# Patient Record
Sex: Female | Born: 1970 | Race: White | Hispanic: No | Marital: Married | Smoking: Current every day smoker
Health system: Southern US, Community
[De-identification: ages and names within clinical notes are randomized; demographics above are authoritative.]

## PROBLEM LIST (undated history)

## (undated) DIAGNOSIS — E119 Type 2 diabetes mellitus without complications: Secondary | ICD-10-CM

## (undated) DIAGNOSIS — G4730 Sleep apnea, unspecified: Secondary | ICD-10-CM

## (undated) DIAGNOSIS — M1990 Unspecified osteoarthritis, unspecified site: Secondary | ICD-10-CM

## (undated) DIAGNOSIS — M797 Fibromyalgia: Secondary | ICD-10-CM

## (undated) DIAGNOSIS — C9210 Chronic myeloid leukemia, BCR/ABL-positive, not having achieved remission: Principal | ICD-10-CM

## (undated) DIAGNOSIS — R0600 Dyspnea, unspecified: Secondary | ICD-10-CM

## (undated) DIAGNOSIS — G709 Myoneural disorder, unspecified: Secondary | ICD-10-CM

## (undated) DIAGNOSIS — Z8719 Personal history of other diseases of the digestive system: Secondary | ICD-10-CM

## (undated) DIAGNOSIS — D649 Anemia, unspecified: Secondary | ICD-10-CM

## (undated) DIAGNOSIS — K219 Gastro-esophageal reflux disease without esophagitis: Secondary | ICD-10-CM

## (undated) DIAGNOSIS — T7840XA Allergy, unspecified, initial encounter: Secondary | ICD-10-CM

## (undated) DIAGNOSIS — F419 Anxiety disorder, unspecified: Secondary | ICD-10-CM

## (undated) DIAGNOSIS — Z87442 Personal history of urinary calculi: Secondary | ICD-10-CM

## (undated) DIAGNOSIS — F32A Depression, unspecified: Secondary | ICD-10-CM

## (undated) DIAGNOSIS — J45909 Unspecified asthma, uncomplicated: Secondary | ICD-10-CM

## (undated) DIAGNOSIS — J449 Chronic obstructive pulmonary disease, unspecified: Secondary | ICD-10-CM

## (undated) DIAGNOSIS — J189 Pneumonia, unspecified organism: Secondary | ICD-10-CM

## (undated) DIAGNOSIS — F329 Major depressive disorder, single episode, unspecified: Secondary | ICD-10-CM

## (undated) DIAGNOSIS — E785 Hyperlipidemia, unspecified: Secondary | ICD-10-CM

## (undated) HISTORY — DX: Unspecified osteoarthritis, unspecified site: M19.90

## (undated) HISTORY — DX: Myoneural disorder, unspecified: G70.9

## (undated) HISTORY — DX: Chronic myeloid leukemia, BCR/ABL-positive, not having achieved remission: C92.10

## (undated) HISTORY — DX: Chronic obstructive pulmonary disease, unspecified: J44.9

## (undated) HISTORY — DX: Unspecified asthma, uncomplicated: J45.909

## (undated) HISTORY — PX: BACK SURGERY: SHX140

## (undated) HISTORY — DX: Allergy, unspecified, initial encounter: T78.40XA

## (undated) HISTORY — DX: Hyperlipidemia, unspecified: E78.5

## (undated) HISTORY — DX: Type 2 diabetes mellitus without complications: E11.9

---
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
## 2003-03-02 HISTORY — PX: TUBAL LIGATION: SHX77

## 2006-03-01 HISTORY — PX: CARPAL TUNNEL RELEASE: SHX101

## 2009-04-23 DIAGNOSIS — G4733 Obstructive sleep apnea (adult) (pediatric): Secondary | ICD-10-CM

## 2009-04-23 DIAGNOSIS — Z9989 Dependence on other enabling machines and devices: Secondary | ICD-10-CM

## 2010-12-23 DIAGNOSIS — I10 Essential (primary) hypertension: Secondary | ICD-10-CM | POA: Insufficient documentation

## 2011-02-04 DIAGNOSIS — Z87891 Personal history of nicotine dependence: Secondary | ICD-10-CM | POA: Insufficient documentation

## 2011-02-04 DIAGNOSIS — G4733 Obstructive sleep apnea (adult) (pediatric): Secondary | ICD-10-CM | POA: Insufficient documentation

## 2011-03-28 DIAGNOSIS — K589 Irritable bowel syndrome without diarrhea: Secondary | ICD-10-CM | POA: Insufficient documentation

## 2012-03-04 ENCOUNTER — Encounter: Payer: Self-pay | Admitting: Emergency Medicine

## 2012-09-18 DIAGNOSIS — G990 Autonomic neuropathy in diseases classified elsewhere: Secondary | ICD-10-CM | POA: Insufficient documentation

## 2013-01-12 LAB — HM PAP SMEAR: HM Pap smear: NEGATIVE

## 2013-03-01 HISTORY — PX: KIDNEY STONE SURGERY: SHX686

## 2013-05-08 DIAGNOSIS — J449 Chronic obstructive pulmonary disease, unspecified: Secondary | ICD-10-CM | POA: Insufficient documentation

## 2013-10-07 DIAGNOSIS — M5126 Other intervertebral disc displacement, lumbar region: Secondary | ICD-10-CM | POA: Insufficient documentation

## 2013-12-08 DIAGNOSIS — N201 Calculus of ureter: Secondary | ICD-10-CM | POA: Insufficient documentation

## 2014-01-13 LAB — HM MAMMOGRAPHY

## 2014-02-01 DIAGNOSIS — E538 Deficiency of other specified B group vitamins: Secondary | ICD-10-CM | POA: Insufficient documentation

## 2014-02-01 DIAGNOSIS — F17210 Nicotine dependence, cigarettes, uncomplicated: Secondary | ICD-10-CM | POA: Insufficient documentation

## 2014-03-19 DIAGNOSIS — M5116 Intervertebral disc disorders with radiculopathy, lumbar region: Secondary | ICD-10-CM | POA: Insufficient documentation

## 2015-07-23 DIAGNOSIS — B009 Herpesviral infection, unspecified: Secondary | ICD-10-CM | POA: Insufficient documentation

## 2015-12-16 ENCOUNTER — Ambulatory Visit: Payer: Self-pay | Admitting: Osteopathic Medicine

## 2015-12-24 ENCOUNTER — Ambulatory Visit (INDEPENDENT_AMBULATORY_CARE_PROVIDER_SITE_OTHER): Payer: 59 | Admitting: Osteopathic Medicine

## 2015-12-24 ENCOUNTER — Encounter: Payer: Self-pay | Admitting: Osteopathic Medicine

## 2015-12-24 VITALS — BP 135/83 | HR 84 | Ht 65.0 in | Wt 261.0 lb

## 2015-12-24 DIAGNOSIS — L509 Urticaria, unspecified: Secondary | ICD-10-CM | POA: Diagnosis not present

## 2015-12-24 DIAGNOSIS — R05 Cough: Secondary | ICD-10-CM | POA: Insufficient documentation

## 2015-12-24 DIAGNOSIS — L299 Pruritus, unspecified: Secondary | ICD-10-CM | POA: Insufficient documentation

## 2015-12-24 DIAGNOSIS — J45909 Unspecified asthma, uncomplicated: Secondary | ICD-10-CM | POA: Diagnosis not present

## 2015-12-24 DIAGNOSIS — R059 Cough, unspecified: Secondary | ICD-10-CM

## 2015-12-24 DIAGNOSIS — J322 Chronic ethmoidal sinusitis: Secondary | ICD-10-CM | POA: Insufficient documentation

## 2015-12-24 DIAGNOSIS — B379 Candidiasis, unspecified: Secondary | ICD-10-CM | POA: Insufficient documentation

## 2015-12-24 DIAGNOSIS — B9689 Other specified bacterial agents as the cause of diseases classified elsewhere: Secondary | ICD-10-CM

## 2015-12-24 DIAGNOSIS — E119 Type 2 diabetes mellitus without complications: Secondary | ICD-10-CM | POA: Diagnosis not present

## 2015-12-24 DIAGNOSIS — J329 Chronic sinusitis, unspecified: Secondary | ICD-10-CM

## 2015-12-24 DIAGNOSIS — R053 Chronic cough: Secondary | ICD-10-CM | POA: Insufficient documentation

## 2015-12-24 DIAGNOSIS — R350 Frequency of micturition: Secondary | ICD-10-CM | POA: Insufficient documentation

## 2015-12-24 DIAGNOSIS — E1165 Type 2 diabetes mellitus with hyperglycemia: Secondary | ICD-10-CM | POA: Insufficient documentation

## 2015-12-24 LAB — CBC WITH DIFFERENTIAL/PLATELET
BASOS ABS: 116 {cells}/uL (ref 0–200)
Basophils Relative: 1 %
EOS ABS: 116 {cells}/uL (ref 15–500)
Eosinophils Relative: 1 %
HEMATOCRIT: 43.2 % (ref 35.0–45.0)
HEMOGLOBIN: 13.4 g/dL (ref 11.7–15.5)
LYMPHS ABS: 1972 {cells}/uL (ref 850–3900)
LYMPHS PCT: 17 %
MCH: 27.4 pg (ref 27.0–33.0)
MCHC: 31 g/dL — AB (ref 32.0–36.0)
MCV: 88.3 fL (ref 80.0–100.0)
MONO ABS: 348 {cells}/uL (ref 200–950)
MPV: 8.9 fL (ref 7.5–12.5)
Monocytes Relative: 3 %
NEUTROS PCT: 78 %
Neutro Abs: 9048 cells/uL — ABNORMAL HIGH (ref 1500–7800)
Platelets: 243 10*3/uL (ref 140–400)
RBC: 4.89 MIL/uL (ref 3.80–5.10)
RDW: 15.5 % — AB (ref 11.0–15.0)
WBC: 11.6 10*3/uL — ABNORMAL HIGH (ref 3.8–10.8)

## 2015-12-24 LAB — POCT GLYCOSYLATED HEMOGLOBIN (HGB A1C): HEMOGLOBIN A1C: 9.5

## 2015-12-24 MED ORDER — CETIRIZINE HCL 10 MG PO TABS: 10.0000 mg | ORAL_TABLET | Freq: Every day | ORAL | 6 refills | Status: DC

## 2015-12-24 MED ORDER — AMOXICILLIN-POT CLAVULANATE 875-125 MG PO TABS: 1.0000 | ORAL_TABLET | Freq: Two times a day (BID) | ORAL | 0 refills | Status: DC

## 2015-12-24 MED ORDER — FLUCONAZOLE 150 MG PO TABS: 150.0000 mg | ORAL_TABLET | Freq: Once | ORAL | 1 refills | Status: AC

## 2015-12-24 MED ORDER — ALBUTEROL SULFATE HFA 108 (90 BASE) MCG/ACT IN AERS: 1.0000 | INHALATION_SPRAY | RESPIRATORY_TRACT | 12 refills | Status: AC | PRN

## 2015-12-24 NOTE — Progress Notes (Signed)
HPI: Tabitha Estrada is a 45 y.o. female  who presents to [facility] today, 12/24/15,  for chief complaint of:  Chief Complaint  Patient presents with  . Establish Care    Skin itching: diffuse, no rash, intermittent. No triggers identified. Better in the car and has padding on her bed to keep cool which helps a bit When she is lying down at night. Benadryl helps, lotion and gold bond and cortisone don't help.   Sinus Congestion - since 08/2015, was mentioned to previous doctor. Was seen for UTI, given antibiotic to cover both thing (Levaquin). Nasonex was drying nose out. She'll use on occasion. No other systemic antihistamine  ?COPD vs asthma - seen by Pulmonologist last year, states that there was some questionable diagnosis of COPD versus asthma, patient has nebulized medication at home, unknown which medicine. Has albuterol inhaler which she uses on occasion, needs refill. Occasional SOB. Occasional wheezing and chest tightness. Pneumonia last year. Current smoker. 72 PY Hx. Declines chest x-ray today  Vaginal irritation/urinary frequency: Patient requests urine checked today as well as labs  Past medical, surgical, social and family history reviewed: Past Medical History:  Diagnosis Date  . Arthritis   . Asthma   . Diabetes mellitus without complication ([place])   . Hyperlipidemia    Past Surgical History:  Procedure Laterality Date  . BACK SURGERY  2015,2016  . CARPAL TUNNEL RELEASE Right 2008  . CHOLECYSTECTOMY  1992  . KIDNEY STONE SURGERY  2015  . TONSILLECTOMY  1979  . TUBAL LIGATION  2005   Social History  Substance Use Topics  . Smoking status: Current Every Day Smoker  . Smokeless tobacco: Never Used  . Alcohol use Not on file   Family History  Problem Relation Age of Onset  . Diabetes Father   . Hyperlipidemia Father   . Hypertension Father   . Diabetes Paternal Aunt   . Hypertension Paternal Aunt   . Heart attack  Paternal Uncle   . Emphysema Maternal Grandmother   . Heart failure Maternal Grandmother   . Cancer Maternal Grandfather      Current medication list and allergy/intolerance information reviewed:   Current Outpatient Prescriptions  Medication Sig Dispense Refill  . atorvastatin (LIPITOR) 40 MG tablet Take by mouth.    . Glucose Blood (BLOOD GLUCOSE TEST STRIPS) STRP 1 each by Other route 3 (three) times a day. Use as instructed    . Insulin Degludec 200 UNIT/ML SOPN Inject into the skin.    . Insulin Lispro 200 UNIT/ML SOPN Inject into the skin.    . Insulin Pen Needle 32G X 4 MM MISC by Does not apply route.    Marland Kitchen omeprazole (PRILOSEC) 20 MG capsule TAKE 1 CAPSULE ONCE DAILY    . valACYclovir (VALTREX) 500 MG tablet Take by mouth.    Marland Kitchen albuterol (PROVENTIL HFA;VENTOLIN HFA) 108 (90 Base) MCG/ACT inhaler Inhale 1-2 puffs into the lungs every 4 (four) hours as needed for wheezing or shortness of breath. 1 Inhaler 12  . amoxicillin-clavulanate (AUGMENTIN) 875-125 MG tablet Take 1 tablet by mouth 2 (two) times daily. For 7 days 14 tablet 0  . cetirizine (ZYRTEC) 10 MG tablet Take 1 tablet (10 mg total) by mouth daily. 30 tablet 6  . fluconazole (DIFLUCAN) 150 MG tablet Take 1 tablet (150 mg total) by mouth once. Repeat dose 72 hours if no better 2 tablet 1   No current facility-administered medications for this visit.  Allergies  Allergen Reactions  . Dulaglutide Other (See Comments)  . Sulfur Nausea And Vomiting  . Metformin And Related Diarrhea      Review of Systems:  Constitutional:  No  fever, no chills, No recent illness, No unintentional weight changes. No significant fatigue.   HEENT: +occasional headache, no vision change, no hearing change, No sore throat, +sinus pressure  Cardiac: No  chest pain, No  pressure, No palpitations, No  Orthopnea  Respiratory:  No  shortness of breath. +Cough  Gastrointestinal: No  abdominal pain, No  nausea, No  vomiting,  No  blood in  stool, No  diarrhea, No  constipation   Musculoskeletal: No new myalgia/arthralgia  Genitourinary: No  incontinence, No  abnormal genital bleeding, No abnormal genital discharge  Skin: No  Rash, No other wounds/concerning lesions  Hem/Onc: No  easy bruising/bleeding, No  abnormal lymph node  Endocrine: No cold intolerance,  No heat intolerance. No polyuria/polydipsia/polyphagia   Neurologic: No  weakness, No  dizziness, No  slurred speech/focal weakness/facial droop  Psychiatric: No  concerns with depression, No  concerns with anxiety, No sleep problems, No mood problems  Exam:  BP 135/83   Pulse 84   Ht 5\' 5"  (1.651 m)   Wt 261 lb (118.4 kg)   BMI 43.43 kg/m   Constitutional: VS see above. General Appearance: alert, well-developed, well-nourished, NAD  Eyes: Normal lids and conjunctive, non-icteric sclera  Ears, Nose, Mouth, Throat: MMM, Normal external inspection ears/nares/mouth/lips/gums. TM normal bilaterally. Pharynx/tonsils no erythema, no exudate. Nasal mucosa normal. No sinus tenderness to palpation or percussion.  Neck: No masses, trachea midline. No thyroid enlargement. No tenderness/mass appreciated. No lymphadenopathy  Respiratory: Normal respiratory effort. + Diffuse bilateral wheeze, no rhonchi, no rales  Cardiovascular: S1/S2 normal, no murmur, no rub/gallop auscultated. RRR. No lower extremity edema.   Musculoskeletal: Gait normal. No clubbing/cyanosis of digits.   Neurological: Normal balance/coordination. No tremor.   Skin: warm, dry, intact. No rash/ulcer.     Psychiatric: Normal judgment/insight. Normal mood and affect. Oriented x3.      ASSESSMENT/PLAN: Constellation of likely allergic/reactive airway issues but cannot rule out sinusitis versus allergic rhinitis, patient declines chest x-ray today but exam consistent with asthma, return for PFT and in the meantime albuterol. Patient reports no significant breathing change over the past week or 2 to  make a concern for an exacerbation, we'll hold off on prednisone for now given elevated blood sugars as well the patient is advised to call me if feeling worse. [facility] records from previous pulmonology visit for PFT, but we'll repeat PFTs here either way, given smoking history there is of course concern for COPD.  Cough - Plan: cetirizine (ZYRTEC) 10 MG tablet, CBC with Differential/Platelet, COMPLETE METABOLIC PANEL WITH GFR, TSH  Type 2 diabetes mellitus without complication, without long-term current use of insulin (HCC) - Plan: POCT HgB A1C  Urticaria - Plan: cetirizine (ZYRTEC) 10 MG tablet, CBC with Differential/Platelet, COMPLETE METABOLIC PANEL WITH GFR, TSH  Reactive airway disease without complication, unspecified asthma severity, unspecified whether persistent - Plan: albuterol (PROVENTIL HFA;VENTOLIN HFA) 108 (90 Base) MCG/ACT inhaler  Bacterial sinusitis - Plan: amoxicillin-clavulanate (AUGMENTIN) 875-125 MG tablet  Yeast infection - Plan: fluconazole (DIFLUCAN) 150 MG tablet  Urinary frequency - Plan: Urinalysis, Urine culture    Patient Instructions  Plan:  Cough/Asthma  - refill albuterol inhaler, use 2 - 4 times per day as needed - come back for lung function test - chest xray today was recommended that we  have chosen to hold off on this for now  Itching/Congestion  - Allegra/Zrtec/Claritin or their generic equivalents  Congestion/Sinus - antibiotics, if this doesn't help we may need imaging (Ct head) to look at sinuses internally     Visit summary with medication list and pertinent instructions was printed for patient to review. All questions at time of visit were answered - patient instructed to contact office with any additional concerns. ER/RTC precautions were reviewed with the patient. Follow-up plan: Return in about 2 weeks (around 01/07/2016) for PFT and recheck symptoms (OV30), sooner if needed.

## 2015-12-24 NOTE — Patient Instructions (Addendum)
Plan:  Cough/Asthma  - refill albuterol inhaler, use 2 - 4 times per day as needed - come back for lung function test - chest xray today was recommended that we have chosen to hold off on this for now  Itching/Congestion  - Allegra/Zrtec/Claritin or their generic equivalents  Congestion/Sinus - antibiotics, if this doesn't help we may need imaging (Ct head) to look at sinuses internally

## 2015-12-25 LAB — URINALYSIS
BILIRUBIN URINE: NEGATIVE
HGB URINE DIPSTICK: NEGATIVE
Ketones, ur: NEGATIVE
Nitrite: NEGATIVE
PROTEIN: NEGATIVE
Specific Gravity, Urine: 1.018 (ref 1.001–1.035)
pH: 6 (ref 5.0–8.0)

## 2015-12-25 LAB — TSH: TSH: 2.21 m[IU]/L

## 2015-12-25 LAB — COMPLETE METABOLIC PANEL WITH GFR
ALBUMIN: 3.9 g/dL (ref 3.6–5.1)
ALK PHOS: 113 U/L (ref 33–115)
ALT: 13 U/L (ref 6–29)
AST: 11 U/L (ref 10–30)
BUN: 11 mg/dL (ref 7–25)
CALCIUM: 9.2 mg/dL (ref 8.6–10.2)
CHLORIDE: 102 mmol/L (ref 98–110)
CO2: 27 mmol/L (ref 20–31)
CREATININE: 0.87 mg/dL (ref 0.50–1.10)
GFR, Est Non African American: 81 mL/min (ref >=60)
Glucose, Bld: 190 mg/dL — ABNORMAL HIGH (ref 65–99)
POTASSIUM: 4.6 mmol/L (ref 3.5–5.3)
Sodium: 140 mmol/L (ref 135–146)
Total Bilirubin: 0.4 mg/dL (ref 0.2–1.2)
Total Protein: 6.4 g/dL (ref 6.1–8.1)

## 2015-12-30 ENCOUNTER — Encounter: Payer: Self-pay | Admitting: Osteopathic Medicine

## 2016-01-02 ENCOUNTER — Telehealth: Payer: Self-pay

## 2016-01-02 DIAGNOSIS — N390 Urinary tract infection, site not specified: Secondary | ICD-10-CM

## 2016-01-02 NOTE — Telephone Encounter (Signed)
noted 

## 2016-01-02 NOTE — Telephone Encounter (Signed)
Patient brought in a urine sample to run send off for urine culture.Rhonda Cunningham,CMA

## 2016-01-04 LAB — URINE CULTURE: Organism ID, Bacteria: NO GROWTH

## 2016-01-07 ENCOUNTER — Ambulatory Visit: Payer: 59 | Admitting: Osteopathic Medicine

## 2016-01-07 ENCOUNTER — Other Ambulatory Visit: Payer: 59

## 2016-01-27 ENCOUNTER — Encounter: Payer: Self-pay | Admitting: Osteopathic Medicine

## 2016-01-27 ENCOUNTER — Ambulatory Visit (INDEPENDENT_AMBULATORY_CARE_PROVIDER_SITE_OTHER): Payer: 59 | Admitting: Osteopathic Medicine

## 2016-01-27 ENCOUNTER — Ambulatory Visit (INDEPENDENT_AMBULATORY_CARE_PROVIDER_SITE_OTHER): Payer: 59

## 2016-01-27 VITALS — BP 122/81 | HR 84 | Temp 98.2°F | Ht 65.0 in | Wt 257.0 lb

## 2016-01-27 DIAGNOSIS — J209 Acute bronchitis, unspecified: Secondary | ICD-10-CM | POA: Diagnosis not present

## 2016-01-27 DIAGNOSIS — R05 Cough: Secondary | ICD-10-CM

## 2016-01-27 DIAGNOSIS — R51 Headache: Secondary | ICD-10-CM | POA: Diagnosis not present

## 2016-01-27 DIAGNOSIS — R059 Cough, unspecified: Secondary | ICD-10-CM

## 2016-01-27 DIAGNOSIS — R0602 Shortness of breath: Secondary | ICD-10-CM | POA: Diagnosis not present

## 2016-01-27 DIAGNOSIS — R519 Headache, unspecified: Secondary | ICD-10-CM

## 2016-01-27 DIAGNOSIS — J4541 Moderate persistent asthma with (acute) exacerbation: Secondary | ICD-10-CM | POA: Diagnosis not present

## 2016-01-27 DIAGNOSIS — R062 Wheezing: Secondary | ICD-10-CM | POA: Diagnosis not present

## 2016-01-27 MED ORDER — DOXYCYCLINE HYCLATE 100 MG PO TABS: 100.0000 mg | ORAL_TABLET | Freq: Two times a day (BID) | ORAL | 0 refills | Status: DC

## 2016-01-27 MED ORDER — MONTELUKAST SODIUM 10 MG PO TABS: 10.0000 mg | ORAL_TABLET | Freq: Every day | ORAL | 3 refills | Status: DC

## 2016-01-27 MED ORDER — IPRATROPIUM-ALBUTEROL 0.5-2.5 (3) MG/3ML IN SOLN: 3.0000 mL | RESPIRATORY_TRACT | 3 refills | Status: DC | PRN

## 2016-01-27 MED ORDER — IPRATROPIUM BROMIDE 0.03 % NA SOLN: 2.0000 | Freq: Three times a day (TID) | NASAL | 1 refills | Status: DC | PRN

## 2016-01-27 MED ORDER — PREDNISONE 20 MG PO TABS: 20.0000 mg | ORAL_TABLET | Freq: Two times a day (BID) | ORAL | 1 refills | Status: DC

## 2016-01-27 MED ORDER — FLUCONAZOLE 150 MG PO TABS: 150.0000 mg | ORAL_TABLET | Freq: Once | ORAL | 1 refills | Status: AC

## 2016-01-27 NOTE — Progress Notes (Signed)
HPI: Tabitha Estrada is a 45 y.o. female  who presents to [facility] today, 01/27/16,  for chief complaint of:  Chief Complaint  Patient presents with  . Cough    Acute Illness  . Context: Previously following with pulmonology. No records available.Recent treatment for sinusitis, antibiotics were helpful for relief of symptoms but this illness is new. . Quality: Cough, sinus pressure, ear pain/fullness . Duration: 1-2 weeks . Modifying factors: Intermittent use of albuterol. No chronic inhaler use. Patient states that they've been having mold problems, family has not dealt with this. . Assoc signs/symptoms: coughing, phlegm production, headache   Past medical, surgical, social and family history reviewed: Patient Active Problem List   Diagnosis Date Noted  . Cough 12/24/2015  . Type 2 diabetes mellitus without complication, without long-term current use of insulin ([place]) 12/24/2015  . Urticaria 12/24/2015  . Reactive airway disease without complication 0000000  . Ethmoid sinusitis 12/24/2015  . Yeast infection 12/24/2015  . Urinary frequency 12/24/2015   Past Surgical History:  Procedure Laterality Date  . BACK SURGERY  2015,2016  . CARPAL TUNNEL RELEASE Right 2008  . CHOLECYSTECTOMY  1992  . KIDNEY STONE SURGERY  2015  . TONSILLECTOMY  1979  . TUBAL LIGATION  2005   Social History  Substance Use Topics  . Smoking status: Current Every Day Smoker  . Smokeless tobacco: Never Used  . Alcohol use Not on file   Family History  Problem Relation Age of Onset  . Diabetes Father   . Hyperlipidemia Father   . Hypertension Father   . Diabetes Paternal Aunt   . Hypertension Paternal Aunt   . Heart attack Paternal Uncle   . Emphysema Maternal Grandmother   . Heart failure Maternal Grandmother   . Cancer Maternal Grandfather      Current medication list and allergy/intolerance information reviewed:   Current Outpatient Prescriptions  on File Prior to Visit  Medication Sig Dispense Refill  . albuterol (PROVENTIL HFA;VENTOLIN HFA) 108 (90 Base) MCG/ACT inhaler Inhale 1-2 puffs into the lungs every 4 (four) hours as needed for wheezing or shortness of breath. 1 Inhaler 12  . atorvastatin (LIPITOR) 40 MG tablet Take by mouth.    . cetirizine (ZYRTEC) 10 MG tablet Take 1 tablet (10 mg total) by mouth daily. 30 tablet 6  . Glucose Blood (BLOOD GLUCOSE TEST STRIPS) STRP 1 each by Other route 3 (three) times a day. Use as instructed    . Insulin Degludec 200 UNIT/ML SOPN Inject into the skin.    . Insulin Lispro 200 UNIT/ML SOPN Inject into the skin.    . Insulin Pen Needle 32G X 4 MM MISC by Does not apply route.    Marland Kitchen omeprazole (PRILOSEC) 20 MG capsule TAKE 1 CAPSULE ONCE DAILY    . valACYclovir (VALTREX) 500 MG tablet Take by mouth.     No current facility-administered medications on file prior to visit.    Allergies  Allergen Reactions  . Dulaglutide Other (See Comments)  . Sulfur Nausea And Vomiting  . Metformin And Related Diarrhea      Review of Systems:  Constitutional: +recent illness  HEENT: +headache, no vision change, +ear pain/fullness, +sore throat mild  Cardiac: No  chest pain, No  pressure, No palpitations  Respiratory:  No  shortness of breath. +Cough  Gastrointestinal: No  abdominal pain, no change on bowel habits  Musculoskeletal: No new myalgia/arthralgia  Skin: No  Rash    Exam:  BP 122/81   Pulse 84   Temp 98.2 F (36.8 C) (Oral)   Ht 5\' 5"  (1.651 m)   Wt 257 lb (116.6 kg)   BMI 42.77 kg/m   Constitutional: VS see above. General Appearance: alert, well-developed, well-nourished, NAD  Eyes: Normal lids and conjunctive, non-icteric sclera  Ears, Nose, Mouth, Throat: MMM, Normal external inspection ears/nares/mouth/lips/gums. Tm normal bilaterally with scant clear effusion, no pharyngeal erythema/exudate  Neck: No masses, trachea midline.   Respiratory: Normal respiratory  effort. + diffuse wheeze, no rhonchi, no rales  Cardiovascular: S1/S2 normal, no murmur, no rub/gallop auscultated. RRR.   Musculoskeletal: Gait normal. Symmetric and independent movement of all extremities  Neurological: Normal balance/coordination. No tremor.  Skin: warm, dry, intact.   Psychiatric: Normal judgment/insight. Normal mood and affect. Oriented x3.    X-ray images personally reviewed with patient. No concerning infiltrate or mass, question bronchial markings. Radiology over read as below  Dg Chest 2 View  Result Date: 01/27/2016 CLINICAL DATA:  Shortness of breath, wheezing and cough over the last 2-3 weeks. EXAM: CHEST  2 VIEW COMPARISON:  None. FINDINGS: Heart size is normal. Mediastinal shadows are normal. There is mild bronchial thickening but there is no infiltrate, collapse or effusion. No bony abnormality. IMPRESSION: Bronchitis pattern.  No consolidation or collapse. Electronically Signed   By: Nelson Chimes M.D.   On: 01/27/2016 15:19     ASSESSMENT/PLAN: Bronchitis/bacterial sinusitis, trial doxycycline as below. Patient advised that she needs to come in for PFT, she is currently on the schedule for later this week to have that done, may need to hold off until breathing has returned to baseline for complete evaluation, alternatively can refer to pulmonology but patient states that she will come here to get PFTs done. I suspect that she will need to be on a controller inhaler. For now, DuoNeb nebulizer medications written, adding Singulair to her regimen as well. Patient advised that landlord needs to address the mold issue in the home as this may certainly be contributing to symptoms/exacerbations. Short burst steroids as below.  Acute bronchitis, unspecified organism  Cough - Plan: DG Chest 2 View, doxycycline (VIBRA-TABS) 100 MG tablet  Moderate persistent asthma with acute exacerbation - Plan: predniSONE (DELTASONE) 20 MG tablet, ipratropium-albuterol (DUONEB)  0.5-2.5 (3) MG/3ML SOLN, montelukast (SINGULAIR) 10 MG tablet, fluconazole (DIFLUCAN) 150 MG tablet, ipratropium (ATROVENT) 0.03 % nasal spray  Acute nonintractable headache, unspecified headache type - Toradol IM given in office      Visit summary with medication list and pertinent instructions was printed for patient to review. All questions at time of visit were answered - patient instructed to contact office with any additional concerns. ER/RTC precautions were reviewed with the patient. Follow-up plan: Return if symptoms worsen or fail to improve, and as directed for PFT.

## 2016-01-28 MED ORDER — KETOROLAC TROMETHAMINE 60 MG/2ML IM SOLN
60.0000 mg | Freq: Once | INTRAMUSCULAR | Status: AC
  Administered 2016-01-28: 60 mg via INTRAMUSCULAR

## 2016-01-28 NOTE — Addendum Note (Signed)
Addended by: Doree Albee on: 01/28/2016 08:49 AM   Modules accepted: Orders

## 2016-01-29 ENCOUNTER — Other Ambulatory Visit: Payer: 59

## 2016-02-09 ENCOUNTER — Ambulatory Visit (INDEPENDENT_AMBULATORY_CARE_PROVIDER_SITE_OTHER): Payer: 59 | Admitting: Osteopathic Medicine

## 2016-02-09 VITALS — BP 131/84 | HR 96 | Ht 65.0 in | Wt 262.0 lb

## 2016-02-09 DIAGNOSIS — R600 Localized edema: Secondary | ICD-10-CM

## 2016-02-09 DIAGNOSIS — R942 Abnormal results of pulmonary function studies: Secondary | ICD-10-CM | POA: Diagnosis not present

## 2016-02-09 DIAGNOSIS — R059 Cough, unspecified: Secondary | ICD-10-CM

## 2016-02-09 DIAGNOSIS — R05 Cough: Secondary | ICD-10-CM

## 2016-02-09 DIAGNOSIS — Z23 Encounter for immunization: Secondary | ICD-10-CM | POA: Diagnosis not present

## 2016-02-09 DIAGNOSIS — M5416 Radiculopathy, lumbar region: Secondary | ICD-10-CM

## 2016-02-09 MED ORDER — CYCLOBENZAPRINE HCL 10 MG PO TABS: 10.0000 mg | ORAL_TABLET | Freq: Three times a day (TID) | ORAL | 0 refills | Status: DC | PRN

## 2016-02-09 MED ORDER — ONDANSETRON 8 MG PO TBDP: 8.0000 mg | ORAL_TABLET | Freq: Three times a day (TID) | ORAL | 0 refills | Status: DC | PRN

## 2016-02-09 MED ORDER — ALBUTEROL SULFATE (2.5 MG/3ML) 0.083% IN NEBU
2.5000 mg | INHALATION_SOLUTION | Freq: Once | RESPIRATORY_TRACT | Status: AC
  Administered 2016-02-09: 2.5 mg via RESPIRATORY_TRACT

## 2016-02-09 MED ORDER — NAPROXEN 500 MG PO TABS: 500.0000 mg | ORAL_TABLET | Freq: Two times a day (BID) | ORAL | 0 refills | Status: DC

## 2016-02-09 MED ORDER — OXYCODONE-ACETAMINOPHEN 5-325 MG PO TABS: 1.0000 | ORAL_TABLET | Freq: Three times a day (TID) | ORAL | 0 refills | Status: DC | PRN

## 2016-02-09 NOTE — Patient Instructions (Addendum)
Would recommend a follow-up with pulmonology, Dr. Raenette Rover, for further lung testing to evaluate breathing function/give opinion regarding diagnosis and medication management.  We have called in a referral to neurosurgery for second opinion. You should be getting a call to set up an appointment downstairs. He also placed referral for physical therapy to help with acute flare of back pain. Take anti-inflammatory, naproxen, as directed daily, in addition can use the oxycodone or the cyclobenzaprine muscle relaxer. If numbness/weakness get significantly worse, please let me know right away. If numbness between the thighs develops +/- urinary/stool incontinence, please proceed to emergency room right away.  Swelling on the right side in addition to the calf pain is suspicious for possible DVT, you have declined ultrasound for further workup of this issue at this point, but if you change your mind about this, please let me know

## 2016-02-09 NOTE — Progress Notes (Signed)
HPI: Tabitha Estrada is a 45 y.o. female  who presents to [facility] today, 02/09/16,  for chief complaint of:  Chief Complaint  Patient presents with  . Cough    Back pain . Context: Long-standing history of lumbar radiculopathy, audible back surgeries/disc procedures. . Location: Low back on right, radiating down right thigh . Quality: Pain/spasm . Duration: Flare seems to be worse past few days . Timing: Worse with movement . Modifying factors: previous back pain surgeries 09/2013, 03/2014 - bulging discs repair. Lately taking Oxycodone, which was left over from kidney stone. . Assoc signs/symptoms: Chronic right lower extremity numbness/weakness  Persistent cough  . Quality: Dry cough . Duration: Several months . Context: History of asthma, previously following with pulmonology . Modifying factors: Inhaled medications as noted below . Assoc signs/symptoms: Wheezing, no significant shortness of breath Pulmonary function test: Borderline results today, no obvious subjective component, apparent restrictive component    Patient is accompanied by mom who assists with history-taking.   Past medical, surgical, social and family history reviewed: Patient Active Problem List   Diagnosis Date Noted  . Cough 12/24/2015  . Type 2 diabetes mellitus without complication, without long-term current use of insulin ([place]) 12/24/2015  . Urticaria 12/24/2015  . Reactive airway disease without complication 0000000  . Ethmoid sinusitis 12/24/2015  . Yeast infection 12/24/2015  . Urinary frequency 12/24/2015   Past Surgical History:  Procedure Laterality Date  . BACK SURGERY  2015,2016  . CARPAL TUNNEL RELEASE Right 2008  . CHOLECYSTECTOMY  1992  . KIDNEY STONE SURGERY  2015  . TONSILLECTOMY  1979  . TUBAL LIGATION  2005   Social History  Substance Use Topics  . Smoking status: Current Every Day Smoker  . Smokeless tobacco: Never Used  . Alcohol  use Not on file   Family History  Problem Relation Age of Onset  . Diabetes Father   . Hyperlipidemia Father   . Hypertension Father   . Diabetes Paternal Aunt   . Hypertension Paternal Aunt   . Heart attack Paternal Uncle   . Emphysema Maternal Grandmother   . Heart failure Maternal Grandmother   . Cancer Maternal Grandfather      Current medication list and allergy/intolerance information reviewed:   Current Outpatient Prescriptions on File Prior to Visit  Medication Sig Dispense Refill  . albuterol (PROVENTIL HFA;VENTOLIN HFA) 108 (90 Base) MCG/ACT inhaler Inhale 1-2 puffs into the lungs every 4 (four) hours as needed for wheezing or shortness of breath. 1 Inhaler 12  . atorvastatin (LIPITOR) 40 MG tablet Take by mouth.    . Glucose Blood (BLOOD GLUCOSE TEST STRIPS) STRP 1 each by Other route 3 (three) times a day. Use as instructed    . Insulin Degludec 200 UNIT/ML SOPN Inject into the skin.    . Insulin Lispro 200 UNIT/ML SOPN Inject into the skin.    . Insulin Pen Needle 32G X 4 MM MISC by Does not apply route.    Marland Kitchen ipratropium (ATROVENT) 0.03 % nasal spray Place 2 sprays into both nostrils 3 (three) times daily as needed for rhinitis. 30 mL 1  . ipratropium-albuterol (DUONEB) 0.5-2.5 (3) MG/3ML SOLN Take 3 mLs by nebulization every 2 (two) hours as needed (wheeze, SOB). 60 mL 3  . montelukast (SINGULAIR) 10 MG tablet Take 1 tablet (10 mg total) by mouth at bedtime. 30 tablet 3  . omeprazole (PRILOSEC) 20 MG capsule TAKE 1 CAPSULE ONCE DAILY    .  valACYclovir (VALTREX) 500 MG tablet Take by mouth.     No current facility-administered medications on file prior to visit.    Allergies  Allergen Reactions  . Dulaglutide Other (See Comments)  . Sulfur Nausea And Vomiting  . Metformin And Related Diarrhea      Review of Systems:  Constitutional: No recent illness  HEENT: No  headache, no vision change  Cardiac: No  chest pain, No  pressure, No  palpitations  Respiratory:  No  shortness of breath. +Cough, +wheeze  Gastrointestinal: No  abdominal pain, no change on bowel habits  Musculoskeletal: No new myalgia/arthralgia  Skin: No  Rash  Hem/Onc: No  easy bruising/bleeding, No  abnormal lumps/bumps  Neurologic: No  weakness, No  Dizziness  Psychiatric: No  concerns with depression, No  concerns with anxiety  Exam:  BP 131/84   Pulse 96   Ht 5\' 5"  (1.651 m)   Wt 262 lb (118.8 kg)   BMI 43.60 kg/m   Constitutional: VS see above. General Appearance: alert, well-developed, well-nourished, NAD  Eyes: Normal lids and conjunctive, non-icteric sclera  Ears, Nose, Mouth, Throat: MMM, Normal external inspection ears/nares/mouth/lips/gums.  Neck: No masses, trachea midline.   Respiratory: Normal respiratory effort. + bilateral wheeze at bases mild, no rhonchi, no rales  Cardiovascular: S1/S2 normal, no murmur, no rub/gallop auscultated. RRR. Trace to (+)1 edema RLE  Musculoskeletal: Gait antalgic favoring R leg, diminished strength to dorsiflexion and plantar flexion on right side, diminshed sensation in distribution of L4-L5 dermatome on R  Skin: warm, dry, intact.   Psychiatric: Normal judgment/insight. Normal mood and affect. Oriented x3.      PFT INTERPRETATION  FEV1/FVC >70 *AND*  FEV1 >80% predicted  = NORMAL SPIROMETRY NORMAL? no  1. Valid study? yes 2. Flow-Volume Loop: normal 3. FEV1/FVC: 86% predicted  >70 = Normal 3. Severity of Obstruction ~ Post-Bronchodilator FEV1: 2% change 5. Bronchodilator Challenge  Increase FEV1 or FVC by 200+mL? no *AND*  Increased same FEV1 or FVC by 12+%? no  Reversible? no 6. FVC <80% = Restrictive yes 78% pred 7. Lung Volumes  TLC, VC, RV Normal = 80-120%  TLC <80% = Restrictive  TLC >120% = Hyperinflation  RV >120% = Air Trapping  RV <80% = RLD or OLD  VC <80% = RLD or OLD 8. Diffusion Capacity - refer to Pulm needed? yes If suspect interstitial lung  disease     ASSESSMENT/PLAN:    Abnormal pulmonary function test - Patient advised to reestablish with pulmonology, may need diffusion capacity testing  Cough - Plan: albuterol (PROVENTIL) (2.5 MG/3ML) 0.083% nebulizer solution 2.5 mg, Spirometry: Pre & Post Eval  Lumbar radiculopathy - Patient requests referral to neurosurgery. Limited number of opiate prescriptions as well as NSAIDs/muscle relaxer and PT referral for me - Plan: Ambulatory referral to Neurosurgery, Ambulatory referral to Physical Therapy  Need for prophylactic vaccination and inoculation against influenza - Plan: Flu Vaccine QUAD 36+ mos PF IM (Fluarix & Fluzone Quad PF)  Leg edema, right - Borderline Well's score, edema +/- dependendent, score 0-2, I advised [place] to r/o DVT but patient decliened. Precautions reviewed.     Patient Instructions  Would recommend a follow-up with pulmonology, Dr. Raenette Rover, for further lung testing to evaluate breathing function/give opinion regarding diagnosis and medication management.  We have called in a referral to neurosurgery for second opinion. You should be getting a call to set up an appointment downstairs. He also placed referral for physical therapy to help with acute flare  of back pain. Take anti-inflammatory, naproxen, as directed daily, in addition can use the oxycodone or the cyclobenzaprine muscle relaxer. If numbness/weakness get significantly worse, please let me know right away. If numbness between the thighs develops +/- urinary/stool incontinence, please proceed to emergency room right away.  Swelling on the right side in addition to the calf pain is suspicious for possible DVT, you have declined ultrasound for further workup of this issue at this point, but if you change your mind about this, please let me know    Visit summary with medication list and pertinent instructions was printed for patient to review. All questions at time of visit were answered - patient  instructed to contact office with any additional concerns. ER/RTC precautions were reviewed with the patient. Follow-up plan: Return for annual physical when due, diabetes follow-up in 2 months.  Note: Total time spent 40 minutes, greater than 50% of the visit was spent face-to-face counseling and coordinating care for the following: The primary encounter diagnosis was Abnormal pulmonary function test. Diagnoses of Cough, Lumbar radiculopathy, Need for prophylactic vaccination and inoculation against influenza, and Leg edema, right were also pertinent to this visit.Marland Kitchen

## 2016-02-09 NOTE — Progress Notes (Signed)
Patient came into clinic today for spirometry. Pt reports she is trying to determine if she has asthma or COPD. Pt reports she has been coughing and wheezing for "a long time." Pt completed spirometry well, no immediate complications. Pt was placed in a room to go over results with PCP.

## 2016-03-08 ENCOUNTER — Other Ambulatory Visit: Payer: Self-pay | Admitting: Neurosurgery

## 2016-03-08 DIAGNOSIS — M5441 Lumbago with sciatica, right side: Secondary | ICD-10-CM

## 2016-03-15 ENCOUNTER — Ambulatory Visit (INDEPENDENT_AMBULATORY_CARE_PROVIDER_SITE_OTHER): Payer: 59

## 2016-03-15 DIAGNOSIS — Z9889 Other specified postprocedural states: Secondary | ICD-10-CM | POA: Diagnosis not present

## 2016-03-15 DIAGNOSIS — M4807 Spinal stenosis, lumbosacral region: Secondary | ICD-10-CM | POA: Diagnosis not present

## 2016-03-15 DIAGNOSIS — M5441 Lumbago with sciatica, right side: Secondary | ICD-10-CM

## 2016-03-15 DIAGNOSIS — M5117 Intervertebral disc disorders with radiculopathy, lumbosacral region: Secondary | ICD-10-CM

## 2016-03-15 MED ORDER — GADOBENATE DIMEGLUMINE 529 MG/ML IV SOLN
20.0000 mL | Freq: Once | INTRAVENOUS | Status: AC | PRN
  Administered 2016-03-15: 20 mL via INTRAVENOUS

## 2016-03-21 ENCOUNTER — Encounter: Payer: Self-pay | Admitting: Osteopathic Medicine

## 2016-03-21 DIAGNOSIS — E559 Vitamin D deficiency, unspecified: Secondary | ICD-10-CM | POA: Insufficient documentation

## 2016-03-24 ENCOUNTER — Other Ambulatory Visit: Payer: Self-pay | Admitting: Neurosurgery

## 2016-04-12 ENCOUNTER — Ambulatory Visit (INDEPENDENT_AMBULATORY_CARE_PROVIDER_SITE_OTHER): Payer: 59 | Admitting: Osteopathic Medicine

## 2016-04-12 ENCOUNTER — Encounter: Payer: Self-pay | Admitting: Osteopathic Medicine

## 2016-04-12 VITALS — BP 136/79 | HR 86 | Ht 65.0 in | Wt 267.0 lb

## 2016-04-12 DIAGNOSIS — B379 Candidiasis, unspecified: Secondary | ICD-10-CM

## 2016-04-12 DIAGNOSIS — J440 Chronic obstructive pulmonary disease with acute lower respiratory infection: Secondary | ICD-10-CM

## 2016-04-12 DIAGNOSIS — E11628 Type 2 diabetes mellitus with other skin complications: Secondary | ICD-10-CM | POA: Diagnosis not present

## 2016-04-12 DIAGNOSIS — Z419 Encounter for procedure for purposes other than remedying health state, unspecified: Secondary | ICD-10-CM | POA: Diagnosis not present

## 2016-04-12 DIAGNOSIS — B3731 Acute candidiasis of vulva and vagina: Secondary | ICD-10-CM

## 2016-04-12 DIAGNOSIS — B373 Candidiasis of vulva and vagina: Secondary | ICD-10-CM | POA: Diagnosis not present

## 2016-04-12 DIAGNOSIS — J209 Acute bronchitis, unspecified: Secondary | ICD-10-CM | POA: Diagnosis not present

## 2016-04-12 DIAGNOSIS — Z794 Long term (current) use of insulin: Secondary | ICD-10-CM | POA: Diagnosis not present

## 2016-04-12 LAB — POCT UA - MICROALBUMIN
Creatinine, POC: 200 mg/dL
MICROALBUMIN (UR) POC: 150 mg/L

## 2016-04-12 LAB — POCT URINALYSIS DIPSTICK
Bilirubin, UA: NEGATIVE
Glucose, UA: NEGATIVE
KETONES UA: NEGATIVE
Nitrite, UA: NEGATIVE
PH UA: 5.5
SPEC GRAV UA: 1.02
UROBILINOGEN UA: 0.2

## 2016-04-12 LAB — POCT GLYCOSYLATED HEMOGLOBIN (HGB A1C): HEMOGLOBIN A1C: 11.4

## 2016-04-12 MED ORDER — FLUCONAZOLE 150 MG PO TABS: 150.0000 mg | ORAL_TABLET | Freq: Once | ORAL | 1 refills | Status: AC

## 2016-04-12 MED ORDER — AZITHROMYCIN 250 MG PO TABS: ORAL_TABLET | ORAL | 0 refills | Status: DC

## 2016-04-12 MED ORDER — NYSTATIN POWD: 1 refills | Status: DC

## 2016-04-12 MED ORDER — PREDNISONE 20 MG PO TABS: 20.0000 mg | ORAL_TABLET | Freq: Two times a day (BID) | ORAL | 0 refills | Status: DC

## 2016-04-12 NOTE — Patient Instructions (Addendum)
Plan:  Split Tresiba to 40 units twice daily, can increase by 1 unit at a time (41 + 41, etc) twice per week until fasting sugars are around 120  Dose meal-time insulin according to your pre-meal sugars: your usual dose is 25 units, you'll take an additional # units based on sugars as below:  Sugars  Take additional X units  <150   0  150-199  2  200-249  4  250-299  6    300-349  8  >350   10  Keep track of sugars fasting and pre-meal, and bring these records to your next visit in a few weeks

## 2016-04-12 NOTE — Progress Notes (Signed)
HPI: Tabitha Estrada is a 46 y.o. female  who presents to [facility] today, 04/12/16,  for chief complaint of:  Chief Complaint  Patient presents with  . Follow-up    diabetes    DM2:  A1C worse today than 3 mos ago [place]: 80 units daily Humalog: 25 units every meal - has been ksipping a bit, not eating 3 meals a day Fasting Glc am 200s Pre-meals: not checking   Skin:  Redness and itching under breasts bilaterally  Vaginal: Thinks there may be yeast infection  Respiratory: Coughing, URI symptoms for about 3-4 days, home OTC meds not helping. (+)Hx asthma. Coughing, wheezing worse at night.       Past medical history, surgical history, social history and family history reviewed.  Patient Active Problem List   Diagnosis Date Noted  . Vitamin D deficiency 03/21/2016  . Cough 12/24/2015  . Type 2 diabetes mellitus without complication, without long-term current use of insulin ([facility]) 12/24/2015  . Urticaria 12/24/2015  . Reactive airway disease without complication 0000000  . Ethmoid sinusitis 12/24/2015  . Yeast infection 12/24/2015  . Urinary frequency 12/24/2015  . 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 Lumbar disc prolapse with root compression 03/19/2014  . 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 Vitamin B12 deficiency 02/01/2014  . Left ureteral stone 12/08/2013  . Lumbar disc herniation 10/07/2013  . Asthma 05/08/2013  . 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 IBS (irritable bowel syndrome) 03/28/2011  . History of tobacco use 02/04/2011  . Obstructive sleep apnea syndrome in adult 02/04/2011  . Hypertension 12/23/2010  . OSA on CPAP 04/23/2009    Current medication list and allergy/intolerance information reviewed.   Current Outpatient Prescriptions on File Prior to Visit  Medication Sig Dispense Refill  . albuterol (PROVENTIL HFA;VENTOLIN HFA) 108 (90 Base) MCG/ACT  inhaler Inhale 1-2 puffs into the lungs every 4 (four) hours as needed for wheezing or shortness of breath. 1 Inhaler 12  . atorvastatin (LIPITOR) 40 MG tablet Take by mouth.    . cyclobenzaprine (FLEXERIL) 10 MG tablet Take 1 tablet (10 mg total) by mouth 3 (three) times daily as needed for muscle spasms. 30 tablet 0  . gabapentin (NEURONTIN) 400 MG capsule Take 1,200 mg by mouth 2 (two) times daily.    . Glucose Blood (BLOOD GLUCOSE TEST STRIPS) STRP 1 each by Other route 3 (three) times a day. Use as instructed    . Insulin Degludec 200 UNIT/ML SOPN Inject into the skin.    . Insulin Lispro 200 UNIT/ML SOPN Inject into the skin.    . Insulin Pen Needle 32G X 4 MM MISC by Does not apply route.    Marland Kitchen ipratropium (ATROVENT) 0.03 % nasal spray Place 2 sprays into both nostrils 3 (three) times daily as needed for rhinitis. 30 mL 1  . ipratropium-albuterol (DUONEB) 0.5-2.5 (3) MG/3ML SOLN Take 3 mLs by nebulization every 2 (two) hours as needed (wheeze, SOB). 60 mL 3  . montelukast (SINGULAIR) 10 MG tablet Take 1 tablet (10 mg total) by mouth at bedtime. 30 tablet 3  . naproxen (NAPROSYN) 500 MG tablet Take 1 tablet (500 mg total) by mouth 2 (two) times daily with a meal. 30 tablet 0  . omeprazole (PRILOSEC) 20 MG capsule TAKE 1 CAPSULE ONCE DAILY    . ondansetron (ZOFRAN-ODT) 8 MG disintegrating tablet Take 1 tablet (8 mg total) by mouth every 8 (eight) hours as needed for nausea.  15 tablet 0  . oxyCODONE-acetaminophen (ROXICET) 5-325 MG tablet Take 1 tablet by mouth every 8 (eight) hours as needed for severe pain (Used sparingly to prevent tolerance/dependence). 10 tablet 0  . valACYclovir (VALTREX) 500 MG tablet Take by mouth.     No current facility-administered medications on file prior to visit.    Allergies  Allergen Reactions  . Dulaglutide Other (See Comments)  . Sulfur Nausea And Vomiting  . Metformin And Related Diarrhea      Review of Systems:  Constitutional: +recent  illness  HEENT: +headache, no vision change, +sinus pressure   Cardiac: No  chest pain, No  pressure, No palpitations  Respiratory:  +shortness of breath. +Cough  Gastrointestinal: No  abdominal pain, no change on bowel habits  Skin: +Rash  Exam:  BP 136/79   Pulse 86   Ht 5\' 5"  (1.651 m)   Wt 267 lb (121.1 kg)   BMI 44.43 kg/m   Constitutional: VS see above. General Appearance: alert, well-developed, well-nourished, NAD  Eyes: Normal lids and conjunctive, non-icteric sclera  Ears, Nose, Mouth, Throat: MMM, Normal external inspection ears/nares/mouth/lips/gums.  Neck: No masses, trachea midline.   Respiratory: Normal respiratory effort. +wheeze, no rhonchi, no rales  Cardiovascular: S1/S2 normal, no murmur, no rub/gallop auscultated. RRR.   Musculoskeletal: Gait normal. Symmetric and independent movement of all extremities  Neurological: Normal balance/coordination. No tremor.  Skin: warm, dry, intact. +erythematous rash below breasts  Psychiatric: Normal judgment/insight. Normal mood and affect. Oriented x3.    Recent Results (from the past 2160 hour(s))  POCT UA - Microalbumin     Status: Abnormal   Collection Time: 04/12/16  8:19 AM  Result Value Ref Range   Microalbumin Ur, POC 150 mg/L   Creatinine, POC 200 mg/dL   Albumin/Creatinine Ratio, Urine, POC 30-300   POCT HgB A1C     Status: Abnormal   Collection Time: 04/12/16  8:20 AM  Result Value Ref Range   Hemoglobin A1C 11.4   POCT Urinalysis Dipstick     Status: Abnormal   Collection Time: 04/12/16  8:22 AM  Result Value Ref Range   Color, UA YELLOW    Clarity, UA CLEAR    Glucose, UA NEGATIVE    Bilirubin, UA NEGATIVE    Ketones, UA NEGATIVE    Spec Grav, UA 1.020    Blood, UA SMALL    pH, UA 5.5    Protein, UA TRACE    Urobilinogen, UA 0.2    Nitrite, UA NEGATIVE    Leukocytes, UA small (1+) (A) Negative     Last A1C: 12/24/15 was 9.5%     ASSESSMENT/PLAN:   Type 2 diabetes mellitus  with other skin complication, with long-term current use of insulin ([facility]) - Plan: POCT HgB A1C, POCT UA - Microalbumin  Patient-requested procedure - Plan: POCT Urinalysis Dipstick  Acute bronchitis with COPD ([facility]CC) - Plan: azithromycin (ZITHROMAX Z-PAK) 250 MG tablet, predniSONE (DELTASONE) 20 MG tablet  Vaginal yeast infection - Plan: fluconazole (DIFLUCAN) 150 MG tablet, Urine Culture  Candida infection - Plan: Nystatin POWD    Patient Instructions  Plan:  Split Tresiba to 40 units twice daily, can increase by 1 unit at a time (41 + 41, etc) twice per week until fasting sugars are around 120  Dose meal-time insulin according to your pre-meal sugars: your usual dose is 25 units, you'll take an additional # units based on sugars as below:  Sugars  Take additional X units  <150   0  150-199  2  200-249  4  250-299  6    300-349  8  >350   10  Keep track of sugars fasting and pre-meal, and bring these records to your next visit in a few weeks         Follow-up plan: Return in about 2 weeks (around 04/26/2016) for diabetes recheck.  Visit summary with medication list and pertinent instructions was printed for patient to review, alert [place] if any changes needed. All questions at time of visit were answered - patient instructed to contact office with any additional concerns. ER/RTC precautions were reviewed with the patient and understanding verbalized.

## 2016-04-14 LAB — URINE CULTURE: Organism ID, Bacteria: NO GROWTH

## 2016-04-20 ENCOUNTER — Telehealth: Payer: Self-pay

## 2016-04-20 NOTE — Telephone Encounter (Signed)
Patient called stated that she was seen for itching and she says it has gotten worse. Please advise patient. Brodee Mauritz,CMA

## 2016-04-20 NOTE — Telephone Encounter (Signed)
I sent in nystatin powder to use on the red itchy tissue under the breasts, she also was complaining of a yeast infection I sent medication for this. Please confirm that she has taken these medications, and can she please be more specific about what exactly is itching and how it is worse.

## 2016-04-23 ENCOUNTER — Ambulatory Visit (INDEPENDENT_AMBULATORY_CARE_PROVIDER_SITE_OTHER): Payer: 59 | Admitting: Osteopathic Medicine

## 2016-04-23 ENCOUNTER — Encounter: Payer: Self-pay | Admitting: Osteopathic Medicine

## 2016-04-23 VITALS — BP 159/71 | HR 93 | Ht 65.0 in | Wt 262.0 lb

## 2016-04-23 DIAGNOSIS — L299 Pruritus, unspecified: Secondary | ICD-10-CM

## 2016-04-23 DIAGNOSIS — L501 Idiopathic urticaria: Secondary | ICD-10-CM

## 2016-04-23 LAB — CBC WITH DIFFERENTIAL/PLATELET
BASOS PCT: 2 %
Basophils Absolute: 290 cells/uL — ABNORMAL HIGH (ref 0–200)
EOS ABS: 145 {cells}/uL (ref 15–500)
EOS PCT: 1 %
HCT: 42.1 % (ref 35.0–45.0)
Hemoglobin: 13.4 g/dL (ref 11.7–15.5)
LYMPHS PCT: 15 %
Lymphs Abs: 2175 cells/uL (ref 850–3900)
MCH: 27.4 pg (ref 27.0–33.0)
MCHC: 31.8 g/dL — ABNORMAL LOW (ref 32.0–36.0)
MCV: 86.1 fL (ref 80.0–100.0)
MONOS PCT: 3 %
MPV: 9.3 fL (ref 7.5–12.5)
Monocytes Absolute: 435 cells/uL (ref 200–950)
Neutro Abs: 11455 cells/uL — ABNORMAL HIGH (ref 1500–7800)
Neutrophils Relative %: 79 %
PLATELETS: 286 10*3/uL (ref 140–400)
RBC: 4.89 MIL/uL (ref 3.80–5.10)
RDW: 16.9 % — AB (ref 11.0–15.0)
WBC: 14.5 10*3/uL — AB (ref 3.8–10.8)

## 2016-04-23 MED ORDER — CETIRIZINE HCL 10 MG PO TABS: 10.0000 mg | ORAL_TABLET | Freq: Two times a day (BID) | ORAL | 2 refills | Status: DC

## 2016-04-23 MED ORDER — GABAPENTIN 400 MG PO CAPS: 1200.0000 mg | ORAL_CAPSULE | Freq: Three times a day (TID) | ORAL | 5 refills | Status: DC | PRN

## 2016-04-23 NOTE — Progress Notes (Signed)
HPI: Tabitha Estrada is a 46 y.o. female  who presents to [facility] today, 04/23/16,  for chief complaint of:  Chief Complaint  Patient presents with  . Pruritis    SKIN     . Location: across abdomen, large patch on R scapular area . Quality: itching/burning, no rash . Timing: worse in evening but constant . Modifying factors: gabapentin, topical steroids, benadryl haven't helped; ice helps.     Past medical history, surgical history, social history and family history reviewed.  Patient Active Problem List   Diagnosis Date Noted  . Vitamin D deficiency 03/21/2016  . Cough 12/24/2015  . Type 2 diabetes mellitus with hyperglycemia ([facility]) 12/24/2015  . Urticaria 12/24/2015  . Reactive airway disease without complication 0000000  . Ethmoid sinusitis 12/24/2015  . Yeast infection 12/24/2015  . Urinary frequency 12/24/2015  . 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 Lumbar disc prolapse with root compression 03/19/2014  . 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 Vitamin B12 deficiency 02/01/2014  . Left ureteral stone 12/08/2013  . Lumbar disc herniation 10/07/2013  . Asthma 05/08/2013  . 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 IBS (irritable bowel syndrome) 03/28/2011  . History of tobacco use 02/04/2011  . Obstructive sleep apnea syndrome in adult 02/04/2011  . Hypertension 12/23/2010  . OSA on CPAP 04/23/2009    Current medication list and allergy/intolerance information reviewed.   Current Outpatient Prescriptions on File Prior to Visit  Medication Sig Dispense Refill  . albuterol (PROVENTIL HFA;VENTOLIN HFA) 108 (90 Base) MCG/ACT inhaler Inhale 1-2 puffs into the lungs every 4 (four) hours as needed for wheezing or shortness of breath. 1 Inhaler 12  . atorvastatin (LIPITOR) 40 MG tablet Take by mouth.    . cyclobenzaprine (FLEXERIL) 10 MG tablet Take 1 tablet (10 mg total) by mouth 3  (three) times daily as needed for muscle spasms. 30 tablet 0  . gabapentin (NEURONTIN) 400 MG capsule Take 1,200 mg by mouth 2 (two) times daily.    . Glucose Blood (BLOOD GLUCOSE TEST STRIPS) STRP 1 each by Other route 3 (three) times a day. Use as instructed    . Insulin Degludec 200 UNIT/ML SOPN Inject into the skin.    . Insulin Lispro 200 UNIT/ML SOPN Inject into the skin.    . Insulin Pen Needle 32G X 4 MM MISC by Does not apply route.    Marland Kitchen ipratropium (ATROVENT) 0.03 % nasal spray Place 2 sprays into both nostrils 3 (three) times daily as needed for rhinitis. 30 mL 1  . ipratropium-albuterol (DUONEB) 0.5-2.5 (3) MG/3ML SOLN Take 3 mLs by nebulization every 2 (two) hours as needed (wheeze, SOB). 60 mL 3  . montelukast (SINGULAIR) 10 MG tablet Take 1 tablet (10 mg total) by mouth at bedtime. 30 tablet 3  . naproxen (NAPROSYN) 500 MG tablet Take 1 tablet (500 mg total) by mouth 2 (two) times daily with a meal. 30 tablet 0  . Nystatin POWD Apply liberally to affected area 2 times per day 1 Bottle 1  . omeprazole (PRILOSEC) 20 MG capsule TAKE 1 CAPSULE ONCE DAILY    . ondansetron (ZOFRAN-ODT) 8 MG disintegrating tablet Take 1 tablet (8 mg total) by mouth every 8 (eight) hours as needed for nausea. 15 tablet 0  . oxyCODONE-acetaminophen (ROXICET) 5-325 MG tablet Take 1 tablet by mouth every 8 (eight) hours as needed for severe pain (Used sparingly to prevent tolerance/dependence). 10 tablet 0  .  valACYclovir (VALTREX) 500 MG tablet Take by mouth.     No current facility-administered medications on file prior to visit.    Allergies  Allergen Reactions  . Dulaglutide Other (See Comments)  . Sulfur Nausea And Vomiting  . Metformin And Related Diarrhea      Review of Systems:  Constitutional: No recent illness  HEENT: No  headache  Cardiac: No  chest pain  Respiratory:  No  shortness of breath  Gastrointestinal: No  abdominal pain  Musculoskeletal: No new  myalgia/arthralgia  Skin: No  Rash I narea of itching, +rash under breasts getting better w/ nystatin powder  Hem/Onc: No  abnormal lumps/bumps   Exam:  BP (!) 159/71   Pulse 93   Ht 5\' 5"  (1.651 m)   Wt 262 lb (118.8 kg)   BMI 43.60 kg/m   Constitutional: VS see above. General Appearance: alert, well-developed, well-nourished, NAD  Neck: No masses, trachea midline.   Respiratory: Normal respiratory effort.   Musculoskeletal: Gait normal. Symmetric and independent movement of all extremities  Neurological: Normal balance/coordination. No tremor.  Skin: warm, dry, intact. No rash in areas of concern  Psychiatric: Normal judgment/insight. Normal mood and affect. Oriented x3.     ASSESSMENT/PLAN: Relatively new problem, workup as below. No rash, seems unlikely nerve pain or shingles given distribution and crosses midline, idiopathic urticaria or other pruritic condition. Trial high dose cetirizine, avoid opiates, stop steroid cream. Can trial max dose gabapentin - Rx sent  Chronic idiopathic urticaria - Plan: Food Allergy Panel, CBC with 3 part Differential, cetirizine (ZYRTEC) 10 MG tablet, Ambulatory referral to Dermatology  Pruritic condition - Plan: Food Allergy Panel, CBC with 3 part Differential, cetirizine (ZYRTEC) 10 MG tablet, Ambulatory referral to Dermatology   Follow-up plan: Return if symptoms worsen/change or fail to improve or if rash develope.  Visit summary with medication list and pertinent instructions was printed for patient to review, alert [place] if any changes needed. All questions at time of visit were answered - patient instructed to contact office with any additional concerns. ER/RTC precautions were reviewed with the patient and understanding verbalized.

## 2016-04-24 LAB — CBC WITH 3 PART DIFFERENTIAL
BASOS PCT: 2 %
Basophils Absolute: 290 cells/uL — ABNORMAL HIGH (ref 0–200)
EOS ABS: 145 {cells}/uL (ref 15–500)
EOS PCT: 1 %
HEMATOCRIT: 42.1 % (ref 35.0–45.0)
Hemoglobin: 13.4 g/dL (ref 11.7–15.5)
Lymphocytes Relative: 15 %
Lymphs Abs: 2175 cells/uL (ref 850–3900)
MCH: 27.4 pg (ref 27.0–33.0)
MCHC: 31.8 g/dL — AB (ref 32.0–36.0)
MCV: 86.1 fL (ref 80.0–100.0)
MONO ABS: 435 {cells}/uL (ref 200–950)
MPV: 9.3 fL (ref 7.5–12.5)
Monocytes Relative: 3 %
Neutro Abs: 11455 cells/uL — ABNORMAL HIGH (ref 1500–7800)
Neutrophils Relative %: 79 %
PLATELETS: 286 10*3/uL (ref 140–400)
RBC: 4.89 MIL/uL (ref 3.80–5.10)
RDW: 16.9 % — AB (ref 11.0–15.0)
WBC: 14.5 10*3/uL — AB (ref 3.8–10.8)

## 2016-04-26 LAB — FOOD ALLERGY PANEL
Corn: <0.1 kU/L
Milk IgE: <0.1 kU/L
Peanut IgE: <0.1 kU/L
Shrimp IgE: <0.1 kU/L
Soybean IgE: <0.1 kU/L
Wheat IgE: <0.1 kU/L

## 2016-04-26 LAB — PATHOLOGIST SMEAR REVIEW

## 2016-04-27 ENCOUNTER — Encounter: Payer: Self-pay | Admitting: Osteopathic Medicine

## 2016-04-30 ENCOUNTER — Telehealth: Payer: Self-pay | Admitting: Osteopathic Medicine

## 2016-04-30 DIAGNOSIS — L299 Pruritus, unspecified: Secondary | ICD-10-CM

## 2016-04-30 DIAGNOSIS — D72829 Elevated white blood cell count, unspecified: Secondary | ICD-10-CM

## 2016-04-30 MED ORDER — DOXYCYCLINE MONOHYDRATE 100 MG PO CAPS: 100.0000 mg | ORAL_CAPSULE | Freq: Two times a day (BID) | ORAL | 0 refills | Status: DC

## 2016-04-30 NOTE — Telephone Encounter (Signed)
Called to discuss labs. Itching still present. Sinus congestion still a problem. We'll treat for sinusitis, hopefully this is source of leukocytosis though does not explain pruritus. Patient has appointment next week, advised come in to get labs 1-2 days prior to visit

## 2016-05-04 LAB — CBC WITH DIFFERENTIAL/PLATELET
Basophils Absolute: 173 cells/uL (ref 0–200)
Basophils Relative: 1 %
EOS PCT: 1 %
Eosinophils Absolute: 173 cells/uL (ref 15–500)
HCT: 41.1 % (ref 35.0–45.0)
HEMOGLOBIN: 12.9 g/dL (ref 11.7–15.5)
LYMPHS ABS: 2422 {cells}/uL (ref 850–3900)
Lymphocytes Relative: 14 %
MCH: 26.8 pg — ABNORMAL LOW (ref 27.0–33.0)
MCHC: 31.4 g/dL — AB (ref 32.0–36.0)
MCV: 85.4 fL (ref 80.0–100.0)
MPV: 9.3 fL (ref 7.5–12.5)
Monocytes Absolute: 519 cells/uL (ref 200–950)
Monocytes Relative: 3 %
NEUTROS ABS: 14013 {cells}/uL — AB (ref 1500–7800)
Neutrophils Relative %: 81 %
PLATELETS: 284 10*3/uL (ref 140–400)
RBC: 4.81 MIL/uL (ref 3.80–5.10)
RDW: 16.9 % — ABNORMAL HIGH (ref 11.0–15.0)
WBC: 17.3 10*3/uL — AB (ref 3.8–10.8)

## 2016-05-04 LAB — COMPLETE METABOLIC PANEL WITH GFR
ALBUMIN: 3.6 g/dL (ref 3.6–5.1)
ALK PHOS: 98 U/L (ref 33–115)
ALT: 15 U/L (ref 6–29)
AST: 12 U/L (ref 10–30)
BUN: 10 mg/dL (ref 7–25)
CO2: 29 mmol/L (ref 20–31)
Calcium: 9.2 mg/dL (ref 8.6–10.2)
Chloride: 100 mmol/L (ref 98–110)
Creat: 0.78 mg/dL (ref 0.50–1.10)
GFR, Est African American: >89 mL/min (ref >=60)
GLUCOSE: 282 mg/dL — AB (ref 65–99)
POTASSIUM: 4.2 mmol/L (ref 3.5–5.3)
SODIUM: 138 mmol/L (ref 135–146)
TOTAL PROTEIN: 6.1 g/dL (ref 6.1–8.1)
Total Bilirubin: 0.2 mg/dL (ref 0.2–1.2)

## 2016-05-04 LAB — HIV ANTIBODY (ROUTINE TESTING W REFLEX): HIV 1&2 Ab, 4th Generation: NONREACTIVE

## 2016-05-04 LAB — TSH: TSH: 2.94 mIU/L

## 2016-05-04 NOTE — Addendum Note (Signed)
Addended by: Maryla Morrow on: 05/04/2016 08:14 AM   Modules accepted: Orders

## 2016-05-05 ENCOUNTER — Encounter: Payer: Self-pay | Admitting: Osteopathic Medicine

## 2016-05-05 ENCOUNTER — Ambulatory Visit (INDEPENDENT_AMBULATORY_CARE_PROVIDER_SITE_OTHER): Payer: 59 | Admitting: Osteopathic Medicine

## 2016-05-05 ENCOUNTER — Ambulatory Visit (INDEPENDENT_AMBULATORY_CARE_PROVIDER_SITE_OTHER): Payer: 59

## 2016-05-05 VITALS — BP 134/85 | HR 94 | Ht 65.0 in | Wt 263.0 lb

## 2016-05-05 DIAGNOSIS — L299 Pruritus, unspecified: Secondary | ICD-10-CM | POA: Diagnosis not present

## 2016-05-05 DIAGNOSIS — D72829 Elevated white blood cell count, unspecified: Secondary | ICD-10-CM

## 2016-05-05 DIAGNOSIS — J449 Chronic obstructive pulmonary disease, unspecified: Secondary | ICD-10-CM

## 2016-05-05 DIAGNOSIS — R5383 Other fatigue: Secondary | ICD-10-CM

## 2016-05-05 LAB — TSH: TSH: 2.6 m[IU]/L

## 2016-05-05 NOTE — Progress Notes (Signed)
HPI: Tabitha Estrada is a 46 y.o. female  who presents to [facility] today, 05/05/16,  for chief complaint of:  Chief Complaint  Patient presents with  . Follow-up    itching     . Context: WBC keep getting higher, recent tx for sinusitis, no improvement . Duration: several weeks at this point  . Modifying factors: nothing makes better or worse  . Assoc signs/symptoms:  Burning skin without rash, on abdomen  Tired - sleeping a lot   Pain in back/legs  Hot/cold but no fever  Sinus congestion  Itching and burning skin  Epigastric discomfort with eating - no acid reflux   Neck painful  =?arthritis     Past medical history, surgical history, social history and family history reviewed.  Patient Active Problem List   Diagnosis Date Noted  . Vitamin D deficiency 03/21/2016  . Cough 12/24/2015  . Type 2 diabetes mellitus with hyperglycemia ([place]) 12/24/2015  . Urticaria 12/24/2015  . Reactive airway disease without complication 17/61/6073  . Ethmoid sinusitis 12/24/2015  . Yeast infection 12/24/2015  . Urinary frequency 12/24/2015  . 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 Lumbar disc prolapse with root compression 03/19/2014  . 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 Vitamin B12 deficiency 02/01/2014  . Left ureteral stone 12/08/2013  . Lumbar disc herniation 10/07/2013  . Asthma 05/08/2013  . 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 IBS (irritable bowel syndrome) 03/28/2011  . History of tobacco use 02/04/2011  . Obstructive sleep apnea syndrome in adult 02/04/2011  . Hypertension 12/23/2010  . OSA on CPAP 04/23/2009    Current medication list and allergy/intolerance information reviewed.   Current Outpatient Prescriptions on File Prior to Visit  Medication Sig Dispense Refill  . albuterol (PROVENTIL HFA;VENTOLIN HFA) 108 (90 Base) MCG/ACT inhaler Inhale 1-2 puffs into the lungs  every 4 (four) hours as needed for wheezing or shortness of breath. 1 Inhaler 12  . atorvastatin (LIPITOR) 40 MG tablet Take by mouth.    . cetirizine (ZYRTEC) 10 MG tablet Take 1 tablet (10 mg total) by mouth 2 (two) times daily. 60 tablet 2  . cyclobenzaprine (FLEXERIL) 10 MG tablet Take 1 tablet (10 mg total) by mouth 3 (three) times daily as needed for muscle spasms. 30 tablet 0  . doxycycline (MONODOX) 100 MG capsule Take 1 capsule (100 mg total) by mouth 2 (two) times daily. 14 capsule 0  . gabapentin (NEURONTIN) 400 MG capsule Take 3 capsules (1,200 mg total) by mouth 3 (three) times daily as needed (neuropathic pain). Max dose 3600 mg /24 hours 270 capsule 5  . Glucose Blood (BLOOD GLUCOSE TEST STRIPS) STRP 1 each by Other route 3 (three) times a day. Use as instructed    . Insulin Degludec 200 UNIT/ML SOPN Inject into the skin.    . Insulin Lispro 200 UNIT/ML SOPN Inject into the skin.    . Insulin Pen Needle 32G X 4 MM MISC by Does not apply route.    Marland Kitchen ipratropium (ATROVENT) 0.03 % nasal spray Place 2 sprays into both nostrils 3 (three) times daily as needed for rhinitis. 30 mL 1  . ipratropium-albuterol (DUONEB) 0.5-2.5 (3) MG/3ML SOLN Take 3 mLs by nebulization every 2 (two) hours as needed (wheeze, SOB). 60 mL 3  . montelukast (SINGULAIR) 10 MG tablet Take 1 tablet (10 mg total) by mouth at bedtime. 30 tablet 3  . naproxen (NAPROSYN) 500 MG tablet Take 1 tablet (  500 mg total) by mouth 2 (two) times daily with a meal. 30 tablet 0  . Nystatin POWD Apply liberally to affected area 2 times per day 1 Bottle 1  . omeprazole (PRILOSEC) 20 MG capsule TAKE 1 CAPSULE ONCE DAILY    . ondansetron (ZOFRAN-ODT) 8 MG disintegrating tablet Take 1 tablet (8 mg total) by mouth every 8 (eight) hours as needed for nausea. 15 tablet 0  . oxyCODONE-acetaminophen (ROXICET) 5-325 MG tablet Take 1 tablet by mouth every 8 (eight) hours as needed for severe pain (Used sparingly to prevent tolerance/dependence).  10 tablet 0  . valACYclovir (VALTREX) 500 MG tablet Take by mouth.     No current facility-administered medications on file prior to visit.    Allergies  Allergen Reactions  . Dulaglutide Other (See Comments)  . Sulfur Nausea And Vomiting  . Metformin And Related Diarrhea      Review of Systems:  Constitutional: +recent illness  HEENT: No  headache, no vision change  Cardiac: No  chest pain, No  pressure, No palpitations  Respiratory:  No  shortness of breath. +chronic Cough  Gastrointestinal:  no change on bowel habits, no severe diarrhea  Musculoskeletal: +chronic myalgia/arthralgia  Skin: No  Rash  Hem/Onc: No  easy bruising/bleeding, No  abnormal lumps/bumps  Neurologic: No  Dizziness   Exam:  BP 134/85   Pulse 94   Ht _0  (1.651 m)   Wt 263 lb (119.3 kg)   BMI 43.77 kg/m   Constitutional: VS see above. General Appearance: alert, well-developed, well-nourished, NAD  Eyes: Normal lids and conjunctive, non-icteric sclera  Ears, Nose, Mouth, Throat: MMM, Normal external inspection ears/nares/mouth/lips/gums.  Neck: No masses, trachea midline.   Respiratory: Normal respiratory effort. +scattered wheeze, no rhonchi, no rales  Cardiovascular: S1/S2 normal, no murmur, no rub/gallop auscultated. RRR.   Musculoskeletal: Gait normal. Symmetric and independent movement of all extremities  Neurological: Normal balance/coordination. No tremor.  Skin: warm, dry, intact.   Psychiatric: Normal judgment/insight. Normal mood and affect. Oriented x3.    Recent Results (from the past 2160 hour(s))  POCT UA - Microalbumin     Status: Abnormal   Collection Time: 04/12/16  8:19 AM  Result Value Ref Range   Microalbumin Ur, POC 150 mg/L   Creatinine, POC 200 mg/dL   Albumin/Creatinine Ratio, Urine, POC 30-300   POCT HgB A1C     Status: Abnormal   Collection Time: 04/12/16  8:20 AM  Result Value Ref Range   Hemoglobin A1C 11.4   POCT Urinalysis Dipstick      Status: Abnormal   Collection Time: 04/12/16  8:22 AM  Result Value Ref Range   Color, UA YELLOW    Clarity, UA CLEAR    Glucose, UA NEGATIVE    Bilirubin, UA NEGATIVE    Ketones, UA NEGATIVE    Spec Grav, UA 1.020    Blood, UA SMALL    pH, UA 5.5    Protein, UA TRACE    Urobilinogen, UA 0.2    Nitrite, UA NEGATIVE    Leukocytes, UA small (1+) (A) Negative  Urine Culture     Status: None   Collection Time: 04/12/16 11:54 AM  Result Value Ref Range   Organism ID, Bacteria NO GROWTH   Food Allergy Panel     Status: None   Collection Time: 04/23/16  9:16 AM  Result Value Ref Range   Egg White IgE <0.10 kU/L   Milk IgE <0.10 kU/L   Fish Cod <0.10  kU/L   Wheat IgE <0.10 kU/L   Corn <0.10 kU/L   Peanut IgE <0.10 kU/L   Soybean IgE <0.10 kU/L   Shrimp IgE <0.10 kU/L   Clams <0.10 kU/L   Walnut <0.10 kU/L    Comment:   Footnotes:  (1)        ----------------------------------------------------      Class   Specific IgE (kU/L)   Level      ----------------------------------------------------      0       <0.10                 Absent or undetectable      0/1     0.10  -   0.34        Equivocal/Borderline      1       0.35  -   0.69        Low      2       0.70  -   3.49        Moderate      3       3.50  -  17.49        High      4       17.50 -  49.99        Very High      5       50.00 - 100.00        Very High      6       >100.00               Very High   CBC with 3 part Differential     Status: Abnormal   Collection Time: 04/23/16  9:16 AM  Result Value Ref Range   WBC 14.5 (H) 3.8 - 10.8 K/uL    Comment: Few atypical lymphocytes noted.   RBC 4.89 3.80 - 5.10 MIL/uL   Hemoglobin 13.4 11.7 - 15.5 g/dL   HCT 42.1 35.0 - 45.0 %   MCV 86.1 80.0 - 100.0 fL   MCH 27.4 27.0 - 33.0 pg   MCHC 31.8 (L) 32.0 - 36.0 g/dL   RDW 16.9 (H) 11.0 - 15.0 %   Platelets 286 140 - 400 K/uL   MPV 9.3 7.5 - 12.5 fL   Neutrophils Relative % 79 %   Neutro Abs 11,455 (H) 1,500 - 7,800  cells/uL   Lymphocytes Relative 15 %   Lymphs Abs 2,175 850 - 3,900 cells/uL   Monocytes Relative 3 %   Monocytes Absolute 435 200 - 950 cells/uL   Eosinophils Relative 1 %   Eosinophils Absolute 145 15 - 500 cells/uL   Basophils Relative 2 %   Basophils Absolute 290 (H) 0 - 200 cells/uL   Smear Review       Comment: Review of peripheral smear confirms automated results. Pending path review   CBC with Differential/Platelet     Status: Abnormal   Collection Time: 04/23/16  9:16 AM  Result Value Ref Range   WBC 14.5 (H) 3.8 - 10.8 K/uL   RBC 4.89 3.80 - 5.10 MIL/uL   Hemoglobin 13.4 11.7 - 15.5 g/dL   HCT 42.1 35.0 - 45.0 %   MCV 86.1 80.0 - 100.0 fL   MCH 27.4 27.0 - 33.0 pg   MCHC 31.8 (L) 32.0 - 36.0 g/dL   RDW 16.9 (H) 11.0 - 15.0 %   Platelets 286  140 - 400 K/uL   MPV 9.3 7.5 - 12.5 fL   Neutro Abs 11,455 (H) 1,500 - 7,800 cells/uL   Lymphs Abs 2,175 850 - 3,900 cells/uL   Monocytes Absolute 435 200 - 950 cells/uL   Eosinophils Absolute 145 15 - 500 cells/uL   Basophils Absolute 290 (H) 0 - 200 cells/uL   Neutrophils Relative % 79 %   Lymphocytes Relative 15 %   Monocytes Relative 3 %   Eosinophils Relative 1 %   Basophils Relative 2 %   Smear Review Criteria for review not met   Pathologist smear review     Status: None   Collection Time: 04/23/16  9:16 AM  Result Value Ref Range   Path Review SEE NOTE     Comment: Leukocytosis due to absolute granulocytosis. Myeloid population consists predominantly of mature segmented neutrophils with mild left shift.  Platelets are unremarkable. RBCs demonstrate slight polychromasia and anisocytosis. Reviewed by Francis Gaines Mammarappallil MD (Electronic Signature on File) 04/26/2016.   CBC with Differential/Platelet     Status: Abnormal   Collection Time: 05/03/16  3:03 PM  Result Value Ref Range   WBC 17.3 (H) 3.8 - 10.8 K/uL   RBC 4.81 3.80 - 5.10 MIL/uL   Hemoglobin 12.9 11.7 - 15.5 g/dL   HCT 41.1 35.0 - 45.0 %   MCV  85.4 80.0 - 100.0 fL   MCH 26.8 (L) 27.0 - 33.0 pg   MCHC 31.4 (L) 32.0 - 36.0 g/dL   RDW 16.9 (H) 11.0 - 15.0 %   Platelets 284 140 - 400 K/uL   MPV 9.3 7.5 - 12.5 fL   Neutro Abs 14,013 (H) 1,500 - 7,800 cells/uL   Lymphs Abs 2,422 850 - 3,900 cells/uL   Monocytes Absolute 519 200 - 950 cells/uL   Eosinophils Absolute 173 15 - 500 cells/uL   Basophils Absolute 173 0 - 200 cells/uL   Neutrophils Relative % 81 %   Lymphocytes Relative 14 %   Monocytes Relative 3 %   Eosinophils Relative 1 %   Basophils Relative 1 %   Smear Review Criteria for review not met   COMPLETE METABOLIC PANEL WITH GFR     Status: Abnormal   Collection Time: 05/03/16  3:03 PM  Result Value Ref Range   Sodium 138 135 - 146 mmol/L   Potassium 4.2 3.5 - 5.3 mmol/L   Chloride 100 98 - 110 mmol/L   CO2 29 20 - 31 mmol/L   Glucose, Bld 282 (H) 65 - 99 mg/dL   BUN 10 7 - 25 mg/dL   Creat 0.78 0.50 - 1.10 mg/dL   Total Bilirubin 0.2 0.2 - 1.2 mg/dL   Alkaline Phosphatase 98 33 - 115 U/L   AST 12 10 - 30 U/L   ALT 15 6 - 29 U/L   Total Protein 6.1 6.1 - 8.1 g/dL   Albumin 3.6 3.6 - 5.1 g/dL   Calcium 9.2 8.6 - 10.2 mg/dL   GFR, Est African American >89 >=60 mL/min   GFR, Est Non African American >89 >=60 mL/min  TSH     Status: None   Collection Time: 05/03/16  3:03 PM  Result Value Ref Range   TSH 2.94 mIU/L    Comment:   Reference Range   > or = 20 Years  0.40-4.50   Pregnancy Range First trimester  0.26-2.66 Second trimester 0.55-2.73 Third trimester  0.43-2.91     HIV antibody     Status: None  Collection Time: 05/03/16  3:03 PM  Result Value Ref Range   HIV 1&2 Ab, 4th Generation NONREACTIVE NONREACTIVE    Comment:   HIV-1 antigen and HIV-1/HIV-2 antibodies were not detected.  There is no laboratory evidence of HIV infection.   HIV-1/2 Antibody Diff        Not indicated. HIV-1 RNA, Qual TMA          Not indicated.     PLEASE NOTE: This information has been disclosed to you from  records whose confidentiality may be protected by state law. If your state requires such protection, then the state law prohibits you from making any further disclosure of the information without the specific written consent of the person to whom it pertains, or as otherwise permitted by law. A general authorization for the release of medical or other information is NOT sufficient for this purpose.   The performance of this assay has not been clinically validated in patients less than 26 years old.   For additional information please refer to http://education.quest[employer].com/faq/FAQ106.  (This link is being provided for informational/educational purposes only.)       No results found.  No flowsheet data found.  Depression screen [facility] 2/9 05/05/2016 12/24/2015  Decreased Interest 2 0  Down, Depressed, Hopeless 1 0  PHQ - 2 Score 3 0  Altered sleeping 3 -  Tired, decreased energy 3 -  Change in appetite 2 -  Feeling bad or failure about yourself  0 -  Trouble concentrating 1 -  Moving slowly or fidgety/restless 0 -  Suicidal thoughts 0 -  PHQ-9 Score 12 -      ASSESSMENT/PLAN:   Leukocytosis, unspecified type - Plan: DG Chest 2 View, Urine culture, TSH, Quantiferon tb gold assay (blood), Blood culture (routine single), Blood culture (routine single), Ambulatory referral to Hematology / Oncology   The primary encounter diagnosis was Leukocytosis, unspecified type. Diagnoses of Pruritus and Other fatigue were also pertinent to this visit.    Patient Instructions  PLAN:  Additional testing to rule out infection  Referral to hematologist to determine further workup if needed  Surgery may have to be postponed  Will call you once I have more information from the tests  Please call us if any questions!  You should hear back about your referral in the next day or so, please let us know if you don't get a call!    Follow-up plan: Return for further discussion  depending on results, please return if any changing or worsening symptoms .  Visit summary with medication list and pertinent instructions was printed for patient to review, alert [place] if any changes needed. All questions at time of visit were answered - patient instructed to contact office with any additional concerns. ER/RTC precautions were reviewed with the patient and understanding verbalized.

## 2016-05-05 NOTE — Patient Instructions (Signed)
PLAN:  Additional testing to rule out infection  Referral to hematologist to determine further workup if needed  Surgery may have to be postponed  Will call you once I have more information from the tests  Please call us if any questions!  You should hear back about your referral in the next day or so, please let us know if you don't get a call!

## 2016-05-05 NOTE — Addendum Note (Signed)
Addended by: Doree Albee on: 05/05/2016 08:52 AM   Modules accepted: Orders

## 2016-05-06 LAB — PATHOLOGIST SMEAR REVIEW

## 2016-05-07 LAB — URINE CULTURE

## 2016-05-07 LAB — QUANTIFERON TB GOLD ASSAY (BLOOD)
INTERFERON GAMMA RELEASE ASSAY: NEGATIVE
QUANTIFERON TB AG MINUS NIL: 0 [IU]/mL
Quantiferon Nil Value: 0.02 IU/mL

## 2016-05-07 MED ORDER — CEPHALEXIN 500 MG PO CAPS: 500.0000 mg | ORAL_CAPSULE | Freq: Four times a day (QID) | ORAL | 0 refills | Status: DC

## 2016-05-07 NOTE — Addendum Note (Signed)
Addended by: Maryla Morrow on: 05/07/2016 12:31 PM   Modules accepted: Orders

## 2016-05-12 LAB — CULTURE, BLOOD (SINGLE)
ORGANISM ID, BACTERIA: NO GROWTH
Organism ID, Bacteria: NO GROWTH

## 2016-05-12 NOTE — Pre-Procedure Instructions (Signed)
Tabitha Estrada  05/12/2016      [facility] [place], [place] - [address] [place] [address] Phone: [phone] Fax: [phone]    Your procedure is scheduled on Fri. Mar. 23 @ 0750 AM.  Report to [facility] [place] at [phone] A.M.  Call this number if you have problems the morning of surgery:  [phone]   Remember:  Do not eat food or drink liquids after midnight.  Take these medicines the morning of surgery with A SIP OF WATER albuterol inhaler, cetirizine (zyrtec), gabapentin (neurontin), ipratropium (atrovent inhaler), omeprazole (zofran), Oxycodone (roxicet), Valacyclovir (valtrex).  Stop taking and herbal medications or vitamins. NO Goody's powder, BC's, Ibuprofen, Advil, Aleve, Naproxen, Motrin   Do not wear jewelry, make-up or nail polish.  Do not wear lotions, powders, or perfumes, or deoderant.  Do not shave 48 hours prior to surgery.    Do not bring valuables to the hospital.  [facility] is not responsible for any belongings or valuables.  Contacts, dentures or bridgework may not be worn into surgery.  Leave your suitcase in the car.  After surgery it may be brought to your room.  For patients admitted to the hospital, discharge time will be determined by your treatment team.  Patients discharged the day of surgery will not be allowed to drive home.    Special instructions:    How to Manage Your Diabetes Before and After Surgery  Why is it important to control my blood sugar before and after surgery? . Improving blood sugar levels before and after surgery helps healing and can limit problems. . A way of improving blood sugar control is eating a healthy diet by: o  Eating less sugar and carbohydrates o  Increasing activity/exercise o  Talking with your doctor about reaching your blood sugar goals . High blood sugars (greater than 180 mg/dL) can raise your risk of infections and slow your recovery,  so you will need to focus on controlling your diabetes during the weeks before surgery. . Make sure that the doctor who takes care of your diabetes knows about your planned surgery including the date and location.  How do I manage my blood sugar before surgery? . Check your blood sugar at least 4 times a day, starting 2 days before surgery, to make sure that the level is not too high or low. o Check your blood sugar the morning of your surgery when you wake up and every 2 hours until you get to the Short Stay unit. . If your blood sugar is less than 70 mg/dL, you will need to treat for low blood sugar: o Do not take insulin. o Treat a low blood sugar (less than 70 mg/dL) with  cup of clear juice (cranberry or apple), 4 glucose tablets, OR glucose gel. o Recheck blood sugar in 15 minutes after treatment (to make sure it is greater than 70 mg/dL). If your blood sugar is not greater than 70 mg/dL on recheck, call [phone] for further instructions. . Report your blood sugar to the short stay nurse when you get to Short Stay.  . If you are admitted to the hospital after surgery: o Your blood sugar will be checked by the staff and you will probably be given insulin after surgery (instead of oral diabetes medicines) to make sure you have good blood sugar levels. o The goal for blood sugar control after surgery is 80-180 mg/dL.  WHAT DO I DO ABOUT MY DIABETES MEDICATION?   Marland Kitchen Do not take oral diabetes medicines (pills) the morning of surgery       . THE MORNING OF SURGERY, if blood sugar is greater than 220 mg/dl take 14 units of novolog insulin.  . The day of surgery, do not take other diabetes injectables, including Byetta (exenatide), Bydureon (exenatide ER), Victoza (liraglutide), or Trulicity (dulaglutide).  . If your CBG is greater than 220 mg/dL, you may take  of your sliding scale (correction) dose of insulin.   Reviewed and Endorsed by [facility] Patient Education  Committee, August 2015  Please read over the following fact sheets that you were given. Pain Booklet, Coughing and Deep Breathing, MRSA Information and Surgical Site Infection Prevention

## 2016-05-13 ENCOUNTER — Encounter (HOSPITAL_COMMUNITY)
Admission: RE | Admit: 2016-05-13 | Discharge: 2016-05-13 | Disposition: A | Discharge status: Home / Self Care | Payer: 59 | Source: Ambulatory Visit | Attending: Neurosurgery | Admitting: Neurosurgery

## 2016-05-13 ENCOUNTER — Encounter (HOSPITAL_COMMUNITY): Payer: Self-pay

## 2016-05-13 DIAGNOSIS — Z01810 Encounter for preprocedural cardiovascular examination: Secondary | ICD-10-CM | POA: Insufficient documentation

## 2016-05-13 DIAGNOSIS — Z01818 Encounter for other preprocedural examination: Secondary | ICD-10-CM | POA: Diagnosis not present

## 2016-05-13 DIAGNOSIS — M5127 Other intervertebral disc displacement, lumbosacral region: Secondary | ICD-10-CM | POA: Diagnosis not present

## 2016-05-13 HISTORY — DX: Sleep apnea, unspecified: G47.30

## 2016-05-13 HISTORY — DX: Anemia, unspecified: D64.9

## 2016-05-13 HISTORY — DX: Pneumonia, unspecified organism: J18.9

## 2016-05-13 HISTORY — DX: Personal history of other diseases of the digestive system: Z87.19

## 2016-05-13 HISTORY — DX: Fibromyalgia: M79.7

## 2016-05-13 HISTORY — DX: Gastro-esophageal reflux disease without esophagitis: K21.9

## 2016-05-13 HISTORY — DX: Anxiety disorder, unspecified: F41.9

## 2016-05-13 HISTORY — DX: Major depressive disorder, single episode, unspecified: F32.9

## 2016-05-13 HISTORY — DX: Depression, unspecified: F32.A

## 2016-05-13 HISTORY — DX: Personal history of urinary calculi: Z87.442

## 2016-05-13 HISTORY — DX: Dyspnea, unspecified: R06.00

## 2016-05-13 LAB — GLUCOSE, CAPILLARY: Glucose-Capillary: 217 mg/dL — ABNORMAL HIGH (ref 65–99)

## 2016-05-13 LAB — ABO/RH: ABO/RH(D): A POS

## 2016-05-13 LAB — TYPE AND SCREEN
ABO/RH(D): A POS
ANTIBODY SCREEN: NEGATIVE

## 2016-05-13 LAB — BASIC METABOLIC PANEL
Anion gap: 10 (ref 5–15)
BUN: 10 mg/dL (ref 6–20)
CHLORIDE: 100 mmol/L — AB (ref 101–111)
CO2: 27 mmol/L (ref 22–32)
CREATININE: 0.76 mg/dL (ref 0.44–1.00)
Calcium: 8.9 mg/dL (ref 8.9–10.3)
GFR calc non Af Amer: >60 mL/min (ref >60)
Glucose, Bld: 219 mg/dL — ABNORMAL HIGH (ref 65–99)
POTASSIUM: 3.9 mmol/L (ref 3.5–5.1)
SODIUM: 137 mmol/L (ref 135–145)

## 2016-05-13 LAB — CBC
HEMATOCRIT: 42.2 % (ref 36.0–46.0)
Hemoglobin: 13.2 g/dL (ref 12.0–15.0)
MCH: 27.2 pg (ref 26.0–34.0)
MCHC: 31.3 g/dL (ref 30.0–36.0)
MCV: 86.8 fL (ref 78.0–100.0)
PLATELETS: 284 10*3/uL (ref 150–400)
RBC: 4.86 MIL/uL (ref 3.87–5.11)
RDW: 16.2 % — ABNORMAL HIGH (ref 11.5–15.5)
WBC: 17.1 10*3/uL — AB (ref 4.0–10.5)

## 2016-05-13 LAB — SURGICAL PCR SCREEN
MRSA, PCR: NEGATIVE
Staphylococcus aureus: NEGATIVE

## 2016-05-13 LAB — HCG, SERUM, QUALITATIVE: Preg, Serum: NEGATIVE

## 2016-05-13 NOTE — Progress Notes (Signed)
Requested Sleep Study from [place].

## 2016-05-14 LAB — HEMOGLOBIN A1C
HEMOGLOBIN A1C: 11 % — AB (ref 4.8–5.6)
MEAN PLASMA GLUCOSE: 269 mg/dL

## 2016-05-17 ENCOUNTER — Other Ambulatory Visit (HOSPITAL_BASED_OUTPATIENT_CLINIC_OR_DEPARTMENT_OTHER): Payer: 59

## 2016-05-17 ENCOUNTER — Other Ambulatory Visit: Payer: Self-pay | Admitting: Family

## 2016-05-17 ENCOUNTER — Ambulatory Visit (HOSPITAL_BASED_OUTPATIENT_CLINIC_OR_DEPARTMENT_OTHER): Payer: 59 | Admitting: Family

## 2016-05-17 ENCOUNTER — Encounter: Payer: Self-pay | Admitting: Family

## 2016-05-17 ENCOUNTER — Ambulatory Visit: Payer: 59

## 2016-05-17 VITALS — BP 127/74 | HR 88 | Temp 98.3°F | Resp 16 | Wt 264.8 lb

## 2016-05-17 DIAGNOSIS — D72823 Leukemoid reaction: Secondary | ICD-10-CM

## 2016-05-17 DIAGNOSIS — D72829 Elevated white blood cell count, unspecified: Secondary | ICD-10-CM | POA: Diagnosis not present

## 2016-05-17 DIAGNOSIS — D509 Iron deficiency anemia, unspecified: Secondary | ICD-10-CM

## 2016-05-17 DIAGNOSIS — Z720 Tobacco use: Secondary | ICD-10-CM | POA: Diagnosis not present

## 2016-05-17 DIAGNOSIS — D510 Vitamin B12 deficiency anemia due to intrinsic factor deficiency: Secondary | ICD-10-CM | POA: Diagnosis not present

## 2016-05-17 LAB — CBC WITH DIFFERENTIAL (CANCER CENTER ONLY)
BASO#: 0.2 10*3/uL (ref 0.0–0.2)
BASO%: 0.9 % (ref 0.0–2.0)
EOS ABS: 0.3 10*3/uL (ref 0.0–0.5)
EOS%: 1.4 % (ref 0.0–7.0)
HCT: 41.2 % (ref 34.8–46.6)
HEMOGLOBIN: 12.9 g/dL (ref 11.6–15.9)
LYMPH#: 2.5 10*3/uL (ref 0.9–3.3)
LYMPH%: 14.4 % (ref 14.0–48.0)
MCH: 27.4 pg (ref 26.0–34.0)
MCHC: 31.3 g/dL — AB (ref 32.0–36.0)
MCV: 88 fL (ref 81–101)
MONO#: 0.5 10*3/uL (ref 0.1–0.9)
MONO%: 2.9 % (ref 0.0–13.0)
NEUT#: 14.1 10*3/uL — ABNORMAL HIGH (ref 1.5–6.5)
NEUT%: 80.4 % — ABNORMAL HIGH (ref 39.6–80.0)
Platelets: 285 10*3/uL (ref 145–400)
RBC: 4.71 10*6/uL (ref 3.70–5.32)
RDW: 16.1 % — ABNORMAL HIGH (ref 11.1–15.7)
WBC: 17.6 10*3/uL — ABNORMAL HIGH (ref 3.9–10.0)

## 2016-05-17 LAB — COMPREHENSIVE METABOLIC PANEL (CC13)
ALBUMIN: 3.7 g/dL (ref 3.5–5.5)
ALT: 14 IU/L (ref 0–32)
AST (SGOT): 10 IU/L (ref 0–40)
Albumin/Globulin Ratio: 1.3 (ref 1.2–2.2)
Alkaline Phosphatase, S: 116 IU/L (ref 39–117)
BUN / CREAT RATIO: 13 (ref 9–23)
BUN: 9 mg/dL (ref 6–24)
Bilirubin Total: 0.2 mg/dL (ref 0.0–1.2)
CALCIUM: 8.8 mg/dL (ref 8.7–10.2)
CO2: 27 mmol/L (ref 18–29)
CREATININE: 0.72 mg/dL (ref 0.57–1.00)
Chloride, Ser: 97 mmol/L (ref 96–106)
GFR, EST AFRICAN AMERICAN: 118 mL/min/{1.73_m2} (ref >59)
GFR, EST NON AFRICAN AMERICAN: 102 mL/min/{1.73_m2} (ref >59)
GLOBULIN, TOTAL: 2.9 g/dL (ref 1.5–4.5)
Glucose: 323 mg/dL — ABNORMAL HIGH (ref 65–99)
Potassium, Ser: 4.2 mmol/L (ref 3.5–5.2)
SODIUM: 131 mmol/L — AB (ref 134–144)
Total Protein: 6.6 g/dL (ref 6.0–8.5)

## 2016-05-17 LAB — CHCC SATELLITE - SMEAR

## 2016-05-17 LAB — LACTATE DEHYDROGENASE: LDH: 232 U/L (ref 125–245)

## 2016-05-17 NOTE — Progress Notes (Signed)
Hematology/Oncology Consultation   Name: Tabitha Estrada      MRN: [id number]    Location: Room/bed info not found  Date: 05/17/2016 Time:4:25 PM   REFERRING PHYSICIAN: Emeterio Reeve, DO  REASON FOR CONSULT: Leukocytosis   DIAGNOSIS: Leukocytosis   HISTORY OF PRESENT ILLNESS: Tabitha Estrada is a very pleasant 46 yo caucasian female with history of leukocytosis for the last 4-5 months. WBC count today is 17.6. No anemia and platelet count is 285.  She has chronic back problems and history of 2 previous surgeries. She is scheduled to have posterior lumbar interbody fusion next week. She denies having received any steroid injections.  She c/o itching and burning of her skin however, there is no visible redness or rash.   She completed treatment for a UTI, + Group B strep, with Doxycycline earlier this month. She has is feeling better with this.  She is a smoker, 1/2-1 ppd. NO ETOH.  Other surgeries include tubal ligation and cholecystectomy.  She states that her cycles are very irregular and she has not had one yet this year. She has also been having hot flashes.  She states that she has a history or iron deficiency anemia but is not taking a supplement. She states that she has hemorrhoids that bleed occasionally with constipation.   No episodes of bruising or petechiae. No lymphadenopathy noted on exam.  No fever, chills, n/v, cough, rash, dizziness, SOB, chest pain, palpitations, abdominal pain or changes in bowel or bladder habits.  No swelling or tenderness in her extremities. 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  She has diabetes and blood sugars are not controlled on insulin. She states on average they run > 250.  She has maintained a good appetite and is staying well hydrated. Her weight is stable. She denies  Any unexplained weight loss or gain.   ROS: All other 10 point review of systems is negative.   PAST MEDICAL HISTORY:   Past Medical History:   Diagnosis Date  . Anemia   . Anxiety   . Arthritis   . Asthma   . Depression   . Diabetes mellitus without complication ([place])   . Dyspnea    with exertion and bronchititis  . Fibromyalgia   . GERD (gastroesophageal reflux disease)   . History of hiatal hernia   . History of kidney stones   . Hyperlipidemia   . Pneumonia    hs, 01-19-15  . Sleep apnea    has Cpap have not been using    ALLERGIES: Allergies  Allergen Reactions  . Dulaglutide Other (See Comments)  . Sulfur Nausea And Vomiting  . Metformin And Related Diarrhea      MEDICATIONS:  Current Outpatient Prescriptions on File Prior to Visit  Medication Sig Dispense Refill  . albuterol (PROVENTIL HFA;VENTOLIN HFA) 108 (90 Base) MCG/ACT inhaler Inhale 1-2 puffs into the lungs every 4 (four) hours as needed for wheezing or shortness of breath. 1 Inhaler 12  . atorvastatin (LIPITOR) 40 MG tablet Take 40 mg by mouth every evening.     . cetirizine (ZYRTEC) 10 MG tablet Take 1 tablet (10 mg total) by mouth 2 (two) times daily. 60 tablet 2  . gabapentin (NEURONTIN) 400 MG capsule Take 3 capsules (1,200 mg total) by mouth 3 (three) times daily as needed (neuropathic pain). Max dose 3600 mg /24 hours 270 capsule 5  . Glucose Blood (BLOOD GLUCOSE TEST STRIPS) STRP 1 each by Other route 3 (  three) times a day. Use as instructed    . ibuprofen (ADVIL,MOTRIN) 200 MG tablet Take 400 mg by mouth every 8 (eight) hours as needed for mild pain or moderate pain.    Marland Kitchen insulin aspart (NOVOLOG FLEXPEN) 100 UNIT/ML FlexPen Inject 28 Units into the skin 3 (three) times daily with meals.    . Insulin Degludec (TRESIBA FLEXTOUCH) 200 UNIT/ML SOPN Inject 40 Units into the skin 2 (two) times daily.    . Insulin Pen Needle 32G X 4 MM MISC by Does not apply route.    Marland Kitchen ipratropium (ATROVENT) 0.03 % nasal spray Place 2 sprays into both nostrils 3 (three) times daily as needed for rhinitis. 30 mL 1  . ipratropium-albuterol (DUONEB) 0.5-2.5 (3) MG/3ML  SOLN Take 3 mLs by nebulization every 2 (two) hours as needed (wheeze, SOB). 60 mL 3  . nystatin (MYCOSTATIN/NYSTOP) powder Apply 1 g topically 2 (two) times daily as needed.    Marland Kitchen omeprazole (PRILOSEC) 20 MG capsule TAKE 1 CAPSULE ONCE DAILY    . ondansetron (ZOFRAN-ODT) 8 MG disintegrating tablet Take 1 tablet (8 mg total) by mouth every 8 (eight) hours as needed for nausea. 15 tablet 0  . oxyCODONE-acetaminophen (ROXICET) 5-325 MG tablet Take 1 tablet by mouth every 8 (eight) hours as needed for severe pain (Used sparingly to prevent tolerance/dependence). 10 tablet 0  . valACYclovir (VALTREX) 500 MG tablet Take 500 mg by mouth at bedtime.      No current facility-administered medications on file prior to visit.      PAST SURGICAL HISTORY Past Surgical History:  Procedure Laterality Date  . BACK SURGERY  2015,2016  . CARPAL TUNNEL RELEASE Right 2008  . CHOLECYSTECTOMY  1992  . KIDNEY STONE SURGERY  2015  . TONSILLECTOMY  1979  . TUBAL LIGATION  2005    FAMILY HISTORY: Family History  Problem Relation Age of Onset  . Diabetes Father   . Hyperlipidemia Father   . Hypertension Father   . Diabetes Paternal Aunt   . Hypertension Paternal Aunt   . Heart attack Paternal Uncle   . Emphysema Maternal Grandmother   . Heart failure Maternal Grandmother   . Cancer Maternal Grandfather     SOCIAL HISTORY:  reports that she has been smoking Cigarettes.  She has a 16.00 pack-year smoking history. She has never used smokeless tobacco. She reports that she does not drink alcohol or use drugs.  PERFORMANCE STATUS: The patient's performance status is 1 - Symptomatic but completely ambulatory  PHYSICAL EXAM: Most Recent Vital Signs: Blood pressure 127/74, pulse 88, temperature 98.3 F (36.8 C), temperature source Oral, resp. rate 16, weight 264 lb 12.8 oz (120.1 kg), SpO2 96 %. BP 127/74 (BP Location: Right Arm, Patient Position: Sitting)   Pulse 88   Temp 98.3 F (36.8 C) (Oral)   Resp  16   Wt 264 lb 12.8 oz (120.1 kg)   SpO2 96%   BMI 44.07 kg/m   General Appearance:    Alert, cooperative, no distress, appears stated age  Head:    Normocephalic, without obvious abnormality, atraumatic  Eyes:    PERRL, conjunctiva/corneas clear, EOM's intact, fundi    benign, both eyes        Throat:   Lips, mucosa, and tongue normal; teeth and gums normal  Neck:   Supple, symmetrical, trachea midline, no adenopathy;    thyroid:  no enlargement/tenderness/nodules; no carotid   bruit or JVD  Back:     Symmetric, no curvature,  ROM normal, no CVA tenderness  Lungs:     Clear to auscultation bilaterally, respirations unlabored  Chest Wall:    No tenderness or deformity   Heart:    Regular rate and rhythm, S1 and S2 normal, no murmur, rub   or gallop     Abdomen:     Soft, non-tender, bowel sounds active all four quadrants,    no masses, no organomegaly        Extremities:   Extremities normal, atraumatic, no cyanosis or edema  Pulses:   2+ and symmetric all extremities  Skin:   Skin color, texture, turgor normal, no rashes or lesions  Lymph nodes:   Cervical, supraclavicular, and axillary nodes normal  Neurologic:   CNII-XII intact, normal strength, sensation and reflexes    throughout    LABORATORY DATA:  Results for orders placed or performed in visit on 05/17/16 (from the past 48 hour(s))  CBC w/Diff     Status: Abnormal   Collection Time: 05/17/16  1:50 PM  Result Value Ref Range   WBC 17.6 (H) 3.9 - 10.0 10e3/uL   RBC 4.71 3.70 - 5.32 10e6/uL   HGB 12.9 11.6 - 15.9 g/dL   HCT 41.2 34.8 - 46.6 %   MCV 88 81 - 101 fL   MCH 27.4 26.0 - 34.0 pg   MCHC 31.3 (L) 32.0 - 36.0 g/dL   RDW 16.1 (H) 11.1 - 15.7 %   Platelets 285 145 - 400 10e3/uL   NEUT# 14.1 (H) 1.5 - 6.5 10e3/uL   LYMPH# 2.5 0.9 - 3.3 10e3/uL   MONO# 0.5 0.1 - 0.9 10e3/uL   Eosinophils Absolute 0.3 0.0 - 0.5 10e3/uL   BASO# 0.2 0.0 - 0.2 10e3/uL   NEUT% 80.4 (H) 39.6 - 80.0 %   LYMPH% 14.4 14.0 - 48.0 %    MONO% 2.9 0.0 - 13.0 %   EOS% 1.4 0.0 - 7.0 %   BASO% 0.9 0.0 - 2.0 %  Smear     Status: None   Collection Time: 05/17/16  1:50 PM  Result Value Ref Range   Smear Result Smear Available       RADIOGRAPHY: No results found.     PATHOLOGY: None  ASSESSMENT/PLAN: Tabitha Estrada is a very pleasant 46 yo caucasian female with history of leukocytosis for the last 4-5 months. She has no anemia and platelet count is 285. She c/o fatigue as well as itching and burning sensation of the skin without rash or redness. WBC count today is 17.6.  She has chronic back issues with 2 previous surgeries and will be having a third later this week. She has quite a bit of pain with this and is ambulating with a cane for support.  She is also a heavy smoker.  She has poorly controlled diabetes, on insulin. Her blood sugar today is 323.  There was no evidence of malignancy on her blood smear but evidence of a possible B 12 deficiency. We will have her take B 12 5,000 mcg PO daily.  It appears that her leukocytosis is reactive to the chronic back issues, smoking and uncontrolled diabetes. From our standpoint she is ok to proceed with her back surgery later this week.  We will plan to see her back in 4 months for repeat lab work and follow-up.   All questions were answered. She will contact our office with any questions or concerns. We can certainly see her sooner if necessary.  She was discussed with and  also seen by Dr. Marin Olp and he is in agreement with the aforementioned.   Laverna [name], FNP-BC   Addendum:  I saw and examined the patient with Tabitha Estrada. I looked at her blood smear under the microscope. She has mature granulocytes. She does have numerous hypersegmented polys. I do not see any immature myeloid cells. She had no nucleated red blood cells. Platelets looked fairly uniform and well granulated.  I just don't see any obvious hematologic malignancy. I don't see anything that looks like a  myeloproliferative disorder. As such, I don't see that there is any indication for a bone marrow biopsy.  I don't see any hematologic issue that should prevent her from having back fusion. I think her biggest risk for back fusion healing is her diabetes. Apparently, her diabetes is not that well controlled.  I suspect that her leukocytosis is reactive. I don't see anything that looks like infection.  Of note, she has not had any type of steroid injections into her spine.  She is on inhalers for asthma. However noted inhalers are steroid-based.  The fact that she has had back surgery in the past, and the fact that she smokes, could also increase her risk of having leukocytosis.  Again, I don't see any hematologic contraindication for her having spinal fusion.  I did tell her to take over-the-counter vitamin B-12. 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 I think that this might be the case. A lot of times, I will see her diabetes along with pernicious anemia as both can be an autoimmune process. I told her to make sure she takes the 5000 g dose of vitamin B-12  I would like to get her back to see her in another 4 months. It will be interesting to see what her white cell count looks like at that point.  Lattie Haw, MD

## 2016-05-18 ENCOUNTER — Other Ambulatory Visit: Payer: Self-pay | Admitting: Osteopathic Medicine

## 2016-05-18 DIAGNOSIS — J4541 Moderate persistent asthma with (acute) exacerbation: Secondary | ICD-10-CM

## 2016-05-20 MED ORDER — DEXTROSE 5 % IV SOLN
3.0000 g | INTRAVENOUS | Status: AC
  Administered 2016-05-21: 3 g via INTRAVENOUS
  Filled 2016-05-20: qty 3000

## 2016-05-20 NOTE — Anesthesia Preprocedure Evaluation (Addendum)
Anesthesia Evaluation  Patient identified by MRN, date of birth, ID band Patient awake    Reviewed: Allergy & Precautions, NPO status , Patient's Chart, lab work & pertinent test results  Airway Mallampati: III  TM Distance: >3 FB Neck ROM: Full    Dental  (+) Dental Advisory Given   Pulmonary shortness of breath, asthma , sleep apnea , Current Smoker,    breath sounds clear to auscultation       Cardiovascular hypertension,  Rhythm:Regular Rate:Normal     Neuro/Psych  Neuromuscular disease    GI/Hepatic Neg liver ROS, hiatal hernia, GERD  Medicated and Controlled,  Endo/Other  diabetes, Poorly Controlled, Type 2, Insulin DependentMorbid obesity  Renal/GU negative Renal ROS     Musculoskeletal  (+) Arthritis , Fibromyalgia -  Abdominal   Peds  Hematology negative hematology ROS (+) anemia ,   Anesthesia Other Findings   Reproductive/Obstetrics                           Lab Results  Component Value Date   WBC 17.6 (H) 05/17/2016   HGB 12.9 05/17/2016   HCT 41.2 05/17/2016   MCV 88 05/17/2016   PLT 285 05/17/2016   Lab Results  Component Value Date   CREATININE 0.72 05/17/2016   BUN 9 05/17/2016   NA 131 (L) 05/17/2016   K 4.2 05/17/2016   CL 97 05/17/2016   CO2 27 05/17/2016    Anesthesia Physical Anesthesia Plan  ASA: III  Anesthesia Plan: General   Post-op Pain Management:    Induction: Intravenous  Airway Management Planned: Oral ETT  Additional Equipment:   Intra-op Plan:   Post-operative Plan: Extubation in OR  Informed Consent: I have reviewed the patients History and Physical, chart, labs and discussed the procedure including the risks, benefits and alternatives for the proposed anesthesia with the patient or authorized representative who has indicated his/her understanding and acceptance.   Dental advisory given  Plan Discussed with: Anesthesiologist and  Surgeon  Anesthesia Plan Comments:       Anesthesia Quick Evaluation

## 2016-05-21 ENCOUNTER — Encounter (HOSPITAL_COMMUNITY): Payer: Self-pay | Admitting: *Deleted

## 2016-05-21 ENCOUNTER — Encounter (HOSPITAL_COMMUNITY)
Admission: RE | Disposition: A | Discharge status: Home / Self Care | Payer: Self-pay | Source: Ambulatory Visit | Attending: Neurosurgery

## 2016-05-21 ENCOUNTER — Inpatient Hospital Stay (HOSPITAL_COMMUNITY): Payer: 59 | Admitting: Anesthesiology

## 2016-05-21 ENCOUNTER — Inpatient Hospital Stay (HOSPITAL_COMMUNITY)
Admission: RE | Admit: 2016-05-21 | Discharge: 2016-05-23 | DRG: 455 | Disposition: A | Discharge status: Home / Self Care | Payer: 59 | Source: Ambulatory Visit | Attending: Neurosurgery | Admitting: Neurosurgery

## 2016-05-21 ENCOUNTER — Inpatient Hospital Stay (HOSPITAL_COMMUNITY): Payer: 59

## 2016-05-21 DIAGNOSIS — Z419 Encounter for procedure for purposes other than remedying health state, unspecified: Secondary | ICD-10-CM

## 2016-05-21 DIAGNOSIS — K219 Gastro-esophageal reflux disease without esophagitis: Secondary | ICD-10-CM | POA: Diagnosis present

## 2016-05-21 DIAGNOSIS — F17210 Nicotine dependence, cigarettes, uncomplicated: Secondary | ICD-10-CM | POA: Diagnosis present

## 2016-05-21 DIAGNOSIS — I10 Essential (primary) hypertension: Secondary | ICD-10-CM | POA: Diagnosis present

## 2016-05-21 DIAGNOSIS — F419 Anxiety disorder, unspecified: Secondary | ICD-10-CM | POA: Diagnosis present

## 2016-05-21 DIAGNOSIS — M545 Low back pain: Secondary | ICD-10-CM | POA: Diagnosis present

## 2016-05-21 DIAGNOSIS — E785 Hyperlipidemia, unspecified: Secondary | ICD-10-CM | POA: Diagnosis present

## 2016-05-21 DIAGNOSIS — F329 Major depressive disorder, single episode, unspecified: Secondary | ICD-10-CM | POA: Diagnosis present

## 2016-05-21 DIAGNOSIS — G4730 Sleep apnea, unspecified: Secondary | ICD-10-CM | POA: Diagnosis present

## 2016-05-21 DIAGNOSIS — M5116 Intervertebral disc disorders with radiculopathy, lumbar region: Principal | ICD-10-CM | POA: Diagnosis present

## 2016-05-21 DIAGNOSIS — M797 Fibromyalgia: Secondary | ICD-10-CM | POA: Diagnosis present

## 2016-05-21 DIAGNOSIS — J45909 Unspecified asthma, uncomplicated: Secondary | ICD-10-CM | POA: Diagnosis present

## 2016-05-21 DIAGNOSIS — M5126 Other intervertebral disc displacement, lumbar region: Secondary | ICD-10-CM | POA: Diagnosis present

## 2016-05-21 DIAGNOSIS — E119 Type 2 diabetes mellitus without complications: Secondary | ICD-10-CM | POA: Diagnosis present

## 2016-05-21 LAB — GLUCOSE, CAPILLARY
GLUCOSE-CAPILLARY: 158 mg/dL — AB (ref 65–99)
GLUCOSE-CAPILLARY: 192 mg/dL — AB (ref 65–99)
Glucose-Capillary: 162 mg/dL — ABNORMAL HIGH (ref 65–99)
Glucose-Capillary: 161 mg/dL — ABNORMAL HIGH (ref 65–99)

## 2016-05-21 SURGERY — POSTERIOR LUMBAR FUSION 1 LEVEL
Anesthesia: General | Laterality: Right

## 2016-05-21 MED ORDER — FENTANYL CITRATE (PF) 100 MCG/2ML IJ SOLN
INTRAMUSCULAR | Status: DC | PRN
  Administered 2016-05-21: 100 ug via INTRAVENOUS
  Administered 2016-05-21 (×2): 50 ug via INTRAVENOUS

## 2016-05-21 MED ORDER — HYDROMORPHONE HCL 1 MG/ML IJ SOLN
0.2500 mg | INTRAMUSCULAR | Status: DC | PRN
  Administered 2016-05-21: 0.5 mg via INTRAVENOUS

## 2016-05-21 MED ORDER — LORATADINE 10 MG PO TABS
10.0000 mg | ORAL_TABLET | Freq: Every day | ORAL | Status: DC
  Administered 2016-05-21 – 2016-05-23 (×3): 10 mg via ORAL
  Filled 2016-05-21 (×3): qty 1

## 2016-05-21 MED ORDER — ACETAMINOPHEN 650 MG RE SUPP: 650.0000 mg | RECTAL | Status: DC | PRN

## 2016-05-21 MED ORDER — CHLORHEXIDINE GLUCONATE CLOTH 2 % EX PADS: 6.0000 | MEDICATED_PAD | Freq: Once | CUTANEOUS | Status: DC

## 2016-05-21 MED ORDER — ONDANSETRON HCL 4 MG/2ML IJ SOLN
INTRAMUSCULAR | Status: DC | PRN
  Administered 2016-05-21: 4 mg via INTRAVENOUS

## 2016-05-21 MED ORDER — LACTATED RINGERS IV SOLN
INTRAVENOUS | Status: DC | PRN
  Administered 2016-05-21 (×3): via INTRAVENOUS

## 2016-05-21 MED ORDER — LIDOCAINE HCL (CARDIAC) 20 MG/ML IV SOLN
INTRAVENOUS | Status: DC | PRN
  Administered 2016-05-21: 100 mg via INTRAVENOUS

## 2016-05-21 MED ORDER — SODIUM CHLORIDE 0.9% FLUSH
3.0000 mL | Freq: Two times a day (BID) | INTRAVENOUS | Status: DC
  Administered 2016-05-22 – 2016-05-23 (×2): 3 mL via INTRAVENOUS

## 2016-05-21 MED ORDER — 0.9 % SODIUM CHLORIDE (POUR BTL) OPTIME
TOPICAL | Status: DC | PRN
  Administered 2016-05-21: 1000 mL

## 2016-05-21 MED ORDER — IPRATROPIUM BROMIDE 0.03 % NA SOLN
2.0000 | Freq: Three times a day (TID) | NASAL | Status: DC | PRN
  Filled 2016-05-21: qty 30

## 2016-05-21 MED ORDER — PHENOL 1.4 % MT LIQD: 1.0000 | OROMUCOSAL | Status: DC | PRN

## 2016-05-21 MED ORDER — NYSTATIN 100000 UNIT/GM EX POWD
1.0000 g | Freq: Two times a day (BID) | CUTANEOUS | Status: DC
  Administered 2016-05-22 – 2016-05-23 (×2): 1 g via TOPICAL
  Filled 2016-05-21: qty 15

## 2016-05-21 MED ORDER — MIDAZOLAM HCL 2 MG/2ML IJ SOLN
INTRAMUSCULAR | Status: AC
  Filled 2016-05-21: qty 2

## 2016-05-21 MED ORDER — HYDROMORPHONE HCL 1 MG/ML IJ SOLN
0.2500 mg | INTRAMUSCULAR | Status: DC | PRN
  Administered 2016-05-21 (×3): 0.5 mg via INTRAVENOUS

## 2016-05-21 MED ORDER — ONDANSETRON HCL 4 MG/2ML IJ SOLN: 4.0000 mg | Freq: Four times a day (QID) | INTRAMUSCULAR | Status: DC | PRN

## 2016-05-21 MED ORDER — POTASSIUM CHLORIDE IN NACL 20-0.9 MEQ/L-% IV SOLN
INTRAVENOUS | Status: DC
  Administered 2016-05-21: 22:00:00 via INTRAVENOUS
  Filled 2016-05-21 (×3): qty 1000

## 2016-05-21 MED ORDER — ALBUTEROL SULFATE (2.5 MG/3ML) 0.083% IN NEBU: 2.5000 mg | INHALATION_SOLUTION | RESPIRATORY_TRACT | Status: DC | PRN

## 2016-05-21 MED ORDER — DOCUSATE SODIUM 100 MG PO CAPS
100.0000 mg | ORAL_CAPSULE | Freq: Two times a day (BID) | ORAL | Status: DC
  Administered 2016-05-21 – 2016-05-23 (×4): 100 mg via ORAL
  Filled 2016-05-21 (×5): qty 1

## 2016-05-21 MED ORDER — OXYCODONE-ACETAMINOPHEN 5-325 MG PO TABS
1.0000 | ORAL_TABLET | Freq: Three times a day (TID) | ORAL | Status: DC | PRN
  Administered 2016-05-21: 1 via ORAL
  Filled 2016-05-21: qty 1

## 2016-05-21 MED ORDER — ROCURONIUM BROMIDE 100 MG/10ML IV SOLN
INTRAVENOUS | Status: DC | PRN
  Administered 2016-05-21 (×2): 10 mg via INTRAVENOUS
  Administered 2016-05-21: 50 mg via INTRAVENOUS
  Administered 2016-05-21: 10 mg via INTRAVENOUS

## 2016-05-21 MED ORDER — LIDOCAINE-EPINEPHRINE (PF) 2 %-1:200000 IJ SOLN
INTRAMUSCULAR | Status: AC
  Filled 2016-05-21: qty 20

## 2016-05-21 MED ORDER — INSULIN ASPART 100 UNIT/ML ~~LOC~~ SOLN
28.0000 [IU] | Freq: Three times a day (TID) | SUBCUTANEOUS | Status: DC
  Administered 2016-05-22 – 2016-05-23 (×4): 28 [IU] via SUBCUTANEOUS

## 2016-05-21 MED ORDER — ZOLPIDEM TARTRATE 5 MG PO TABS: 5.0000 mg | ORAL_TABLET | Freq: Every evening | ORAL | Status: DC | PRN

## 2016-05-21 MED ORDER — INSULIN GLARGINE 100 UNIT/ML ~~LOC~~ SOLN
40.0000 [IU] | Freq: Two times a day (BID) | SUBCUTANEOUS | Status: DC
  Administered 2016-05-21 – 2016-05-23 (×4): 40 [IU] via SUBCUTANEOUS
  Filled 2016-05-21 (×5): qty 0.4

## 2016-05-21 MED ORDER — ROCURONIUM BROMIDE 50 MG/5ML IV SOSY
PREFILLED_SYRINGE | INTRAVENOUS | Status: AC
  Filled 2016-05-21: qty 10

## 2016-05-21 MED ORDER — PROPOFOL 10 MG/ML IV BOLUS
INTRAVENOUS | Status: AC
  Filled 2016-05-21: qty 20

## 2016-05-21 MED ORDER — EPHEDRINE 5 MG/ML INJ
INTRAVENOUS | Status: AC
  Filled 2016-05-21: qty 10

## 2016-05-21 MED ORDER — SUCCINYLCHOLINE CHLORIDE 200 MG/10ML IV SOSY
PREFILLED_SYRINGE | INTRAVENOUS | Status: AC
  Filled 2016-05-21: qty 10

## 2016-05-21 MED ORDER — ONDANSETRON HCL 4 MG PO TABS
4.0000 mg | ORAL_TABLET | Freq: Four times a day (QID) | ORAL | Status: DC | PRN
  Administered 2016-05-23: 4 mg via ORAL
  Filled 2016-05-21: qty 1

## 2016-05-21 MED ORDER — MENTHOL 3 MG MT LOZG: 1.0000 | LOZENGE | OROMUCOSAL | Status: DC | PRN

## 2016-05-21 MED ORDER — ONDANSETRON 4 MG PO TBDP: 8.0000 mg | ORAL_TABLET | Freq: Three times a day (TID) | ORAL | Status: DC | PRN

## 2016-05-21 MED ORDER — PROPOFOL 10 MG/ML IV BOLUS
INTRAVENOUS | Status: DC | PRN
  Administered 2016-05-21: 160 mg via INTRAVENOUS

## 2016-05-21 MED ORDER — HYDROMORPHONE HCL 1 MG/ML IJ SOLN
INTRAMUSCULAR | Status: AC
  Filled 2016-05-21: qty 0.5

## 2016-05-21 MED ORDER — SODIUM CHLORIDE 0.9% FLUSH: 3.0000 mL | INTRAVENOUS | Status: DC | PRN

## 2016-05-21 MED ORDER — SUGAMMADEX SODIUM 200 MG/2ML IV SOLN
INTRAVENOUS | Status: DC | PRN
Start: 2016-05-21 — End: 2016-05-21
  Administered 2016-05-21: 200 mg via INTRAVENOUS

## 2016-05-21 MED ORDER — GABAPENTIN 400 MG PO CAPS
1200.0000 mg | ORAL_CAPSULE | Freq: Three times a day (TID) | ORAL | Status: DC | PRN
  Administered 2016-05-21 – 2016-05-23 (×4): 1200 mg via ORAL
  Filled 2016-05-21 (×4): qty 3

## 2016-05-21 MED ORDER — ACETAMINOPHEN 325 MG PO TABS: 650.0000 mg | ORAL_TABLET | ORAL | Status: DC | PRN

## 2016-05-21 MED ORDER — ONDANSETRON HCL 4 MG/2ML IJ SOLN
INTRAMUSCULAR | Status: AC
  Filled 2016-05-21: qty 2

## 2016-05-21 MED ORDER — FENTANYL CITRATE (PF) 100 MCG/2ML IJ SOLN
INTRAMUSCULAR | Status: AC
  Filled 2016-05-21: qty 2

## 2016-05-21 MED ORDER — PROMETHAZINE HCL 25 MG/ML IJ SOLN
INTRAMUSCULAR | Status: AC
  Filled 2016-05-21: qty 1

## 2016-05-21 MED ORDER — IPRATROPIUM-ALBUTEROL 0.5-2.5 (3) MG/3ML IN SOLN: 3.0000 mL | RESPIRATORY_TRACT | Status: DC | PRN

## 2016-05-21 MED ORDER — PANTOPRAZOLE SODIUM 40 MG PO TBEC
40.0000 mg | DELAYED_RELEASE_TABLET | Freq: Every day | ORAL | Status: DC
  Administered 2016-05-21 – 2016-05-23 (×3): 40 mg via ORAL
  Filled 2016-05-21 (×3): qty 1

## 2016-05-21 MED ORDER — SODIUM CHLORIDE 0.9 % IV SOLN: 250.0000 mL | INTRAVENOUS | Status: DC

## 2016-05-21 MED ORDER — PROMETHAZINE HCL 25 MG/ML IJ SOLN
6.2500 mg | INTRAMUSCULAR | Status: DC | PRN
  Administered 2016-05-21: 6.25 mg via INTRAVENOUS

## 2016-05-21 MED ORDER — HYDROMORPHONE HCL 1 MG/ML IJ SOLN
INTRAMUSCULAR | Status: AC
  Filled 2016-05-21: qty 1

## 2016-05-21 MED ORDER — ALBUTEROL SULFATE HFA 108 (90 BASE) MCG/ACT IN AERS
INHALATION_SPRAY | RESPIRATORY_TRACT | Status: DC | PRN
  Administered 2016-05-21 (×2): 4 via RESPIRATORY_TRACT

## 2016-05-21 MED ORDER — FENTANYL CITRATE (PF) 100 MCG/2ML IJ SOLN
INTRAMUSCULAR | Status: AC
  Filled 2016-05-21: qty 4

## 2016-05-21 MED ORDER — VALACYCLOVIR HCL 500 MG PO TABS
500.0000 mg | ORAL_TABLET | Freq: Every day | ORAL | Status: DC
  Administered 2016-05-21 – 2016-05-22 (×2): 500 mg via ORAL
  Filled 2016-05-21 (×2): qty 1

## 2016-05-21 MED ORDER — ATORVASTATIN CALCIUM 40 MG PO TABS
40.0000 mg | ORAL_TABLET | Freq: Every evening | ORAL | Status: DC
  Administered 2016-05-21 – 2016-05-22 (×2): 40 mg via ORAL
  Filled 2016-05-21 (×2): qty 1

## 2016-05-21 MED ORDER — DIAZEPAM 5 MG PO TABS
5.0000 mg | ORAL_TABLET | Freq: Four times a day (QID) | ORAL | Status: DC | PRN
  Administered 2016-05-21 – 2016-05-23 (×5): 5 mg via ORAL
  Filled 2016-05-21 (×5): qty 1

## 2016-05-21 MED ORDER — PROMETHAZINE HCL 25 MG/ML IJ SOLN: 6.2500 mg | INTRAMUSCULAR | Status: DC | PRN

## 2016-05-21 MED ORDER — SURGIFOAM 100 EX MISC
CUTANEOUS | Status: DC | PRN
  Administered 2016-05-21: 07:00:00 via TOPICAL

## 2016-05-21 MED ORDER — ALBUMIN HUMAN 5 % IV SOLN
INTRAVENOUS | Status: DC | PRN
  Administered 2016-05-21: 09:00:00 via INTRAVENOUS

## 2016-05-21 MED ORDER — IPRATROPIUM BROMIDE 0.06 % NA SOLN
1.0000 | Freq: Three times a day (TID) | NASAL | Status: DC | PRN
  Filled 2016-05-21: qty 15

## 2016-05-21 MED ORDER — MIDAZOLAM HCL 5 MG/5ML IJ SOLN
INTRAMUSCULAR | Status: DC | PRN
  Administered 2016-05-21: 2 mg via INTRAVENOUS

## 2016-05-21 MED ORDER — THROMBIN 20000 UNITS EX SOLR
CUTANEOUS | Status: AC
  Filled 2016-05-21: qty 20000

## 2016-05-21 SURGICAL SUPPLY — 70 items
ADH SKN CLS APL DERMABOND .7 (GAUZE/BANDAGES/DRESSINGS) ×1
APL SKNCLS STERI-STRIP NONHPOA (GAUZE/BANDAGES/DRESSINGS)
BENZOIN TINCTURE PRP APPL 2/3 (GAUZE/BANDAGES/DRESSINGS) IMPLANT
BLADE CLIPPER SURG (BLADE) IMPLANT
BONE VIVIGEN FORMABLE 10CC (Bone Implant) ×3 IMPLANT
BUR MATCHSTICK NEURO 3.0 LAGG (BURR) ×3 IMPLANT
BUR PRECISION FLUTE 5.0 (BURR) ×3 IMPLANT
CANISTER SUCT 3000ML PPV (MISCELLANEOUS) ×3 IMPLANT
CAP RELINE MOD TULIP RMM (Cap) ×6 IMPLANT
CARTRIDGE OIL MAESTRO DRILL (MISCELLANEOUS) ×1 IMPLANT
CLOSURE WOUND 1/2 X4 (GAUZE/BANDAGES/DRESSINGS)
CONT SPEC 4OZ CLIKSEAL STRL BL (MISCELLANEOUS) ×3 IMPLANT
COVER BACK TABLE 60X90IN (DRAPES) ×3 IMPLANT
DECANTER SPIKE VIAL GLASS SM (MISCELLANEOUS) ×3 IMPLANT
DERMABOND ADVANCED (GAUZE/BANDAGES/DRESSINGS) ×2
DERMABOND ADVANCED .7 DNX12 (GAUZE/BANDAGES/DRESSINGS) ×1 IMPLANT
DIFFUSER DRILL AIR PNEUMATIC (MISCELLANEOUS) ×3 IMPLANT
DRAPE C-ARM 42X72 X-RAY (DRAPES) ×6 IMPLANT
DRAPE C-ARMOR (DRAPES) ×3 IMPLANT
DRAPE LAPAROTOMY 100X72X124 (DRAPES) ×3 IMPLANT
DRAPE POUCH INSTRU U-SHP 10X18 (DRAPES) ×3 IMPLANT
DRAPE SURG 17X23 STRL (DRAPES) ×3 IMPLANT
DURAPREP 26ML APPLICATOR (WOUND CARE) ×3 IMPLANT
ELECT CAUTERY BLADE 6.4 (BLADE) ×3 IMPLANT
ELECT REM PT RETURN 9FT ADLT (ELECTROSURGICAL) ×3
ELECTRODE REM PT RTRN 9FT ADLT (ELECTROSURGICAL) ×1 IMPLANT
GAUZE SPONGE 4X4 12PLY STRL (GAUZE/BANDAGES/DRESSINGS) IMPLANT
GAUZE SPONGE 4X4 16PLY XRAY LF (GAUZE/BANDAGES/DRESSINGS) IMPLANT
GLOVE BIO SURGEON STRL SZ8 (GLOVE) ×3 IMPLANT
GLOVE ECLIPSE 6.5 STRL STRAW (GLOVE) ×6 IMPLANT
GLOVE EXAM NITRILE LRG STRL (GLOVE) IMPLANT
GLOVE EXAM NITRILE XL STR (GLOVE) IMPLANT
GLOVE EXAM NITRILE XS STR PU (GLOVE) IMPLANT
GLOVE INDICATOR 7.0 STRL GRN (GLOVE) ×6 IMPLANT
GLOVE INDICATOR 7.5 STRL GRN (GLOVE) ×6 IMPLANT
GLOVE SURG SS PI 7.0 STRL IVOR (GLOVE) ×12 IMPLANT
GOWN STRL REUS W/ TWL LRG LVL3 (GOWN DISPOSABLE) ×2 IMPLANT
GOWN STRL REUS W/ TWL XL LVL3 (GOWN DISPOSABLE) ×1 IMPLANT
GOWN STRL REUS W/TWL 2XL LVL3 (GOWN DISPOSABLE) IMPLANT
GOWN STRL REUS W/TWL LRG LVL3 (GOWN DISPOSABLE) ×6
GOWN STRL REUS W/TWL XL LVL3 (GOWN DISPOSABLE) ×3
KIT BASIN OR (CUSTOM PROCEDURE TRAY) ×3 IMPLANT
KIT POSITION SURG JACKSON T1 (MISCELLANEOUS) ×3 IMPLANT
KIT ROOM TURNOVER OR (KITS) ×3 IMPLANT
NEEDLE HYPO 25X1 1.5 SAFETY (NEEDLE) ×3 IMPLANT
NEEDLE SPNL 18GX3.5 QUINCKE PK (NEEDLE) ×3 IMPLANT
NS IRRIG 1000ML POUR BTL (IV SOLUTION) ×3 IMPLANT
OIL CARTRIDGE MAESTRO DRILL (MISCELLANEOUS) ×3
PACK LAMINECTOMY NEURO (CUSTOM PROCEDURE TRAY) ×3 IMPLANT
PAD ARMBOARD 7.5X6 YLW CONV (MISCELLANEOUS) ×6 IMPLANT
PLATE SPINOUS PROCESS 35MM (Plate) ×3 IMPLANT
ROD RELINE COCR LORD 5X30 (Rod) ×2 IMPLANT
SCREW BREAKOFF M4 (Screw) ×3 IMPLANT
SCREW LOCK RSS 4.5/5.0MM (Screw) ×6 IMPLANT
SCREW SHANK RELINE MOD 6.5X35 (Screw) ×3 IMPLANT
SHANK RELINE MOD 5.5X40 (Screw) ×2 IMPLANT
SPACER OPAL 10 MM (Orthopedic Implant) ×3 IMPLANT
SPONGE LAP 4X18 X RAY DECT (DISPOSABLE) IMPLANT
SPONGE SURGIFOAM ABS GEL 100 (HEMOSTASIS) ×3 IMPLANT
STRIP CLOSURE SKIN 1/2X4 (GAUZE/BANDAGES/DRESSINGS) IMPLANT
SUT PROLENE 6 0 BV (SUTURE) IMPLANT
SUT VIC AB 0 CT1 18XCR BRD8 (SUTURE) ×2 IMPLANT
SUT VIC AB 0 CT1 8-18 (SUTURE) ×6
SUT VIC AB 2-0 CT1 18 (SUTURE) ×6 IMPLANT
SUT VIC AB 3-0 SH 8-18 (SUTURE) ×3 IMPLANT
SYR CONTROL 10ML LL (SYRINGE) ×3 IMPLANT
TOWEL GREEN STERILE (TOWEL DISPOSABLE) ×2 IMPLANT
TOWEL GREEN STERILE FF (TOWEL DISPOSABLE) ×2 IMPLANT
TRAY FOLEY W/METER SILVER 16FR (SET/KITS/TRAYS/PACK) ×3 IMPLANT
WATER STERILE IRR 1000ML POUR (IV SOLUTION) ×3 IMPLANT

## 2016-05-21 NOTE — Anesthesia Procedure Notes (Signed)
Procedure Name: Intubation Date/Time: 05/21/2016 7:51 AM Performed by: Carney Living Pre-anesthesia Checklist: Patient identified, Emergency Drugs available, Suction available, Patient being monitored and Timeout performed Patient Re-evaluated:Patient Re-evaluated prior to inductionOxygen Delivery Method: Circle system utilized Preoxygenation: Pre-oxygenation with 100% oxygen Intubation Type: IV induction Ventilation: Mask ventilation without difficulty and Oral airway inserted - appropriate to patient size Laryngoscope Size: Mac and 4 Grade View: Grade I Tube type: Oral Tube size: 7.0 mm Number of attempts: 1 Airway Equipment and Method: Stylet Placement Confirmation: ETT inserted through vocal cords under direct vision,  positive ETCO2 and breath sounds checked- equal and bilateral Secured at: 21 cm Tube secured with: Tape Dental Injury: Teeth and Oropharynx as per pre-operative assessment

## 2016-05-21 NOTE — Transfer of Care (Signed)
Immediate Anesthesia Transfer of Care Note  Patient: Tabitha Estrada  Procedure(s) Performed: Procedure(s): POSTERIOR LUMBAR INTERBODY FUSION LUMBAR FIVE- SACRAL ONE RIGHT (Right)  Patient Location: PACU  Anesthesia Type:General  Level of Consciousness: awake, alert , oriented and patient cooperative  Airway & Oxygen Therapy: Patient Spontanous Breathing and Patient connected to nasal cannula oxygen  Post-op Assessment: Report given to RN, Post -op Vital signs reviewed and stable and Patient moving all extremities X 4  Post vital signs: Reviewed and stable  Last Vitals:  Vitals:   05/21/16 0612  BP: 119/70  Pulse: 86  Resp: 18  Temp: 36.8 C    Last Pain: There were no vitals filed for this visit.       Complications: No apparent anesthesia complications

## 2016-05-21 NOTE — Progress Notes (Signed)
Orthopedic Tech Progress Note Patient Details:  Tabitha Estrada 03-21-1970 [id number] Patient has brace. Patient ID: Tabitha Estrada, female   DOB: 03/19/1970, 46 y.o.   MRN: [id number]   [name] 05/21/2016, 11:02 PM

## 2016-05-21 NOTE — Anesthesia Postprocedure Evaluation (Signed)
Anesthesia Post Note  Patient: Tabitha Estrada  Procedure(s) Performed: Procedure(s) (LRB): POSTERIOR LUMBAR INTERBODY FUSION LUMBAR FIVE- SACRAL ONE RIGHT (Right)  Patient location during evaluation: PACU Anesthesia Type: General Level of consciousness: awake and alert Pain management: pain level controlled Vital Signs Assessment: post-procedure vital signs reviewed and stable Respiratory status: spontaneous breathing, nonlabored ventilation, respiratory function stable and patient connected to nasal cannula oxygen Cardiovascular status: blood pressure returned to baseline and stable Postop Assessment: no signs of nausea or vomiting Anesthetic complications: no       Last Vitals:  Vitals:   05/21/16 1730 05/21/16 1745  BP:  106/65  Pulse: 83 84  Resp: 19 14  Temp:      Last Pain:  Vitals:   05/21/16 1538  PainSc: 8                  Tiajuana Amass

## 2016-05-21 NOTE — H&P (Signed)
BP 119/70   Pulse 86   Temp 98.3 F (36.8 C)   Resp 18   Wt 119.7 kg (264 lb)   SpO2 96%   BMI 43.93 kg/m  [name] presents with a recurrent disc herniation at L5/S1. She is admitted for a disectomy and fusion at L5/S1 Allergies  Allergen Reactions  . Dulaglutide Other (See Comments)  . Sulfur Nausea And Vomiting  . Metformin And Related Diarrhea   Past Medical History:  Diagnosis Date  . Anemia   . Anxiety   . Arthritis   . Asthma   . Depression   . Diabetes mellitus without complication ([place])   . Dyspnea    with exertion and bronchititis  . Fibromyalgia   . GERD (gastroesophageal reflux disease)   . History of hiatal hernia   . History of kidney stones   . Hyperlipidemia   . Pneumonia    hs, 01-19-15  . Sleep apnea    has Cpap have not been using   Past Surgical History:  Procedure Laterality Date  . BACK SURGERY  2015,2016  . CARPAL TUNNEL RELEASE Right 2008  . CHOLECYSTECTOMY  1992  . KIDNEY STONE SURGERY  2015  . TONSILLECTOMY  1979  . TUBAL LIGATION  2005   Family History  Problem Relation Age of Onset  . Diabetes Father   . Hyperlipidemia Father   . Hypertension Father   . Diabetes Paternal Aunt   . Hypertension Paternal Aunt   . Heart attack Paternal Uncle   . Emphysema Maternal Grandmother   . Heart failure Maternal Grandmother   . Cancer Maternal Grandfather    Social History   Social History  . Marital status: Married    Spouse name: N/A  . Number of children: N/A  . Years of education: N/A   Occupational History  . Not on file.   Social History Main Topics  . Smoking status: Current Every Day Smoker    Packs/day: 0.50    Years: 32.00    Types: Cigarettes  . Smokeless tobacco: Never Used     Comment: 05/17/2016 still smokes  . Alcohol use No  . Drug use: No  . Sexual activity: Not on file   Other Topics Concern  . Not on file   Social History Narrative  . No narrative on file   Physical Exam  Constitutional:  She is oriented to person, place, and time. She appears well-developed and well-nourished.  HENT:  Head: Normocephalic and atraumatic.  Eyes: EOM are normal. Pupils are equal, round, and reactive to light.  Neck: Normal range of motion. Neck supple.  Abdominal: Soft. Bowel sounds are normal.  Musculoskeletal: Normal range of motion.  Neurological: She is alert and oriented to person, place, and time. She displays normal reflexes. No cranial nerve deficit or sensory deficit. She exhibits normal muscle tone. Coordination normal. She displays no Babinski's sign on the right side. She displays no Babinski's sign on the left side.  Skin: Skin is warm and dry.  Psychiatric: She has a normal mood and affect. Her behavior is normal. Judgment and thought content normal.   [name] returns so we can over the MRI results. She has a recurrent disc herniation on the right side at L5-S1. This fits with her presentation and current symptoms.   IMPRESSION/PLAN: Given that this is the second recurrence, she needs to be fused at this level, that is the teaching of neurosurgery, that is the reasonable expectation. One does not  put another person through yet another discectomy through previous disc operations at the same levels. I would only fuse the right side. It might be that I would use a spiral plate or just simply screws on the right side, but I would not violate the left side at all, which she has had, and does not have any issues. There is a great deal of scar tissue present on the right side. The risk and benefits I went over, bleeding, infection, no relief, fusion failure, hardware failure, need for further surgery, and damage to the nerve roots. We will submit for insurance purposes. She would like to know what her contribution would be.

## 2016-05-21 NOTE — Op Note (Signed)
05/21/2016  4:13 PM  PATIENT:  Tabitha Estrada  46 y.o. female with a recurrent displaced disc eccentric to the right at L5/S1. 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  POST-OPERATIVE DIAGNOSIS: Recurrent DISPLACEMENT OF LUMBOSACRAL INTERVERTEBRAL DISC L5/S1 right   PROCEDURE:  Procedure(s): POSTERIOR LUMBAR INTERBODY FUSION LUMBAR FIVE- SACRAL ONE RIGHT, Peek cage, auto and allograft morsels Posterior non segmental pedicle screw Nuvasive Mas plif instrumentation L5, S1 on the right  Spire plate L5-S1 Posterolateral arthrodesis L5/S1 allograft morsels  SURGEON:  Surgeon(s): Ashok Pall, MD Newman Pies, MD  ASSISTANTS:Jenkins, Dellis Filbert  ANESTHESIA:   general  EBL:  Total I/O In: 3250 [I.V.:2700; IV Piggyback:550] Out: 305 [Urine:105; Blood:200]  BLOOD ADMINISTERED:none  CELL SAVER GIVEN:none  COUNT:per nursing  DRAINS: none   SPECIMEN:  No Specimen  DICTATION: Tabitha Estrada is a 46 y.o. female whom was taken to the operating room intubated, and placed under a general anesthetic without difficulty. A foley catheter was placed under sterile conditions. She was positioned prone on a Jackson stable with all pressure points properly padded.  Her lumbar region was prepped and draped in a sterile manner. I infiltrated 20cc's 1/2%lidocaine/1:2000,000 strength epinephrine into the planned incision. I opened the skin with a 10 blade and took the incision down to the thoracolumbar fascia. I exposed the lamina of L5, and S1 in a subperiosteal fashion bilaterally. I confirmed my location with an intraoperative xray.  I placed self retaining retractors and started the decompression.  I decompressed the spinal canal via a redo discetomy of L5/ S1 on the right side only. There was dense scar tissue which I used sharp and blunt dissection to remove and to expose the L5 nerve  root, the S1 nerve root, and the L5/S1 disc space. I removed the inferior facet of L5 which was beyond the lateral exposure needed for a plif. This ensured full decompression of the S1 and L5 roots.  A PLIF was performed at L5/S1 . I opened the disc space with a 15 blade then used a variety of instruments to open and  remove the disc and prepare the space for the arthrodesis. I used curettes, rongeurs, punches, distractors,shavers for the disc space, and rasps in the discetomy. I measured the disc space and placed one 10 x96mm  Peek cage(Synthes) into the disc space(s) filled with autograft morsels and allograft morsels placed in the disc space.  I decorticated the lateral bone at L5, and S1 on the left side. I then placed allograft morsels on the decorticated surfaces to complete the posterolateral arthrodesis.  We placed pedicle screws at L5, and S1, using fluoroscopic guidance. I drilled a pilot hole, then cannulated the pedicle with a bone probe at each site. We then tapped each pedicle, assessing each site for pedicle violations. No cutouts were appreciated. Screws Harlin Heys) were then placed at each site without difficulty. We attached rods and locking caps with the appropriate tools. The locking caps were secured with torque limited screwdrivers. Final films were performed and the final construct appeared to be in good position.  We closed the wound in a layered fashion. We approximated the thoracolumbar fascia, subcutaneous, and subcuticular planes with vicryl sutures. I used dermabond and an occlusive bandage for a sterile dressing.     PLAN OF CARE: Admit to inpatient   PATIENT DISPOSITION:  PACU - hemodynamically stable.   Delay start of Pharmacological VTE  agent (>24hrs) due to surgical blood loss or risk of bleeding:  yes

## 2016-05-22 LAB — GLUCOSE, CAPILLARY
GLUCOSE-CAPILLARY: 118 mg/dL — AB (ref 65–99)
GLUCOSE-CAPILLARY: 92 mg/dL (ref 65–99)
Glucose-Capillary: 197 mg/dL — ABNORMAL HIGH (ref 65–99)
Glucose-Capillary: 122 mg/dL — ABNORMAL HIGH (ref 65–99)
Glucose-Capillary: 190 mg/dL — ABNORMAL HIGH (ref 65–99)

## 2016-05-22 MED ORDER — OXYCODONE-ACETAMINOPHEN 5-325 MG PO TABS
1.0000 | ORAL_TABLET | ORAL | Status: DC | PRN
  Administered 2016-05-22 – 2016-05-23 (×8): 1 via ORAL
  Filled 2016-05-22 (×8): qty 1

## 2016-05-22 NOTE — Progress Notes (Signed)
Patient ID: Tabitha Estrada, female   DOB: 1970/07/09, 46 y.o.   MRN: [id number] Vital signs are stable Motor function is intact in lower extremities Patient complains of back soreness Dressing is clean and dry Does not feel ready for discharge today Encourage mobilization

## 2016-05-22 NOTE — Evaluation (Signed)
Physical Therapy Evaluation Patient Details Name: Tabitha Estrada MRN: [id number] DOB: January 27, 1971 Today's Date: 05/22/2016   History of Present Illness  46 y.o. female with a recurrent displaced disc eccentric to the right at L5/S1. She is admitted for a lumbar discetomy and fusion at L5/S1  Clinical Impression  Patient demonstrates deficits in functional mobility as indicated below. Will need continued skilled PT to address deficits and maximize function. Will see as indicated and progress as tolerated. Anticipate patient will be safe for d/c home with family and use of RW.    Follow Up Recommendations No PT follow up;Supervision/Assistance - 24 hour    Equipment Recommendations  None recommended by PT    Recommendations for Other Services       Precautions / Restrictions Precautions Precautions: Back Precaution Comments: verbally reviewed precautions with patient Required Braces or Orthoses: Spinal Brace Spinal Brace: Lumbar corset Restrictions Weight Bearing Restrictions: No      Mobility  Bed Mobility Overal bed mobility: Needs Assistance Bed Mobility: Rolling;Sit to Sidelying Rolling: Min assist       Sit to sidelying: Min guard General bed mobility comments: min assist for elevation on LEs, min guard for rolling, Vcs for technique and positioning  Transfers Overall transfer level: Needs assistance Equipment used: Rolling walker (2 wheeled) Transfers: Sit to/from Stand Sit to Stand: Min guard         General transfer comment: min guard for safety and stability  Ambulation/Gait Ambulation/Gait assistance: Min guard Ambulation Distance (Feet): 120 Feet Assistive device: Rolling walker (2 wheeled) Gait Pattern/deviations: Step-through pattern;Decreased stride length;Decreased dorsiflexion - right Gait velocity: decreased Gait velocity interpretation: Below normal speed for age/gender General Gait Details: patient slow and guarded with mobility, increased  time and effort to ambulated but overall steady with use of RW. Edcuated on increased cadence  Stairs            Wheelchair Mobility    Modified Rankin (Stroke Patients Only)       Balance Overall balance assessment: Needs assistance   Sitting balance-Leahy Scale: Good       Standing balance-Leahy Scale: Fair Standing balance comment: can static stand but has reliance on Rw for support/comfort                             Pertinent Vitals/Pain Pain Assessment: Faces Pain Score: 8  Pain Location: back, surgical site Pain Descriptors / Indicators: Aching;Grimacing;Operative site guarding Pain Intervention(s): Limited activity within patient's tolerance;Monitored during session    [place] expects to be discharged to:: Private residence Living Arrangements: Spouse/significant other Available Help at Discharge: Family Type of Home:  (going to her moms) Home Access: Level entry     Home Layout: One level Home Equipment: Environmental consultant - 2 wheels;Cane - single point;Bedside commode      Prior Function Level of Independence: Independent with assistive device(s) (ocassional use of single point cane)               Hand Dominance   Dominant Hand: Right    Extremity/Trunk Assessment   Upper Extremity Assessment Upper Extremity Assessment: Overall WFL for tasks assessed    Lower Extremity Assessment Lower Extremity Assessment: RLE deficits/detail RLE Deficits / Details: limited dorsiflexion RLE, (baseline deficits per patient)    Cervical / Trunk Assessment Cervical / Trunk Assessment:  (s/p spinal surgery)  Communication   Communication: No difficulties  Cognition Arousal/Alertness: Awake/alert Behavior During Therapy: WFL for tasks  assessed/performed Overall Cognitive Status: Within Functional Limits for tasks assessed                                        General Comments General comments (skin integrity,  edema, etc.): educated on mobility expectations, safety, precautions and pain management strategieis.    Exercises     Assessment/Plan    PT Assessment Patient needs continued PT services  PT Problem List Decreased activity tolerance;Decreased balance;Decreased mobility;Decreased knowledge of precautions;Pain       PT Treatment Interventions DME instruction;Gait training;Functional mobility training;Therapeutic activities;Therapeutic exercise;Balance training;Patient/family education    PT Goals (Current goals can be found in the Care Plan section)  Acute Rehab PT Goals Patient Stated Goal: to go home with family PT Goal Formulation: With patient/family Time For Goal Achievement: 06/05/16 Potential to Achieve Goals: Good    Frequency Min 5X/week   Barriers to discharge        Co-evaluation               End of Session Equipment Utilized During Treatment: Gait belt;Back brace Activity Tolerance: Patient tolerated treatment well Patient left: in bed;with call bell/phone within reach;with family/visitor present Nurse Communication: Mobility status PT Visit Diagnosis: Difficulty in walking, not elsewhere classified (R26.2)    Time: 3354-5625 PT Time Calculation (min) (ACUTE ONLY): 19 min   Charges:   PT Evaluation $PT Eval Moderate Complexity: 1 Procedure     PT G Codes:        Alben Deeds, PT DPT  [phone]   Duncan Dull 05/22/2016, 9:39 AM

## 2016-05-22 NOTE — Evaluation (Signed)
Occupational Therapy Evaluation Patient Details Name: Tabitha Estrada MRN: [id number] DOB: [birth year] Today's Date: 05/22/2016    History of Present Illness 46 y.o. female with a recurrent displaced disc eccentric to the right at L5/S1. She is admitted for a lumbar discetomy and fusion at L5/S1   Clinical Impression   Pt reports she required min assist for LB dressing PTA. Currently pt overall min guard for functional mobility and ADL with the exception of mod assist for LB ADL. Began back, safety, and ADL education with pt and family. Pt planning to d/c home with 24/7 supervision from family initially. Pt would benefit from continued skilled OT to address established goals.    Follow Up Recommendations  No OT follow up;Supervision/Assistance - 24 hour (initially)    Equipment Recommendations  None recommended by OT    Recommendations for Other Services       Precautions / Restrictions Precautions Precautions: Back Precaution Booklet Issued: Yes (comment) Precaution Comments: Pt able to recall 2/3 precautions, reviewed all with pt and husband Required Braces or Orthoses: Spinal Brace Spinal Brace: Lumbar corset;Applied in sitting position Restrictions Weight Bearing Restrictions: No      Mobility Bed Mobility Overal bed mobility: Needs Assistance Bed Mobility: Sidelying to Sit;Sit to Sidelying   Sidelying to sit: Min assist     Sit to sidelying: Min assist General bed mobility comments: Pt in sidelying upon arrival. Min assist for trunk elevation to sitting. Min assist for LEs back to bed. Increased time required with HOB slightly elevated and heavy use of bed rails  Transfers Overall transfer level: Needs assistance Equipment used: Rolling walker (2 wheeled) Transfers: Sit to/from Stand Sit to Stand: Min guard         General transfer comment: for safety. Increased time required with cues for hand placement    Balance Overall balance assessment: Needs  assistance   Sitting balance-Leahy Scale: Good       Standing balance-Leahy Scale: Poor Standing balance comment: RW for support                           ADL either performed or assessed with clinical judgement   ADL Overall ADL's : Needs assistance/impaired Eating/Feeding: Set up;Sitting   Grooming: Min guard;Standing Grooming Details (indicate cue type and reason): Educated on use of 2 cups for oral care Upper Body Bathing: Min guard;Sitting   Lower Body Bathing: Moderate assistance;Sit to/from stand   Upper Body Dressing : Supervision/safety;Sitting Upper Body Dressing Details (indicate cue type and reason): to don/doff gown and brace Lower Body Dressing: Moderate assistance;Sit to/from stand Lower Body Dressing Details (indicate cue type and reason): Pt unable to cross foot over opposite knee, husband/mother to assist as needed Toilet Transfer: Min guard;Ambulation;RW Toilet Transfer Details (indicate cue type and reason): Simulated by sit to stand from EOB with functional mobility in room   Toileting - Clothing Manipulation Details (indicate cue type and reason): Educated on what she can use as a toilet aide and use of wet wipes, husband to assist as needed   Clinical cytogeneticist Details (indicate cue type and reason): Educated on use of 3 in 1 in tub as a set; plan to practice tub transfer to 3 in 1 next session Functional mobility during ADLs: Min guard;Rolling walker General ADL Comments: Educated pt on maintaining back precuations during functional activities, log roll technique for bed mobility, frequent mobility throughout the day upon return home.     Vision  Perception     Praxis      Pertinent Vitals/Pain Pain Assessment: 0-10 Pain Score: 8  Pain Location: back Pain Descriptors / Indicators: Aching;Sore Pain Intervention(s): Monitored during session;Limited activity within patient's tolerance;Premedicated before session     Hand  Dominance Right   Extremity/Trunk Assessment Upper Extremity Assessment Upper Extremity Assessment: Overall WFL for tasks assessed   Lower Extremity Assessment Lower Extremity Assessment: Defer to PT evaluation   Cervical / Trunk Assessment Cervical / Trunk Assessment: Other exceptions Cervical / Trunk Exceptions: s/p spinal sx   Communication Communication Communication: No difficulties   Cognition Arousal/Alertness: Awake/alert Behavior During Therapy: WFL for tasks assessed/performed Overall Cognitive Status: Within Functional Limits for tasks assessed                                     General Comments       Exercises     Shoulder Instructions      Home Living Family/patient expects to be discharged to:: Private residence Living Arrangements: Spouse/significant other;Parent (planning to stay at mothers house initially) Available Help at Discharge: Family;Available 24 hours/day Type of Home: House Home Access: Level entry     Home Layout: One level     Bathroom Shower/Tub: Teacher, early years/pre: Handicapped height     Home Equipment: Environmental consultant - 2 wheels;Cane - single point;Bedside commode          Prior Functioning/Environment Level of Independence: Independent with assistive device(s)        Comments: occasional use of SPC, assist with donning/doffing shoes        OT Problem List: Decreased strength;Impaired balance (sitting and/or standing);Decreased knowledge of use of DME or AE;Decreased knowledge of precautions;Obesity;Pain      OT Treatment/Interventions: Self-care/ADL training;Energy conservation;DME and/or AE instruction;Therapeutic activities;Patient/family education    OT Goals(Current goals can be found in the care plan section) Acute Rehab OT Goals Patient Stated Goal: to go home with family OT Goal Formulation: With patient/family Time For Goal Achievement: 06/05/16 Potential to Achieve Goals: Good ADL  Goals Pt Will Transfer to Toilet: with supervision;ambulating;bedside commode Pt Will Perform Toileting - Clothing Manipulation and hygiene: with supervision;sit to/from stand;with adaptive equipment Pt Will Perform Tub/Shower Transfer: Tub transfer;with supervision;ambulating;3 in 1;rolling walker Additional ADL Goal #1: Pt will independently verbally recall 3/3 back precautions and maintain throughout ADL. Additional ADL Goal #2: Pt will perform bed mobility at supervision level without use of elevated HOB or bed rails.  OT Frequency: Min 2X/week   Barriers to D/C:            Co-evaluation              End of Session Equipment Utilized During Treatment: Rolling walker;Back brace Nurse Communication: Mobility status  Activity Tolerance: Patient tolerated treatment well Patient left: in bed;with call bell/phone within reach;with family/visitor present  OT Visit Diagnosis: Unsteadiness on feet (R26.81)                Time: 8563-1497 OT Time Calculation (min): 14 min Charges:  OT General Charges $OT Visit: 1 Procedure OT Evaluation $OT Eval Moderate Complexity: 1 Procedure G-Codes:     [place]ope Pinkett A. Ulice Brilliant, M.S., OTR/L Pager: [place] 05/22/2016, 1:57 PM

## 2016-05-23 LAB — GLUCOSE, CAPILLARY
Glucose-Capillary: 216 mg/dL — ABNORMAL HIGH (ref 65–99)
Glucose-Capillary: 180 mg/dL — ABNORMAL HIGH (ref 65–99)

## 2016-05-23 MED ORDER — OXYCODONE-ACETAMINOPHEN 5-325 MG PO TABS: 1.0000 | ORAL_TABLET | ORAL | 0 refills | Status: DC | PRN

## 2016-05-23 MED ORDER — DOCUSATE SODIUM 100 MG PO CAPS: 100.0000 mg | ORAL_CAPSULE | Freq: Two times a day (BID) | ORAL | 0 refills | Status: DC

## 2016-05-23 NOTE — Discharge Summary (Signed)
Physician Discharge Summary  Patient ID: Tabitha Estrada MRN: [id number] DOB/AGE: August 08, 1970 46 y.o.  Admit date: 05/21/2016 Discharge date: 05/23/2016  Admission Diagnoses:L5-S1 herniated disc, lumbago, lumbar radiculopathy  Discharge Diagnoses: The same Active Problems:   Recurrent herniation of lumbar disc   Discharged Condition: good  Hospital Course: Dr. Cyndy Freeze performed an L5-S1 decompression, instrumentation, and fusion on the patient on 05/21/2016.  The patient's postoperative course was unremarkable. On postoperative day #2 she requested discharge to home. She was given written and oral discharge instructions. All her questions were answered.  Consults: None Significant Diagnostic Studies: None Treatments: L5-S1 decompression, instrumentation, and fusion. Discharge Exam: Blood pressure (!) 108/55, pulse 86, temperature 99.1 F (37.3 C), temperature source Oral, resp. rate 18, height 5\' 5"  (1.651 m), weight 123.2 kg (271 lb 11.2 oz), last menstrual period 05/21/2016, SpO2 97 %. The patient is alert and pleasant. Her strength is grossly normal lower extremities.  Disposition: Home  Discharge Instructions    Call MD for:  difficulty breathing, headache or visual disturbances    Complete by:  As directed    Call MD for:  extreme fatigue    Complete by:  As directed    Call MD for:  hives    Complete by:  As directed    Call MD for:  persistant dizziness or light-headedness    Complete by:  As directed    Call MD for:  persistant nausea and vomiting    Complete by:  As directed    Call MD for:  redness, tenderness, or signs of infection (pain, swelling, redness, odor or green/yellow discharge around incision site)    Complete by:  As directed    Call MD for:  severe uncontrolled pain    Complete by:  As directed    Call MD for:  temperature >100.4    Complete by:  As directed    Diet - low sodium heart healthy    Complete by:  As directed    Discharge instructions     Complete by:  As directed    Call [phone] for a followup appointment. Take a stool softener while you are using pain medications.   Driving Restrictions    Complete by:  As directed    Do not drive for 2 weeks.   Increase activity slowly    Complete by:  As directed    Lifting restrictions    Complete by:  As directed    Do not lift more than 5 pounds. No excessive bending or twisting.   May shower / Bathe    Complete by:  As directed    He may shower after the pain she is removed 3 days after surgery. Leave the incision alone.   Remove dressing in 24 hours    Complete by:  As directed      Allergies as of 05/23/2016      Reactions   Dulaglutide Other (See Comments)   Sulfur Nausea And Vomiting   Metformin And Related Diarrhea      Medication List    STOP taking these medications   ibuprofen 200 MG tablet Commonly known as:  ADVIL,MOTRIN     TAKE these medications   albuterol 108 (90 Base) MCG/ACT inhaler Commonly known as:  PROVENTIL HFA;VENTOLIN HFA Inhale 1-2 puffs into the lungs every 4 (four) hours as needed for wheezing or shortness of breath.   atorvastatin 40 MG tablet Commonly known as:  LIPITOR Take 40 mg by mouth every evening.  BLOOD GLUCOSE TEST STRIPS Strp 1 each by Other route 3 (three) times a day. Use as instructed   cetirizine 10 MG tablet Commonly known as:  ZYRTEC Take 1 tablet (10 mg total) by mouth 2 (two) times daily.   docusate sodium 100 MG capsule Commonly known as:  COLACE Take 1 capsule (100 mg total) by mouth 2 (two) times daily.   gabapentin 400 MG capsule Commonly known as:  NEURONTIN Take 3 capsules (1,200 mg total) by mouth 3 (three) times daily as needed (neuropathic pain). Max dose 3600 mg /24 hours   Insulin Pen Needle 32G X 4 MM Misc by Does not apply route.   ipratropium 0.03 % nasal spray Commonly known as:  ATROVENT Place 2 sprays into both nostrils 3 (three) times daily as needed for rhinitis.    ipratropium-albuterol 0.5-2.5 (3) MG/3ML Soln Commonly known as:  DUONEB Take 3 mLs by nebulization every 2 (two) hours as needed (wheeze, SOB).   NOVOLOG FLEXPEN 100 UNIT/ML FlexPen Generic drug:  insulin aspart Inject 28 Units into the skin 3 (three) times daily with meals.   nystatin powder Commonly known as:  MYCOSTATIN/NYSTOP Apply 1 g topically 2 (two) times daily as needed.   omeprazole 20 MG capsule Commonly known as:  PRILOSEC TAKE 1 CAPSULE ONCE DAILY   ondansetron 8 MG disintegrating tablet Commonly known as:  ZOFRAN-ODT Take 1 tablet (8 mg total) by mouth every 8 (eight) hours as needed for nausea.   oxyCODONE-acetaminophen 5-325 MG tablet Commonly known as:  PERCOCET/ROXICET Take 1-2 tablets by mouth every 4 (four) hours as needed for severe pain. What changed:  how much to take  when to take this  reasons to take this   TRESIBA FLEXTOUCH 200 UNIT/ML Sopn Generic drug:  Insulin Degludec Inject 40 Units into the skin 2 (two) times daily.   valACYclovir 500 MG tablet Commonly known as:  VALTREX Take 500 mg by mouth at bedtime.        SignedOphelia Charter 05/23/2016, 11:21 AM

## 2016-05-23 NOTE — Progress Notes (Signed)
qPhysical Therapy Treatment Patient Details Name: Tabitha Estrada MRN: [id number] DOB: August 29, 1970 Today's Date: 05/23/2016    History of Present Illness 46 y.o. female with a recurrent displaced disc eccentric to the right at L5/S1. She is admitted for a lumbar discetomy and fusion at L5/S1    PT Comments    Patient progressing well with mobility, modified independent with techniques, good compliance with precautions, feel pt will be safe for d/c home. No further acute PT needs, will sign off.   Follow Up Recommendations  No PT follow up;Supervision/Assistance - 24 hour     Equipment Recommendations  None recommended by PT    Recommendations for Other Services       Precautions / Restrictions Precautions Precautions: Back Precaution Comments: verbally reviewed precautions with patient Required Braces or Orthoses: Spinal Brace Spinal Brace: Lumbar corset Restrictions Weight Bearing Restrictions: No    Mobility  Bed Mobility               General bed mobility comments: received EOB  Transfers Overall transfer level: Modified independent Equipment used: Rolling walker (2 wheeled) Transfers: Sit to/from Stand              Ambulation/Gait Ambulation/Gait assistance: Modified independent (Device/Increase time) Ambulation Distance (Feet): 340 Feet Assistive device: Rolling walker (2 wheeled) Gait Pattern/deviations: Step-through pattern;Decreased stride length;Decreased dorsiflexion - right         Stairs            Wheelchair Mobility    Modified Rankin (Stroke Patients Only)       Balance Overall balance assessment: Needs assistance   Sitting balance-Leahy Scale: Good       Standing balance-Leahy Scale: Good                              Cognition Arousal/Alertness: Awake/alert Behavior During Therapy: WFL for tasks assessed/performed Overall Cognitive Status: Within Functional Limits for tasks assessed                                        Exercises      General Comments General comments (skin integrity, edema, etc.): educated on car transfers and technique for getting into high level bed.       Pertinent Vitals/Pain Pain Assessment: 0-10 Pain Score: 6  Pain Location: back, surgical site Pain Descriptors / Indicators: Sore Pain Intervention(s): Monitored during session    Home Living                      Prior Function            PT Goals (current goals can now be found in the care plan section) Acute Rehab PT Goals Patient Stated Goal: to go home with family PT Goal Formulation: With patient/family Time For Goal Achievement: 06/05/16 Potential to Achieve Goals: Good Progress towards PT goals: Progressing toward goals    Frequency    Min 5X/week      PT Plan Current plan remains appropriate    Co-evaluation             End of Session Equipment Utilized During Treatment: Gait belt;Back brace Activity Tolerance: Patient tolerated treatment well Patient left: in chair;with family/visitor present Nurse Communication: Mobility status PT Visit Diagnosis: Difficulty in walking, not elsewhere classified (R26.2)     Time: 7989-2119 PT  Time Calculation (min) (ACUTE ONLY): 15 min  Charges:  $Gait Training: 8-22 mins                    G Codes:          Duncan Dull 05/23/2016, [place], PT DPT  [phone]

## 2016-05-23 NOTE — Progress Notes (Signed)
Pt discharged to home with family via wheelchair. Home instructions given. Follow up appointment instructions given.

## 2016-05-23 NOTE — Progress Notes (Signed)
Occupational Therapy Treatment Patient Details Name: Tabitha Estrada MRN: [id number] DOB: [birth year] Today's Date: 05/23/2016    History of present illness 46 y.o. female with a recurrent displaced disc eccentric to the right at L5/S1. She is admitted for a lumbar discetomy and fusion at L5/S1   OT comments  Pt able to perform tub transfer to 3 in 1 with min assist; pt and husband educated on set up of 3 in 1 in tub and tub transfer technique. Pt able to recall precautions and maintain throughout session. D/c plan remains appropriate. Will continue to follow acutely.   Follow Up Recommendations  No OT follow up;Supervision/Assistance - 24 hour (initially)    Equipment Recommendations  None recommended by OT    Recommendations for Other Services      Precautions / Restrictions Precautions Precautions: Back Precaution Comments: Able to recall precautions and maintain throughout ADL Required Braces or Orthoses: Spinal Brace Spinal Brace: Lumbar corset Restrictions Weight Bearing Restrictions: No       Mobility Bed Mobility               General bed mobility comments: Pt standing in room upon arrival  Transfers Overall transfer level: Modified independent Equipment used: Rolling walker (2 wheeled) Transfers: Sit to/from Stand Sit to Stand: Modified independent (Device/Increase time)              Balance Overall balance assessment: Needs assistance   Sitting balance-Leahy Scale: Good       Standing balance-Leahy Scale: Good                             ADL either performed or assessed with clinical judgement   ADL Overall ADL's : Needs assistance/impaired                                 Tub/ Shower Transfer: Minimal assistance;Tub transfer;Ambulation;3 in 1;Rolling walker Tub/Shower Transfer Details (indicate cue type and reason): Educated pt and husband on positioning of 3 in 1 in tub and tub transfer technique. Pt required  min assist to guide feet over edge of tub. Husband to assist as needed. Functional mobility during ADLs: Supervision/safety;Rolling walker       Vision       Perception     Praxis      Cognition Arousal/Alertness: Awake/alert Behavior During Therapy: WFL for tasks assessed/performed Overall Cognitive Status: Within Functional Limits for tasks assessed                                          Exercises     Shoulder Instructions       General Comments educated on car transfers and technique for getting into high level bed.     Pertinent Vitals/ Pain       Pain Assessment: Faces Pain Score: 6  Faces Pain Scale: Hurts even more Pain Location: back  Pain Descriptors / Indicators: Sore Pain Intervention(s): Monitored during session;Premedicated before session  Home Living                                          Prior Functioning/Environment  Frequency  Min 2X/week        Progress Toward Goals  OT Goals(current goals can now be found in the care plan section)  Progress towards OT goals: Progressing toward goals  Acute Rehab OT Goals Patient Stated Goal: to go home with family OT Goal Formulation: With patient/family  Plan Discharge plan remains appropriate    Co-evaluation                 End of Session Equipment Utilized During Treatment: Rolling walker;Back brace  OT Visit Diagnosis: Unsteadiness on feet (R26.81)   Activity Tolerance Patient tolerated treatment well   Patient Left Other (comment) (standing in room with husband)   Nurse Communication          Time: 1125-1140 OT Time Calculation (min): 15 min  Charges: OT General Charges $OT Visit: 1 Procedure OT Treatments $Self Care/Home Management : 8-22 mins  Jaysion Ramseyer A. Ulice Brilliant, M.S., OTR/L Pager: [place] 05/23/2016, 12:14 PM

## 2016-05-26 ENCOUNTER — Other Ambulatory Visit: Payer: Self-pay | Admitting: Neurosurgery

## 2016-05-26 DIAGNOSIS — M5127 Other intervertebral disc displacement, lumbosacral region: Secondary | ICD-10-CM

## 2016-05-28 ENCOUNTER — Ambulatory Visit: Payer: 59 | Admitting: Hematology & Oncology

## 2016-05-28 ENCOUNTER — Other Ambulatory Visit: Payer: 59

## 2016-05-28 ENCOUNTER — Ambulatory Visit: Payer: 59

## 2016-06-08 ENCOUNTER — Ambulatory Visit (INDEPENDENT_AMBULATORY_CARE_PROVIDER_SITE_OTHER): Payer: 59

## 2016-06-08 ENCOUNTER — Telehealth: Payer: Self-pay | Admitting: Emergency Medicine

## 2016-06-08 DIAGNOSIS — M4326 Fusion of spine, lumbar region: Secondary | ICD-10-CM

## 2016-06-08 DIAGNOSIS — M5127 Other intervertebral disc displacement, lumbosacral region: Secondary | ICD-10-CM

## 2016-06-08 MED ORDER — FLUCONAZOLE 150 MG PO TABS: 150.0000 mg | ORAL_TABLET | Freq: Once | ORAL | 1 refills | Status: AC

## 2016-06-08 NOTE — Telephone Encounter (Signed)
Patient calling to report return of bright red rash under breasts; using rx powder but wonders if something else would be better; requested diflucan but has not heard back.

## 2016-06-08 NOTE — Telephone Encounter (Signed)
Would continue the powder. Called in [place] as well. When rash improves, I would probably recommend using baby powder under the breast area routinely to hopefully avoid getting the rash

## 2016-06-09 NOTE — Telephone Encounter (Signed)
Pt advised of results and recommendation.

## 2016-06-11 ENCOUNTER — Ambulatory Visit (INDEPENDENT_AMBULATORY_CARE_PROVIDER_SITE_OTHER): Payer: 59 | Admitting: Physician Assistant

## 2016-06-11 VITALS — BP 144/83 | HR 96 | Wt 264.0 lb

## 2016-06-11 DIAGNOSIS — R1084 Generalized abdominal pain: Secondary | ICD-10-CM

## 2016-06-11 LAB — POCT URINALYSIS DIPSTICK
Bilirubin, UA: NEGATIVE
Glucose, UA: NEGATIVE
Ketones, UA: NEGATIVE
Leukocytes, UA: NEGATIVE
Nitrite, UA: NEGATIVE
PH UA: 6.5 (ref 5.0–8.0)
PROTEIN UA: NEGATIVE
RBC UA: NEGATIVE
SPEC GRAV UA: 1.015 (ref 1.010–1.025)
UROBILINOGEN UA: 0.2 U/dL

## 2016-06-11 NOTE — Progress Notes (Signed)
HPI:                                                                Tabitha Estrada is a 46 y.o. female who presents to [place]: [place] today for abdominal pain  Abdominal Pain  This is a new problem. The current episode started in the past 7 days (2 days ago). The onset quality is gradual. The problem occurs constantly. The problem has been gradually worsening. The pain is located in the generalized abdominal region. The pain is at a severity of 6/10. The quality of the pain is sharp. Associated symptoms include anorexia and constipation (last BM was 3 days ago). Pertinent negatives include no belching, diarrhea, dysuria, fever, flatus, frequency, hematochezia, hematuria, melena, myalgias, nausea or vomiting. Nothing aggravates the pain. Relieved by: wearing back brace/belt. She has tried proton pump inhibitors and oral narcotic analgesics for the symptoms. Her past medical history is significant for abdominal surgery, GERD, irritable bowel syndrome and PUD.     Past Medical History:  Diagnosis Date  . Anemia   . Anxiety   . Arthritis   . Asthma   . Depression   . Diabetes mellitus without complication ([place])   . Dyspnea    with exertion and bronchititis  . Fibromyalgia   . GERD (gastroesophageal reflux disease)   . History of hiatal hernia   . History of kidney stones   . Hyperlipidemia   . Pneumonia    hs, 01-19-15  . Sleep apnea    has Cpap have not been using   Past Surgical History:  Procedure Laterality Date  . BACK SURGERY  2015,2016  . CARPAL TUNNEL RELEASE Right 2008  . CHOLECYSTECTOMY  1992  . KIDNEY STONE SURGERY  2015  . TONSILLECTOMY  1979  . TUBAL LIGATION  2005   Social History  Substance Use Topics  . Smoking status: Current Every Day Smoker    Packs/day: 0.50    Years: 32.00    Types: Cigarettes  . Smokeless tobacco: Never Used     Comment: 05/17/2016 still smokes  . Alcohol use No   family history  includes Cancer in her maternal grandfather; Diabetes in her father and paternal aunt; Emphysema in her maternal grandmother; Heart attack in her paternal uncle; Heart failure in her maternal grandmother; Hyperlipidemia in her father; Hypertension in her father and paternal aunt.  ROS: negative except as noted in the HPI  Medications: Current Outpatient Prescriptions  Medication Sig Dispense Refill  . albuterol (PROVENTIL HFA;VENTOLIN HFA) 108 (90 Base) MCG/ACT inhaler Inhale 1-2 puffs into the lungs every 4 (four) hours as needed for wheezing or shortness of breath. 1 Inhaler 12  . atorvastatin (LIPITOR) 40 MG tablet Take 40 mg by mouth every evening.     . cetirizine (ZYRTEC) 10 MG tablet Take 1 tablet (10 mg total) by mouth 2 (two) times daily. 60 tablet 2  . docusate sodium (COLACE) 100 MG capsule Take 1 capsule (100 mg total) by mouth 2 (two) times daily. 60 capsule 0  . gabapentin (NEURONTIN) 400 MG capsule Take 3 capsules (1,200 mg total) by mouth 3 (three) times daily as needed (neuropathic pain). Max dose 3600 mg /24 hours 270 capsule 5  . Glucose  Blood (BLOOD GLUCOSE TEST STRIPS) STRP 1 each by Other route 3 (three) times a day. Use as instructed    . insulin aspart (NOVOLOG FLEXPEN) 100 UNIT/ML FlexPen Inject 28 Units into the skin 3 (three) times daily with meals.    . Insulin Degludec (TRESIBA FLEXTOUCH) 200 UNIT/ML SOPN Inject 40 Units into the skin 2 (two) times daily.    . Insulin Pen Needle 32G X 4 MM MISC by Does not apply route.    Marland Kitchen ipratropium (ATROVENT) 0.03 % nasal spray Place 2 sprays into both nostrils 3 (three) times daily as needed for rhinitis. 30 mL 1  . ipratropium-albuterol (DUONEB) 0.5-2.5 (3) MG/3ML SOLN Take 3 mLs by nebulization every 2 (two) hours as needed (wheeze, SOB). 60 mL 3  . nystatin (MYCOSTATIN/NYSTOP) powder Apply 1 g topically 2 (two) times daily as needed.    Marland Kitchen omeprazole (PRILOSEC) 20 MG capsule TAKE 1 CAPSULE ONCE DAILY    . ondansetron  (ZOFRAN-ODT) 8 MG disintegrating tablet Take 1 tablet (8 mg total) by mouth every 8 (eight) hours as needed for nausea. 15 tablet 0  . oxyCODONE-acetaminophen (PERCOCET/ROXICET) 5-325 MG tablet Take 1-2 tablets by mouth every 4 (four) hours as needed for severe pain. 50 tablet 0  . valACYclovir (VALTREX) 500 MG tablet Take 500 mg by mouth at bedtime.      No current facility-administered medications for this visit.    Allergies  Allergen Reactions  . Dulaglutide Other (See Comments)  . Sulfur Nausea And Vomiting  . Metformin And Related Diarrhea       Objective:  BP (!) 144/83   Pulse 96   Wt 264 lb (119.7 kg)   LMP 05/21/2016   BMI 43.93 kg/m  Gen: well-groomed, cooperative, not ill-appearing, no distress HEENT: normal conjunctiva, no icterus, oropharynx clear, moist mucus membranes Pulm: Normal work of breathing, normal phonation, clear to auscultation bilaterally CV: Normal rate, regular rhythm, s1 and s2 distinct, no murmurs, clicks or rubs  GI: abdomen obese, bowel sounds present, diffusely tender, no rebound, no guarding Neuro: alert and oriented x 3, EOM's intact Skin: warm and dry, no rash, no jaundice   No results found for this or any previous visit (from the past 72 hour(s)). Dg Lumbar Spine 2-3 Views  Result Date: 06/08/2016 CLINICAL DATA:  Status post lumbar fusion. EXAM: LUMBAR SPINE - 2-3 VIEW COMPARISON:  None. FINDINGS: Status post surgical posterior fusion of L5-S1 with bilateral intrapedicular screw placement and fusion of posterior interspinous space is well. No fracture or spondylolisthesis is noted. Mild degenerative disc disease is noted at L1-2 and L2-3. No fracture or spondylolisthesis is noted. Good alignment of vertebral bodies is noted. Possible right renal calculus. IMPRESSION: Postsurgical and degenerative changes as described above. Possible right renal calculus. Electronically Signed   By: Marijo Conception, M.D.   On: 06/08/2016 15:14       Assessment and Plan: 46 y.o. female with PMH of DMII, GERD, IBS with  1. Generalized abdominal pain - POCT Urinalysis Dipstick negative - high suspicion pain is 2/2 opioid induced constipation as patient has been taking oxycodone daily for a recent spinal fusion and has only been using stool softeners. She also has Type II DM, so may be a gastroparesis component - checking labs - recommend she continue stool softeners and add stimulant laxative. Fleet enema if no BM within 48 hours - transition to Ibuprofen for back pain - CBC - Comprehensive metabolic panel - Amylase - Lipase   Patient  education and anticipatory guidance given Patient agrees with treatment plan Follow-up with PCP as needed if symptoms worsen or fail to improve  Darlyne Russian PA-C

## 2016-06-11 NOTE — Patient Instructions (Addendum)
Docusate Sodium (stool softener) 200mg  twice a day Miralax (1 tablespoon dissolved in 4-8 ounces of beverage) daily for the next 3-5 days Fleet enema if no BM within 48 hours Ibuprofen 800mg  (4 pills) every 8 hours as needed for back pain   Abdominal Pain, Adult Abdominal pain can be caused by many things. Often, abdominal pain is not serious and it gets better with no treatment or by being treated at home. However, sometimes abdominal pain is serious. Your health care provider will do a medical history and a physical exam to try to determine the cause of your abdominal pain. Follow these instructions at home:  Take over-the-counter and prescription medicines only as told by your health care provider. Do not take a laxative unless told by your health care provider.  Drink enough fluid to keep your urine clear or pale yellow.  Watch your condition for any changes.  Keep all follow-up visits as told by your health care provider. This is important. Contact a health care provider if:  Your abdominal pain changes or gets worse.  You are not hungry or you lose weight without trying.  You are constipated or have diarrhea for more than 2-3 days.  You have pain when you urinate or have a bowel movement.  Your abdominal pain wakes you up at night.  Your pain gets worse with meals, after eating, or with certain foods.  You are throwing up and cannot keep anything down.  You have a fever. Get help right away if:  Your pain does not go away as soon as your health care provider told you to expect.  You cannot stop throwing up.  Your pain is only in areas of the abdomen, such as the right side or the left lower portion of the abdomen.  You have bloody or black stools, or stools that look like tar.  You have severe pain, cramping, or bloating in your abdomen.  You have signs of dehydration, such as:  Dark urine, very little urine, or no urine.  Cracked lips.  Dry mouth.  Sunken  eyes.  Sleepiness.  Weakness. This information is not intended to replace advice given to you by your health care provider. Make sure you discuss any questions you have with your health care provider. Document Released: 11/25/2004 Document Revised: 09/05/2015 Document Reviewed: 07/30/2015 Elsevier Interactive Patient Education  2017 [employer].

## 2016-06-18 ENCOUNTER — Other Ambulatory Visit: Payer: Self-pay

## 2016-06-18 MED ORDER — OMEPRAZOLE 20 MG PO CPDR: 20.0000 mg | DELAYED_RELEASE_CAPSULE | Freq: Every day | ORAL | 1 refills | Status: DC

## 2016-06-18 NOTE — Telephone Encounter (Signed)
Refill request for Omeprazole 20 mg. #[phone] 1 refill sent to [facility] Stanisha Lorenz,CMA

## 2016-07-29 ENCOUNTER — Telehealth: Payer: 59 | Admitting: Physician Assistant

## 2016-07-29 ENCOUNTER — Other Ambulatory Visit: Payer: Self-pay | Admitting: Osteopathic Medicine

## 2016-07-29 DIAGNOSIS — N760 Acute vaginitis: Secondary | ICD-10-CM

## 2016-07-29 DIAGNOSIS — B379 Candidiasis, unspecified: Secondary | ICD-10-CM

## 2016-07-29 MED ORDER — FLUCONAZOLE 150 MG PO TABS: 150.0000 mg | ORAL_TABLET | Freq: Once | ORAL | 0 refills | Status: AC

## 2016-07-29 NOTE — Progress Notes (Signed)

## 2016-08-03 ENCOUNTER — Other Ambulatory Visit: Payer: Self-pay | Admitting: Neurosurgery

## 2016-08-03 ENCOUNTER — Ambulatory Visit (INDEPENDENT_AMBULATORY_CARE_PROVIDER_SITE_OTHER): Payer: 59

## 2016-08-03 DIAGNOSIS — M25551 Pain in right hip: Secondary | ICD-10-CM

## 2016-08-06 ENCOUNTER — Other Ambulatory Visit: Payer: Self-pay | Admitting: Osteopathic Medicine

## 2016-08-06 DIAGNOSIS — B379 Candidiasis, unspecified: Secondary | ICD-10-CM

## 2016-08-17 ENCOUNTER — Encounter: Payer: Self-pay | Admitting: Osteopathic Medicine

## 2016-08-17 ENCOUNTER — Ambulatory Visit (INDEPENDENT_AMBULATORY_CARE_PROVIDER_SITE_OTHER): Payer: 59 | Admitting: Osteopathic Medicine

## 2016-08-17 VITALS — BP 146/85 | HR 91 | Ht 65.0 in | Wt 261.0 lb

## 2016-08-17 DIAGNOSIS — Z860100 Personal history of colon polyps, unspecified: Secondary | ICD-10-CM

## 2016-08-17 DIAGNOSIS — E11628 Type 2 diabetes mellitus with other skin complications: Secondary | ICD-10-CM

## 2016-08-17 DIAGNOSIS — N760 Acute vaginitis: Secondary | ICD-10-CM

## 2016-08-17 DIAGNOSIS — J4540 Moderate persistent asthma, uncomplicated: Secondary | ICD-10-CM | POA: Diagnosis not present

## 2016-08-17 DIAGNOSIS — J018 Other acute sinusitis: Secondary | ICD-10-CM | POA: Diagnosis not present

## 2016-08-17 DIAGNOSIS — Z794 Long term (current) use of insulin: Secondary | ICD-10-CM

## 2016-08-17 MED ORDER — FLUCONAZOLE 150 MG PO TABS: 150.0000 mg | ORAL_TABLET | Freq: Once | ORAL | 0 refills | Status: AC

## 2016-08-17 MED ORDER — BECLOMETHASONE DIPROPIONATE 80 MCG/ACT IN AERS: 2.0000 | INHALATION_SPRAY | Freq: Every day | RESPIRATORY_TRACT | 12 refills | Status: DC

## 2016-08-17 MED ORDER — AMOXICILLIN-POT CLAVULANATE 875-125 MG PO TABS: 1.0000 | ORAL_TABLET | Freq: Two times a day (BID) | ORAL | 0 refills | Status: AC

## 2016-08-17 MED ORDER — FLUTICASONE PROPIONATE 50 MCG/ACT NA SUSP: 2.0000 | Freq: Every day | NASAL | 3 refills | Status: DC

## 2016-08-17 NOTE — Progress Notes (Signed)
HPI: Tabitha Estrada is a 46 y.o. female  who presents to [place] today, 08/17/16,  for chief complaint of:  Chief Complaint  Patient presents with  . Sinus Problem  . Vaginal Discharge    Sinus congestion: Drainage and congestion for about the past month or so. Has tried intermittent use of cetirizine, inhalers with albuterol, Mucinex, was trying Flonase for a while but has been out of this and is not using nasal saline. Sinus congestion without fever. Bilateral. Sneezing and clear nasal drainage.  Vaginal: Yeast infection is back.  Diabetes: Following with endocrinology though she did not particularly care for the endocrinologist she is currently seen so she has not been back. Due for A1c recheck. Last A1c indicated poor control, I do not have recent endocrinology records available for review.  History of colon polyps: With like to be referred to local GI doctor, states that she gets colonoscopies done every few years to monitor this condition.    Past medical history, surgical history, social history and family history reviewed.  Patient Active Problem List   Diagnosis Date Noted  . 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 Leukemoid reaction 05/17/2016  . Vitamin D deficiency 03/21/2016  . Cough 12/24/2015  . Type 2 diabetes mellitus with hyperglycemia ([place]) 12/24/2015  . Pruritus 12/24/2015  . Reactive airway disease without complication 76/54/6503  . Ethmoid sinusitis 12/24/2015  . Yeast infection 12/24/2015  . Urinary frequency 12/24/2015  . 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 Lumbar disc prolapse with root compression 03/19/2014  . 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 Vitamin B12 deficiency 02/01/2014  . Left ureteral stone 12/08/2013  . Lumbar disc herniation 10/07/2013  . Asthma 05/08/2013  . 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 IBS (irritable bowel syndrome)  03/28/2011  . History of tobacco use 02/04/2011  . Obstructive sleep apnea syndrome in adult 02/04/2011  . Hypertension 12/23/2010  . OSA on CPAP 04/23/2009    Current medication list and allergy/intolerance information reviewed.   Current Outpatient Prescriptions on File Prior to Visit  Medication Sig Dispense Refill  . albuterol (PROVENTIL HFA;VENTOLIN HFA) 108 (90 Base) MCG/ACT inhaler Inhale 1-2 puffs into the lungs every 4 (four) hours as needed for wheezing or shortness of breath. 1 Inhaler 12  . atorvastatin (LIPITOR) 40 MG tablet Take 40 mg by mouth every evening.     . cetirizine (ZYRTEC) 10 MG tablet Take 1 tablet (10 mg total) by mouth 2 (two) times daily. 60 tablet 2  . docusate sodium (COLACE) 100 MG capsule Take 1 capsule (100 mg total) by mouth 2 (two) times daily. 60 capsule 0  . gabapentin (NEURONTIN) 400 MG capsule Take 3 capsules (1,200 mg total) by mouth 3 (three) times daily as needed (neuropathic pain). Max dose 3600 mg /24 hours 270 capsule 5  . Glucose Blood (BLOOD GLUCOSE TEST STRIPS) STRP 1 each by Other route 3 (three) times a day. Use as instructed    . insulin aspart (NOVOLOG FLEXPEN) 100 UNIT/ML FlexPen Inject 28 Units into the skin 3 (three) times daily with meals.    . Insulin Degludec (TRESIBA FLEXTOUCH) 200 UNIT/ML SOPN Inject 40 Units into the skin 2 (two) times daily.    . Insulin Pen Needle 32G X 4 MM MISC by Does not apply route.    Marland Kitchen ipratropium (ATROVENT) 0.03 % nasal spray Place 2 sprays into both nostrils 3 (three) times daily as needed for rhinitis.  30 mL 1  . ipratropium-albuterol (DUONEB) 0.5-2.5 (3) MG/3ML SOLN Take 3 mLs by nebulization every 2 (two) hours as needed (wheeze, SOB). 60 mL 3  . nystatin (MYCOSTATIN/NYSTOP) powder Apply 1 g topically 2 (two) times daily as needed.    . NYSTATIN powder Apply topically. 15 g 1  . omeprazole (PRILOSEC) 20 MG capsule Take 1 capsule (20 mg total) by mouth daily. 90 capsule 1  . valACYclovir (VALTREX) 500  MG tablet Take 500 mg by mouth at bedtime.      No current facility-administered medications on file prior to visit.    Allergies  Allergen Reactions  . Dulaglutide Other (See Comments)  . Sulfur Nausea And Vomiting  . Metformin And Related Diarrhea      Review of Systems:  Constitutional: No recent illness  HEENT: No  headache, no vision change, sinus symptoms as per history of present illness   Cardiac: No  chest pain, No  pressure, No palpitations  Respiratory:  No  shortness of breath. + Chronic Cough  Gastrointestinal: No  abdominal pain,  has been having some constipation lately   Musculoskeletal: No new myalgia/arthralgia  Skin: No  Rash  Neurologic: No  weakness, No  Dizziness   Exam:  BP (!) 146/85   Pulse 91   Ht 5\' 5"  (1.651 m)   Wt 261 lb (118.4 kg)   BMI 43.43 kg/m   Constitutional: VS see above. General Appearance: alert, well-developed, well-nourished, NAD  Eyes: Normal lids and conjunctive, non-icteric sclera  Ears, Nose, Mouth, Throat: MMM, Normal external inspection ears/nares/mouth/lips/gums.Normal nasal mucosa, tympanic membranes normal bilaterally   Neck: No masses, trachea midline. No lymphadenopathy   Respiratory: Normal respiratory effort. + Slight bibasilar wheeze, no rhonchi, no rales  Cardiovascular: S1/S2 normal, no murmur, no rub/gallop auscultated. RRR.   Musculoskeletal: Gait normal. Symmetric and independent movement of all extremities  Neurological: Normal balance/coordination. No tremor.  Skin: warm, dry, intact.   Psychiatric: Normal judgment/insight. Normal mood and affect. Oriented x3.     ASSESSMENT/PLAN:   Vaginosis - Advised that until sugars are under control, this will be a persistent problem for her. - Plan: fluconazole (DIFLUCAN) 150 MG tablet  Other subacute sinusitis - Plan: amoxicillin-clavulanate (AUGMENTIN) 875-125 MG tablet, fluticasone (FLONASE) 50 MCG/ACT nasal spray  History of colon polyps - Plan:  Ambulatory referral to Gastroenterology  Type 2 diabetes mellitus with other skin complication, with long-term current use of insulin ([place]) - We'll refer to endocrinologist within the [facility] system, patient advised if unable to get seen there quickly as an within next 2 weeks return here for A1c  - Plan: Ambulatory referral to Endocrinology  Moderate persistent asthma, unspecified whether complicated - Previously following with pulmonology, may need to consider revisiting this - Plan: beclomethasone (QVAR) 80 MCG/ACT inhaler    Patient Instructions  Plan: Fill prescriptions highlighted for yeast, nasal issues, breathing Fill printed antibiotic if no better in 3 days on daily zyrtec plus flonase  Referrals placed for endocrine and gastrointestinal If unable to get set up with new endocrinologist in the next 2 weeks, come see me to check on sugars (I imagine you'll need to come see me)     Follow-up plan: Return in about 2 weeks (around 08/31/2016) for recheck symptoms and sugars if not able to get into see endocrinology - sooner if needed.  Visit summary with medication list and pertinent instructions was printed for patient to review, alert [place] if any changes needed. All questions at time  of visit were answered - patient instructed to contact office with any additional concerns. ER/RTC precautions were reviewed with the patient and understanding verbalized.

## 2016-08-17 NOTE — Patient Instructions (Signed)
Plan: Fill prescriptions highlighted for yeast, nasal issues, breathing Fill printed antibiotic if no better in 3 days on daily zyrtec plus flonase  Referrals placed for endocrine and gastrointestinal If unable to get set up with new endocrinologist in the next 2 weeks, come see me to check on sugars (I imagine you'll need to come see me)

## 2016-08-26 ENCOUNTER — Encounter: Payer: Self-pay | Admitting: Osteopathic Medicine

## 2016-08-26 MED ORDER — VALACYCLOVIR HCL 500 MG PO TABS: 500.0000 mg | ORAL_TABLET | Freq: Every day | ORAL | 1 refills | Status: DC

## 2016-08-26 MED ORDER — INSULIN DEGLUDEC 200 UNIT/ML ~~LOC~~ SOPN: 40.0000 [IU] | PEN_INJECTOR | Freq: Two times a day (BID) | SUBCUTANEOUS | 1 refills | Status: DC

## 2016-08-31 ENCOUNTER — Encounter: Payer: Self-pay | Admitting: Osteopathic Medicine

## 2016-08-31 ENCOUNTER — Ambulatory Visit (INDEPENDENT_AMBULATORY_CARE_PROVIDER_SITE_OTHER): Payer: Medicaid Other | Admitting: Osteopathic Medicine

## 2016-08-31 VITALS — BP 125/71 | HR 94 | Wt 265.0 lb

## 2016-08-31 DIAGNOSIS — E1165 Type 2 diabetes mellitus with hyperglycemia: Secondary | ICD-10-CM | POA: Diagnosis not present

## 2016-08-31 DIAGNOSIS — B379 Candidiasis, unspecified: Secondary | ICD-10-CM

## 2016-08-31 DIAGNOSIS — Z794 Long term (current) use of insulin: Secondary | ICD-10-CM

## 2016-08-31 DIAGNOSIS — I10 Essential (primary) hypertension: Secondary | ICD-10-CM | POA: Diagnosis not present

## 2016-08-31 LAB — POCT GLYCOSYLATED HEMOGLOBIN (HGB A1C): Hemoglobin A1C: 9.2

## 2016-08-31 MED ORDER — FLUCONAZOLE 150 MG PO TABS: 150.0000 mg | ORAL_TABLET | Freq: Once | ORAL | 1 refills | Status: AC

## 2016-08-31 NOTE — Progress Notes (Signed)
HPI: Tabitha Estrada is a 46 y.o. female  who presents to [place] today, 08/31/16,  for chief complaint of:  Chief Complaint  Patient presents with  . Diabetes    A1C's were creeping up so we wanted to recheck in case worse, trending down a bit as below. Pt has f/u in place with endocrinology. Still having persistent yeast infection symptoms and would like Diflucan refill.   DIABETES SCREENING/PREVENTIVE CARE:  A1C past 3-6 mos: Yes  controlled? No   Medications as of 08/31/16   Novolog 28 Units tid ac  Tresiba 40 units bid   A1C  05/13/16: 11.0%  08/31/16 9.2%   BP goal <140/90: Yes   LDL goal <70: Yes   Eye exam annually: no records available, importance discussed with patient  Foot exam: previously on endocrine  Microalbuminuria:checked 04/2016  Metformin: No   ACE/ARB: No   Antiplatelet if ASCVD Risk >10%: No   Statin: Yes   Pneumovax: None on file   Yeast infection : persistent, requests Diflucan refill  HTN: controlled on current medications, no CP/SOB      Past medical history, surgical history, social history and family history reviewed.  Patient Active Problem List   Diagnosis Date Noted  . 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 Leukemoid reaction 05/17/2016  . Vitamin D deficiency 03/21/2016  . Cough 12/24/2015  . Type 2 diabetes mellitus with hyperglycemia ([place]) 12/24/2015  . Pruritus 12/24/2015  . Reactive airway disease without complication 32/67/1245  . Ethmoid sinusitis 12/24/2015  . Yeast infection 12/24/2015  . Urinary frequency 12/24/2015  . 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 Lumbar disc prolapse with root compression 03/19/2014  . 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 Vitamin B12 deficiency 02/01/2014  . Left ureteral stone 12/08/2013  . Lumbar disc herniation 10/07/2013  . Asthma 05/08/2013  . 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 IBS (irritable bowel syndrome) 03/28/2011  . History of tobacco use 02/04/2011  . Obstructive sleep apnea syndrome in adult 02/04/2011  . Hypertension 12/23/2010  . OSA on CPAP 04/23/2009    Current medication list and allergy/intolerance information reviewed.   Current Outpatient Prescriptions on File Prior to Visit  Medication Sig Dispense Refill  . albuterol (PROVENTIL HFA;VENTOLIN HFA) 108 (90 Base) MCG/ACT inhaler Inhale 1-2 puffs into the lungs every 4 (four) hours as needed for wheezing or shortness of breath. 1 Inhaler 12  . atorvastatin (LIPITOR) 40 MG tablet Take 40 mg by mouth every evening.     . beclomethasone (QVAR) 80 MCG/ACT inhaler Inhale 2 puffs into the lungs daily. 1 Inhaler 12  . cetirizine (ZYRTEC) 10 MG tablet Take 1 tablet (10 mg total) by mouth 2 (two) times daily. 60 tablet 2  . docusate sodium (COLACE) 100 MG capsule Take 1 capsule (100 mg total) by mouth 2 (two) times daily. 60 capsule 0  . fluticasone (FLONASE) 50 MCG/ACT nasal spray Place 2 sprays into both nostrils daily. 48 g 3  . gabapentin (NEURONTIN) 400 MG capsule Take 3 capsules (1,200 mg total) by mouth 3 (three) times daily as needed (neuropathic pain). Max dose 3600 mg /24 hours 270 capsule 5  . Glucose Blood (BLOOD GLUCOSE TEST STRIPS) STRP 1 each by Other route 3 (three) times a day. Use as instructed    . insulin aspart (NOVOLOG FLEXPEN) 100 UNIT/ML FlexPen Inject 28 Units into the skin 3 (three) times daily with meals.    Marland Kitchen  Insulin Degludec (TRESIBA FLEXTOUCH) 200 UNIT/ML SOPN Inject 40 Units into the skin 2 (two) times daily. 5 pen 1  . Insulin Pen Needle 32G X 4 MM MISC by Does not apply route.    Marland Kitchen ipratropium (ATROVENT) 0.03 % nasal spray Place 2 sprays into both nostrils 3 (three) times daily as needed for rhinitis. 30 mL 1  . ipratropium-albuterol (DUONEB) 0.5-2.5 (3) MG/3ML SOLN Take 3 mLs by nebulization every 2 (two) hours as needed (wheeze, SOB). 60 mL 3  . nystatin  (MYCOSTATIN/NYSTOP) powder Apply 1 g topically 2 (two) times daily as needed.    . NYSTATIN powder Apply topically. 15 g 1  . omeprazole (PRILOSEC) 20 MG capsule Take 1 capsule (20 mg total) by mouth daily. 90 capsule 1  . valACYclovir (VALTREX) 500 MG tablet Take 1 tablet (500 mg total) by mouth at bedtime. 30 tablet 1   No current facility-administered medications on file prior to visit.    Allergies  Allergen Reactions  . Dulaglutide Other (See Comments)  . Sulfur Nausea And Vomiting  . Metformin And Related Diarrhea      Review of Systems:  Constitutional: +recent illness  HEENT: No  headache, no vision change, bit hoarse form recent illness   Cardiac: No  chest pain, No  pressure, No palpitations  Respiratory:  No  shortness of breath. No  Cough  Gastrointestinal: No  abdominal pain  Musculoskeletal: No new myalgia/arthralgia  Neurologic: No  weakness, No  Dizziness   Exam:  BP 125/71   Pulse 94   Wt 265 lb (120.2 kg)   SpO2 97%   BMI 44.10 kg/m   Constitutional: VS see above. General Appearance: alert, well-developed, well-nourished, NAD  Eyes: Normal lids and conjunctive, non-icteric sclera  Ears, Nose, Mouth, Throat: MMM, Normal external inspection ears/nares/mouth/lips/gums.  Neck: No masses, trachea midline.   Respiratory: Normal respiratory effort. no wheeze, no rhonchi, no rales  Cardiovascular: S1/S2 normal, no murmur, no rub/gallop auscultated. RRR.   Musculoskeletal: Gait normal. Symmetric and independent movement of all extremities  Neurological: Normal balance/coordination. No tremor.  Psychiatric: Normal judgment/insight. Normal mood and affect. Oriented x3.      Results for orders placed or performed in visit on 08/31/16 (from the past 48 hour(s))  POCT HgB A1C     Status: Abnormal   Collection Time: 08/31/16  2:43 PM  Result Value Ref Range   Hemoglobin A1C 9.2     Comment: %      ASSESSMENT/PLAN:   Type 2 diabetes mellitus  with hyperglycemia, with long-term current use of insulin ([place]) - Plan: POCT HgB A1C  Essential hypertension  Yeast infection     Follow-up plan: Return in about 4 months (around 01/01/2017) for ANNUAL PHYSICAL AND PAP, sooner if needed.  Visit summary with medication list and pertinent instructions was printed for patient to review, alert [place] if any changes needed. All questions at time of visit were answered - patient instructed to contact office with any additional concerns. ER/RTC precautions were reviewed with the patient and understanding verbalized.   Note: Total time spent 15 minutes, greater than 50% of the visit was spent face-to-face counseling and coordinating care for the following: The encounter diagnosis was Type 2 diabetes mellitus with hyperglycemia, with long-term current use of insulin ([place]).Marland Kitchen

## 2016-09-07 ENCOUNTER — Other Ambulatory Visit: Payer: Self-pay | Admitting: Neurosurgery

## 2016-09-07 DIAGNOSIS — M5441 Lumbago with sciatica, right side: Secondary | ICD-10-CM

## 2016-09-08 ENCOUNTER — Telehealth: Payer: Self-pay | Admitting: Gastroenterology

## 2016-09-11 ENCOUNTER — Encounter: Payer: Self-pay | Admitting: Osteopathic Medicine

## 2016-09-12 ENCOUNTER — Encounter: Payer: Self-pay | Admitting: Osteopathic Medicine

## 2016-09-13 ENCOUNTER — Encounter: Payer: Self-pay | Admitting: Gastroenterology

## 2016-09-13 LAB — HM DIABETES EYE EXAM

## 2016-09-13 NOTE — Telephone Encounter (Signed)
Dr. Nandigam reviewed records and has accepted patient. Ok to schedule [facility]. Left message for patient to return my call.  °

## 2016-09-14 ENCOUNTER — Other Ambulatory Visit: Payer: Self-pay | Admitting: *Deleted

## 2016-09-14 ENCOUNTER — Encounter: Payer: Self-pay | Admitting: Osteopathic Medicine

## 2016-09-14 DIAGNOSIS — R238 Other skin changes: Secondary | ICD-10-CM

## 2016-09-14 LAB — CBC
HCT: 40.1 % (ref 35.0–45.0)
HEMOGLOBIN: 12.4 g/dL (ref 11.7–15.5)
MCH: 25 pg — AB (ref 27.0–33.0)
MCHC: 30.9 g/dL — ABNORMAL LOW (ref 32.0–36.0)
MCV: 80.8 fL (ref 80.0–100.0)
MPV: 8.7 fL (ref 7.5–12.5)
PLATELETS: 447 10*3/uL — AB (ref 140–400)
RBC: 4.96 MIL/uL (ref 3.80–5.10)
RDW: 18.5 % — ABNORMAL HIGH (ref 11.0–15.0)
WBC: 18.3 10*3/uL — ABNORMAL HIGH (ref 3.8–10.8)

## 2016-09-14 LAB — COMPREHENSIVE METABOLIC PANEL
ALBUMIN: 3.6 g/dL (ref 3.6–5.1)
ALK PHOS: 119 U/L — AB (ref 33–115)
ALT: 11 U/L (ref 6–29)
AST: 12 U/L (ref 10–30)
BUN: 14 mg/dL (ref 7–25)
CHLORIDE: 105 mmol/L (ref 98–110)
CO2: 26 mmol/L (ref 20–31)
CREATININE: 0.78 mg/dL (ref 0.50–1.10)
Calcium: 8.8 mg/dL (ref 8.6–10.2)
Glucose, Bld: 148 mg/dL — ABNORMAL HIGH (ref 65–99)
Potassium: 4.2 mmol/L (ref 3.5–5.3)
SODIUM: 139 mmol/L (ref 135–146)
TOTAL PROTEIN: 6.2 g/dL (ref 6.1–8.1)
Total Bilirubin: 0.3 mg/dL (ref 0.2–1.2)

## 2016-09-14 LAB — LIPASE: LIPASE: 6 U/L — AB (ref 7–60)

## 2016-09-14 LAB — AMYLASE: AMYLASE: 25 U/L (ref 21–101)

## 2016-09-14 MED ORDER — INSULIN DEGLUDEC 200 UNIT/ML ~~LOC~~ SOPN: 40.0000 [IU] | PEN_INJECTOR | Freq: Two times a day (BID) | SUBCUTANEOUS | 2 refills | Status: DC

## 2016-09-15 ENCOUNTER — Encounter: Payer: Self-pay | Admitting: Osteopathic Medicine

## 2016-09-16 ENCOUNTER — Other Ambulatory Visit (HOSPITAL_BASED_OUTPATIENT_CLINIC_OR_DEPARTMENT_OTHER): Payer: Medicaid Other

## 2016-09-16 ENCOUNTER — Ambulatory Visit (HOSPITAL_BASED_OUTPATIENT_CLINIC_OR_DEPARTMENT_OTHER): Payer: Medicaid Other | Admitting: Family

## 2016-09-16 VITALS — BP 142/74 | HR 77 | Temp 98.0°F | Resp 18 | Wt 265.1 lb

## 2016-09-16 DIAGNOSIS — D509 Iron deficiency anemia, unspecified: Secondary | ICD-10-CM | POA: Diagnosis not present

## 2016-09-16 DIAGNOSIS — D72829 Elevated white blood cell count, unspecified: Secondary | ICD-10-CM

## 2016-09-16 DIAGNOSIS — D72823 Leukemoid reaction: Secondary | ICD-10-CM

## 2016-09-16 DIAGNOSIS — D508 Other iron deficiency anemias: Secondary | ICD-10-CM

## 2016-09-16 DIAGNOSIS — D510 Vitamin B12 deficiency anemia due to intrinsic factor deficiency: Secondary | ICD-10-CM

## 2016-09-16 LAB — COMPREHENSIVE METABOLIC PANEL
ALT: 12 U/L (ref 0–55)
ANION GAP: 10 meq/L (ref 3–11)
AST: 8 U/L (ref 5–34)
Albumin: 3.2 g/dL — ABNORMAL LOW (ref 3.5–5.0)
Alkaline Phosphatase: 136 U/L (ref 40–150)
BILIRUBIN TOTAL: 0.32 mg/dL (ref 0.20–1.20)
BUN: 11.9 mg/dL (ref 7.0–26.0)
CALCIUM: 9 mg/dL (ref 8.4–10.4)
CHLORIDE: 102 meq/L (ref 98–109)
CO2: 26 mEq/L (ref 22–29)
CREATININE: 0.8 mg/dL (ref 0.6–1.1)
EGFR: 85 mL/min/{1.73_m2} — AB (ref >90)
Glucose: 197 mg/dl — ABNORMAL HIGH (ref 70–140)
Potassium: 4.2 mEq/L (ref 3.5–5.1)
Sodium: 138 mEq/L (ref 136–145)
TOTAL PROTEIN: 6.5 g/dL (ref 6.4–8.3)

## 2016-09-16 LAB — MANUAL DIFFERENTIAL (CHCC SATELLITE)
ALC: 2.7 10*3/uL — AB (ref 0.6–2.2)
ANC (CHCC HP manual diff): 16 10*3/uL — ABNORMAL HIGH (ref 1.5–6.7)
BAND NEUTROPHILS: 4 % (ref 0–10)
Eos: 1 % (ref 0–7)
LYMPH: 14 % (ref 14–48)
MONO: 2 % (ref 0–13)
Metamyelocytes: 1 % — ABNORMAL HIGH (ref 0–0)
Myelocytes: 1 % — ABNORMAL HIGH (ref 0–0)
PLT EST ~~LOC~~: ADEQUATE
RBC Comments: NORMAL
SEG: 77 % — AB (ref 40–75)

## 2016-09-16 LAB — FERRITIN: FERRITIN: 29 ng/mL (ref 9–269)

## 2016-09-16 LAB — CBC WITH DIFFERENTIAL (CANCER CENTER ONLY)
HEMATOCRIT: 39.5 % (ref 34.8–46.6)
HGB: 12.1 g/dL (ref 11.6–15.9)
MCH: 25.4 pg — ABNORMAL LOW (ref 26.0–34.0)
MCHC: 30.6 g/dL — ABNORMAL LOW (ref 32.0–36.0)
MCV: 83 fL (ref 81–101)
PLATELETS: 403 10*3/uL — AB (ref 145–400)
RBC: 4.77 10*6/uL (ref 3.70–5.32)
RDW: 17.9 % — AB (ref 11.1–15.7)
WBC: 19.2 10*3/uL — AB (ref 3.9–10.0)

## 2016-09-16 LAB — IRON AND TIBC
%SAT: 12 % — ABNORMAL LOW (ref 21–57)
IRON: 38 ug/dL — AB (ref 41–142)
TIBC: 311 ug/dL (ref 236–444)
UIBC: 273 ug/dL (ref 120–384)

## 2016-09-16 NOTE — Progress Notes (Signed)
Hematology and Oncology Follow Up Visit  Tabitha Estrada [id number] 07-May-1970 46 y.o. 09/16/2016   Principle Diagnosis:  Reactive leukocytosis   Current Therapy:   B 12 5,000 mcg PO daily - will start today 09/16/2016   Interim History:  Tabitha Estrada is here today with her mother for follow-up. She had her third back surgery in March but unfortunately she feels that her back pain is worse. She is unable to walk great distances. She will be having anther MRI with her neurologist, Dr. Christella Noa, Monday next week.  She has not yet started taking the B 12 5,000 mcg PO daily. She will start today.  She is feeling fatigued and has occasional SOB with over exertion.  She has had some issues with allergies and plans to see an allergist soon and discuss injections.  She is still smoking < 1 ppd.  No fever, chills, n/v, cough, rash, dizziness, chest pain, palpitations, abdominal pain or changes in bowel or bladder habits.  She states that she has been having a great deal of constipation and will be scheduling a colonoscopy.  She has no swelling in her extremities. She has some numbness and tingling in the hands and right foot which is unchanged.  She has maintained a good appetite and is staying well hydrated. Her weight is stable.  Her blood sugars are still not well controlled.   ECOG Performance Status: 1 - Symptomatic but completely ambulatory  Medications:  Allergies as of 09/16/2016      Reactions   Dulaglutide Other (See Comments)   Sulfur Nausea And Vomiting   Metformin And Related Diarrhea      Medication List       Accurate as of 09/16/16 11:17 AM. Always use your most recent med list.          albuterol 108 (90 Base) MCG/ACT inhaler Commonly known as:  PROVENTIL HFA;VENTOLIN HFA Inhale 1-2 puffs into the lungs every 4 (four) hours as needed for wheezing or shortness of breath.   atorvastatin 40 MG tablet Commonly known as:  LIPITOR Take 40 mg by mouth every evening.     beclomethasone 80 MCG/ACT inhaler Commonly known as:  QVAR Inhale 2 puffs into the lungs daily.   BLOOD GLUCOSE TEST STRIPS Strp 1 each by Other route 3 (three) times a day. Use as instructed   cetirizine 10 MG tablet Commonly known as:  ZYRTEC Take 1 tablet (10 mg total) by mouth 2 (two) times daily.   docusate sodium 100 MG capsule Commonly known as:  COLACE Take 1 capsule (100 mg total) by mouth 2 (two) times daily.   fluticasone 50 MCG/ACT nasal spray Commonly known as:  FLONASE Place 2 sprays into both nostrils daily.   gabapentin 400 MG capsule Commonly known as:  NEURONTIN Take 3 capsules (1,200 mg total) by mouth 3 (three) times daily as needed (neuropathic pain). Max dose 3600 mg /24 hours   Insulin Degludec 200 UNIT/ML Sopn Commonly known as:  TRESIBA FLEXTOUCH Inject 40 Units into the skin 2 (two) times daily.   Insulin Pen Needle 32G X 4 MM Misc by Does not apply route.   ipratropium 0.03 % nasal spray Commonly known as:  ATROVENT Place 2 sprays into both nostrils 3 (three) times daily as needed for rhinitis.   ipratropium-albuterol 0.5-2.5 (3) MG/3ML Soln Commonly known as:  DUONEB Take 3 mLs by nebulization every 2 (two) hours as needed (wheeze, SOB).   NOVOLOG FLEXPEN 100 UNIT/ML FlexPen Generic drug:  insulin aspart Inject 28 Units into the skin 3 (three) times daily with meals.   nystatin powder Commonly known as:  MYCOSTATIN/NYSTOP Apply 1 g topically 2 (two) times daily as needed.   nystatin powder Generic drug:  nystatin Apply topically.   omeprazole 20 MG capsule Commonly known as:  PRILOSEC Take 1 capsule (20 mg total) by mouth daily.   valACYclovir 500 MG tablet Commonly known as:  VALTREX Take 1 tablet (500 mg total) by mouth at bedtime.       Allergies:  Allergies  Allergen Reactions  . Dulaglutide Other (See Comments)  . Sulfur Nausea And Vomiting  . Metformin And Related Diarrhea    Past Medical History, Surgical  history, Social history, and Family History were reviewed and updated.  Review of Systems: All other 10 point review of systems is negative.   Physical Exam:  vitals were not taken for this visit.  Wt Readings from Last 3 Encounters:  08/31/16 265 lb (120.2 kg)  08/17/16 261 lb (118.4 kg)  06/11/16 264 lb (119.7 kg)    Ocular: Sclerae unicteric, pupils equal, round and reactive to light Ear-nose-throat: Oropharynx clear, dentition fair Lymphatic: No cervical, supraclavicular or axillary adenopathy Lungs no rales or rhonchi, good excursion bilaterally Heart regular rate and rhythm, no murmur appreciated Abd soft, nontender, positive bowel sounds, no liver or spleen tip palpated on exam, no fluid wave  MSK no focal spinal tenderness, no joint edema Neuro: non-focal, well-oriented, appropriate affect Breasts: Deferred   Lab Results  Component Value Date   WBC 18.3 (H) 09/13/2016   HGB 12.4 09/13/2016   HCT 40.1 09/13/2016   MCV 80.8 09/13/2016   PLT 447 (H) 09/13/2016   No results found for: FERRITIN, IRON, TIBC, UIBC, IRONPCTSAT Lab Results  Component Value Date   RBC 4.96 09/13/2016   No results found for: KPAFRELGTCHN, LAMBDASER, KAPLAMBRATIO No results found for: IGGSERUM, IGA, IGMSERUM No results found for: Tabitha Estrada, SPEI   Chemistry      Component Value Date/Time   NA 139 09/13/2016 1545   NA 131 (L) 05/17/2016 1350   K 4.2 09/13/2016 1545   K 4.2 05/17/2016 1350   CL 105 09/13/2016 1545   CL 97 05/17/2016 1350   CO2 26 09/13/2016 1545   CO2 27 05/17/2016 1350   BUN 14 09/13/2016 1545   BUN 9 05/17/2016 1350   CREATININE 0.78 09/13/2016 1545      Component Value Date/Time   CALCIUM 8.8 09/13/2016 1545   CALCIUM 8.8 05/17/2016 1350   ALKPHOS 119 (H) 09/13/2016 1545   ALKPHOS 116 05/17/2016 1350   AST 12 09/13/2016 1545   AST 10 05/17/2016 1350   ALT 11 09/13/2016 1545   ALT 14 05/17/2016 1350    BILITOT 0.3 09/13/2016 1545   BILITOT 0.2 05/17/2016 1350      Impression and Plan: Tabitha Estrada is a very pleasant 46 yo caucasian female with reactive leukocytosis. She has chronic back issues and is a smoker.  She will start taking B 12 PO daily.  She is symptomatic with fatigue and Iron saturation is down at 12%. With her severe constipation she has been unable to tolerate an oral iron supplement.  We will have her come back in next week for an IV iron infusion.  We will plan to see her back again in another 4 months for labs and follow-up.  She will contact our office with any questions or concerns. We  can certainly see her sooner if need be.   Eliezer Bottom, NP 7/19/201811:17 AM

## 2016-09-17 LAB — RETICULOCYTES: Reticulocyte Count: 1.3 % (ref 0.6–2.6)

## 2016-09-17 LAB — VITAMIN B12: Vitamin B12: 599 pg/mL (ref 232–1245)

## 2016-09-20 ENCOUNTER — Ambulatory Visit (INDEPENDENT_AMBULATORY_CARE_PROVIDER_SITE_OTHER): Payer: Medicaid Other

## 2016-09-20 DIAGNOSIS — M5441 Lumbago with sciatica, right side: Secondary | ICD-10-CM

## 2016-09-20 DIAGNOSIS — M4327 Fusion of spine, lumbosacral region: Secondary | ICD-10-CM

## 2016-09-20 MED ORDER — GADOBENATE DIMEGLUMINE 529 MG/ML IV SOLN
20.0000 mL | Freq: Once | INTRAVENOUS | Status: AC | PRN
  Administered 2016-09-20: 20 mL via INTRAVENOUS

## 2016-09-30 ENCOUNTER — Telehealth: Payer: Self-pay | Admitting: *Deleted

## 2016-09-30 ENCOUNTER — Ambulatory Visit: Payer: 59

## 2016-09-30 NOTE — Telephone Encounter (Signed)
Received call from patient stating that she is not feeling well today headache, neck ache, all over achy sick feeling.  Seeing her primary care physician today.  Rescheduled her iron infusion appt till next week.

## 2016-10-01 ENCOUNTER — Encounter: Payer: Self-pay | Admitting: Sports Medicine

## 2016-10-01 ENCOUNTER — Ambulatory Visit (INDEPENDENT_AMBULATORY_CARE_PROVIDER_SITE_OTHER): Payer: Medicaid Other

## 2016-10-01 ENCOUNTER — Ambulatory Visit (INDEPENDENT_AMBULATORY_CARE_PROVIDER_SITE_OTHER): Payer: Medicaid Other | Admitting: Sports Medicine

## 2016-10-01 DIAGNOSIS — F17200 Nicotine dependence, unspecified, uncomplicated: Secondary | ICD-10-CM

## 2016-10-01 DIAGNOSIS — J45901 Unspecified asthma with (acute) exacerbation: Secondary | ICD-10-CM

## 2016-10-01 DIAGNOSIS — G43901 Migraine, unspecified, not intractable, with status migrainosus: Secondary | ICD-10-CM

## 2016-10-01 DIAGNOSIS — G4459 Other complicated headache syndrome: Secondary | ICD-10-CM

## 2016-10-01 DIAGNOSIS — R0989 Other specified symptoms and signs involving the circulatory and respiratory systems: Secondary | ICD-10-CM

## 2016-10-01 DIAGNOSIS — J449 Chronic obstructive pulmonary disease, unspecified: Secondary | ICD-10-CM

## 2016-10-01 MED ORDER — UMECLIDINIUM-VILANTEROL 62.5-25 MCG/INH IN AEPB: 1.0000 | INHALATION_SPRAY | Freq: Every day | RESPIRATORY_TRACT | 11 refills | Status: DC

## 2016-10-01 MED ORDER — PREDNISONE 50 MG PO TABS: ORAL_TABLET | ORAL | 0 refills | Status: DC

## 2016-10-01 MED ORDER — KETOROLAC TROMETHAMINE 30 MG/ML IJ SOLN
30.0000 mg | Freq: Once | INTRAMUSCULAR | Status: AC
  Administered 2016-10-01: 30 mg via INTRAMUSCULAR

## 2016-10-01 MED ORDER — DOXYCYCLINE HYCLATE 100 MG PO TABS: 100.0000 mg | ORAL_TABLET | Freq: Two times a day (BID) | ORAL | 0 refills | Status: AC

## 2016-10-01 MED ORDER — PROMETHAZINE HCL 25 MG PO TABS: 25.0000 mg | ORAL_TABLET | Freq: Four times a day (QID) | ORAL | 3 refills | Status: DC | PRN

## 2016-10-01 MED ORDER — GLYCOPYRROLATE-FORMOTEROL 9-4.8 MCG/ACT IN AERO: 1.0000 | INHALATION_SPRAY | Freq: Every day | RESPIRATORY_TRACT | 11 refills | Status: DC

## 2016-10-01 MED ORDER — PROMETHAZINE HCL 25 MG/ML IJ SOLN
25.0000 mg | Freq: Four times a day (QID) | INTRAMUSCULAR | Status: DC | PRN
  Administered 2016-10-01: 25 mg via INTRAMUSCULAR

## 2016-10-01 MED ORDER — DEXAMETHASONE SODIUM PHOSPHATE 4 MG/ML IJ SOLN
4.0000 mg | Freq: Once | INTRAMUSCULAR | Status: AC
  Administered 2016-10-01: 4 mg via INTRAMUSCULAR

## 2016-10-01 NOTE — Assessment & Plan Note (Addendum)
Chronic smoker, I do think this is more COPD rather than asthma. PFT results showed some degree of restriction, however I think this would be more from her obesity, and that clinically this is classic COPD. I'm going to switch her from her current Qvar to [place]. I'm also going to treat her current exacerbation with doxycycline, prednisone, chest x-ray. My suspicion is that she is going to need to come back for repeat pre-and postbronchodilator spirometry results

## 2016-10-01 NOTE — Assessment & Plan Note (Addendum)
Classic migraine with throbbing headache, phonophobia, photophobia, nausea. We will break this with Phenergan, Toradol, Decadron. Oral Phenergan also called in for nausea at home. Never had brain imaging so we will add a brain MRI with and without contrast. She can return to see either me or her PCP to discuss migraine preventative versus abortive treatment.

## 2016-10-01 NOTE — Progress Notes (Signed)
  Subjective:    CC: Feeling bad  HPI: Cough: Present for about a month now, on and off, did have PFTs suggestive of a restrictive process. She doesn't smoke, cough is productive, wheeze, only using Qvar. Still smokes.  Headache: Bitemporal, throbbing, with nausea, photophobia, phonophobia, present for one week. Has never had advanced imaging.  Past medical history:  Negative.  See flowsheet/record as well for more information.  Surgical history: Negative.  See flowsheet/record as well for more information.  Family history: Negative.  See flowsheet/record as well for more information.  Social history: Negative.  See flowsheet/record as well for more information.  Allergies, and medications have been entered into the medical record, reviewed, and no changes needed.   Review of Systems: No fevers, chills, night sweats, weight loss, chest pain, or shortness of breath.   Objective:    General: Well Developed, well nourished, and in no acute distress.  Neuro: Alert and oriented x3, extra-ocular muscles intact, sensation grossly intact. Cranial nerve II through XII are intact, motor, sensory, coordinative functions are all intact HEENT: Normocephalic, atraumatic, pupils equal round reactive to light, neck supple, no masses, no lymphadenopathy, thyroid nonpalpable.  Skin: Warm and dry, no rashes. Cardiac: Regular rate and rhythm, no murmurs rubs or gallops, no lower extremity edema.  Respiratory: Coarse sounds throughout with inspiratory and expiratory wheezes Not using accessory muscles, speaking in full sentences.  Chest x-ray personally reviewed and is negative  Toradol 30 mg, Decadron 4 mg, Phenergan 25 mg given intramuscular  Impression and Recommendations:    Status migrainosus Classic migraine with throbbing headache, phonophobia, photophobia, nausea. We will break this with Phenergan, Toradol, Decadron. Never had brain imaging so we will add a brain MRI with and without  contrast. She can return to see either me or her PCP to discuss migraine preventative versus abortive treatment.  COPD mixed type ([place]) Chronic smoker, I do think this is more COPD rather than asthma. PFT results showed some degree of restriction, however I think this would be more from her obesity, and that clinically this is classic COPD. I'm going to switch her from her current Qvar to [place]. I'm also going to treat her current exacerbation with doxycycline, prednisone, chest x-ray. My suspicion is that she is going to need to come back for repeat pre-and postbronchodilator spirometry results  I spent 40 minutes with this patient, greater than 50% was face-to-face time counseling regarding the above diagnoses

## 2016-10-05 ENCOUNTER — Telehealth: Payer: Self-pay

## 2016-10-05 NOTE — Telephone Encounter (Signed)
Left message on patient vm to avoid NSAID use and she could take Tylenol, advised patient to call back if she had any questions. Kendria Halberg,CMA

## 2016-10-05 NOTE — Telephone Encounter (Signed)
Spoke to patient advised her of the information as noted below. Patient stated that she has taken Tylenol and it does not work for her but she did complete her last round of prednisone today. Please advise. Ares Cardozo,CMA

## 2016-10-05 NOTE — Telephone Encounter (Signed)
Patient called stated that she is taking prednisone but wantsto know if it is safe to use Advil also because she is still in a lot of pain. Please advise. Declan Mier,CMA

## 2016-10-05 NOTE — Telephone Encounter (Signed)
Try to avoid NSAIDS during prednisone therapy.  May use tylenol.

## 2016-10-07 ENCOUNTER — Ambulatory Visit (HOSPITAL_BASED_OUTPATIENT_CLINIC_OR_DEPARTMENT_OTHER): Payer: 59

## 2016-10-07 VITALS — BP 116/67 | HR 77 | Temp 98.2°F | Resp 18

## 2016-10-07 DIAGNOSIS — D508 Other iron deficiency anemias: Secondary | ICD-10-CM

## 2016-10-07 DIAGNOSIS — D509 Iron deficiency anemia, unspecified: Secondary | ICD-10-CM

## 2016-10-07 MED ORDER — SODIUM CHLORIDE 0.9 % IV SOLN
Freq: Once | INTRAVENOUS | Status: AC
  Administered 2016-10-07: 11:00:00 via INTRAVENOUS

## 2016-10-07 MED ORDER — SODIUM CHLORIDE 0.9 % IV SOLN
510.0000 mg | Freq: Once | INTRAVENOUS | Status: AC
  Administered 2016-10-07: 510 mg via INTRAVENOUS
  Filled 2016-10-07: qty 17

## 2016-10-07 NOTE — Patient Instructions (Signed)

## 2016-10-11 ENCOUNTER — Ambulatory Visit (INDEPENDENT_AMBULATORY_CARE_PROVIDER_SITE_OTHER): Payer: Medicaid Other

## 2016-10-11 DIAGNOSIS — R42 Dizziness and giddiness: Secondary | ICD-10-CM

## 2016-10-11 DIAGNOSIS — G43901 Migraine, unspecified, not intractable, with status migrainosus: Secondary | ICD-10-CM

## 2016-10-11 DIAGNOSIS — G4459 Other complicated headache syndrome: Secondary | ICD-10-CM

## 2016-10-11 MED ORDER — GADOBENATE DIMEGLUMINE 529 MG/ML IV SOLN
20.0000 mL | Freq: Once | INTRAVENOUS | Status: AC | PRN
  Administered 2016-10-11: 20 mL via INTRAVENOUS

## 2016-10-13 ENCOUNTER — Encounter: Payer: Self-pay | Admitting: Endocrinology

## 2016-10-13 ENCOUNTER — Ambulatory Visit (INDEPENDENT_AMBULATORY_CARE_PROVIDER_SITE_OTHER): Payer: Medicaid Other | Admitting: Endocrinology

## 2016-10-13 ENCOUNTER — Telehealth: Payer: Self-pay | Admitting: Endocrinology

## 2016-10-13 VITALS — BP 122/78 | HR 86 | Wt 264.6 lb

## 2016-10-13 DIAGNOSIS — E1165 Type 2 diabetes mellitus with hyperglycemia: Secondary | ICD-10-CM | POA: Diagnosis not present

## 2016-10-13 DIAGNOSIS — Z794 Long term (current) use of insulin: Secondary | ICD-10-CM

## 2016-10-13 LAB — POCT GLYCOSYLATED HEMOGLOBIN (HGB A1C): HEMOGLOBIN A1C: 10.6

## 2016-10-13 MED ORDER — INSULIN GLARGINE 100 UNIT/ML SOLOSTAR PEN: 80.0000 [IU] | PEN_INJECTOR | Freq: Every day | SUBCUTANEOUS | 99 refills | Status: DC

## 2016-10-13 MED ORDER — SITAGLIPTIN PHOSPHATE 100 MG PO TABS: 100.0000 mg | ORAL_TABLET | Freq: Every day | ORAL | 11 refills | Status: DC

## 2016-10-13 NOTE — Telephone Encounter (Signed)
Patient not sure which meds to take.  Wants to know if she should be taking Latus. [name], or both?  Can leave a detailed message.  Please advise.  Ty,  -LL

## 2016-10-13 NOTE — Progress Notes (Signed)
Subjective:    Patient ID: Tabitha Estrada, female    DOB: Oct 05, 1970, 46 y.o.   MRN: [id number]  HPI pt is referred by Dr Sheppard Coil, for diabetes.  Pt states DM was dx'ed in 2007; she has moderate neuropathy of the lower extremities; she is unaware of any associated chronic complications; she has been on insulin since 2010; pt says her diet and exercise are poor; activity is limited by back pain.  she has never had GDM, pancreatitis, pancreatic surgery, severe hypoglycemia, or DKA.  She finished prednisone (for AB) 1 week ago.  cbg's have decreased to the 200's.  Prior to the prednisone, cbg's varied from 190-200's.  It is in general higher as the day goes on.  She takes multiple daily injections, and says she never misses the insulin.  She did not tolerate trulicity (abd pain), metformin (n/v/d), or victoza (vaginitis).   Past Medical History:  Diagnosis Date  . Allergy   . Anemia   . Anxiety   . Arthritis   . Asthma   . COPD (chronic obstructive pulmonary disease) ([place])   . Depression   . Diabetes mellitus without complication ([place])   . Dyspnea    with exertion and bronchititis  . Fibromyalgia   . GERD (gastroesophageal reflux disease)   . History of hiatal hernia   . History of kidney stones   . Hyperlipidemia   . Neuromuscular disorder ([place])   . Pneumonia    hs, 01-19-15  . Sleep apnea    has Cpap have not been using    Past Surgical History:  Procedure Laterality Date  . BACK SURGERY  2015,2016  . CARPAL TUNNEL RELEASE Right 2008  . CHOLECYSTECTOMY  1992  . KIDNEY STONE SURGERY  2015  . TONSILLECTOMY  1979  . TUBAL LIGATION  2005    Social History   Social History  . Marital status: Married    Spouse name: N/A  . Number of children: N/A  . Years of education: N/A   Occupational History  . Not on file.   Social History Main Topics  . Smoking status: Current Every Day Smoker    Packs/day: 0.75    Years: 33.00    Types: Cigarettes  . Smokeless tobacco:  Never Used     Comment: 05/17/2016 still smokes  . Alcohol use No  . Drug use: No  . Sexual activity: Not on file   Other Topics Concern  . Not on file   Social History Narrative  . No narrative on file    Current Outpatient Prescriptions on File Prior to Visit  Medication Sig Dispense Refill  . albuterol (PROVENTIL HFA;VENTOLIN HFA) 108 (90 Base) MCG/ACT inhaler Inhale 1-2 puffs into the lungs every 4 (four) hours as needed for wheezing or shortness of breath. 1 Inhaler 12  . BIOTIN PO Take by mouth daily.    . cetirizine (ZYRTEC) 10 MG tablet Take 1 tablet (10 mg total) by mouth 2 (two) times daily. 60 tablet 2  . cholecalciferol (VITAMIN D) 1000 units tablet Take 3,000 Units by mouth daily.    Marland Kitchen docusate sodium (COLACE) 100 MG capsule Take 1 capsule (100 mg total) by mouth 2 (two) times daily. 60 capsule 0  . fluticasone (FLONASE) 50 MCG/ACT nasal spray Place 2 sprays into both nostrils daily. 48 g 3  . gabapentin (NEURONTIN) 400 MG capsule Take 3 capsules (1,200 mg total) by mouth 3 (three) times daily as needed (neuropathic pain). Max dose 3600 mg /  24 hours 270 capsule 5  . Glucose Blood (BLOOD GLUCOSE TEST STRIPS) STRP 1 each by Other route 3 (three) times a day. Use as instructed    . Glycopyrrolate-Formoterol (BEVESPI AEROSPHERE) 9-4.8 MCG/ACT AERO Inhale 1 puff into the lungs daily. 1 Inhaler 11  . insulin aspart (NOVOLOG FLEXPEN) 100 UNIT/ML FlexPen Inject 28 Units into the skin 3 (three) times daily with meals.    . Insulin Pen Needle 32G X 4 MM MISC by Does not apply route.    Marland Kitchen ipratropium (ATROVENT) 0.03 % nasal spray Place 2 sprays into both nostrils 3 (three) times daily as needed for rhinitis. 30 mL 1  . ipratropium-albuterol (DUONEB) 0.5-2.5 (3) MG/3ML SOLN Take 3 mLs by nebulization every 2 (two) hours as needed (wheeze, SOB). 60 mL 3  . nystatin (MYCOSTATIN/NYSTOP) powder Apply 1 g topically 2 (two) times daily as needed.    Marland Kitchen omeprazole (PRILOSEC) 20 MG capsule Take  1 capsule (20 mg total) by mouth daily. 90 capsule 1  . valACYclovir (VALTREX) 500 MG tablet Take 1 tablet (500 mg total) by mouth at bedtime. 30 tablet 1  . atorvastatin (LIPITOR) 40 MG tablet Take 40 mg by mouth every evening.      No current facility-administered medications on file prior to visit.     Allergies  Allergen Reactions  . Dulaglutide Other (See Comments)  . Sulfur Nausea And Vomiting  . Metformin And Related Diarrhea    Family History  Problem Relation Age of Onset  . Diabetes Father   . Hyperlipidemia Father   . Hypertension Father   . Diabetes Paternal Aunt   . Hypertension Paternal Aunt   . Heart attack Paternal Uncle   . Emphysema Maternal Grandmother   . Heart failure Maternal Grandmother   . Cancer Maternal Grandfather   . Colon cancer Neg Hx    BP 122/78   Pulse 86   Wt 264 lb 9.6 oz (120 kg)   SpO2 94%   BMI 44.03 kg/m   Review of Systems denies chest pain, sob, n/v, muscle cramps, excessive diaphoresis, depression, cold intolerance, rhinorrhea, and easy bruising.  She has weight gain.  She has chronically blurry vision (sees opthal), frequent urination, and headache.     Objective:   Physical Exam VS: see vs page GEN: no distress.  Morbid obesity HEAD: head: no deformity eyes: no periorbital swelling, no proptosis external nose and ears are normal mouth: no lesion seen NECK: supple, thyroid is not enlarged CHEST WALL: no deformity LUNGS: clear to auscultation, except for a few wheezes CV: reg rate and rhythm, no murmur ABD: abdomen is soft, nontender.  no hepatosplenomegaly.  not distended.  no hernia MUSCULOSKELETAL: muscle bulk and strength are grossly normal.  no obvious joint swelling.  gait is normal and steady, except she favors right leg.  EXTEMITIES: no deformity.  no ulcer on the feet.  feet are of normal color and temp.  1+ bilat leg edema PULSES: dorsalis pedis intact bilat.  no carotid bruit NEURO:  cn 2-12 grossly intact.    readily moves all 4's.  sensation is intact to touch on the feet SKIN:  Normal texture and temperature.  No rash or suspicious lesion is visible.   NODES:  None palpable at the neck PSYCH: alert, well-oriented.  Does not appear anxious nor depressed.    Lab Results  Component Value Date   HGBA1C 10.6 10/13/2016   I personally reviewed electrocardiogram tracing (05/13/16): Indication: preop Impression: NSR.  No  MI.  No hypertrophy. Comparison is available  I have reviewed outside records, and summarized: Pt was noted to have severely elevated a1c, and referred here.  Glycemic control was worsening despite increases in rx.  She also had recurrent cutaneous yeast infections.       Assessment & Plan:  Insulin-requiring type 2 DM, with polyneuropathy: severe exacerbation.  Morbid obesity, new to me.  She does not qualify for bariatric surgery, due to medicaid.   Patient Instructions  good diet and exercise significantly improve the control of your diabetes.  please let me know if you wish to be referred to a dietician.  high blood sugar is very risky to your health.  you should see an eye doctor and dentist every year.  It is very important to get all recommended vaccinations.   Controlling your blood pressure and cholesterol drastically reduces the damage diabetes does to your body.  Those who smoke should quit.  Please discuss these with your doctor.   check your blood sugar twice a day.  vary the time of day when you check, between before the 3 meals, and at bedtime.  also check if you have symptoms of your blood sugar being too high or too low.  please keep a record of the readings and bring it to your next appointment here (or you can bring the meter itself).  You can write it on any piece of paper.  please call us sooner if your blood sugar goes below 70, or if you have a lot of readings over 200.   Please change your medications to those listed below. Please call or message [place] next week,  to tell us how the blood sugar is doing.  Please come back for a follow-up appointment in 2 months.

## 2016-10-13 NOTE — Patient Instructions (Addendum)
good diet and exercise significantly improve the control of your diabetes.  please let me know if you wish to be referred to a dietician.  high blood sugar is very risky to your health.  you should see an eye doctor and dentist every year.  It is very important to get all recommended vaccinations.   Controlling your blood pressure and cholesterol drastically reduces the damage diabetes does to your body.  Those who smoke should quit.  Please discuss these with your doctor.   check your blood sugar twice a day.  vary the time of day when you check, between before the 3 meals, and at bedtime.  also check if you have symptoms of your blood sugar being too high or too low.  please keep a record of the readings and bring it to your next appointment here (or you can bring the meter itself).  You can write it on any piece of paper.  please call us sooner if your blood sugar goes below 70, or if you have a lot of readings over 200.   Please change your medications to those listed below. Please call or message [place] next week, to tell us how the blood sugar is doing.  Please come back for a follow-up appointment in 2 months.

## 2016-10-14 ENCOUNTER — Ambulatory Visit (INDEPENDENT_AMBULATORY_CARE_PROVIDER_SITE_OTHER): Payer: Medicaid Other | Admitting: Sports Medicine

## 2016-10-14 ENCOUNTER — Encounter: Payer: Self-pay | Admitting: Sports Medicine

## 2016-10-14 ENCOUNTER — Telehealth: Payer: Self-pay | Admitting: *Deleted

## 2016-10-14 DIAGNOSIS — J449 Chronic obstructive pulmonary disease, unspecified: Secondary | ICD-10-CM

## 2016-10-14 DIAGNOSIS — G43901 Migraine, unspecified, not intractable, with status migrainosus: Secondary | ICD-10-CM | POA: Diagnosis not present

## 2016-10-14 MED ORDER — PREDNISONE 10 MG (48) PO TBPK: ORAL_TABLET | Freq: Every day | ORAL | 0 refills | Status: DC

## 2016-10-14 MED ORDER — TOPIRAMATE 50 MG PO TABS: ORAL_TABLET | ORAL | 3 refills | Status: DC

## 2016-10-14 MED ORDER — AZITHROMYCIN 250 MG PO TABS: ORAL_TABLET | ORAL | 0 refills | Status: DC

## 2016-10-14 MED ORDER — RIZATRIPTAN BENZOATE 10 MG PO TBDP: 10.0000 mg | ORAL_TABLET | ORAL | 3 refills | Status: DC | PRN

## 2016-10-14 MED ORDER — BECLOMETHASONE DIPROPIONATE 80 MCG/ACT IN AERS: 2.0000 | INHALATION_SPRAY | Freq: Two times a day (BID) | RESPIRATORY_TRACT | 12 refills | Status: DC

## 2016-10-14 MED ORDER — CLOBETASOL PROPIONATE 0.05 % EX OINT: 1.0000 "application " | TOPICAL_OINTMENT | Freq: Two times a day (BID) | CUTANEOUS | 0 refills | Status: DC

## 2016-10-14 NOTE — Telephone Encounter (Signed)
The lantus replaces the tresiba, due to preference on medicaid formulary.

## 2016-10-14 NOTE — Telephone Encounter (Signed)
Please call tomorrow, to tell us how cbg's are doing.   Please see Tabitha Estrada, about the [employer].  I have put in the referral.  Can we make appt for her/

## 2016-10-14 NOTE — Assessment & Plan Note (Signed)
Improved with the initial treatment but having a recurrence. Starting Topamax, Maxalt. Return in one month.

## 2016-10-14 NOTE — Telephone Encounter (Signed)
Notified patient to just take the Lantus. She expressed that she would still like to see Vaughan Basta or talk about the freestyle libre? Also her PCP put her back on prednisone today & would like to know she increase insulin?

## 2016-10-14 NOTE — Telephone Encounter (Signed)
PA initiated through [facility] tracks ref # [id number]  Interaction ID [id number]

## 2016-10-14 NOTE — Progress Notes (Signed)
  Subjective:    CC: Multiple issues  HPI: Shortness of breath: Smoker, likely COPD. We did a burst of prednisone and antibiotics at the last visit, she didn't really have much improvement. She was able to start her Bevespi.  Migraines: Brain MRI was negative, the injections at the last visit aborted her migraine, she is agreeable to start with preventative and abortive treatments  Past medical history:  Negative.  See flowsheet/record as well for more information.  Surgical history: Negative.  See flowsheet/record as well for more information.  Family history: Negative.  See flowsheet/record as well for more information.  Social history: Negative.  See flowsheet/record as well for more information.  Allergies, and medications have been entered into the medical record, reviewed, and no changes needed.   Review of Systems: No fevers, chills, night sweats, weight loss, chest pain, or shortness of breath.   Objective:    General: Well Developed, well nourished, and in no acute distress.  Neuro: Alert and oriented x3, extra-ocular muscles intact, sensation grossly intact.  HEENT: Normocephalic, atraumatic, pupils equal round reactive to light, neck supple, no masses, no lymphadenopathy, thyroid nonpalpable.  Skin: Warm and dry, no rashes. Cardiac: Regular rate and rhythm, no murmurs rubs or gallops, no lower extremity edema.  Respiratory: Coarse sounds with expiratory wheezes. Not using accessory muscles, speaking in full sentences.  Impression and Recommendations:    COPD mixed type [facility]) Pulmonary function testing last year did show nonreversible restriction. Doing okay with Bevespi but hasn't noticed any improvement in breathing just yet. Still has significant wheezing and coarse sounds. Adding Qvar, return for pre-and postbronchodilator spirometry. Be sure to use nebulizer and albuterol inhalers as needed.  Significant increase coughing, restarted prednisone this time in a taper  and adding azithromycin.  Status migrainosus Improved with the initial treatment but having a recurrence. Starting Topamax, Maxalt. Return in one month.  I spent 40 minutes with this patient, greater than 50% was face-to-face time counseling regarding the above diagnoses

## 2016-10-14 NOTE — Assessment & Plan Note (Addendum)
Pulmonary function testing last year did show nonreversible restriction. Doing okay with Bevespi but hasn't noticed any improvement in breathing just yet. Still has significant wheezing and coarse sounds. Adding Qvar, return for pre-and postbronchodilator spirometry. Be sure to use nebulizer and albuterol inhalers as needed.  Significant increase coughing, restarted prednisone this time in a taper and adding azithromycin.

## 2016-10-14 NOTE — Telephone Encounter (Signed)
Please advise. Thanks.  

## 2016-10-15 ENCOUNTER — Ambulatory Visit (AMBULATORY_SURGERY_CENTER): Payer: Self-pay

## 2016-10-15 ENCOUNTER — Telehealth: Payer: Self-pay

## 2016-10-15 ENCOUNTER — Ambulatory Visit: Payer: 59 | Admitting: Sports Medicine

## 2016-10-15 VITALS — Ht 65.0 in | Wt 265.0 lb

## 2016-10-15 DIAGNOSIS — D508 Other iron deficiency anemias: Secondary | ICD-10-CM

## 2016-10-15 MED ORDER — SUPREP BOWEL PREP KIT 17.5-3.13-1.6 GM/177ML PO SOLN: 1.0000 | Freq: Once | ORAL | 0 refills | Status: AC

## 2016-10-15 NOTE — Telephone Encounter (Signed)
Called patient and received blood sugar from this morning. Patient states no change yet as it was 245, but patient states she will call middle of next week and maybe get a more accurate reading on the change in insulin.  Tabitha Estrada, Please call patient when you return to make appointment to see you to discuss the [place]. Thank you!

## 2016-10-15 NOTE — Telephone Encounter (Signed)
Called patient and received blood sugar from this morning. Patient states no change yet as it was 245, but patient states she will call middle of next week and maybe get a more accurate reading on the change in insulin.  Tabitha Estrada, Please call patient when you return to make appointment to see you to discuss the [place]. Thank you!

## 2016-10-15 NOTE — Progress Notes (Signed)
No diet meds No home oxygen No allergies to eggs or soy No past problems with anesthesia  Registered emmi 

## 2016-10-18 ENCOUNTER — Telehealth: Payer: Self-pay | Admitting: Endocrinology

## 2016-10-18 NOTE — Telephone Encounter (Signed)
Bs readings:  Friday Evening 401 Saturday Morning 183 Saturday Evening 399 Sunday Morning 366 Sunday Evening 457 Monday Morning 105  Also urinating every 20 mins or so and feels weak.  Please advise.  Ty,  -LL

## 2016-10-18 NOTE — Telephone Encounter (Signed)
See below, patient called back to give blood sugar readings.

## 2016-10-18 NOTE — Telephone Encounter (Signed)
Discussed the sensor with her, and she is Medicaid, and wants to know if they pay for it.  I told her that I think it does not, but I can give her the reader, and a copay card, if she wants to go by the phrarmacy and see how much it will cost her with this card.  She agreed to do it.  Message sento to Judson Roch

## 2016-10-19 ENCOUNTER — Telehealth: Payer: Self-pay

## 2016-10-19 ENCOUNTER — Other Ambulatory Visit: Payer: Self-pay

## 2016-10-19 MED ORDER — FREESTYLE LIBRE SENSOR SYSTEM MISC: 3 refills | Status: DC

## 2016-10-19 NOTE — Telephone Encounter (Signed)
Please advise of blood sugar readings, any changes?

## 2016-10-19 NOTE — Telephone Encounter (Signed)
Called patient, verified correct dose now, and what to increase the insulins too. Patient understood and would call back in 2 days with sugar readings.

## 2016-10-19 NOTE — Telephone Encounter (Signed)
Please verify lantus is 80 units qd, and novolog is 28 units 3 times a day (just before each meal) Then please increase novolog to 35 units 3 times a day (just before each meal), and lantus 100 units qd Please call or message [place] in 2 days, to tell us how the blood sugar is doing

## 2016-10-22 ENCOUNTER — Telehealth: Payer: Self-pay | Admitting: Endocrinology

## 2016-10-22 NOTE — Telephone Encounter (Signed)
Notified patient and said she would call back with blood sugars next week. She also stated that she only had 3 more days on prednisone, so hopefully blood sugars would come down some when finished.

## 2016-10-22 NOTE — Telephone Encounter (Signed)
please increase novolog to 40 units 3 times a day (just before each meal), and lantus to 110 units qd. Please call or message [place] next week, to tell us how the blood sugar is doing

## 2016-10-22 NOTE — Telephone Encounter (Signed)
Patient called in giving blood sugar numbers.   10/19/16 Morning-221 Evening-160  10/20/16 Morning-243 Evening-352  10/21/16 [id number] Evening-204  10/22/16 Morning-201  Please call patient and advise.

## 2016-10-27 ENCOUNTER — Other Ambulatory Visit: Payer: Self-pay | Admitting: Osteopathic Medicine

## 2016-10-27 ENCOUNTER — Telehealth: Payer: Self-pay | Admitting: Endocrinology

## 2016-10-27 NOTE — Telephone Encounter (Signed)
Pharmacy needs a PA on sitaGLIPtin (JANUVIA) 100 MG tablet. The paperwork was faxed on 10/13/16. Please advise as soon as possible so patient can get medication

## 2016-10-28 ENCOUNTER — Telehealth: Payer: Self-pay | Admitting: Gastroenterology

## 2016-10-28 ENCOUNTER — Telehealth: Payer: Self-pay | Admitting: Endocrinology

## 2016-10-28 ENCOUNTER — Ambulatory Visit (INDEPENDENT_AMBULATORY_CARE_PROVIDER_SITE_OTHER): Payer: Medicaid Other | Admitting: Osteopathic Medicine

## 2016-10-28 ENCOUNTER — Other Ambulatory Visit: Payer: Self-pay

## 2016-10-28 VITALS — BP 124/70 | HR 89 | Wt 266.0 lb

## 2016-10-28 DIAGNOSIS — B379 Candidiasis, unspecified: Secondary | ICD-10-CM

## 2016-10-28 DIAGNOSIS — B370 Candidal stomatitis: Secondary | ICD-10-CM

## 2016-10-28 DIAGNOSIS — J449 Chronic obstructive pulmonary disease, unspecified: Secondary | ICD-10-CM | POA: Diagnosis not present

## 2016-10-28 DIAGNOSIS — Z794 Long term (current) use of insulin: Secondary | ICD-10-CM

## 2016-10-28 DIAGNOSIS — E1165 Type 2 diabetes mellitus with hyperglycemia: Secondary | ICD-10-CM | POA: Diagnosis not present

## 2016-10-28 DIAGNOSIS — J984 Other disorders of lung: Secondary | ICD-10-CM | POA: Diagnosis not present

## 2016-10-28 MED ORDER — FLUCONAZOLE 150 MG PO TABS: 150.0000 mg | ORAL_TABLET | Freq: Once | ORAL | 1 refills | Status: AC

## 2016-10-28 MED ORDER — LINAGLIPTIN 5 MG PO TABS: 5.0000 mg | ORAL_TABLET | Freq: Every day | ORAL | 11 refills | Status: DC

## 2016-10-28 MED ORDER — INSULIN ASPART 100 UNIT/ML FLEXPEN: 28.0000 [IU] | PEN_INJECTOR | Freq: Three times a day (TID) | SUBCUTANEOUS | 6 refills | Status: DC

## 2016-10-28 MED ORDER — FIRST-DUKES MOUTHWASH MT SUSP: 5.0000 mL | Freq: Four times a day (QID) | OROMUCOSAL | 1 refills | Status: DC

## 2016-10-28 NOTE — Telephone Encounter (Signed)
Tabitha Estrada is preferred

## 2016-10-28 NOTE — Progress Notes (Signed)
HPI: Tabitha Estrada is a 46 y.o. female  who presents to [facility] today, 10/28/16,  for chief complaint of:  Chief Complaint  Patient presents with  . COPD    Recently seen by colleague for breathing issues/bronchitis. Treated for COPD exacerbation and was advised f/u for spirometry so she is here today for that. Compared to PFT's in office last year, no significant difference - see below, still concern for restrictive disease. She was instructed at last PFT's to follow up with her pulmonologist but never did...  Last visit was started on Qvar, she thinks she may have developed thrush since being on this medicine, hasn't been brushing teeth or rinsing mouth/throat after use  Also concerned about vaginal yeast infection   Past medical history, surgical history, social history and family history reviewed.  Patient Active Problem List   Diagnosis Date Noted  . Status migrainosus 10/01/2016  . IDA (iron deficiency anemia) 09/16/2016  . 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 Leukemoid reaction 05/17/2016  . Vitamin D deficiency 03/21/2016  . Type 2 diabetes mellitus with hyperglycemia ([place]) 12/24/2015  . Pruritus 12/24/2015  . Urinary frequency 12/24/2015  . 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 Vitamin B12 deficiency 02/01/2014  . Left ureteral stone 12/08/2013  . Lumbar disc herniation 10/07/2013  . COPD mixed type ([place]) 05/08/2013  . 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 IBS (irritable bowel syndrome) 03/28/2011  . History of tobacco use 02/04/2011  . Hypertension 12/23/2010  . OSA on CPAP 04/23/2009    Current medication list and allergy/intolerance information reviewed.   Current Outpatient Prescriptions on File Prior to Visit  Medication Sig Dispense Refill  . albuterol (PROVENTIL HFA;VENTOLIN HFA) 108 (90 Base) MCG/ACT inhaler Inhale 1-2  puffs into the lungs every 4 (four) hours as needed for wheezing or shortness of breath. 1 Inhaler 12  . BIOTIN PO Take by mouth daily.    . cetirizine (ZYRTEC) 10 MG tablet Take 1 tablet (10 mg total) by mouth 2 (two) times daily. 60 tablet 2  . cholecalciferol (VITAMIN D) 1000 units tablet Take 3,000 Units by mouth daily.    . clobetasol ointment (TEMOVATE) 3.82 % Apply 1 application topically 2 (two) times daily. 60 g 0  . Continuous Blood Gluc Sensor (FREESTYLE LIBRE SENSOR SYSTEM) MISC 3 sensors for 30 days(1 month supply) change every 10 days. 3 each 3  . Cyanocobalamin (VITAMIN B-12) 5000 MCG TBDP Take 5,000 mcg by mouth daily.     Marland Kitchen docusate sodium (COLACE) 100 MG capsule Take 1 capsule (100 mg total) by mouth 2 (two) times daily. 60 capsule 0  . fluticasone (FLONASE) 50 MCG/ACT nasal spray Place 2 sprays into both nostrils daily. 48 g 3  . gabapentin (NEURONTIN) 400 MG capsule Take 3 capsules (1,200 mg total) by mouth 3 (three) times daily as needed (neuropathic pain). Max dose 3600 mg /24 hours 270 capsule 5  . Glucose Blood (BLOOD GLUCOSE TEST STRIPS) STRP 1 each by Other route 3 (three) times a day. Use as instructed    . Glycopyrrolate-Formoterol (BEVESPI AEROSPHERE) 9-4.8 MCG/ACT AERO Inhale 1 puff into the lungs daily. 1 Inhaler 11  . insulin aspart (NOVOLOG FLEXPEN) 100 UNIT/ML FlexPen Inject 28 Units into the skin 3 (three) times daily with meals.    . Insulin Glargine (LANTUS SOLOSTAR) 100 UNIT/ML Solostar Pen Inject 80 Units into the skin daily. And pen needles  5/day 10 pen PRN  . Insulin Pen Needle 32G X 4 MM MISC by Does not apply route.    Marland Kitchen ipratropium (ATROVENT) 0.03 % nasal spray Place 2 sprays into both nostrils 3 (three) times daily as needed for rhinitis. 30 mL 1  . ipratropium-albuterol (DUONEB) 0.5-2.5 (3) MG/3ML SOLN Take 3 mLs by nebulization every 2 (two) hours as needed (wheeze, SOB). 60 mL 3  . nystatin (MYCOSTATIN/NYSTOP) powder Apply 1 g topically 2 (two) times  daily as needed.    Marland Kitchen omeprazole (PRILOSEC) 20 MG capsule Take 1 capsule (20 mg total) by mouth daily. 90 capsule 1  . predniSONE (STERAPRED UNI-PAK 48 TAB) 10 MG (48) TBPK tablet Take by mouth daily. Use as directed for taper 1 tablet 0  . rizatriptan (MAXALT-MLT) 10 MG disintegrating tablet Take 1 tablet (10 mg total) by mouth as needed for migraine. May repeat in 2 hours if needed 10 tablet 3  . sitaGLIPtin (JANUVIA) 100 MG tablet Take 1 tablet (100 mg total) by mouth daily. 30 tablet 11  . topiramate (TOPAMAX) 50 MG tablet One half tab by mouth daily for one week then one tab by mouth daily. 90 tablet 3  . valACYclovir (VALTREX) 500 MG tablet Take 1 tablet (500 mg total) by mouth at bedtime. 30 tablet 1  . atorvastatin (LIPITOR) 40 MG tablet Take 40 mg by mouth every evening.      No current facility-administered medications on file prior to visit.    Allergies  Allergen Reactions  . Dulaglutide Other (See Comments)  . Sulfur Nausea And Vomiting  . Metformin And Related Diarrhea      Review of Systems:  Constitutional: No recent illness  HEENT: No  headache, no vision change, +concern for thrush   Cardiac: No  chest pain, No  Pressure  Respiratory:  No  shortness of breath now - occasional SOB but better than it was. +Cough  Hem/Onc: No  easy bruising/bleeding, No  abnormal lumps/bumps  Neurologic: No  weakness, No  Dizziness  Psychiatric: No  concerns with depression, No  concerns with anxiety  Exam:  BP 124/70   Pulse 89   Wt 266 lb (120.7 kg)   SpO2 97%   BMI 44.26 kg/m   Constitutional: VS see above. General Appearance: alert, well-developed, well-nourished, NAD  Eyes: Normal lids and conjunctive, non-icteric sclera  Ears, Nose, Mouth, Throat: MMM, Normal external inspection ears/nares/mouth/lips/gums. (+0whitish plaque on tongue  Neck: No masses, trachea midline.   Respiratory: Normal respiratory effort. no wheeze, no rhonchi, no rales  Cardiovascular:  S1/S2 normal, no murmur, no rub/gallop auscultated. RRR.   Musculoskeletal: Gait normal. Symmetric and independent movement of all extremities  Neurological: Normal balance/coordination. No tremor.  Skin: warm, dry, intact.   Psychiatric: Normal judgment/insight. Normal mood and affect. Oriented x3.    Recent Results (from the past 2160 hour(s))  POCT HgB A1C     Status: Abnormal   Collection Time: 08/31/16  2:43 PM  Result Value Ref Range   Hemoglobin A1C 9.2     Comment: %  HM DIABETES EYE EXAM     Status: None   Collection Time: 09/13/16 12:00 AM  Result Value Ref Range   HM Diabetic Eye Exam No Retinopathy No Retinopathy  CBC     Status: Abnormal   Collection Time: 09/13/16  3:45 PM  Result Value Ref Range   WBC 18.3 (H) 3.8 - 10.8 K/uL   RBC 4.96 3.80 - 5.10 MIL/uL   Hemoglobin  12.4 11.7 - 15.5 g/dL   HCT 40.1 35.0 - 45.0 %   MCV 80.8 80.0 - 100.0 fL   MCH 25.0 (L) 27.0 - 33.0 pg   MCHC 30.9 (L) 32.0 - 36.0 g/dL   RDW 18.5 (H) 11.0 - 15.0 %   Platelets 447 (H) 140 - 400 K/uL   MPV 8.7 7.5 - 12.5 fL  Comprehensive metabolic panel     Status: Abnormal   Collection Time: 09/13/16  3:45 PM  Result Value Ref Range   Sodium 139 135 - 146 mmol/L   Potassium 4.2 3.5 - 5.3 mmol/L   Chloride 105 98 - 110 mmol/L   CO2 26 20 - 31 mmol/L   Glucose, Bld 148 (H) 65 - 99 mg/dL   BUN 14 7 - 25 mg/dL   Creat 0.78 0.50 - 1.10 mg/dL   Total Bilirubin 0.3 0.2 - 1.2 mg/dL   Alkaline Phosphatase 119 (H) 33 - 115 U/L   AST 12 10 - 30 U/L   ALT 11 6 - 29 U/L   Total Protein 6.2 6.1 - 8.1 g/dL   Albumin 3.6 3.6 - 5.1 g/dL   Calcium 8.8 8.6 - 10.2 mg/dL  Amylase     Status: None   Collection Time: 09/13/16  3:45 PM  Result Value Ref Range   Amylase 25 21 - 101 U/L  Lipase     Status: Abnormal   Collection Time: 09/13/16  3:45 PM  Result Value Ref Range   Lipase 6 (L) 7 - 60 U/L  CBC w/Diff     Status: Abnormal   Collection Time: 09/16/16 11:05 AM  Result Value Ref Range   WBC  19.2 (H) 3.9 - 10.0 10e3/uL   RBC 4.77 3.70 - 5.32 10e6/uL   HGB 12.1 11.6 - 15.9 g/dL   HCT 39.5 34.8 - 46.6 %   MCV 83 81 - 101 fL   MCH 25.4 (L) 26.0 - 34.0 pg   MCHC 30.6 (L) 32.0 - 36.0 g/dL   RDW 17.9 (H) 11.1 - 15.7 %   Platelets 403 (H) 145 - 400 10e3/uL  CMP     Status: Abnormal   Collection Time: 09/16/16 11:05 AM  Result Value Ref Range   Sodium 138 136 - 145 mEq/L   Potassium 4.2 3.5 - 5.1 mEq/L   Chloride 102 98 - 109 mEq/L   CO2 26 22 - 29 mEq/L   Glucose 197 (H) 70 - 140 mg/dl    Comment: Glucose reference range is for nonfasting patients. Fasting glucose reference range is 70- 100.   BUN 11.9 7.0 - 26.0 mg/dL   Creatinine 0.8 0.6 - 1.1 mg/dL   Total Bilirubin 0.32 0.20 - 1.20 mg/dL   Alkaline Phosphatase 136 40 - 150 U/L   AST 8 5 - 34 U/L   ALT 12 0 - 55 U/L   Total Protein 6.5 6.4 - 8.3 g/dL   Albumin 3.2 (L) 3.5 - 5.0 g/dL   Calcium 9.0 8.4 - 10.4 mg/dL   Anion Gap 10 3 - 11 mEq/L   EGFR 85 (L) >90 ml/min/1.73 m2    Comment: eGFR is calculated using the CKD-EPI Creatinine Equation (2009)  Ferritin     Status: None   Collection Time: 09/16/16 11:05 AM  Result Value Ref Range   Ferritin 29 9 - 269 ng/ml  Iron and TIBC     Status: Abnormal   Collection Time: 09/16/16 11:05 AM  Result Value Ref Range  Iron 38 (L) 41 - 142 ug/dL   TIBC 311 236 - 444 ug/dL   UIBC 273 120 - 384 ug/dL   %SAT 12 (L) 21 - 57 %  Vitamin B12     Status: None   Collection Time: 09/16/16 11:05 AM  Result Value Ref Range   Vitamin B12 599 232 - 1,245 pg/mL  Reticulocytes     Status: None   Collection Time: 09/16/16 11:05 AM  Result Value Ref Range   Reticulocyte Count 1.3 0.6 - 2.6 %  Manual Differential ([facility])     Status: Abnormal   Collection Time: 09/16/16 11:05 AM  Result Value Ref Range   ANC ([facility] manual diff) 16.0 (H) 1.5 - 6.7 10e3/uL   ALC 2.7 (H) 0.6 - 2.2 10e3/uL   SEG 77 (H) 40 - 75 %   Band Neutrophils 4 0 - 10 %   LYMPH 14 14 - 48 %   MONO 2 0 -  13 %   Eos 1 0 - 7 %   Metamyelocytes 1 (H) 0 - 0 %   Myelocytes 1 (H) 0 - 0 %   RBC Comments Within Normal Limits    PLT EST [place] Adequate Adequate   Platelet Morphology Large Platelets Within Normal Limits  POCT HgB A1C     Status: None   Collection Time: 10/13/16 11:14 AM  Result Value Ref Range   Hemoglobin A1C 10.6     No results found.  No flowsheet data found.  Depression screen [facility] 2/9 05/05/2016 12/24/2015  Decreased Interest 2 0  Down, Depressed, Hopeless 1 0  PHQ - 2 Score 3 0  Altered sleeping 3 -  Tired, decreased energy 3 -  Change in appetite 2 -  Feeling bad or failure about yourself  0 -  Trouble concentrating 1 -  Moving slowly or fidgety/restless 0 -  Suicidal thoughts 0 -  PHQ-9 Score 12 -    PFT INTERPRETATION; 02/09/16 --> 10/28/16   FEV1/FVC >70 *AND*  FEV1 >80% predicted  = NORMAL SPIROMETRY NORMAL? no  1. Valid study? yes 2. Flow-Volume Loop: normal 3. FEV1/FVC: 86% predicted --> 95% predicted 3. Severity of Obstruction ~ Post-Bronchodilator FEV1: 2% change --> 4% change 5. Bronchodilator Challenge: no effect 6. FVC <80% = Restrictive yes 78% pred --> 64% 7. Diffusion Capacity - refer to Pulm needed? yes   ASSESSMENT/PLAN:   Restrictive lung disease - Plan: Ambulatory referral to Pulmonology  COPD mixed type ([place]) - Less suspicous for COPD - seek pulm guidance - Plan: Spirometry: Pre & Post Eval  Type 2 diabetes mellitus with hyperglycemia, with long-term current use of insulin ([facility]) - glucose control will also prevent fungal infections  Yeast infection - Plan: fluconazole (DIFLUCAN) 150 MG tablet  Oral thrush - Plan: Diphenhyd-Hydrocort-Nystatin (FIRST-DUKES MOUTHWASH) SUSP      Follow-up plan: Return if symptoms worsen or fail to improve.  Visit summary with medication list and pertinent instructions was printed for patient to review, alert [place] if any changes needed. All questions at time of visit were answered - patient  instructed to contact office with any additional concerns. ER/RTC precautions were reviewed with the patient and understanding verbalized.   Note: Total time spent 25 minutes, greater than 50% of the visit was spent face-to-face counseling and coordinating care for the following: The primary encounter diagnosis was Restrictive lung disease. Diagnoses of COPD mixed type ([place]), Type 2 diabetes mellitus with hyperglycemia, with long-term current use of insulin (  [place]), Yeast infection, and Oral thrush were also pertinent to this visit.Marland Kitchen

## 2016-10-28 NOTE — Telephone Encounter (Signed)
Alternatives in same category are onglyza and tradjenta.  Are either of these OK?

## 2016-10-28 NOTE — Telephone Encounter (Signed)
Looks good. Please continue the same medications. I'll see you next time.

## 2016-10-28 NOTE — Telephone Encounter (Signed)
Patient called in blood sugar readings  10/22/16 Morning 210 Night 247  10/23/16 Morning 120 Night 230  10/24/16 Morning 110 Night 125  10/24/16 Morning 110 Night 113  10/27/14 Morning 115 Night 125  10/27/16 Morning 150 Night 163  Patient also stated she is on last pen of insulin aspart (NOVOLOG FLEXPEN) 100 UNIT/ML FlexPen. Please call patient and advise. OK to leave message.

## 2016-10-28 NOTE — Telephone Encounter (Signed)
Routing to you °

## 2016-10-28 NOTE — Telephone Encounter (Signed)
Advised patient that it was ok for her to take Diflucan before procedure tomorrow.  Pt verbalized understanding.

## 2016-10-28 NOTE — Patient Instructions (Signed)
Based on your tests and on listening to the lungs, I'm more suspicious of restrictive lung disease as opposed to COPD or Asthma. We need to send you to a specialist for additional testing to confirm this diagnosis. You should be hearing back within a week about a referral to a pulmonologist. If you're feeling worse, please come see [place] or seek emergency care!

## 2016-10-28 NOTE — Telephone Encounter (Signed)
Ok, I have sent a prescription to your pharmacy 

## 2016-10-29 ENCOUNTER — Encounter: Payer: Self-pay | Admitting: Gastroenterology

## 2016-10-29 ENCOUNTER — Ambulatory Visit (AMBULATORY_SURGERY_CENTER): Payer: Medicaid Other | Admitting: Gastroenterology

## 2016-10-29 VITALS — BP 113/69 | HR 75 | Temp 98.0°F | Resp 16 | Ht 65.0 in | Wt 265.0 lb

## 2016-10-29 DIAGNOSIS — Z86010 Personal history of colonic polyps: Secondary | ICD-10-CM | POA: Diagnosis not present

## 2016-10-29 DIAGNOSIS — D508 Other iron deficiency anemias: Secondary | ICD-10-CM

## 2016-10-29 MED ORDER — HYDROCORTISONE ACETATE 25 MG RE SUPP: 25.0000 mg | Freq: Every day | RECTAL | 1 refills | Status: DC

## 2016-10-29 MED ORDER — SODIUM CHLORIDE 0.9 % IV SOLN: 500.0000 mL | INTRAVENOUS | Status: DC

## 2016-10-29 NOTE — Progress Notes (Signed)
Report to PACU, RN, vss, BBS= Clear.  

## 2016-10-29 NOTE — Patient Instructions (Signed)
Handout given: Diverticulosis and Hemorrhoid.    YOU HAD AN ENDOSCOPIC PROCEDURE TODAY AT [facility]:   Refer to the procedure report that was given to you for any specific questions about what was found during the examination.  If the procedure report does not answer your questions, please call your gastroenterologist to clarify.  If you requested that your care partner not be given the details of your procedure findings, then the procedure report has been included in a sealed envelope for you to review at your convenience later.  YOU SHOULD EXPECT: Some feelings of bloating in the abdomen. Passage of more gas than usual.  Walking can help get rid of the air that was put into your GI tract during the procedure and reduce the bloating. If you had a lower endoscopy (such as a colonoscopy or flexible sigmoidoscopy) you may notice spotting of blood in your stool or on the toilet paper. If you underwent a bowel prep for your procedure, you may not have a normal bowel movement for a few days.  Please Note:  You might notice some irritation and congestion in your nose or some drainage.  This is from the oxygen used during your procedure.  There is no need for concern and it should clear up in a day or so.  SYMPTOMS TO REPORT IMMEDIATELY:   Following lower endoscopy (colonoscopy or flexible sigmoidoscopy):  Excessive amounts of blood in the stool  Significant tenderness or worsening of abdominal pains  Swelling of the abdomen that is new, acute  Fever of 100F or higher   For urgent or emergent issues, a gastroenterologist can be reached at any hour by calling [phone].   DIET:  We do recommend a small meal at first, but then you may proceed to your regular diet.  Drink plenty of fluids but you should avoid alcoholic beverages for 24 hours.  ACTIVITY:  You should plan to take it easy for the rest of today and you should NOT DRIVE or use heavy machinery until tomorrow  (because of the sedation medicines used during the test).    FOLLOW UP: Our staff will call the number listed on your records the next business day following your procedure to check on you and address any questions or concerns that you may have regarding the information given to you following your procedure. If we do not reach you, we will leave a message.  However, if you are feeling well and you are not experiencing any problems, there is no need to return our call.  We will assume that you have returned to your regular daily activities without incident.  If any biopsies were taken you will be contacted by phone or by letter within the next 1-3 weeks.  Please call us at [phone] if you have not heard about the biopsies in 3 weeks.    SIGNATURES/CONFIDENTIALITY: You and/or your care partner have signed paperwork which will be entered into your electronic medical record.  These signatures attest to the fact that that the information above on your After Visit Summary has been reviewed and is understood.  Full responsibility of the confidentiality of this discharge information lies with you and/or your care-partner.

## 2016-10-29 NOTE — Op Note (Signed)
[place] Patient Name: Tabitha Estrada Procedure Date: 10/29/2016 3:20 PM MRN: [id number] Endoscopist: Mauri Pole , MD Age: 46 Referring MD:  Date of Birth: [birth year] Gender: Female Account #: [id number] Procedure:                Colonoscopy Indications:              High risk colon cancer surveillance: Personal                            history of colonic polyps Medicines:                Monitored Anesthesia Care Procedure:                Pre-Anesthesia Assessment:                           - Prior to the procedure, a History and Physical                            was performed, and patient medications and                            allergies were reviewed. The patient's tolerance of                            previous anesthesia was also reviewed. The risks                            and benefits of the procedure and the sedation                            options and risks were discussed with the patient.                            All questions were answered, and informed consent                            was obtained. Prior Anticoagulants: The patient has                            taken no previous anticoagulant or antiplatelet                            agents. ASA Grade Assessment: III - A patient with                            severe systemic disease. After reviewing the risks                            and benefits, the patient was deemed in                            satisfactory condition to undergo the procedure.  After obtaining informed consent, the colonoscope                            was passed under direct vision. Throughout the                            procedure, the patient's blood pressure, pulse, and                            oxygen saturations were monitored continuously. The                            Colonoscope was introduced through the anus and                            advanced to the the cecum,  identified by                            appendiceal orifice and ileocecal valve. The                            colonoscopy was performed without difficulty. The                            patient tolerated the procedure well. The quality                            of the bowel preparation was excellent. The                            ileocecal valve, appendiceal orifice, and rectum                            were photographed. Scope In: 3:28:43 PM Scope Out: 3:41:09 PM Scope Withdrawal Time: 0 hours 9 minutes 22 seconds  Total Procedure Duration: 0 hours 12 minutes 26 seconds  Findings:                 The perianal and digital rectal examinations were                            normal.                           Scattered small and large-mouthed diverticula were                            found in the sigmoid colon, descending colon and                            ascending colon. Peri-diverticular erythema was                            seen. There was evidence of an impacted  diverticulum. There was no evidence of diverticular                            bleeding.                           Non-bleeding internal hemorrhoids were found during                            retroflexion. The hemorrhoids were small.                           The exam was otherwise without abnormality. Complications:            No immediate complications. Estimated Blood Loss:     Estimated blood loss: none. Impression:               - Moderate diverticulosis in the sigmoid colon, in                            the descending colon and in the ascending colon.                            Peri-diverticular erythema was seen. There was                            evidence of an impacted diverticulum. There was no                            evidence of diverticular bleeding.                           - Non-bleeding internal hemorrhoids.                           - The examination was  otherwise normal.                           - No specimens collected. Recommendation:           - Patient has a contact number available for                            emergencies. The signs and symptoms of potential                            delayed complications were discussed with the                            patient. Return to normal activities tomorrow.                            Written discharge instructions were provided to the                            patient.                           -  Resume previous diet.                           - Continue present medications.                           - Repeat colonoscopy in 5 years for surveillance. Mauri Pole, MD 10/29/2016 3:46:01 PM This report has been signed electronically.

## 2016-10-29 NOTE — Telephone Encounter (Signed)
Patient notified

## 2016-11-02 ENCOUNTER — Telehealth: Payer: Self-pay | Admitting: *Deleted

## 2016-11-02 ENCOUNTER — Telehealth: Payer: Self-pay | Admitting: Endocrinology

## 2016-11-02 NOTE — Telephone Encounter (Signed)
No answer, message left for the patient. 

## 2016-11-02 NOTE — Telephone Encounter (Signed)
Called [facility] & started PA. Was informed I could check status in 24hrs. PA# [id number] recipient ID for the call# [id number].

## 2016-11-02 NOTE — Telephone Encounter (Signed)
Pharmacy states that [employer] needs a prior auth. Patient told pharmacy that Dr. Loanne Drilling was changing medication. Pharmacy needs clarification.

## 2016-11-02 NOTE — Telephone Encounter (Signed)
Routing to you °

## 2016-11-02 NOTE — Telephone Encounter (Deleted)
  Follow up Call-  Call back number 10/29/2016  Post procedure Call Back phone  # [phone]  Permission to leave phone message Yes  Some recent data might be hidden     Patient questions:  Do you have a fever, pain , or abdominal swelling? {yes no:314532} Pain Score  {NUMBERS; 0-10:5044} *  Have you tolerated food without any problems? {yes no:314532}  Have you been able to return to your normal activities? {yes no:314532}  Do you have any questions about your discharge instructions: Diet   {yes no:314532} Medications  {yes no:314532} Follow up visit  {yes no:314532}  Do you have questions or concerns about your Care? {yes no:314532}  Actions: * If pain score is 4 or above: {ACTION; [facility] ENDO PAIN >4:21563::"No action needed, pain <4."}

## 2016-11-09 ENCOUNTER — Ambulatory Visit (INDEPENDENT_AMBULATORY_CARE_PROVIDER_SITE_OTHER): Payer: Medicaid Other | Admitting: Allergy and Immunology

## 2016-11-09 ENCOUNTER — Encounter: Payer: Self-pay | Admitting: Allergy and Immunology

## 2016-11-09 VITALS — BP 116/80 | HR 100 | Temp 98.6°F | Resp 22 | Ht 64.8 in | Wt 269.8 lb

## 2016-11-09 DIAGNOSIS — G4733 Obstructive sleep apnea (adult) (pediatric): Secondary | ICD-10-CM | POA: Diagnosis not present

## 2016-11-09 DIAGNOSIS — J3089 Other allergic rhinitis: Secondary | ICD-10-CM

## 2016-11-09 DIAGNOSIS — J4550 Severe persistent asthma, uncomplicated: Secondary | ICD-10-CM

## 2016-11-09 DIAGNOSIS — E669 Obesity, unspecified: Secondary | ICD-10-CM | POA: Diagnosis not present

## 2016-11-09 DIAGNOSIS — F17200 Nicotine dependence, unspecified, uncomplicated: Secondary | ICD-10-CM | POA: Diagnosis not present

## 2016-11-09 DIAGNOSIS — K219 Gastro-esophageal reflux disease without esophagitis: Secondary | ICD-10-CM | POA: Diagnosis not present

## 2016-11-09 DIAGNOSIS — B999 Unspecified infectious disease: Secondary | ICD-10-CM

## 2016-11-09 DIAGNOSIS — L501 Idiopathic urticaria: Secondary | ICD-10-CM

## 2016-11-09 DIAGNOSIS — E66811 Obesity, class 1: Secondary | ICD-10-CM

## 2016-11-09 DIAGNOSIS — L299 Pruritus, unspecified: Secondary | ICD-10-CM

## 2016-11-09 MED ORDER — BUDESONIDE-FORMOTEROL FUMARATE 160-4.5 MCG/ACT IN AERO: INHALATION_SPRAY | RESPIRATORY_TRACT | 5 refills | Status: DC

## 2016-11-09 MED ORDER — MONTELUKAST SODIUM 10 MG PO TABS: 10.0000 mg | ORAL_TABLET | Freq: Every day | ORAL | 5 refills | Status: DC

## 2016-11-09 MED ORDER — CETIRIZINE HCL 10 MG PO TABS: ORAL_TABLET | ORAL | 5 refills | Status: DC

## 2016-11-09 MED ORDER — TIOTROPIUM BROMIDE MONOHYDRATE 18 MCG IN CAPS: ORAL_CAPSULE | RESPIRATORY_TRACT | 5 refills | Status: DC

## 2016-11-09 MED ORDER — ALBUTEROL SULFATE (2.5 MG/3ML) 0.083% IN NEBU: INHALATION_SOLUTION | RESPIRATORY_TRACT | 1 refills | Status: AC

## 2016-11-09 MED ORDER — RANITIDINE HCL 300 MG PO TABS: 300.0000 mg | ORAL_TABLET | Freq: Every day | ORAL | 5 refills | Status: DC

## 2016-11-09 MED ORDER — OMEPRAZOLE 40 MG PO CPDR: 40.0000 mg | DELAYED_RELEASE_CAPSULE | ORAL | 5 refills | Status: DC

## 2016-11-09 MED ORDER — FLUTICASONE PROPIONATE 50 MCG/ACT NA SUSP: NASAL | 5 refills | Status: DC

## 2016-11-09 NOTE — Patient Instructions (Addendum)
  1. Allergen avoidance measures?  2. Treat and prevent inflammation:   A. Symbicort 160 - 2 inhalations twice a day with spacer  B. Spiriva HandiHaler - one inhalation 1 time per day  C. Flonase - 2 sprays each nostril one time per day  D. montelukast 10 mg - one tablet one time per day  3. Treat and prevent reflux:   A. consolidate caffeine as much as possible  B. increase omeprazole to 40 mg in a.m.  C. start ranitidine 300 mg in PM  4. If needed:   A. Albuterol (not ipratropium) nebulization every 4-6 hours  B. ProAir HFA 2 puffs every 4-6 hours  C. nasal saline  D. cetirizine 10 mg one tablet 1-2 times a day  5. Blood - alpha 1 antitrypsin phenotype and level, antimitochondrial antibody, IgA/G/M  6. Use a nicotine substitute to replace tobacco smoke exposure  7. Obtain a fall flu vaccine  8. Return to clinic in 3 weeks or earlier if problem

## 2016-11-09 NOTE — Progress Notes (Signed)
Dear Dr. Sheppard Coil,  Thank you for referring Tabitha Estrada to the [facility] on 11/09/2016.   Below is a summation of this patient's evaluation and recommendations.  Thank you for your referral. I will keep you informed about this patient's response to treatment.   If you have any questions please do not hesitate to contact me.   Sincerely,  Jiles Prows, MD Allergy / Immunology [place]   ______________________________________________________________________    NEW PATIENT NOTE  Referring Provider: Emeterio Reeve, DO Primary Provider: Emeterio Reeve, DO Date of office visit: 11/09/2016    Subjective:   Chief Complaint:  Tabitha Estrada (DOB: 10/17/1970) is a 46 y.o. female who presents to the clinic on 11/09/2016 with a chief complaint of Nasal Congestion .     HPI: Tabitha Estrada presents to this clinic in evaluation of respiratory tract symptoms of many years duration.  She complains of having "congestion". By this term she means that she feels as though her head is full and her nose is occasionally congested and she has to blow her nose with clear to yellow nasal discharge and has intermittent anosmia and also has coughing and wheezing and postnasal drip down in her throat along with throat clearing and very raspy voice and a "furball" stuck in her throat. These symptoms appear to occur on a perennial basis with exacerbation during the spring and fall and are triggered by dust and mold grass exposure and possibly exposure to her cat. She cannot really exert herself very much because of shortness of breath. She does cough so hard that she will occasionally vomit and she has developed laryngeal spasm where she can't breathe in for several seconds.  Therapy to date for her respiratory tract symptoms as included multiple courses of antibiotics and systemic steroids over the  course of the past several years. Last month she received 2 antibiotics and two courses of systemic steroids in an attempt to get her condition under better control. She is not sure that any type of inhaler that has been administered in the past has helped her to any degree.  She also appears to have reflux with regurgitation that is under pretty good control on her current therapy which includes a proton pump inhibitor. She drinks a liter of Dr. Malachi Bonds per day and intermittently eats chocolate.  She also smokes tobacco at a rate of one pack per day since about the age of 33. She has tried Chantix on one occasion which was inefficient in eliminating her tobacco use. She smokes for stress reduction.  She also has untreated sleep apnea for the past 5 years. It sounds as though she intermittently attempts to use her CPAP machine but is usually unsuccessful in keeping it on at night.  Finally, she also has a history of itching. She will develop bouts of global itching that will last a day or so usually several times per month without any associated systemic or constitutional symptoms and without any obvious trigger.  Past Medical History:  Diagnosis Date  . Allergy   . Anemia   . Anxiety   . Arthritis   . Asthma   . COPD (chronic obstructive pulmonary disease) ([place])   . Depression   . Diabetes mellitus without complication ([place])   . Dyspnea    with exertion and bronchititis  . Fibromyalgia   . GERD (gastroesophageal reflux disease)   . History  of hiatal hernia   . History of kidney stones   . Hyperlipidemia   . Neuromuscular disorder ([place])   . Pneumonia    hs, 01-19-15  . Sleep apnea    has Cpap have not been using    Past Surgical History:  Procedure Laterality Date  . BACK SURGERY  2015,2016, 2018  . CARPAL TUNNEL RELEASE Right 2008  . CHOLECYSTECTOMY  1992  . KIDNEY STONE SURGERY  2015  . TONSILLECTOMY  1979  . TUBAL LIGATION  2005    Allergies as of 11/09/2016       Reactions   Dulaglutide Other (See Comments)   Severe stomach pain   Sulfur Nausea And Vomiting   Metformin And Related Diarrhea      Medication List      albuterol 108 (90 Base) MCG/ACT inhaler Commonly known as:  PROVENTIL HFA;VENTOLIN HFA Inhale 1-2 puffs into the lungs every 4 (four) hours as needed for wheezing or shortness of breath.   atorvastatin 40 MG tablet Commonly known as:  LIPITOR Take 40 mg by mouth daily.   BIOTIN PO Take by mouth daily.   BLOOD GLUCOSE TEST STRIPS Strp 1 each by Other route 3 (three) times a day. Use as instructed   cetirizine 10 MG tablet Commonly known as:  ZYRTEC Take 1 tablet (10 mg total) by mouth 2 (two) times daily.   cholecalciferol 1000 units tablet Commonly known as:  VITAMIN D Take 3,000 Units by mouth daily.   clobetasol ointment 0.05 % Commonly known as:  TEMOVATE Apply 1 application topically 2 (two) times daily.   docusate sodium 100 MG capsule Commonly known as:  COLACE Take 1 capsule (100 mg total) by mouth 2 (two) times daily.   FIRST-DUKES MOUTHWASH Susp Use as directed 5-10 mLs in the mouth or throat 4 (four) times daily.   fluticasone 50 MCG/ACT nasal spray Commonly known as:  FLONASE Place 2 sprays into both nostrils daily.   FREESTYLE LIBRE SENSOR SYSTEM Misc 3 sensors for 30 days(1 month supply) change every 10 days.   gabapentin 400 MG capsule Commonly known as:  NEURONTIN Take 3 capsules (1,200 mg total) by mouth 3 (three) times daily as needed (neuropathic pain). Max dose 3600 mg /24 hours   Glycopyrrolate-Formoterol 9-4.8 MCG/ACT Aero Commonly known as:  BEVESPI AEROSPHERE Inhale 1 puff into the lungs daily.   insulin aspart 100 UNIT/ML FlexPen Commonly known as:  NOVOLOG FLEXPEN Inject 28 Units into the skin 3 (three) times daily with meals.   Insulin Glargine 100 UNIT/ML Solostar Pen Commonly known as:  LANTUS SOLOSTAR Inject 80 Units into the skin daily. And pen needles 5/day   Insulin  Pen Needle 32G X 4 MM Misc by Does not apply route.   ipratropium-albuterol 0.5-2.5 (3) MG/3ML Soln Commonly known as:  DUONEB Take 3 mLs by nebulization every 2 (two) hours as needed (wheeze, SOB).   nystatin powder Commonly known as:  MYCOSTATIN/NYSTOP Apply 1 g topically 2 (two) times daily as needed.   omeprazole 20 MG capsule Commonly known as:  PRILOSEC Take 1 capsule (20 mg total) by mouth daily.   ondansetron 8 MG disintegrating tablet Commonly known as:  ZOFRAN-ODT Dissolve 1 tablet (8 mg total) by mouth every 8 (eight) hours as needed for nausea.   oxyCODONE-acetaminophen 5-325 MG tablet Commonly known as:  PERCOCET/ROXICET Take by mouth.   QVAR 80 MCG/ACT inhaler Generic drug:  beclomethasone Inhale 2 puffs into the lungs daily.   rizatriptan 10 MG  disintegrating tablet Commonly known as:  MAXALT-MLT Take 1 tablet (10 mg total) by mouth as needed for migraine. May repeat in 2 hours if needed   tamsulosin 0.4 MG Caps capsule Commonly known as:  FLOMAX Take 0.4 mg by mouth daily.   topiramate 50 MG tablet Commonly known as:  TOPAMAX One half tab by mouth daily for one week then one tab by mouth daily.   valACYclovir 500 MG tablet Commonly known as:  VALTREX Take 1 tablet (500 mg total) by mouth at bedtime.   Vitamin B-12 5000 MCG Tbdp Take 5,000 mcg by mouth daily.       Review of systems negative except as noted in HPI / PMHx or noted below:  Review of Systems  Constitutional: Negative.   HENT: Negative.   Eyes: Negative.   Respiratory: Negative.   Cardiovascular: Negative.   Gastrointestinal: Negative.   Genitourinary: Negative.   Musculoskeletal: Negative.   Skin: Negative.   Neurological: Negative.   Endo/Heme/Allergies: Negative.   Psychiatric/Behavioral: Negative.     Family History  Problem Relation Age of Onset  . Diabetes Father   . Hyperlipidemia Father   . Hypertension Father   . Diabetes Paternal Aunt   . Hypertension  Paternal Aunt   . Heart attack Paternal Uncle   . Emphysema Maternal Grandmother   . Heart failure Maternal Grandmother   . Cancer Maternal Grandfather   . Colon cancer Neg Hx     Social History   Social History  . Marital status: Married    Spouse name: N/A  . Number of children: N/A  . Years of education: N/A   Occupational History  . Not on file.   Social History Main Topics  . Smoking status: Current Every Day Smoker    Packs/day: 0.75    Years: 33.00    Types: Cigarettes  . Smokeless tobacco: Never Used     Comment: 05/17/2016 still smokes  . Alcohol use No  . Drug use: No  . Sexual activity: Not on file   Other Topics Concern  . Not on file   Social History Narrative  . No narrative on file    Environmental and Social history  Lives in a mobile home with a dry environment, a cat located inside the household, carpeting in the bedroom, no plastic on the bed, no plastic on the pillow, actually smoking tobacco products and no employment at this point.   Objective:   Vitals:   11/09/16 1348  BP: 116/80  Pulse: 100  Resp: (!) 22  Temp: 98.6 F (37 C)  SpO2: 93%   Height: 5' 4.8" (164.6 cm) Weight: 269 lb 12.8 oz (122.4 kg)  Physical Exam  Constitutional: She is well-developed, well-nourished, and in no distress.  Obese, raspy voice, strong smell of tobacco smoke  HENT:  Head: Normocephalic.  Right Ear: Tympanic membrane, external ear and ear canal normal.  Left Ear: Tympanic membrane, external ear and ear canal normal.  Nose: Mucosal edema present. No rhinorrhea.  Mouth/Throat: Uvula is midline, oropharynx is clear and moist and mucous membranes are normal. No oropharyngeal exudate.  Eyes: Conjunctivae are normal.  Neck: Trachea normal. No tracheal tenderness present. No tracheal deviation present. No thyromegaly present.  Cardiovascular: Normal rate, regular rhythm, S1 normal, S2 normal and normal heart sounds.   No murmur heard. Pulmonary/Chest:  No stridor. No respiratory distress. She has wheezes (bilateral expiratory wheezes posterior lung fields). She has no rales.  Musculoskeletal: She exhibits edema (slight pitting  edema left lower extremity ankle region).  Lymphadenopathy:       Head (right side): No tonsillar adenopathy present.       Head (left side): No tonsillar adenopathy present.    She has no cervical adenopathy.  Neurological: She is alert.  Skin: No rash noted. She is not diaphoretic. No erythema. Nails show no clubbing.  Psychiatric: Mood and affect normal.    Diagnostics: Allergy skin tests were performed. She did not demonstrate any hypersensitivity against a screening panel of aeroallergens or foods.  Spirometry was performed and demonstrated an FEV1 of 1.92 @ 63 % of predicted. Following the administration of nebulized albuterol her FEV1 rose to 1.98 which was an increase in the FEV1 of only 3%.  Oxygen saturation at rest on room air was 93%  Results of a chest x-ray obtained 10/01/2016 identify the following:  Normal heart size. Lungs clear. No pneumothorax. No pleural Effusion.  Results of a brain MRI obtained 10/11/2016 identify the following:  INTRACRANIAL CONTENTS: No reduced diffusion to suggest acute ischemia, hyperacute demyelination or hypercellular tumor. No susceptibility artifact to suggest hemorrhage. The ventricles and sulci are normal for patient's age. No suspicious parenchymal signal, masses, mass effect. No abnormal intraparenchymal or extra-axial enhancement. No abnormal extra-axial fluid collections. No extra-axial masses.  VASCULAR: Normal major intracranial vascular flow voids present at skull base.  SKULL AND UPPER CERVICAL SPINE: No abnormal sellar expansion. No suspicious calvarial bone marrow signal. Craniocervical junction maintained.  SINUSES/ORBITS: Trace paranasal sinus mucosal thickening with scattered small mucosal retention cyst. Mastoid air cells are  well aerated. The included ocular globes and orbital contents are non-suspicious.  Results of blood tests obtained 09/16/2016 identified a white blood cell count 19.2, neutrophilia, hemoglobin 12.1, platelet 403, reticulocyte count 1.3%, normal hepatic and renal function, glucose 197, albumin at 3.2 g/dl  Results of blood tests obtained 05/17/2016 identified a white blood cell count 17.6 with a absolute eosinophil count 300, absolute lymphocyte count 2500, hemoglobin 12.9, platelet 285.  Assessment and Plan:    1. Not well controlled severe persistent asthma   2. Other allergic rhinitis   3. LPRD (laryngopharyngeal reflux disease)   4. Obesity (BMI 30.0-34.9)   5. Heavy tobacco smoker   6. Obstructive sleep apnea syndrome   7. Pruritic disorder   8. Recurrent infections   9. Chronic idiopathic urticaria   10. Pruritic condition     1. Allergen avoidance measures?  2. Treat and prevent inflammation:   A. Symbicort 160 - 2 inhalations twice a day with spacer  B. Spiriva HandiHaler - one inhalation 1 time per day  C. Flonase - 2 sprays each nostril one time per day  D. montelukast 10 mg - one tablet one time per day  3. Treat and prevent reflux:   A. consolidate caffeine as much as possible  B. increase omeprazole to 40 mg in a.m.  C. start ranitidine 300 mg in PM  4. If needed:   A. Albuterol (not ipratropium) nebulization every 4-6 hours  B. ProAir HFA 2 puffs every 4-6 hours  C. nasal saline  D. cetirizine 10 mg one tablet 1-2 times a day  5. Blood - alpha 1 antitrypsin phenotype and level, antimitochondrial antibody, IgA/G/M  6. Use a nicotine substitute to replace tobacco smoke exposure  7. Obtain a fall flu vaccine  8. Return to clinic in 3 weeks or earlier if problem  I am not sure that Tabitha Estrada has a significant atopic trigger giving rise to her  respiratory tract inflammation and her respiratory tract symptoms may be on the basis of reflux-induced respiratory  disease, tobacco smoke exposure, and possibly a contribution from her untreated sleep apnea. I will investigate alpha-1 antitrypsin phenotype. She will use a collection of anti-inflammatory medications for her respiratory tract as noted above and aggressively treat her reflux as noted above. I had a long talk with her today about the need to find a nicotine substitute to replace her tobacco smoke exposure. Finally, she does appear to have a pruritic disorder and I am going to check an antimitochondrial antibody to rule out primary biliary cirrhosis and because she has a history of recurrent infections will screen her blood with a check of her immunoglobulins.  Jiles Prows, MD Allergy / Immunology [facility]

## 2016-11-10 ENCOUNTER — Telehealth: Payer: Self-pay | Admitting: Endocrinology

## 2016-11-10 NOTE — Telephone Encounter (Signed)
Pharmacy called needing PA for Tradjenta 5 MG for patient. Please call pharmacy and advise.

## 2016-11-11 NOTE — Telephone Encounter (Signed)
Called [facility] and PA was not approved. They want a try & failure of metformin or another preferred medication?

## 2016-11-11 NOTE — Telephone Encounter (Signed)
I called [facility] and did the PA over the phone. I can call them back? I just didn't see in patient's chart where she had ever taken metformin.

## 2016-11-11 NOTE — Telephone Encounter (Signed)
Pt did try metformin, and did not tolerate.  Can I see the PA form?  Thank you.

## 2016-11-11 NOTE — Telephone Encounter (Signed)
Yes, thank you.  The HPI in my note says she did not tolerate metformin (N/V/D)

## 2016-11-12 LAB — IGG, IGA, IGM
IgA/Immunoglobulin A, Serum: 138 mg/dL (ref 87–352)
IgG (Immunoglobin G), Serum: 675 mg/dL — ABNORMAL LOW (ref 700–1600)
IgM (Immunoglobulin M), Srm: 86 mg/dL (ref 26–217)

## 2016-11-12 LAB — MITOCHONDRIAL ANTIBODIES: Mitochondrial Ab: 5.1 Units (ref 0.0–20.0)

## 2016-11-12 LAB — ALPHA-1 ANTITRYPSIN PHENOTYPE: A1 ANTITRYPSIN: 176 mg/dL (ref 90–200)

## 2016-11-15 ENCOUNTER — Encounter: Payer: Self-pay | Admitting: Emergency Medicine

## 2016-11-15 ENCOUNTER — Ambulatory Visit (INDEPENDENT_AMBULATORY_CARE_PROVIDER_SITE_OTHER): Payer: Medicaid Other | Admitting: Emergency Medicine

## 2016-11-15 DIAGNOSIS — J449 Chronic obstructive pulmonary disease, unspecified: Secondary | ICD-10-CM | POA: Diagnosis not present

## 2016-11-15 DIAGNOSIS — G4733 Obstructive sleep apnea (adult) (pediatric): Secondary | ICD-10-CM | POA: Diagnosis not present

## 2016-11-15 DIAGNOSIS — J984 Other disorders of lung: Secondary | ICD-10-CM

## 2016-11-15 DIAGNOSIS — R05 Cough: Secondary | ICD-10-CM

## 2016-11-15 DIAGNOSIS — Z9989 Dependence on other enabling machines and devices: Secondary | ICD-10-CM | POA: Diagnosis not present

## 2016-11-15 DIAGNOSIS — R053 Chronic cough: Secondary | ICD-10-CM

## 2016-11-15 MED ORDER — LINAGLIPTIN 5 MG PO TABS: 5.0000 mg | ORAL_TABLET | Freq: Every day | ORAL | 11 refills | Status: DC

## 2016-11-15 NOTE — Telephone Encounter (Signed)
I have sent a prescription to your pharmacy, for tradjenta. I'll see you next time.

## 2016-11-15 NOTE — Telephone Encounter (Signed)
I called [facility] and trajenta was approved approval #[id number] through 11/10/17. I just need to know what prescription to send in.

## 2016-11-15 NOTE — Assessment & Plan Note (Signed)
Chest x-ray reassuring. No evidence interstitial disease. I suspect that the most likely explanation is her weight. No other evidence to suggest autoimmune disease.

## 2016-11-15 NOTE — Progress Notes (Signed)
Subjective:    Patient ID: Tabitha Estrada, female    DOB: 1970-05-16, 46 y.o.   MRN: [id number]  HPI 46 year old woman, active tobacco use (30+ pack years), obesity, obstructive sleep apnea (unable to tolerate CPAP), DM, allergic rhinitis, GERD. She has been seen by Dr. Neldon Mc for suspected obstructive lung disease, allergic rhinitis. She was first told that she might have asthma 4-5 yrs ago. She has a longstanding cough, usually non-productive. She has 4-5 episode a year, hoarseness, wheeze, head and chest congestion. Some increased colored mucous. Usually gets treated with steroids and abx under these circumstances.   She is currently managed on Spiriva, Symbicort for last 2 weeks; formerly on bevespi + qvar. Also Singulair, fluticasone on her regimen. Her GERD has been treated with Zantac and omeprazole, seems to be controlled.   Spirometry was performed on 11/09/16 that I have personally reviewed. This shows evidence for mixed obstruction and restriction with an FEV1 of 1.98 L (65% predicted) and an FEV1 to FVC ratio of 78%. There was bronchodilator response.  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 Antimitochondrial antibody negative, immunoglobulins normal with the exception of a slightly decreased IgG  She tells me that her sleep study was 4 yrs ago, [facility]. She has a full face mask, has trouble with wearing it. She has gained 20 lbs over the last year.    Review of Systems  Constitutional: Negative for fever and unexpected weight change.  HENT: Negative for congestion, dental problem, ear pain, nosebleeds, postnasal drip, rhinorrhea, sinus pressure, sneezing, sore throat and trouble swallowing.   Eyes: Negative for redness and itching.  Respiratory: Positive for cough, shortness of breath and wheezing. Negative for chest tightness.   Cardiovascular: Negative for palpitations and leg swelling.  Gastrointestinal: Negative for nausea and vomiting.  Genitourinary:  Negative for dysuria.  Musculoskeletal: Negative for joint swelling.  Skin: Negative for rash.  Neurological: Negative for headaches.  Hematological: Does not bruise/bleed easily.  Psychiatric/Behavioral: Negative for dysphoric mood. The patient is not nervous/anxious.    Past Medical History:  Diagnosis Date  . Allergy   . Anemia   . Anxiety   . Arthritis   . Asthma   . COPD (chronic obstructive pulmonary disease) ([place])   . Depression   . Diabetes mellitus without complication ([place])   . Dyspnea    with exertion and bronchititis  . Fibromyalgia   . GERD (gastroesophageal reflux disease)   . History of hiatal hernia   . History of kidney stones   . Hyperlipidemia   . Neuromuscular disorder ([place])   . Pneumonia    hs, 01-19-15  . Sleep apnea    has Cpap have not been using     Family History  Problem Relation Age of Onset  . Diabetes Father   . Hyperlipidemia Father   . Hypertension Father   . Diabetes Paternal Aunt   . Hypertension Paternal Aunt   . Heart attack Paternal Uncle   . Emphysema Maternal Grandmother   . Heart failure Maternal Grandmother   . Cancer Maternal Grandfather   . Colon cancer Neg Hx      Social History   Social History  . Marital status: Married    Spouse name: N/A  . Number of children: N/A  . Years of education: N/A   Occupational History  . Not on file.   Social History Main Topics  . Smoking status: Current Every Day Smoker    Packs/day: 1.00  Years: 33.00    Types: Cigarettes  . Smokeless tobacco: Never Used     Comment: 05/17/2016 still smokes  . Alcohol use No  . Drug use: No  . Sexual activity: Not on file   Other Topics Concern  . Not on file   Social History Narrative  . No narrative on file     Allergies  Allergen Reactions  . Dulaglutide Other (See Comments)    Severe stomach pain  . Sulfur Nausea And Vomiting  . Metformin And Related Diarrhea     Outpatient Medications Prior to Visit  Medication Sig  Dispense Refill  . albuterol (PROVENTIL HFA;VENTOLIN HFA) 108 (90 Base) MCG/ACT inhaler Inhale 1-2 puffs into the lungs every 4 (four) hours as needed for wheezing or shortness of breath. 1 Inhaler 12  . albuterol (PROVENTIL) (2.5 MG/3ML) 0.083% nebulizer solution Inhale the contents of one vial in nebulizer every four to six hours as needed for cough or wheeze. 180 mL 1  . atorvastatin (LIPITOR) 40 MG tablet Take 40 mg by mouth daily.    Marland Kitchen BIOTIN PO Take by mouth daily.    . budesonide-formoterol (SYMBICORT) 160-4.5 MCG/ACT inhaler Inhale two puffs twice daily to prevent cough or wheeze.  Rinse, gargle, and spit after use. 1 Inhaler 5  . cetirizine (ZYRTEC) 10 MG tablet Can take one tablet by mouth once or twice daily if needed. 60 tablet 5  . cholecalciferol (VITAMIN D) 1000 units tablet Take 3,000 Units by mouth daily.    . clobetasol ointment (TEMOVATE) 5.09 % Apply 1 application topically 2 (two) times daily. 60 g 0  . Continuous Blood Gluc Sensor (FREESTYLE LIBRE SENSOR SYSTEM) MISC 3 sensors for 30 days(1 month supply) change every 10 days. 3 each 3  . Cyanocobalamin (VITAMIN B-12) 5000 MCG TBDP Take 5,000 mcg by mouth daily.     . Diphenhyd-Hydrocort-Nystatin (FIRST-DUKES MOUTHWASH) SUSP Use as directed 5-10 mLs in the mouth or throat 4 (four) times daily. 237 mL 1  . docusate sodium (COLACE) 100 MG capsule Take 1 capsule (100 mg total) by mouth 2 (two) times daily. 60 capsule 0  . fluticasone (FLONASE) 50 MCG/ACT nasal spray Use two sprays in each nostril once daily as directed. 16 g 5  . gabapentin (NEURONTIN) 400 MG capsule Take 3 capsules (1,200 mg total) by mouth 3 (three) times daily as needed (neuropathic pain). Max dose 3600 mg /24 hours 270 capsule 5  . Glucose Blood (BLOOD GLUCOSE TEST STRIPS) STRP 1 each by Other route 3 (three) times a day. Use as instructed    . insulin aspart (NOVOLOG FLEXPEN) 100 UNIT/ML FlexPen Inject 28 Units into the skin 3 (three) times daily with meals. 15  mL 6  . Insulin Glargine (LANTUS SOLOSTAR) 100 UNIT/ML Solostar Pen Inject 80 Units into the skin daily. And pen needles 5/day 10 pen PRN  . Insulin Pen Needle 32G X 4 MM MISC by Does not apply route.    Marland Kitchen ipratropium-albuterol (DUONEB) 0.5-2.5 (3) MG/3ML SOLN Take 3 mLs by nebulization every 2 (two) hours as needed (wheeze, SOB). 60 mL 3  . montelukast (SINGULAIR) 10 MG tablet Take 1 tablet (10 mg total) by mouth daily. 30 tablet 5  . nystatin (MYCOSTATIN/NYSTOP) powder Apply 1 g topically 2 (two) times daily as needed.    Marland Kitchen omeprazole (PRILOSEC) 40 MG capsule Take 1 capsule (40 mg total) by mouth every morning. 30 capsule 5  . ondansetron (ZOFRAN-ODT) 8 MG disintegrating tablet Dissolve 1 tablet (8  mg total) by mouth every 8 (eight) hours as needed for nausea.    Marland Kitchen oxyCODONE-acetaminophen (PERCOCET/ROXICET) 5-325 MG tablet Take 1 tablet by mouth at bedtime.     . ranitidine (ZANTAC) 300 MG tablet Take 1 tablet (300 mg total) by mouth at bedtime. 30 tablet 5  . rizatriptan (MAXALT-MLT) 10 MG disintegrating tablet Take 1 tablet (10 mg total) by mouth as needed for migraine. May repeat in 2 hours if needed 10 tablet 3  . tamsulosin (FLOMAX) 0.4 MG CAPS capsule Take 0.4 mg by mouth daily.     Marland Kitchen tiotropium (SPIRIVA HANDIHALER) 18 MCG inhalation capsule Inhale the contents of one capsule in handihaler once daily as directed. 30 capsule 5  . topiramate (TOPAMAX) 50 MG tablet One half tab by mouth daily for one week then one tab by mouth daily. 90 tablet 3  . valACYclovir (VALTREX) 500 MG tablet Take 1 tablet (500 mg total) by mouth at bedtime. 30 tablet 1  . beclomethasone (QVAR) 80 MCG/ACT inhaler Inhale 2 puffs into the lungs daily.    . IPRATROPIUM BROMIDE NA Place into the nose.    . Glycopyrrolate-Formoterol (BEVESPI AEROSPHERE) 9-4.8 MCG/ACT AERO Inhale 1 puff into the lungs daily. 1 Inhaler 11   Facility-Administered Medications Prior to Visit  Medication Dose Route Frequency Provider Last Rate  Last Dose  . 0.9 %  sodium chloride infusion  500 mL Intravenous Continuous Nandigam, Venia Minks, MD            Objective:   Physical Exam Vitals:   11/15/16 1029  BP: 132/86  Pulse: 86  SpO2: 95%  Weight: 273 lb (123.8 kg)  Height: 5' 4.75" (1.645 m)   Gen: Pleasant, overwt, in no distress,  normal affect  ENT: No lesions,  mouth clear,  oropharynx clear, no postnasal drip  Neck: No JVD, some expiratory stridor  Lungs: No use of accessory muscles, some referred upper airway noise, bibasilar expiratory wheeze  Cardiovascular: RRR, heart sounds normal, no murmur or gallops, no peripheral edema  Musculoskeletal: No deformities, no cyanosis or clubbing  Neuro: alert, non focal  Skin: Warm, no lesions or rashes      Assessment & Plan:  COPD mixed type (HCC) Multiple issues, cough, dyspnea, frequent exacerbations. I will simplify her regimen, add back BD's as indicated. Oximetry today was reassuring with ambulation. I will stop Spiriva, continue Symbicort and albuterol as needed for now. Obtain full pulmonary function testing. We will assess her upper airway response to the Symbicort alone.  Chronic cough Patient has chronic long-standing dry upper airway type cough. She also has episodes that are more consistent with a purulent cough, possible bronchitis or exacerbation of obstructive lung disease. Multiple factors contributing in addition to her COPD including reflux, allergic rhinitis. Consider influence powdered inhalers.  Continue her regimen for allergies, GERD as adjusted by Dr. Neldon Mc.   OSA on CPAP Poor compliance with CPAP at this time. She has a full face mask is difficult to tolerate. We will revisit this in the future, consider to an alternative mass, possibly nasal pillows.  Restrictive lung disease Chest x-ray reassuring. No evidence interstitial disease. I suspect that the most likely explanation is her weight. No other evidence to suggest autoimmune  disease.  Baltazar Apo, MD, PhD 11/15/2016, 11:14 AM [facility] [phone] or if no answer [phone]

## 2016-11-15 NOTE — Patient Instructions (Addendum)
We will perform a walking oximetry on room air today.  Please continue your Symbicort 2 puffs twice a day. Rinse and gargle after taking.  Stop Spiriva for now.  We will perform full pulmonary function testing  Stay on omeprazole, zantac, singulair, fluticasone nasal spray as you are taking them  Take albuterol 2 puffs up to every 4 hours if needed for shortness of breath.  Work on decreasing cigarettes to 15 a day by our next visit.  Follow with Dr Lamonte Sakai next available with Full PFT same day

## 2016-11-15 NOTE — Assessment & Plan Note (Signed)
Poor compliance with CPAP at this time. She has a full face mask is difficult to tolerate. We will revisit this in the future, consider to an alternative mass, possibly nasal pillows.

## 2016-11-15 NOTE — Telephone Encounter (Signed)
Called left patient VM stating that her PA was approved & that prescription was sent in to her pharmacy.

## 2016-11-15 NOTE — Assessment & Plan Note (Addendum)
Multiple issues, cough, dyspnea, frequent exacerbations. I will simplify her regimen, add back BD's as indicated. Oximetry today was reassuring with ambulation. I will stop Spiriva, continue Symbicort and albuterol as needed for now. Obtain full pulmonary function testing. We will assess her upper airway response to the Symbicort alone.

## 2016-11-15 NOTE — Assessment & Plan Note (Addendum)
Patient has chronic long-standing dry upper airway type cough. She also has episodes that are more consistent with a purulent cough, possible bronchitis or exacerbation of obstructive lung disease. Multiple factors contributing in addition to her COPD including reflux, allergic rhinitis. Consider influence powdered inhalers.  Continue her regimen for allergies, GERD as adjusted by Dr. Neldon Mc.

## 2016-11-15 NOTE — Addendum Note (Signed)
Addended by: Renato Shin on: 11/15/2016 02:24 PM   Modules accepted: Orders

## 2016-11-16 ENCOUNTER — Encounter: Payer: 59 | Admitting: Gastroenterology

## 2016-11-16 ENCOUNTER — Ambulatory Visit: Payer: Medicaid Other

## 2016-11-16 ENCOUNTER — Other Ambulatory Visit: Payer: Self-pay

## 2016-11-16 MED ORDER — FREESTYLE LIBRE READER DEVI: 1.0000 | Freq: Every day | 0 refills | Status: DC

## 2016-11-16 MED ORDER — FREESTYLE LIBRE SENSOR SYSTEM MISC: 3 refills | Status: DC

## 2016-11-18 ENCOUNTER — Telehealth: Payer: Self-pay | Admitting: Emergency Medicine

## 2016-11-18 MED ORDER — AZITHROMYCIN 250 MG PO TABS: ORAL_TABLET | ORAL | 0 refills | Status: DC

## 2016-11-18 MED ORDER — PREDNISONE 20 MG PO TABS: 20.0000 mg | ORAL_TABLET | Freq: Every day | ORAL | 0 refills | Status: DC

## 2016-11-18 NOTE — Telephone Encounter (Signed)
Called and spoke with pt and she is aware of CY recs.  meds have been sent to the pharmacy and pt is aware. Nothing further is needed.

## 2016-11-18 NOTE — Telephone Encounter (Signed)
Offer Zpak 250 mg, # 6, 2 today then one daily           Prednisone 20 mg, # 5, 1 daily  If no better next week, call back for Dr Lamonte Sakai

## 2016-11-18 NOTE — Telephone Encounter (Signed)
Called and spoke with pt. Pt reports of prod cough with tan mucus & chest tightness. Pt states she wakes during the night gasping for air. Pt states is having to sleep with her head elevated in order to get some relief.  Pt is requesting recommendations.   CY please advise, as RB is no available. Thanks.   Current Outpatient Prescriptions on File Prior to Visit  Medication Sig Dispense Refill  . albuterol (PROVENTIL HFA;VENTOLIN HFA) 108 (90 Base) MCG/ACT inhaler Inhale 1-2 puffs into the lungs every 4 (four) hours as needed for wheezing or shortness of breath. 1 Inhaler 12  . albuterol (PROVENTIL) (2.5 MG/3ML) 0.083% nebulizer solution Inhale the contents of one vial in nebulizer every four to six hours as needed for cough or wheeze. 180 mL 1  . atorvastatin (LIPITOR) 40 MG tablet Take 40 mg by mouth daily.    Marland Kitchen BIOTIN PO Take by mouth daily.    . budesonide-formoterol (SYMBICORT) 160-4.5 MCG/ACT inhaler Inhale two puffs twice daily to prevent cough or wheeze.  Rinse, gargle, and spit after use. 1 Inhaler 5  . cetirizine (ZYRTEC) 10 MG tablet Can take one tablet by mouth once or twice daily if needed. 60 tablet 5  . cholecalciferol (VITAMIN D) 1000 units tablet Take 3,000 Units by mouth daily.    . clobetasol ointment (TEMOVATE) 5.36 % Apply 1 application topically 2 (two) times daily. 60 g 0  . Continuous Blood Gluc Receiver (FREESTYLE LIBRE READER) DEVI 1 each by Does not apply route daily. Used to check blood sugars readings through out the day. 1 Device 0  . Continuous Blood Gluc Sensor (FREESTYLE LIBRE SENSOR SYSTEM) MISC 3 sensors for 30 days(1 month supply) change every 10 days. 3 each 3  . Cyanocobalamin (VITAMIN B-12) 5000 MCG TBDP Take 5,000 mcg by mouth daily.     . Diphenhyd-Hydrocort-Nystatin (FIRST-DUKES MOUTHWASH) SUSP Use as directed 5-10 mLs in the mouth or throat 4 (four) times daily. 237 mL 1  . docusate sodium (COLACE) 100 MG capsule Take 1 capsule (100 mg total) by mouth 2  (two) times daily. 60 capsule 0  . fluticasone (FLONASE) 50 MCG/ACT nasal spray Use two sprays in each nostril once daily as directed. 16 g 5  . gabapentin (NEURONTIN) 400 MG capsule Take 3 capsules (1,200 mg total) by mouth 3 (three) times daily as needed (neuropathic pain). Max dose 3600 mg /24 hours 270 capsule 5  . Glucose Blood (BLOOD GLUCOSE TEST STRIPS) STRP 1 each by Other route 3 (three) times a day. Use as instructed    . insulin aspart (NOVOLOG FLEXPEN) 100 UNIT/ML FlexPen Inject 28 Units into the skin 3 (three) times daily with meals. 15 mL 6  . Insulin Glargine (LANTUS SOLOSTAR) 100 UNIT/ML Solostar Pen Inject 80 Units into the skin daily. And pen needles 5/day 10 pen PRN  . Insulin Pen Needle 32G X 4 MM MISC by Does not apply route.    Marland Kitchen ipratropium-albuterol (DUONEB) 0.5-2.5 (3) MG/3ML SOLN Take 3 mLs by nebulization every 2 (two) hours as needed (wheeze, SOB). 60 mL 3  . linagliptin (TRADJENTA) 5 MG TABS tablet Take 1 tablet (5 mg total) by mouth daily. 30 tablet 11  . montelukast (SINGULAIR) 10 MG tablet Take 1 tablet (10 mg total) by mouth daily. 30 tablet 5  . nystatin (MYCOSTATIN/NYSTOP) powder Apply 1 g topically 2 (two) times daily as needed.    Marland Kitchen omeprazole (PRILOSEC) 40 MG capsule Take 1 capsule (40 mg total) by  mouth every morning. 30 capsule 5  . ondansetron (ZOFRAN-ODT) 8 MG disintegrating tablet Dissolve 1 tablet (8 mg total) by mouth every 8 (eight) hours as needed for nausea.    Marland Kitchen oxyCODONE-acetaminophen (PERCOCET/ROXICET) 5-325 MG tablet Take 1 tablet by mouth at bedtime.     . ranitidine (ZANTAC) 300 MG tablet Take 1 tablet (300 mg total) by mouth at bedtime. 30 tablet 5  . rizatriptan (MAXALT-MLT) 10 MG disintegrating tablet Take 1 tablet (10 mg total) by mouth as needed for migraine. May repeat in 2 hours if needed 10 tablet 3  . tamsulosin (FLOMAX) 0.4 MG CAPS capsule Take 0.4 mg by mouth daily.     Marland Kitchen tiotropium (SPIRIVA HANDIHALER) 18 MCG inhalation capsule Inhale  the contents of one capsule in handihaler once daily as directed. 30 capsule 5  . topiramate (TOPAMAX) 50 MG tablet One half tab by mouth daily for one week then one tab by mouth daily. 90 tablet 3  . valACYclovir (VALTREX) 500 MG tablet Take 1 tablet (500 mg total) by mouth at bedtime. 30 tablet 1   Current Facility-Administered Medications on File Prior to Visit  Medication Dose Route Frequency Provider Last Rate Last Dose  . 0.9 %  sodium chloride infusion  500 mL Intravenous Continuous Nandigam, Venia Minks, MD        Allergies  Allergen Reactions  . Dulaglutide Other (See Comments)    Severe stomach pain  . Sulfur Nausea And Vomiting  . Metformin And Related Diarrhea

## 2016-11-25 ENCOUNTER — Encounter: Payer: Self-pay | Admitting: Osteopathic Medicine

## 2016-11-26 ENCOUNTER — Ambulatory Visit: Payer: Self-pay | Admitting: Dietician

## 2016-12-07 ENCOUNTER — Ambulatory Visit: Payer: Medicaid Other | Admitting: Allergy and Immunology

## 2016-12-09 ENCOUNTER — Encounter: Payer: Self-pay | Admitting: Emergency Medicine

## 2016-12-09 ENCOUNTER — Ambulatory Visit (INDEPENDENT_AMBULATORY_CARE_PROVIDER_SITE_OTHER): Payer: Medicaid Other | Admitting: Emergency Medicine

## 2016-12-09 VITALS — BP 120/86 | HR 78 | Ht 65.0 in | Wt 270.0 lb

## 2016-12-09 DIAGNOSIS — Z87891 Personal history of nicotine dependence: Secondary | ICD-10-CM | POA: Diagnosis not present

## 2016-12-09 DIAGNOSIS — G4733 Obstructive sleep apnea (adult) (pediatric): Secondary | ICD-10-CM | POA: Diagnosis not present

## 2016-12-09 DIAGNOSIS — J984 Other disorders of lung: Secondary | ICD-10-CM | POA: Diagnosis not present

## 2016-12-09 DIAGNOSIS — J449 Chronic obstructive pulmonary disease, unspecified: Secondary | ICD-10-CM

## 2016-12-09 DIAGNOSIS — Z9989 Dependence on other enabling machines and devices: Secondary | ICD-10-CM

## 2016-12-09 LAB — PULMONARY FUNCTION TEST
DL/VA % pred: 74 %
DL/VA: 3.66 ml/min/mmHg/L
DLCO cor % pred: 49 %
DLCO cor: 12.62 ml/min/mmHg
DLCO unc % pred: 50 %
DLCO unc: 13.03 ml/min/mmHg
FEF 25-75 Post: 2.25 L/sec
FEF 25-75 Pre: 1.79 L/sec
FEF2575-%Change-Post: 26 %
FEF2575-%Pred-Post: 74 %
FEF2575-%Pred-Pre: 59 %
FEV1-%Change-Post: 6 %
FEV1-%Pred-Post: 68 %
FEV1-%Pred-Pre: 63 %
FEV1-Post: 2.05 L
FEV1-Pre: 1.92 L
FEV1FVC-%Change-Post: 0 %
FEV1FVC-%Pred-Pre: 99 %
FEV6-%Change-Post: 6 %
FEV6-%Pred-Post: 69 %
FEV6-%Pred-Pre: 64 %
FEV6-Post: 2.53 L
FEV6-Pre: 2.38 L
FEV6FVC-%Pred-Post: 102 %
FEV6FVC-%Pred-Pre: 102 %
FVC-%Change-Post: 7 %
FVC-%Pred-Post: 67 %
FVC-%Pred-Pre: 63 %
FVC-Post: 2.55 L
FVC-Pre: 2.38 L
Post FEV1/FVC ratio: 80 %
Post FEV6/FVC ratio: 100 %
Pre FEV1/FVC ratio: 81 %
Pre FEV6/FVC Ratio: 100 %
RV % pred: 91 %
RV: 1.59 L
TLC % pred: 77 %
TLC: 4.03 L

## 2016-12-09 NOTE — Assessment & Plan Note (Signed)
We talked today about smoking and smoking cessation. Try setting a goal of 17 cigarettes daily, no more.

## 2016-12-09 NOTE — Progress Notes (Signed)
PFT completed today 12/09/16.  

## 2016-12-09 NOTE — Progress Notes (Signed)
Subjective:    Patient ID: Tabitha Estrada, female    DOB: Sep 15, 1970, 46 y.o.   MRN: [id number]  HPI 46 year old woman, active tobacco use (30+ pack years), obesity, obstructive sleep apnea (unable to tolerate CPAP), DM, allergic rhinitis, GERD. She has been seen by Dr. Neldon Mc for suspected obstructive lung disease, allergic rhinitis. She was first told that she might have asthma 4-5 yrs ago. She has a longstanding cough, usually non-productive. She has 4-5 episode a year, hoarseness, wheeze, head and chest congestion. Some increased colored mucous. Usually gets treated with steroids and abx under these circumstances.   She is currently managed on Spiriva, Symbicort for last 2 weeks; formerly on bevespi + qvar. Also Singulair, fluticasone on her regimen. Her GERD has been treated with Zantac and omeprazole, seems to be controlled.   Spirometry was performed on 11/09/16 that I have personally reviewed. This shows evidence for mixed obstruction and restriction with an FEV1 of 1.98 L (65% predicted) and an FEV1 to FVC ratio of 78%. There was bronchodilator response.  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 Antimitochondrial antibody negative, immunoglobulins normal with the exception of a slightly decreased IgG  She tells me that her sleep study was 4 yrs ago, [facility]. She has a full face mask, has trouble with wearing it. She has gained 20 lbs over the last year.   ROV 12/09/16 -- Follow-up visit today for cough and dyspnea. She has a history of tobacco use, obesity, untreated obstructive sleep apnea, allergic rhinitis, GERD, suspected asthma. She underwent pulmonary function testing today that show principally restriction on spirometry with some possible superimposed mild obstruction, no significant bronchodilator response, restricted volumes and a decreased diffusion capacity that does not fully correct to normal when adjusted for alveolar volume. At her last visit we stopped  Spiriva, continued Symbicort.    Review of Systems  Constitutional: Negative for fever and unexpected weight change.  HENT: Negative for congestion, dental problem, ear pain, nosebleeds, postnasal drip, rhinorrhea, sinus pressure, sneezing, sore throat and trouble swallowing.   Eyes: Negative for redness and itching.  Respiratory: Positive for cough, shortness of breath and wheezing. Negative for chest tightness.   Cardiovascular: Negative for palpitations and leg swelling.  Gastrointestinal: Negative for nausea and vomiting.  Genitourinary: Negative for dysuria.  Musculoskeletal: Negative for joint swelling.  Skin: Negative for rash.  Neurological: Negative for headaches.  Hematological: Does not bruise/bleed easily.  Psychiatric/Behavioral: Negative for dysphoric mood. The patient is not nervous/anxious.    Past Medical History:  Diagnosis Date  . Allergy   . Anemia   . Anxiety   . Arthritis   . Asthma   . COPD (chronic obstructive pulmonary disease) ([place])   . Depression   . Diabetes mellitus without complication ([place])   . Dyspnea    with exertion and bronchititis  . Fibromyalgia   . GERD (gastroesophageal reflux disease)   . History of hiatal hernia   . History of kidney stones   . Hyperlipidemia   . Neuromuscular disorder ([place])   . Pneumonia    hs, 01-19-15  . Sleep apnea    has Cpap have not been using     Family History  Problem Relation Age of Onset  . Diabetes Father   . Hyperlipidemia Father   . Hypertension Father   . Diabetes Paternal Aunt   . Hypertension Paternal Aunt   . Heart attack Paternal Uncle   . Emphysema Maternal Grandmother   . Heart failure Maternal  Grandmother   . Cancer Maternal Grandfather   . Colon cancer Neg Hx      Social History   Social History  . Marital status: Married    Spouse name: N/A  . Number of children: N/A  . Years of education: N/A   Occupational History  . Not on file.   Social History Main Topics  .  Smoking status: Current Every Day Smoker    Packs/day: 1.00    Years: 33.00    Types: Cigarettes  . Smokeless tobacco: Never Used     Comment: 05/17/2016 still smokes  . Alcohol use No  . Drug use: No  . Sexual activity: Not on file   Other Topics Concern  . Not on file   Social History Narrative  . No narrative on file     Allergies  Allergen Reactions  . Dulaglutide Other (See Comments)    Severe stomach pain  . Sulfur Nausea And Vomiting  . Metformin And Related Diarrhea     Outpatient Medications Prior to Visit  Medication Sig Dispense Refill  . albuterol (PROVENTIL HFA;VENTOLIN HFA) 108 (90 Base) MCG/ACT inhaler Inhale 1-2 puffs into the lungs every 4 (four) hours as needed for wheezing or shortness of breath. 1 Inhaler 12  . albuterol (PROVENTIL) (2.5 MG/3ML) 0.083% nebulizer solution Inhale the contents of one vial in nebulizer every four to six hours as needed for cough or wheeze. 180 mL 1  . atorvastatin (LIPITOR) 40 MG tablet Take 40 mg by mouth daily.    Marland Kitchen BIOTIN PO Take by mouth daily.    . budesonide-formoterol (SYMBICORT) 160-4.5 MCG/ACT inhaler Inhale two puffs twice daily to prevent cough or wheeze.  Rinse, gargle, and spit after use. 1 Inhaler 5  . cetirizine (ZYRTEC) 10 MG tablet Can take one tablet by mouth once or twice daily if needed. 60 tablet 5  . cholecalciferol (VITAMIN D) 1000 units tablet Take 3,000 Units by mouth daily.    . clobetasol ointment (TEMOVATE) 4.40 % Apply 1 application topically 2 (two) times daily. 60 g 0  . Continuous Blood Gluc Receiver (FREESTYLE LIBRE READER) DEVI 1 each by Does not apply route daily. Used to check blood sugars readings through out the day. 1 Device 0  . Continuous Blood Gluc Sensor (FREESTYLE LIBRE SENSOR SYSTEM) MISC 3 sensors for 30 days(1 month supply) change every 10 days. 3 each 3  . Cyanocobalamin (VITAMIN B-12) 5000 MCG TBDP Take 5,000 mcg by mouth daily.     . Diphenhyd-Hydrocort-Nystatin (FIRST-DUKES  MOUTHWASH) SUSP Use as directed 5-10 mLs in the mouth or throat 4 (four) times daily. 237 mL 1  . docusate sodium (COLACE) 100 MG capsule Take 1 capsule (100 mg total) by mouth 2 (two) times daily. 60 capsule 0  . fluticasone (FLONASE) 50 MCG/ACT nasal spray Use two sprays in each nostril once daily as directed. 16 g 5  . gabapentin (NEURONTIN) 400 MG capsule Take 3 capsules (1,200 mg total) by mouth 3 (three) times daily as needed (neuropathic pain). Max dose 3600 mg /24 hours 270 capsule 5  . Glucose Blood (BLOOD GLUCOSE TEST STRIPS) STRP 1 each by Other route 3 (three) times a day. Use as instructed    . insulin aspart (NOVOLOG FLEXPEN) 100 UNIT/ML FlexPen Inject 28 Units into the skin 3 (three) times daily with meals. 15 mL 6  . Insulin Glargine (LANTUS SOLOSTAR) 100 UNIT/ML Solostar Pen Inject 80 Units into the skin daily. And pen needles 5/day 10 pen  PRN  . Insulin Pen Needle 32G X 4 MM MISC by Does not apply route.    Marland Kitchen ipratropium-albuterol (DUONEB) 0.5-2.5 (3) MG/3ML SOLN Take 3 mLs by nebulization every 2 (two) hours as needed (wheeze, SOB). 60 mL 3  . linagliptin (TRADJENTA) 5 MG TABS tablet Take 1 tablet (5 mg total) by mouth daily. 30 tablet 11  . montelukast (SINGULAIR) 10 MG tablet Take 1 tablet (10 mg total) by mouth daily. 30 tablet 5  . nystatin (MYCOSTATIN/NYSTOP) powder Apply 1 g topically 2 (two) times daily as needed.    Marland Kitchen omeprazole (PRILOSEC) 40 MG capsule Take 1 capsule (40 mg total) by mouth every morning. 30 capsule 5  . ondansetron (ZOFRAN-ODT) 8 MG disintegrating tablet Dissolve 1 tablet (8 mg total) by mouth every 8 (eight) hours as needed for nausea.    Marland Kitchen oxyCODONE-acetaminophen (PERCOCET/ROXICET) 5-325 MG tablet Take 1 tablet by mouth at bedtime.     . ranitidine (ZANTAC) 300 MG tablet Take 1 tablet (300 mg total) by mouth at bedtime. 30 tablet 5  . rizatriptan (MAXALT-MLT) 10 MG disintegrating tablet Take 1 tablet (10 mg total) by mouth as needed for migraine. May  repeat in 2 hours if needed 10 tablet 3  . tamsulosin (FLOMAX) 0.4 MG CAPS capsule Take 0.4 mg by mouth daily.     Marland Kitchen topiramate (TOPAMAX) 50 MG tablet One half tab by mouth daily for one week then one tab by mouth daily. 90 tablet 3  . valACYclovir (VALTREX) 500 MG tablet Take 1 tablet (500 mg total) by mouth at bedtime. 30 tablet 1  . azithromycin (ZITHROMAX) 250 MG tablet Take 2 tablets by mouth today then 1 daily until gone. 6 tablet 0  . predniSONE (DELTASONE) 20 MG tablet Take 1 tablet (20 mg total) by mouth daily with breakfast. 5 tablet 0  . tiotropium (SPIRIVA HANDIHALER) 18 MCG inhalation capsule Inhale the contents of one capsule in handihaler once daily as directed. 30 capsule 5   Facility-Administered Medications Prior to Visit  Medication Dose Route Frequency Provider Last Rate Last Dose  . 0.9 %  sodium chloride infusion  500 mL Intravenous Continuous Nandigam, Venia Minks, MD            Objective:   Physical Exam Vitals:   12/09/16 1502 12/09/16 1505  BP:  120/86  Pulse:  78  SpO2:  98%  Weight: 270 lb (122.5 kg)   Height: 5\' 5"  (1.651 m)    Gen: Pleasant, overwt, in no distress,  normal affect  ENT: No lesions,  mouth clear,  oropharynx clear, no postnasal drip  Neck: No JVD, no stridor  Lungs: No use of accessory muscles, no wheezing  Cardiovascular: RRR, heart sounds normal, no murmur or gallops, no peripheral edema  Musculoskeletal: No deformities, no cyanosis or clubbing  Neuro: alert, non focal  Skin: Warm, no lesions or rashes      Assessment & Plan:  OSA on CPAP We will look into changing your CPAP mask to nasal pillows to see if you tolerate this more easily  COPD mixed type (HCC) Please continue Symbicort 2 puffs twice a day. Remember to rinse and gargle after using this medication.  Restrictive lung disease Your pulmonary function testing shows some evidence for abnormal airflow and also for restricted breaths probably due to weight gain.  Please work on slowly and steadily increasing your exercise.   History of tobacco use We talked today about smoking and smoking cessation. Try setting a goal  of 17 cigarettes daily, no more.   Baltazar Apo, MD, PhD 12/09/2016, 4:43 PM [facility] [phone] or if no answer [phone]

## 2016-12-09 NOTE — Patient Instructions (Signed)
We will look into changing your CPAP mask to nasal pillows to see if you tolerate this more easily Please continue Symbicort 2 puffs twice a day. Remember to rinse and gargle after using this medication. Keep albuterol available to use 2 puffs if needed for shortness of breath Your pulmonary function testing shows some evidence for abnormal airflow and also for restricted breaths probably due to weight gain. Please work on slowly and steadily increasing your exercise.  We talked today about smoking and smoking cessation. Try setting a goal of 17 cigarettes daily, no more.  Follow with Dr Lamonte Sakai in 3 months or sooner if you have any problems.

## 2016-12-09 NOTE — Assessment & Plan Note (Signed)
Your pulmonary function testing shows some evidence for abnormal airflow and also for restricted breaths probably due to weight gain. Please work on slowly and steadily increasing your exercise.

## 2016-12-09 NOTE — Assessment & Plan Note (Signed)
Please continue Symbicort 2 puffs twice a day. Remember to rinse and gargle after using this medication.

## 2016-12-09 NOTE — Assessment & Plan Note (Signed)
We will look into changing your CPAP mask to nasal pillows to see if you tolerate this more easily

## 2016-12-13 ENCOUNTER — Telehealth: Payer: Self-pay | Admitting: Dietician

## 2016-12-13 ENCOUNTER — Encounter: Payer: Self-pay | Admitting: Endocrinology

## 2016-12-13 ENCOUNTER — Ambulatory Visit (INDEPENDENT_AMBULATORY_CARE_PROVIDER_SITE_OTHER): Payer: Medicaid Other | Admitting: Endocrinology

## 2016-12-13 VITALS — BP 124/72 | HR 78 | Wt 275.6 lb

## 2016-12-13 DIAGNOSIS — Z794 Long term (current) use of insulin: Secondary | ICD-10-CM | POA: Diagnosis not present

## 2016-12-13 DIAGNOSIS — E1165 Type 2 diabetes mellitus with hyperglycemia: Secondary | ICD-10-CM

## 2016-12-13 LAB — POCT GLYCOSYLATED HEMOGLOBIN (HGB A1C): Hemoglobin A1C: 9.2

## 2016-12-13 MED ORDER — INSULIN ASPART 100 UNIT/ML FLEXPEN: 40.0000 [IU] | PEN_INJECTOR | Freq: Three times a day (TID) | SUBCUTANEOUS | 6 refills | Status: DC

## 2016-12-13 MED ORDER — INSULIN GLARGINE 100 UNIT/ML SOLOSTAR PEN: 60.0000 [IU] | PEN_INJECTOR | Freq: Every day | SUBCUTANEOUS | 99 refills | Status: DC

## 2016-12-13 NOTE — Patient Instructions (Addendum)
check your blood sugar twice a day.  vary the time of day when you check, between before the 3 meals, and at bedtime.  also check if you have symptoms of your blood sugar being too high or too low.  please keep a record of the readings and bring it to your next appointment here (or you can bring the meter itself).  You can write it on any piece of paper.  please call us sooner if your blood sugar goes below 70, or if you have a lot of readings over 200.   Please change your insulins to those listed below. Please call or message [place] next week, to tell us how the blood sugar is doing.  Please come back for a follow-up appointment in 3 months.

## 2016-12-13 NOTE — Telephone Encounter (Signed)
Nutrition Note Patient called to reschedule an appointment.  New appointment is November 15 at 1:30. She also reports problems obtaining a [employer]. Antonieta Iba, RD, LDN

## 2016-12-13 NOTE — Progress Notes (Signed)
Subjective:    Patient ID: Tabitha Estrada, female    DOB: [birth year], 46 y.o.   MRN: [id number]  HPI Pt returns for f/u of diabetes mellitus: DM type: Insulin-requiring type 2 Dx'ed: 2878 Complications: polyneuropathy Therapy: insulin since 2010 GDM: never DKA: never Severe hypoglycemia: never Pancreatitis: never Pancreatic imaging: never Other: she takes multiple daily injections; she did not tolerate trulicity (abd pain), metformin (n/v/d), or victoza (vaginitis); she does not qualify for bariatric surgery, due to medicaid.  Interval history: no cbg record, but states cbg's vary from 94-200's.  It is in general higher as the day goes on.  pt states she feels well in general.  Past Medical History:  Diagnosis Date  . Allergy   . Anemia   . Anxiety   . Arthritis   . Asthma   . COPD (chronic obstructive pulmonary disease) ([place])   . Depression   . Diabetes mellitus without complication ([place])   . Dyspnea    with exertion and bronchititis  . Fibromyalgia   . GERD (gastroesophageal reflux disease)   . History of hiatal hernia   . History of kidney stones   . Hyperlipidemia   . Neuromuscular disorder ([place])   . Pneumonia    hs, 01-19-15  . Sleep apnea    has Cpap have not been using    Past Surgical History:  Procedure Laterality Date  . BACK SURGERY  2015,2016, 2018  . CARPAL TUNNEL RELEASE Right 2008  . CHOLECYSTECTOMY  1992  . KIDNEY STONE SURGERY  2015  . TONSILLECTOMY  1979  . TUBAL LIGATION  2005    Social History   Social History  . Marital status: Married    Spouse name: N/A  . Number of children: N/A  . Years of education: N/A   Occupational History  . Not on file.   Social History Main Topics  . Smoking status: Current Every Day Smoker    Packs/day: 1.00    Years: 33.00    Types: Cigarettes  . Smokeless tobacco: Never Used     Comment: 05/17/2016 still smokes  . Alcohol use No  . Drug use: No  . Sexual activity: Not on file   Other  Topics Concern  . Not on file   Social History Narrative  . No narrative on file    Current Outpatient Prescriptions on File Prior to Visit  Medication Sig Dispense Refill  . albuterol (PROVENTIL HFA;VENTOLIN HFA) 108 (90 Base) MCG/ACT inhaler Inhale 1-2 puffs into the lungs every 4 (four) hours as needed for wheezing or shortness of breath. 1 Inhaler 12  . albuterol (PROVENTIL) (2.5 MG/3ML) 0.083% nebulizer solution Inhale the contents of one vial in nebulizer every four to six hours as needed for cough or wheeze. 180 mL 1  . atorvastatin (LIPITOR) 40 MG tablet Take 40 mg by mouth daily.    Marland Kitchen BIOTIN PO Take by mouth daily.    . budesonide-formoterol (SYMBICORT) 160-4.5 MCG/ACT inhaler Inhale two puffs twice daily to prevent cough or wheeze.  Rinse, gargle, and spit after use. 1 Inhaler 5  . cetirizine (ZYRTEC) 10 MG tablet Can take one tablet by mouth once or twice daily if needed. 60 tablet 5  . cholecalciferol (VITAMIN D) 1000 units tablet Take 3,000 Units by mouth daily.    . clobetasol ointment (TEMOVATE) 6.76 % Apply 1 application topically 2 (two) times daily. 60 g 0  . Continuous Blood Gluc Receiver (FREESTYLE LIBRE READER) DEVI 1 each by Does  not apply route daily. Used to check blood sugars readings through out the day. 1 Device 0  . Continuous Blood Gluc Sensor (FREESTYLE LIBRE SENSOR SYSTEM) MISC 3 sensors for 30 days(1 month supply) change every 10 days. 3 each 3  . Cyanocobalamin (VITAMIN B-12) 5000 MCG TBDP Take 5,000 mcg by mouth daily.     . Diphenhyd-Hydrocort-Nystatin (FIRST-DUKES MOUTHWASH) SUSP Use as directed 5-10 mLs in the mouth or throat 4 (four) times daily. 237 mL 1  . docusate sodium (COLACE) 100 MG capsule Take 1 capsule (100 mg total) by mouth 2 (two) times daily. 60 capsule 0  . fluticasone (FLONASE) 50 MCG/ACT nasal spray Use two sprays in each nostril once daily as directed. 16 g 5  . gabapentin (NEURONTIN) 400 MG capsule Take 3 capsules (1,200 mg total) by  mouth 3 (three) times daily as needed (neuropathic pain). Max dose 3600 mg /24 hours 270 capsule 5  . Glucose Blood (BLOOD GLUCOSE TEST STRIPS) STRP 1 each by Other route 3 (three) times a day. Use as instructed    . Insulin Pen Needle 32G X 4 MM MISC by Does not apply route.    Marland Kitchen ipratropium-albuterol (DUONEB) 0.5-2.5 (3) MG/3ML SOLN Take 3 mLs by nebulization every 2 (two) hours as needed (wheeze, SOB). 60 mL 3  . linagliptin (TRADJENTA) 5 MG TABS tablet Take 1 tablet (5 mg total) by mouth daily. 30 tablet 11  . montelukast (SINGULAIR) 10 MG tablet Take 1 tablet (10 mg total) by mouth daily. 30 tablet 5  . nystatin (MYCOSTATIN/NYSTOP) powder Apply 1 g topically 2 (two) times daily as needed.    Marland Kitchen omeprazole (PRILOSEC) 40 MG capsule Take 1 capsule (40 mg total) by mouth every morning. 30 capsule 5  . ondansetron (ZOFRAN-ODT) 8 MG disintegrating tablet Dissolve 1 tablet (8 mg total) by mouth every 8 (eight) hours as needed for nausea.    Marland Kitchen oxyCODONE-acetaminophen (PERCOCET/ROXICET) 5-325 MG tablet Take 1 tablet by mouth at bedtime.     . ranitidine (ZANTAC) 300 MG tablet Take 1 tablet (300 mg total) by mouth at bedtime. 30 tablet 5  . rizatriptan (MAXALT-MLT) 10 MG disintegrating tablet Take 1 tablet (10 mg total) by mouth as needed for migraine. May repeat in 2 hours if needed 10 tablet 3  . tamsulosin (FLOMAX) 0.4 MG CAPS capsule Take 0.4 mg by mouth daily.     Marland Kitchen topiramate (TOPAMAX) 50 MG tablet One half tab by mouth daily for one week then one tab by mouth daily. 90 tablet 3  . valACYclovir (VALTREX) 500 MG tablet Take 1 tablet (500 mg total) by mouth at bedtime. 30 tablet 1   Current Facility-Administered Medications on File Prior to Visit  Medication Dose Route Frequency Provider Last Rate Last Dose  . 0.9 %  sodium chloride infusion  500 mL Intravenous Continuous Nandigam, Venia Minks, MD        Allergies  Allergen Reactions  . Dulaglutide Other (See Comments)    Severe stomach pain  .  Sulfur Nausea And Vomiting  . Metformin And Related Diarrhea    Family History  Problem Relation Age of Onset  . Diabetes Father   . Hyperlipidemia Father   . Hypertension Father   . Diabetes Paternal Aunt   . Hypertension Paternal Aunt   . Heart attack Paternal Uncle   . Emphysema Maternal Grandmother   . Heart failure Maternal Grandmother   . Cancer Maternal Grandfather   . Colon cancer Neg Hx  BP 124/72   Pulse 78   Wt 275 lb 9.6 oz (125 kg)   SpO2 95%   BMI 45.86 kg/m   Review of Systems She denies hypoglycemia.  She has gained weight.      Objective:   Physical Exam VITAL SIGNS:  See vs page GENERAL: no distress Pulses: foot pulses are intact bilaterally.   MSK: no deformity of the feet or ankles.  CV: 1+ bilat edema of the legs.  Skin:  no ulcer on the feet or ankles.  normal color and temp on the feet and ankles Neuro: sensation is intact to touch on the feet and ankles.     Lab Results  Component Value Date   HGBA1C 9.2 12/13/2016      Assessment & Plan:  Insulin-requiring type 2 DM with polyneuropathy: she needs increased rx  Patient Instructions  check your blood sugar twice a day.  vary the time of day when you check, between before the 3 meals, and at bedtime.  also check if you have symptoms of your blood sugar being too high or too low.  please keep a record of the readings and bring it to your next appointment here (or you can bring the meter itself).  You can write it on any piece of paper.  please call us sooner if your blood sugar goes below 70, or if you have a lot of readings over 200.   Please change your insulins to those listed below. Please call or message [place] next week, to tell us how the blood sugar is doing.  Please come back for a follow-up appointment in 3 months.

## 2016-12-20 ENCOUNTER — Other Ambulatory Visit: Payer: Self-pay | Admitting: Osteopathic Medicine

## 2016-12-24 ENCOUNTER — Ambulatory Visit: Payer: 59 | Admitting: Dietician

## 2016-12-27 ENCOUNTER — Other Ambulatory Visit: Payer: Self-pay | Admitting: Osteopathic Medicine

## 2017-01-03 ENCOUNTER — Other Ambulatory Visit: Payer: Self-pay | Admitting: Osteopathic Medicine

## 2017-01-03 ENCOUNTER — Encounter: Payer: Self-pay | Admitting: Osteopathic Medicine

## 2017-01-03 ENCOUNTER — Ambulatory Visit (INDEPENDENT_AMBULATORY_CARE_PROVIDER_SITE_OTHER): Payer: Medicaid Other | Admitting: Osteopathic Medicine

## 2017-01-03 VITALS — BP 122/78 | HR 87 | Temp 98.1°F | Ht 65.0 in | Wt 271.5 lb

## 2017-01-03 DIAGNOSIS — J449 Chronic obstructive pulmonary disease, unspecified: Secondary | ICD-10-CM

## 2017-01-03 DIAGNOSIS — E1165 Type 2 diabetes mellitus with hyperglycemia: Secondary | ICD-10-CM

## 2017-01-03 DIAGNOSIS — Z794 Long term (current) use of insulin: Secondary | ICD-10-CM

## 2017-01-03 DIAGNOSIS — Z0000 Encounter for general adult medical examination without abnormal findings: Secondary | ICD-10-CM | POA: Diagnosis not present

## 2017-01-03 DIAGNOSIS — B379 Candidiasis, unspecified: Secondary | ICD-10-CM | POA: Diagnosis not present

## 2017-01-03 MED ORDER — FLUCONAZOLE 150 MG PO TABS: 150.0000 mg | ORAL_TABLET | Freq: Once | ORAL | 1 refills | Status: DC

## 2017-01-03 NOTE — Patient Instructions (Signed)
Plan: Labs today  Mammogram needs scheduled  Consider Pap, will be due in 2 years  Pneumonia shot today

## 2017-01-03 NOTE — Progress Notes (Signed)
HPI: Tabitha Estrada is a 46 y.o. female with PMH  has a past medical history of Allergy, Anemia, Anxiety, Arthritis, Asthma, COPD (chronic obstructive pulmonary disease) ([place]), Depression, Diabetes mellitus without complication ([place]), Dyspnea, Fibromyalgia, GERD (gastroesophageal reflux disease), History of hiatal hernia, History of kidney stones, Hyperlipidemia, Neuromuscular disorder ([place]), Pneumonia, and Sleep apnea.  who presents to [facility] today, 01/03/17,  for chief complaint of:  Chief Complaint  Patient presents with  . Annual Exam      Patient here for annual physical / wellness exam.  See preventive care reviewed as below.  Recent labs reviewed in detail with the patient.   Additional concerns today include:  Would like to discuss strategies for weight loss. Has struggled since getting gon insulin. No calorie logging. No exercise. Dieting is a struggle.    Past medical, surgical, social and family history reviewed:  Patient Active Problem List   Diagnosis Date Noted  . Restrictive lung disease 11/15/2016  . Status migrainosus 10/01/2016  . IDA (iron deficiency anemia) 09/16/2016  . 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 Leukemoid reaction 05/17/2016  . Vitamin D deficiency 03/21/2016  . Chronic cough 12/24/2015  . Type 2 diabetes mellitus with hyperglycemia ([place]) 12/24/2015  . Pruritus 12/24/2015  . Urinary frequency 12/24/2015  . 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 Vitamin B12 deficiency 02/01/2014  . Left ureteral stone 12/08/2013  . Lumbar disc herniation 10/07/2013  . COPD mixed type ([place]) 05/08/2013  . 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 IBS (irritable bowel syndrome) 03/28/2011  . History of tobacco use 02/04/2011  . Hypertension 12/23/2010  . OSA on CPAP 04/23/2009    Past Surgical History:  Procedure Laterality  Date  . BACK SURGERY  2015,2016, 2018  . CARPAL TUNNEL RELEASE Right 2008  . CHOLECYSTECTOMY  1992  . KIDNEY STONE SURGERY  2015  . TONSILLECTOMY  1979  . TUBAL LIGATION  2005    Social History   Tobacco Use  . Smoking status: Current Every Day Smoker    Packs/day: 1.00    Years: 33.00    Pack years: 33.00    Types: Cigarettes  . Smokeless tobacco: Never Used  . Tobacco comment: 05/17/2016 still smokes  Substance Use Topics  . Alcohol use: No    Family History  Problem Relation Age of Onset  . Diabetes Father   . Hyperlipidemia Father   . Hypertension Father   . Diabetes Paternal Aunt   . Hypertension Paternal Aunt   . Heart attack Paternal Uncle   . Emphysema Maternal Grandmother   . Heart failure Maternal Grandmother   . Cancer Maternal Grandfather   . Colon cancer Neg Hx      Current medication list and allergy/intolerance information reviewed:    Current Outpatient Medications  Medication Sig Dispense Refill  . albuterol (PROVENTIL HFA;VENTOLIN HFA) 108 (90 Base) MCG/ACT inhaler Inhale 1-2 puffs into the lungs every 4 (four) hours as needed for wheezing or shortness of breath. 1 Inhaler 12  . albuterol (PROVENTIL) (2.5 MG/3ML) 0.083% nebulizer solution Inhale the contents of one vial in nebulizer every four to six hours as needed for cough or wheeze. 180 mL 1  . atorvastatin (LIPITOR) 40 MG tablet Take 40 mg by mouth daily.    Marland Kitchen BIOTIN PO Take by mouth daily.    . budesonide-formoterol (SYMBICORT) 160-4.5 MCG/ACT inhaler Inhale two puffs twice daily to  prevent cough or wheeze.  Rinse, gargle, and spit after use. 1 Inhaler 5  . cetirizine (ZYRTEC) 10 MG tablet Can take one tablet by mouth once or twice daily if needed. 60 tablet 5  . cholecalciferol (VITAMIN D) 1000 units tablet Take 3,000 Units by mouth daily.    . clobetasol ointment (TEMOVATE) 3.66 % Apply 1 application topically 2 (two) times daily. 60 g 0  . Continuous Blood Gluc Receiver (FREESTYLE LIBRE  READER) DEVI 1 each by Does not apply route daily. Used to check blood sugars readings through out the day. 1 Device 0  . Continuous Blood Gluc Sensor (FREESTYLE LIBRE SENSOR SYSTEM) MISC 3 sensors for 30 days(1 month supply) change every 10 days. 3 each 3  . Cyanocobalamin (VITAMIN B-12) 5000 MCG TBDP Take 5,000 mcg by mouth daily.     . Diphenhyd-Hydrocort-Nystatin (FIRST-DUKES MOUTHWASH) SUSP Use as directed 5-10 mLs in the mouth or throat 4 (four) times daily. 237 mL 1  . docusate sodium (COLACE) 100 MG capsule Take 1 capsule (100 mg total) by mouth 2 (two) times daily. 60 capsule 0  . fluticasone (FLONASE) 50 MCG/ACT nasal spray Use two sprays in each nostril once daily as directed. 16 g 5  . gabapentin (NEURONTIN) 400 MG capsule Take 3 capsules (1,200 mg total) by mouth 3 (three) times daily as needed (neuropathic pain). Max dose 3600 mg /24 hours 270 capsule 5  . Glucose Blood (BLOOD GLUCOSE TEST STRIPS) STRP 1 each by Other route 3 (three) times a day. Use as instructed    . insulin aspart (NOVOLOG FLEXPEN) 100 UNIT/ML FlexPen Inject 40 Units into the skin 3 (three) times daily with meals. 45 mL 6  . Insulin Glargine (LANTUS SOLOSTAR) 100 UNIT/ML Solostar Pen Inject 60 Units into the skin daily. And pen needles 5/day 10 pen PRN  . Insulin Pen Needle 32G X 4 MM MISC by Does not apply route.    Marland Kitchen ipratropium-albuterol (DUONEB) 0.5-2.5 (3) MG/3ML SOLN Take 3 mLs by nebulization every 2 (two) hours as needed (wheeze, SOB). 60 mL 3  . linagliptin (TRADJENTA) 5 MG TABS tablet Take 1 tablet (5 mg total) by mouth daily. 30 tablet 11  . montelukast (SINGULAIR) 10 MG tablet Take 1 tablet (10 mg total) by mouth daily. 30 tablet 5  . nystatin (MYCOSTATIN/NYSTOP) powder Apply 1 g topically 2 (two) times daily as needed.    Marland Kitchen omeprazole (PRILOSEC) 40 MG capsule Take 1 capsule (40 mg total) by mouth every morning. 30 capsule 5  . ondansetron (ZOFRAN-ODT) 8 MG disintegrating tablet Dissolve 1 tablet (8 mg  total) by mouth every 8 (eight) hours as needed for nausea.    Marland Kitchen oxyCODONE-acetaminophen (PERCOCET/ROXICET) 5-325 MG tablet Take 1 tablet by mouth at bedtime.     . ranitidine (ZANTAC) 300 MG tablet Take 1 tablet (300 mg total) by mouth at bedtime. 30 tablet 5  . rizatriptan (MAXALT-MLT) 10 MG disintegrating tablet Take 1 tablet (10 mg total) by mouth as needed for migraine. May repeat in 2 hours if needed 10 tablet 3  . tamsulosin (FLOMAX) 0.4 MG CAPS capsule Take 0.4 mg by mouth daily.     Marland Kitchen topiramate (TOPAMAX) 50 MG tablet One half tab by mouth daily for one week then one tab by mouth daily. 90 tablet 3  . valACYclovir (VALTREX) 500 MG tablet Take 1 tablet (500 mg total) by mouth at bedtime. 30 tablet 1   Current Facility-Administered Medications  Medication Dose Route Frequency Provider Last  Rate Last Dose  . 0.9 %  sodium chloride infusion  500 mL Intravenous Continuous Nandigam, Venia Minks, MD        Allergies  Allergen Reactions  . Dulaglutide Other (See Comments)    Severe stomach pain  . Sulfur Nausea And Vomiting  . Metformin And Related Diarrhea      Review of Systems:  Constitutional:  No  fever, no chills, No recent illness, +unintentional weight gain. No significant fatigue.   HEENT: No  headache, no vision change  Cardiac: No  chest pain, No  pressure, No palpitations  Respiratory:  +chornic stable shortness of breath. No  Cough  Gastrointestinal: No  abdominal pain, No  nausea, No  vomiting,  Musculoskeletal: No new myalgia/arthralgia  Skin: No  Rash  Endocrine: No cold intolerance,  No heat intolerance. No polyuria/polydipsia/polyphagia   Neurologic: No  weakness, No  dizziness,  Psychiatric: No  concerns with depression, No  concerns with anxiety, No sleep problems, No mood problems  Exam:  BP 122/78   Pulse 87   Temp 98.1 F (36.7 C)   Ht 5\' 5"  (1.651 m)   Wt 271 lb 8 oz (123.2 kg)   SpO2 98%   BMI 45.18 kg/m   Constitutional: VS see above.  General Appearance: alert, well-developed, well-nourished, NAD  Eyes: Normal lids and conjunctive, non-icteric sclera  Ears, Nose, Mouth, Throat: MMM, Normal external inspection ears/nares/mouth/lips/gums.   Neck: No masses, trachea midline. No thyroid enlargement. No tenderness/mass appreciated. No lymphadenopathy  Respiratory: Normal respiratory effort. no wheeze, no rhonchi, no rales  Cardiovascular: S1/S2 normal, no murmur, no rub/gallop auscultated. RRR. No lower extremity edema.   Gastrointestinal: Nontender, no masses. No hepatomegaly, no splenomegaly.  Musculoskeletal: Gait normal. No clubbing/cyanosis of digits.   Neurological: Normal balance/coordination. No tremor.   Skin: warm, dry, intact. No rash/ulcer. No concerning nevi or subq nodules on limited exam.    Psychiatric: Normal judgment/insight. Normal mood and affect. Oriented x3.     ASSESSMENT/PLAN:   Annual physical exam - Plan: CBC, COMPLETE METABOLIC PANEL WITH GFR, Lipid panel, TSH, VITAMIN D 25 Hydroxy (Vit-D Deficiency, Fractures), MM DIGITAL SCREENING BILATERAL  COPD mixed type ([place])  Type 2 diabetes mellitus with hyperglycemia, with long-term current use of insulin ([place])  Yeast infection refille diflucan     FEMALE PREVENTIVE CARE Updated 01/03/17   ANNUAL SCREENING/COUNSELING  Diet/Exercise - HEALTHY HABITS DISCUSSED TO DECREASE CV RISK Social History   Tobacco Use  Smoking Status Current Every Day Smoker  . Packs/day: 1.00 - cutting back 2 a day  . Years: 33.00  . Pack years: 33.00  . Types: Cigarettes  Smokeless Tobacco Never Used  Tobacco Comment   05/17/2016 still smokes   Social History   Substance and Sexual Activity  Alcohol Use No   Depression screen PHQ 2/9 10/28/2016  Decreased Interest 1  Down, Depressed, Hopeless 0  PHQ - 2 Score 1  Altered sleeping 2  Tired, decreased energy 2  Change in appetite 1  Feeling bad or failure about yourself  2  Trouble concentrating 0   Moving slowly or fidgety/restless 0  Suicidal thoughts 0  PHQ-9 Score 8    Domestic violence concerns - no  HTN SCREENING - SEE [place]  Sexually active in the past year - No  Need/want STI testing today? - no  Concerns about libido or pain with sex? - no  Plans for pregnancy? - none  INFECTIOUS DISEASE SCREENING  HIV - does  not need  GC/CT - does not need  HepC - DOB [birth year] - does not need  TB - does not need  DISEASE SCREENING  Lipid - needs  DM2 - does not need  Osteoporosis - women age 45+ - does not need  CANCER SCREENING  Cervical - does not need - 3 yrs ago normal   Breast - needs  Lung - does not need  Colon - does not need - no FH   ADULT VACCINATION  Influenza - annual vaccine recommended  Td - booster every 10 years   Zoster - Shingrix recommended 50+  PCV13 - was not indicated  PPSV23 - smoker  Immunization History  Administered Date(s) Administered  . Influenza, Seasonal, Injecte, Preservative Fre 02/09/2016  . Influenza,inj,Quad PF,6+ Mos 02/09/2016   OTHER  Fall - exercise and Vit D age 74+ - does not need      Patient Instructions  Plan: Labs today  Mammogram needs scheduled  Consider Pap, will be due in 2 years  Pneumonia shot today      Visit summary with medication list and pertinent instructions was printed for patient to review. All questions at time of visit were answered - patient instructed to contact office with any additional concerns. ER/RTC precautions were reviewed with the patient. Follow-up plan: Return in about 6 months (around 07/03/2017) for routine check-up, sooner if needed.     Please note: voice recognition software was used to produce this document, and typos may escape review. Please contact me for any needed clarifications.

## 2017-01-04 ENCOUNTER — Encounter: Payer: Self-pay | Admitting: Osteopathic Medicine

## 2017-01-04 ENCOUNTER — Other Ambulatory Visit: Payer: Self-pay | Admitting: Osteopathic Medicine

## 2017-01-04 LAB — TSH: TSH: 1.85 mIU/L

## 2017-01-04 LAB — COMPLETE METABOLIC PANEL WITH GFR
AG RATIO: 1.5 (calc) (ref 1.0–2.5)
ALT: 18 U/L (ref 6–29)
AST: 17 U/L (ref 10–35)
Albumin: 4 g/dL (ref 3.6–5.1)
Alkaline phosphatase (APISO): 93 U/L (ref 33–115)
BUN: 14 mg/dL (ref 7–25)
CALCIUM: 9.6 mg/dL (ref 8.6–10.2)
CO2: 26 mmol/L (ref 20–32)
CREATININE: 0.94 mg/dL (ref 0.50–1.10)
Chloride: 101 mmol/L (ref 98–110)
GFR, EST NON AFRICAN AMERICAN: 73 mL/min/{1.73_m2} (ref >=60)
GFR, Est African American: 85 mL/min/{1.73_m2} (ref >=60)
Globulin: 2.7 g/dL (calc) (ref 1.9–3.7)
Glucose, Bld: 171 mg/dL — ABNORMAL HIGH (ref 65–99)
POTASSIUM: 4.2 mmol/L (ref 3.5–5.3)
Sodium: 137 mmol/L (ref 135–146)
Total Bilirubin: 0.4 mg/dL (ref 0.2–1.2)
Total Protein: 6.7 g/dL (ref 6.1–8.1)

## 2017-01-04 LAB — VITAMIN D 25 HYDROXY (VIT D DEFICIENCY, FRACTURES): Vit D, 25-Hydroxy: 23 ng/mL — ABNORMAL LOW (ref 30–100)

## 2017-01-04 LAB — CBC
HEMATOCRIT: 40.9 % (ref 35.0–45.0)
HEMOGLOBIN: 13.5 g/dL (ref 11.7–15.5)
MCH: 28 pg (ref 27.0–33.0)
MCHC: 33 g/dL (ref 32.0–36.0)
MCV: 84.7 fL (ref 80.0–100.0)
MPV: 9.7 fL (ref 7.5–12.5)
Platelets: 414 10*3/uL — ABNORMAL HIGH (ref 140–400)
RBC: 4.83 10*6/uL (ref 3.80–5.10)
RDW: 18.5 % — ABNORMAL HIGH (ref 11.0–15.0)
WBC: 17.1 10*3/uL — AB (ref 3.8–10.8)

## 2017-01-04 LAB — LIPID PANEL
CHOL/HDL RATIO: 4.7 (calc) (ref <5.0)
Cholesterol: 156 mg/dL (ref <200)
HDL: 33 mg/dL — ABNORMAL LOW (ref >50)
LDL Cholesterol (Calc): 95 mg/dL (calc)
NON-HDL CHOLESTEROL (CALC): 123 mg/dL (ref <130)
Triglycerides: 179 mg/dL — ABNORMAL HIGH (ref <150)

## 2017-01-04 MED ORDER — ATORVASTATIN CALCIUM 80 MG PO TABS: 80.0000 mg | ORAL_TABLET | Freq: Every day | ORAL | 3 refills | Status: DC

## 2017-01-04 NOTE — Progress Notes (Signed)
Lab results

## 2017-01-13 ENCOUNTER — Encounter: Payer: Medicaid Other | Attending: Endocrinology | Admitting: Dietician

## 2017-01-13 ENCOUNTER — Encounter: Payer: Self-pay | Admitting: Dietician

## 2017-01-13 DIAGNOSIS — Z713 Dietary counseling and surveillance: Secondary | ICD-10-CM | POA: Diagnosis present

## 2017-01-13 DIAGNOSIS — Z794 Long term (current) use of insulin: Secondary | ICD-10-CM | POA: Diagnosis not present

## 2017-01-13 DIAGNOSIS — E1165 Type 2 diabetes mellitus with hyperglycemia: Secondary | ICD-10-CM | POA: Insufficient documentation

## 2017-01-13 NOTE — Patient Instructions (Signed)
Increase your non starchy vegetables Be mindful   Eat slowly, stop when you are satisfied Rethink your beverages. Consider spreading the carbohydrate throughout the day.  Avoid skipping meals.  Aim for 3 Carb Choices per meal (45 grams) +/- 1 either way  Aim for 0-1 Carbs per snack if hungry  Include protein in moderation with your meals and snacks Consider reading food labels for Total Carbohydrate and Fat Grams of foods Consider  increasing your activity level by armchair for 30 minutes daily as tolerated Consider checking BG at alternate times per day as directed by MD  Continue taking medication as directed by MD

## 2017-01-14 ENCOUNTER — Telehealth: Payer: Self-pay | Admitting: Dietician

## 2017-01-14 NOTE — Telephone Encounter (Signed)
Called patient related to how she can obtain the [employer].  She already has a prescription. It is available at [facility], [facility], [facility], [facility]. Call [place] [phone] for further questions. Consider using the new FreeStyle View app on your iphone. Antonieta Iba, RD, LDN, CDE

## 2017-01-14 NOTE — Progress Notes (Signed)
Diabetes Self-Management Education  Visit Type: First/Initial  Appt. Start Time: 1330 Appt. End Time: 1500  01/14/2017  Tabitha Estrada, identified by name and date of birth, is a 46 y.o. female with a diagnosis of Diabetes: Type 2.  Other history includes OSA- does not use C-pap routinely but is working on it., COPD, hyperlipidemia, depression, GERD, fibromyalgia, multiple back surgeries. She has a prescription for the [facility] but her pharmacy does not have it.   Labs 01/03/17:  Vitamin D 23, Cholesterol 156, Triglycerides 179, HDL 33, LDL 95  Medications include Novolog 40 units with each meal, 60 units lantus q am, tradjenta, linagliptin, vitamin B-12, vitamin D, 5000 units daily.  Patient lives with her husband and 2 "picky" kids ages 42 and 29.  She is on disability.  She can't afford the [employer].    ASSESSMENT  Height 5' 5"  (1.651 m), weight 274 lb (124.3 kg). Body mass index is 45.6 kg/m.  Diabetes Self-Management Education - 01/13/17 1352      Visit Information   Visit Type  First/Initial      Initial Visit   Diabetes Type  Type 2    Are you currently following a meal plan?  No    Are you taking your medications as prescribed?  Yes    Date Diagnosed  2007 insulin since 2010   insulin since 2010     Health Coping   How would you rate your overall health?  Poor      Psychosocial Assessment   Patient Belief/Attitude about Diabetes  Defeat/Burnout    Self-care barriers  None    Self-management support  Doctor's office    Other persons present  Patient    Patient Concerns  Nutrition/Meal planning;Glycemic Control;Weight Control    Special Needs  None    Preferred Learning Style  No preference indicated    Learning Readiness  Not Ready    How often do you need to have someone help you when you read instructions, pamphlets, or other written materials from your doctor or pharmacy?  1 - Never    What is the last grade level you completed in school?  2 years  college      Pre-Education Assessment   Patient understands the diabetes disease and treatment process.  Needs Review    Patient understands incorporating nutritional management into lifestyle.  Needs Review    Patient undertands incorporating physical activity into lifestyle.  Needs Review    Patient understands using medications safely.  Needs Review    Patient understands monitoring blood glucose, interpreting and using results  Needs Review    Patient understands prevention, detection, and treatment of acute complications.  Needs Review    Patient understands prevention, detection, and treatment of chronic complications.  Needs Review    Patient understands how to develop strategies to address psychosocial issues.  Needs Review    Patient understands how to develop strategies to promote health/change behavior.  Needs Review      Complications   Last HgB A1C per patient/outside source  9.2 % 01/03/17   01/03/17   How often do you check your blood sugar?  1-2 times/day    Fasting Blood glucose range (mg/dL)  70-129;130-179 120-150   120-150   Postprandial Blood glucose range (mg/dL)  >200 300-400   300-400   Number of hypoglycemic episodes per month  0    Number of hyperglycemic episodes per week  7    Can you tell when your  blood sugar is high?  No    What do you do if your blood sugar is high?  drinks more water    Have you had a dilated eye exam in the past 12 months?  Yes    Have you had a dental exam in the past 12 months?  No    Are you checking your feet?  Yes    How many days per week are you checking your feet?  4      Dietary Intake   Breakfast  sausage biscuit OR egg and cheese OR poptart    Snack (morning)  none    Lunch  sandwich or SKIPS    Snack (afternoon)  none    Dinner  pork chop or roast or chicken, mashed potatoes or mac and cheese    Snack (evening)  popcorn or chips    Beverage(s)  water, regular soda (1 cup daily),       Exercise   Exercise Type  ADL's       Patient Education   Previous Diabetes Education  Yes (please comment) [place]   Nutrition management   Role of diet in the treatment of diabetes and the relationship between the three main macronutrients and blood glucose level;Food label reading, portion sizes and measuring food.;Carbohydrate counting;Meal timing in regards to the patients' current diabetes medication.;Meal options for control of blood glucose level and chronic complications.;Information on hints to eating out and maintain blood glucose control.    Physical activity and exercise   Other (comment) patient is unable to exercise due to several back surgeries and pain.   patient is unable to exercise due to several back surgeries and pain.   Medications  Reviewed patients medication for diabetes, action, purpose, timing of dose and side effects.    Monitoring  Purpose and frequency of SMBG.    Acute complications  Taught treatment of hypoglycemia - the 15 rule.    Chronic complications  Relationship between chronic complications and blood glucose control;Dental care;Retinopathy and reason for yearly dilated eye exams    Psychosocial adjustment  Worked with patient to identify barriers to care and solutions;Identified and addressed patients feelings and concerns about diabetes;Role of stress on diabetes;Brainstormed with patient on coping mechanisms for social situations, getting support from significant others, dealing with feelings about diabetes    Personal strategies to promote health  Lifestyle issues that need to be addressed for better diabetes care      Individualized Goals (developed by patient)   Nutrition  General guidelines for healthy choices and portions discussed    Physical Activity  Not Applicable    Medications  take my medication as prescribed    Monitoring   test my blood glucose as discussed    Reducing Risk  examine blood glucose patterns    Health Coping   discuss diabetes with (comment) MD, CDE, RD   MD, CDE, RD     Post-Education Assessment   Patient understands the diabetes disease and treatment process.  Demonstrates understanding / competency    Patient understands incorporating nutritional management into lifestyle.  Demonstrates understanding / competency    Patient undertands incorporating physical activity into lifestyle.  Demonstrates understanding / competency    Patient understands using medications safely.  Demonstrates understanding / competency    Patient understands monitoring blood glucose, interpreting and using results  Demonstrates understanding / competency    Patient understands prevention, detection, and treatment of acute complications.  Demonstrates understanding / competency    Patient understands prevention, detection, and treatment of chronic complications.  Demonstrates understanding / competency    Patient understands how to develop strategies to address psychosocial issues.  Needs Review    Patient understands how to develop strategies to promote health/change behavior.  Needs Review      Outcomes   Expected Outcomes  Demonstrated interest in learning. Expect positive outcomes    Future DMSE  PRN    Program Status  Not Completed       Individualized Plan for Diabetes Self-Management Training:   Learning Objective:  Patient will have a greater understanding of diabetes self-management. Patient education plan is to attend individual and/or group sessions per assessed needs and concerns.   Plan:   Patient Instructions  Increase your non starchy vegetables Be mindful   Eat slowly, stop when you are satisfied Rethink your beverages. Consider spreading the carbohydrate throughout the day.  Avoid skipping meals.  Aim for 3 Carb Choices per meal (45 grams) +/- 1 either way  Aim for 0-1 Carbs per snack if hungry  Include protein in moderation with your meals and snacks Consider reading food labels for Total  Carbohydrate and Fat Grams of foods Consider  increasing your activity level by armchair for 30 minutes daily as tolerated Consider checking BG at alternate times per day as directed by MD  Continue taking medication as directed by MD       Expected Outcomes:  Demonstrated interest in learning. Expect positive outcomes  Education material provided: Living Well with Diabetes, Food label handouts, A1C conversion sheet, Meal plan card, My Plate and Snack sheet  If problems or questions, patient to contact team via:  Phone  Future DSME appointment: PRN

## 2017-01-17 ENCOUNTER — Other Ambulatory Visit: Payer: Self-pay | Admitting: Osteopathic Medicine

## 2017-01-27 ENCOUNTER — Ambulatory Visit: Payer: Medicaid Other

## 2017-01-27 ENCOUNTER — Ambulatory Visit (HOSPITAL_BASED_OUTPATIENT_CLINIC_OR_DEPARTMENT_OTHER): Payer: Medicaid Other | Admitting: Family

## 2017-01-27 ENCOUNTER — Encounter: Payer: Self-pay | Admitting: Family

## 2017-01-27 ENCOUNTER — Other Ambulatory Visit: Payer: Self-pay

## 2017-01-27 ENCOUNTER — Other Ambulatory Visit (HOSPITAL_BASED_OUTPATIENT_CLINIC_OR_DEPARTMENT_OTHER): Payer: Medicaid Other

## 2017-01-27 VITALS — BP 130/68 | HR 79 | Temp 98.4°F | Resp 16 | Wt 266.0 lb

## 2017-01-27 DIAGNOSIS — M549 Dorsalgia, unspecified: Secondary | ICD-10-CM | POA: Diagnosis not present

## 2017-01-27 DIAGNOSIS — D509 Iron deficiency anemia, unspecified: Secondary | ICD-10-CM

## 2017-01-27 DIAGNOSIS — Z720 Tobacco use: Secondary | ICD-10-CM

## 2017-01-27 DIAGNOSIS — D72823 Leukemoid reaction: Secondary | ICD-10-CM

## 2017-01-27 DIAGNOSIS — D72829 Elevated white blood cell count, unspecified: Secondary | ICD-10-CM

## 2017-01-27 DIAGNOSIS — D508 Other iron deficiency anemias: Secondary | ICD-10-CM

## 2017-01-27 LAB — CMP (CANCER CENTER ONLY)
ALBUMIN: 3.9 g/dL (ref 3.3–5.5)
ALK PHOS: 116 U/L — AB (ref 26–84)
ALT: 25 U/L (ref 10–47)
AST: 17 U/L (ref 11–38)
BILIRUBIN TOTAL: 0.5 mg/dL (ref 0.20–1.60)
BUN, Bld: 14 mg/dL (ref 7–22)
CALCIUM: 9.5 mg/dL (ref 8.0–10.3)
CO2: 27 mEq/L (ref 18–33)
Chloride: 102 mEq/L (ref 98–108)
Creat: 1.1 mg/dl (ref 0.6–1.2)
GLUCOSE: 292 mg/dL — AB (ref 73–118)
POTASSIUM: 4.3 meq/L (ref 3.3–4.7)
Sodium: 144 mEq/L (ref 128–145)
TOTAL PROTEIN: 7.2 g/dL (ref 6.4–8.1)

## 2017-01-27 LAB — MANUAL DIFFERENTIAL (CHCC SATELLITE)
ALC: 2.1 10*3/uL (ref 0.6–2.2)
ANC (CHCC HP manual diff): 17.1 10*3/uL — ABNORMAL HIGH (ref 1.5–6.7)
BASO: 1 % (ref 0–2)
EOS: 2 % (ref 0–7)
LYMPH: 10 % — ABNORMAL LOW (ref 14–48)
MONO: 4 % (ref 0–13)
PLT EST ~~LOC~~: ADEQUATE
RBC COMMENTS: NORMAL
SEG: 83 % — ABNORMAL HIGH (ref 40–75)

## 2017-01-27 LAB — CBC WITH DIFFERENTIAL (CANCER CENTER ONLY)
HEMATOCRIT: 42.2 % (ref 34.8–46.6)
HEMOGLOBIN: 13.2 g/dL (ref 11.6–15.9)
MCH: 28.5 pg (ref 26.0–34.0)
MCHC: 31.3 g/dL — ABNORMAL LOW (ref 32.0–36.0)
MCV: 91 fL (ref 81–101)
Platelets: 354 10*3/uL (ref 145–400)
RBC: 4.63 10*6/uL (ref 3.70–5.32)
RDW: 18.5 % — AB (ref 11.1–15.7)
WBC: 20.7 10*3/uL — AB (ref 3.9–10.0)

## 2017-01-27 NOTE — Progress Notes (Signed)
Hematology and Oncology Follow Up Visit  Tabitha Estrada [id number] 1970/03/09 46 y.o. 01/27/2017   Principle Diagnosis:  Reactive leukocytosis  Iron deficiency anemia   Current Therapy:   IV iron as indicated    Interim History:  Tabitha Estrada is here today for follow-up. She is doing well but has some mild intermittent fatigue.  She had kidney stones last month and had lithotripsy. She is feeling a little better and trying to hydrate well.  Her WBC count is 20.7, Hgb is 13.2, platelet count is 354.  She has had no fever, chills, n/v, cough, rash, chest pain, palpitations, abdominal pain or changes in bowel habits.  She has had some SOB with over exertion. She is still smoking < 1 ppd.  She has some swelling in her feet and ankles. She states that her PCP is aware and does not feel an intervention is needed at this time.  No episodes of bleeding, bruising or petechiae. No lymphadenopathy found on exam.  She states that she has only had 5 light cycles this year.  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  Her blood sugars are not well controlled. She is eating well and her weight is stable.   ECOG Performance Status: 1 - Symptomatic but completely ambulatory  Medications:  Allergies as of 01/27/2017      Reactions   Dulaglutide Other (See Comments)   Severe stomach pain   Sulfur Nausea And Vomiting   Metformin And Related Diarrhea      Medication List        Accurate as of 01/27/17  1:14 PM. Always use your most recent med list.          albuterol 108 (90 Base) MCG/ACT inhaler Commonly known as:  PROVENTIL HFA;VENTOLIN HFA Inhale 1-2 puffs into the lungs every 4 (four) hours as needed for wheezing or shortness of breath.   albuterol (2.5 MG/3ML) 0.083% nebulizer solution Commonly known as:  PROVENTIL Inhale the contents of one vial in nebulizer every four to six hours as needed for cough or wheeze.   atorvastatin 80 MG tablet Commonly known as:   LIPITOR Take 1 tablet (80 mg total) daily by mouth.   BIOTIN PO Take by mouth daily.   BLOOD GLUCOSE TEST STRIPS Strp 1 each by Other route 3 (three) times a day. Use as instructed   budesonide-formoterol 160-4.5 MCG/ACT inhaler Commonly known as:  SYMBICORT Inhale two puffs twice daily to prevent cough or wheeze.  Rinse, gargle, and spit after use.   cetirizine 10 MG tablet Commonly known as:  ZYRTEC Can take one tablet by mouth once or twice daily if needed.   cholecalciferol 1000 units tablet Commonly known as:  VITAMIN D Take 5,000 Units daily by mouth.   clobetasol ointment 0.05 % Commonly known as:  TEMOVATE Apply 1 application topically 2 (two) times daily.   docusate sodium 100 MG capsule Commonly known as:  COLACE Take 1 capsule (100 mg total) by mouth 2 (two) times daily.   FIRST-DUKES MOUTHWASH Susp Use as directed 5-10 mLs in the mouth or throat 4 (four) times daily.   fluticasone 50 MCG/ACT nasal spray Commonly known as:  FLONASE Use two sprays in each nostril once daily as directed.   FREESTYLE LIBRE READER Devi 1 each by Does not apply route daily. Used to check blood sugars readings through out the day.   FREESTYLE LIBRE SENSOR SYSTEM Misc 3 sensors for 30 days(1 month supply) change every 10  days.   gabapentin 400 MG capsule Commonly known as:  NEURONTIN Take 3 capsules (1,200 mg total) by mouth 3 (three) times daily as needed (neuropathic pain). Max dose 3600 mg /24 hours   insulin aspart 100 UNIT/ML FlexPen Commonly known as:  NOVOLOG FLEXPEN Inject 40 Units into the skin 3 (three) times daily with meals.   Insulin Glargine 100 UNIT/ML Solostar Pen Commonly known as:  LANTUS SOLOSTAR Inject 60 Units into the skin daily. And pen needles 5/day   Insulin Pen Needle 32G X 4 MM Misc by Does not apply route.   ipratropium-albuterol 0.5-2.5 (3) MG/3ML Soln Commonly known as:  DUONEB Take 3 mLs by nebulization every 2 (two) hours as needed  (wheeze, SOB).   linagliptin 5 MG Tabs tablet Commonly known as:  TRADJENTA Take 1 tablet (5 mg total) by mouth daily.   montelukast 10 MG tablet Commonly known as:  SINGULAIR Take 1 tablet (10 mg total) by mouth daily.   nystatin powder Commonly known as:  MYCOSTATIN/NYSTOP Apply 1 g topically 2 (two) times daily as needed.   omeprazole 40 MG capsule Commonly known as:  PRILOSEC Take 1 capsule (40 mg total) by mouth every morning.   ondansetron 8 MG disintegrating tablet Commonly known as:  ZOFRAN-ODT Dissolve 1 tablet (8 mg total) by mouth every 8 (eight) hours as needed for nausea.   oxyCODONE-acetaminophen 5-325 MG tablet Commonly known as:  PERCOCET/ROXICET Take 1 tablet by mouth at bedtime.   ranitidine 300 MG tablet Commonly known as:  ZANTAC Take 1 tablet (300 mg total) by mouth at bedtime.   rizatriptan 10 MG disintegrating tablet Commonly known as:  MAXALT-MLT Take 1 tablet (10 mg total) by mouth as needed for migraine. May repeat in 2 hours if needed   tamsulosin 0.4 MG Caps capsule Commonly known as:  FLOMAX Take 0.4 mg by mouth daily.   topiramate 50 MG tablet Commonly known as:  TOPAMAX One half tab by mouth daily for one week then one tab by mouth daily.   valACYclovir 500 MG tablet Commonly known as:  VALTREX Take 1 tablet (500 mg total) by mouth at bedtime.   Vitamin B-12 5000 MCG Tbdp Take 5,000 mcg by mouth daily.       Allergies:  Allergies  Allergen Reactions  . Dulaglutide Other (See Comments)    Severe stomach pain  . Sulfur Nausea And Vomiting  . Metformin And Related Diarrhea    Past Medical History, Surgical history, Social history, and Family History were reviewed and updated.  Review of Systems: All other 10 point review of systems is negative.   Physical Exam:  vitals were not taken for this visit.   Wt Readings from Last 3 Encounters:  01/13/17 274 lb (124.3 kg)  01/03/17 271 lb 8 oz (123.2 kg)  12/13/16 275 lb 9.6  oz (125 kg)    Ocular: Sclerae unicteric, pupils equal, round and reactive to light Ear-nose-throat: Oropharynx clear, dentition fair Lymphatic: No cervical, supraclavicular or axillary adenopathy Lungs no rales or rhonchi, good excursion bilaterally Heart regular rate and rhythm, no murmur appreciated Abd soft, nontender, positive bowel sounds, no liver or spleen tip palpated on exam, no fluid wave  MSK no focal spinal tenderness, no joint edema Neuro: non-focal, well-oriented, appropriate affect Breasts: Deferred   Lab Results  Component Value Date   WBC 17.1 (H) 01/03/2017   HGB 13.5 01/03/2017   HCT 40.9 01/03/2017   MCV 84.7 01/03/2017   PLT 414 (H) 01/03/2017  Lab Results  Component Value Date   FERRITIN 29 09/16/2016   IRON 38 (L) 09/16/2016   TIBC 311 09/16/2016   UIBC 273 09/16/2016   IRONPCTSAT 12 (L) 09/16/2016   Lab Results  Component Value Date   RBC 4.83 01/03/2017   No results found for: Nils Pyle, [facility] Lab Results  Component Value Date   IGGSERUM 675 (L) 11/10/2016   IGMSERUM 86 11/10/2016   No results found for: Odetta Pink, [facility]   Chemistry      Component Value Date/Time   NA 137 01/03/2017 1157   NA 138 09/16/2016 1105   K 4.2 01/03/2017 1157   K 4.2 09/16/2016 1105   CL 101 01/03/2017 1157   CL 97 05/17/2016 1350   CO2 26 01/03/2017 1157   CO2 26 09/16/2016 1105   BUN 14 01/03/2017 1157   BUN 11.9 09/16/2016 1105   CREATININE 0.94 01/03/2017 1157   CREATININE 0.8 09/16/2016 1105      Component Value Date/Time   CALCIUM 9.6 01/03/2017 1157   CALCIUM 9.0 09/16/2016 1105   ALKPHOS 136 09/16/2016 1105   AST 17 01/03/2017 1157   AST 8 09/16/2016 1105   ALT 18 01/03/2017 1157   ALT 12 09/16/2016 1105   BILITOT 0.4 01/03/2017 1157   BILITOT 0.32 09/16/2016 1105      Impression and Plan: Ms. Giannetti is a very pleasant 46 yo caucasian female with reactive  leukocytosis. She is a smoker and has chronic back pain. She also has iron deficiency. She has had some mild fatigue at times.  We will see what her iron studies show and bring her back in later this month for follow-up.  We will go ahead and plan to see her back again in another 6 months for follow-up and lab.  She will contact our office with any questions or concerns. We can certainly see him sooner if need be.   Eliezer Bottom, NP 11/29/20181:14 PM

## 2017-01-28 LAB — IRON AND TIBC
%SAT: 18 % — AB (ref 21–57)
IRON: 51 ug/dL (ref 41–142)
TIBC: 283 ug/dL (ref 236–444)
UIBC: 232 ug/dL (ref 120–384)

## 2017-01-28 LAB — FERRITIN: Ferritin: 77 ng/ml (ref 9–269)

## 2017-02-03 ENCOUNTER — Ambulatory Visit (INDEPENDENT_AMBULATORY_CARE_PROVIDER_SITE_OTHER): Payer: Medicaid Other

## 2017-02-03 ENCOUNTER — Encounter: Payer: Self-pay | Admitting: Osteopathic Medicine

## 2017-02-03 ENCOUNTER — Ambulatory Visit: Payer: Medicaid Other | Admitting: Osteopathic Medicine

## 2017-02-03 DIAGNOSIS — R05 Cough: Secondary | ICD-10-CM | POA: Diagnosis not present

## 2017-02-03 DIAGNOSIS — J4541 Moderate persistent asthma with (acute) exacerbation: Secondary | ICD-10-CM | POA: Diagnosis not present

## 2017-02-03 DIAGNOSIS — R0602 Shortness of breath: Secondary | ICD-10-CM

## 2017-02-03 DIAGNOSIS — R079 Chest pain, unspecified: Secondary | ICD-10-CM

## 2017-02-03 MED ORDER — PREDNISONE 20 MG PO TABS: 20.0000 mg | ORAL_TABLET | Freq: Two times a day (BID) | ORAL | 0 refills | Status: DC

## 2017-02-03 MED ORDER — GUAIFENESIN-CODEINE 100-10 MG/5ML PO SYRP: 5.0000 mL | ORAL_SOLUTION | Freq: Four times a day (QID) | ORAL | 0 refills | Status: DC | PRN

## 2017-02-03 MED ORDER — IPRATROPIUM-ALBUTEROL 0.5-2.5 (3) MG/3ML IN SOLN: 3.0000 mL | RESPIRATORY_TRACT | 3 refills | Status: DC | PRN

## 2017-02-03 MED ORDER — AZITHROMYCIN 250 MG PO TABS: ORAL_TABLET | ORAL | 0 refills | Status: DC

## 2017-02-03 NOTE — Progress Notes (Signed)
HPI: Tabitha Estrada is a 46 y.o. female who  has a past medical history of Allergy, Anemia, Anxiety, Arthritis, Asthma, COPD (chronic obstructive pulmonary disease) ([place]), Depression, Diabetes mellitus without complication ([place]), Dyspnea, Fibromyalgia, GERD (gastroesophageal reflux disease), History of hiatal hernia, History of kidney stones, Hyperlipidemia, Neuromuscular disorder ([place]), Pneumonia, and Sleep apnea.  she presents to [facility] today, 02/03/17,  for chief complaint of:  Chief Complaint  Patient presents with  . Cough  . Headache  . Sore Throat    History of next asthma/COPD. Over the past 2 weeks has noted initial URI type symptoms now progressing into cough. Has used rescue inhaler a few times yesterday, not using nebulizer machine. Compliant with maintenance inhaler as below. No fever/chills or severe shortness of breath, though she reports some general malaise and some shortness of breath with bad coughing. Cough is very bothersome to her.   Past medical, surgical, social and family history reviewed:  Patient Active Problem List   Diagnosis Date Noted  . Restrictive lung disease 11/15/2016  . Status migrainosus 10/01/2016  . IDA (iron deficiency anemia) 09/16/2016  . 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 Leukemoid reaction 05/17/2016  . Vitamin D deficiency 03/21/2016  . Chronic cough 12/24/2015  . Type 2 diabetes mellitus with hyperglycemia ([place]) 12/24/2015  . Pruritus 12/24/2015  . Urinary frequency 12/24/2015  . 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 Vitamin B12 deficiency 02/01/2014  . Left ureteral stone 12/08/2013  . Lumbar disc herniation 10/07/2013  . COPD mixed type ([place]) 05/08/2013  . 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 IBS (irritable bowel syndrome) 03/28/2011  . History of tobacco use 02/04/2011  . Hypertension  12/23/2010  . OSA on CPAP 04/23/2009    Past Surgical History:  Procedure Laterality Date  . BACK SURGERY  2015,2016, 2018  . CARPAL TUNNEL RELEASE Right 2008  . CHOLECYSTECTOMY  1992  . KIDNEY STONE SURGERY  2015  . TONSILLECTOMY  1979  . TUBAL LIGATION  2005    Social History   Tobacco Use  . Smoking status: Current Every Day Smoker    Packs/day: 1.00    Years: 33.00    Pack years: 33.00    Types: Cigarettes  . Smokeless tobacco: Never Used  . Tobacco comment: 05/17/2016 still smokes  Substance Use Topics  . Alcohol use: No    Family History  Problem Relation Age of Onset  . Diabetes Father   . Hyperlipidemia Father   . Hypertension Father   . Diabetes Paternal Aunt   . Hypertension Paternal Aunt   . Heart attack Paternal Uncle   . Emphysema Maternal Grandmother   . Heart failure Maternal Grandmother   . Cancer Maternal Grandfather   . Colon cancer Neg Hx      Current medication list and allergy/intolerance information reviewed:    Current Outpatient Medications  Medication Sig Dispense Refill  . albuterol (PROVENTIL HFA;VENTOLIN HFA) 108 (90 Base) MCG/ACT inhaler Inhale 1-2 puffs into the lungs every 4 (four) hours as needed for wheezing or shortness of breath. 1 Inhaler 12  . albuterol (PROVENTIL) (2.5 MG/3ML) 0.083% nebulizer solution Inhale the contents of one vial in nebulizer every four to six hours as needed for cough or wheeze. 180 mL 1  . atorvastatin (LIPITOR) 80 MG tablet Take 1 tablet (80 mg total) daily by mouth. 90 tablet 3  . BIOTIN PO Take by mouth  daily.    . budesonide-formoterol (SYMBICORT) 160-4.5 MCG/ACT inhaler Inhale two puffs twice daily to prevent cough or wheeze.  Rinse, gargle, and spit after use. 1 Inhaler 5  . cetirizine (ZYRTEC) 10 MG tablet Can take one tablet by mouth once or twice daily if needed. 60 tablet 5  . cholecalciferol (VITAMIN D) 1000 units tablet Take 5,000 Units daily by mouth.     . clobetasol ointment (TEMOVATE)  7.98 % Apply 1 application topically 2 (two) times daily. 60 g 0  . Continuous Blood Gluc Receiver (FREESTYLE LIBRE READER) DEVI 1 each by Does not apply route daily. Used to check blood sugars readings through out the day. 1 Device 0  . Continuous Blood Gluc Sensor (FREESTYLE LIBRE SENSOR SYSTEM) MISC 3 sensors for 30 days(1 month supply) change every 10 days. 3 each 3  . Cyanocobalamin (VITAMIN B-12) 5000 MCG TBDP Take 5,000 mcg by mouth daily.     . Diphenhyd-Hydrocort-Nystatin (FIRST-DUKES MOUTHWASH) SUSP Use as directed 5-10 mLs in the mouth or throat 4 (four) times daily. 237 mL 1  . docusate sodium (COLACE) 100 MG capsule Take 1 capsule (100 mg total) by mouth 2 (two) times daily. 60 capsule 0  . fluconazole (DIFLUCAN) 150 MG tablet Take 1 tablet (150 mg total) once for 1 dose by mouth. As needed for yeast infectin. Repeat if needed 48 hours    . fluticasone (FLONASE) 50 MCG/ACT nasal spray Use two sprays in each nostril once daily as directed. 16 g 5  . gabapentin (NEURONTIN) 400 MG capsule Take 3 capsules (1,200 mg total) by mouth 3 (three) times daily as needed (neuropathic pain). Max dose 3600 mg /24 hours 270 capsule 0  . Glucose Blood (BLOOD GLUCOSE TEST STRIPS) STRP 1 each by Other route 3 (three) times a day. Use as instructed    . insulin aspart (NOVOLOG FLEXPEN) 100 UNIT/ML FlexPen Inject 40 Units into the skin 3 (three) times daily with meals. 45 mL 6  . Insulin Glargine (LANTUS SOLOSTAR) 100 UNIT/ML Solostar Pen Inject 60 Units into the skin daily. And pen needles 5/day 10 pen PRN  . Insulin Pen Needle 32G X 4 MM MISC by Does not apply route.    Marland Kitchen ipratropium-albuterol (DUONEB) 0.5-2.5 (3) MG/3ML SOLN Take 3 mLs by nebulization every 2 (two) hours as needed (wheeze, SOB). 60 mL 3  . linagliptin (TRADJENTA) 5 MG TABS tablet Take 1 tablet (5 mg total) by mouth daily. 30 tablet 11  . montelukast (SINGULAIR) 10 MG tablet Take 1 tablet (10 mg total) by mouth daily. 30 tablet 5  .  nystatin (MYCOSTATIN/NYSTOP) powder Apply 1 g topically 2 (two) times daily as needed.    Marland Kitchen omeprazole (PRILOSEC) 40 MG capsule Take 1 capsule (40 mg total) by mouth every morning. 30 capsule 5  . ondansetron (ZOFRAN-ODT) 8 MG disintegrating tablet Dissolve 1 tablet (8 mg total) by mouth every 8 (eight) hours as needed for nausea.    Marland Kitchen oxyCODONE-acetaminophen (PERCOCET/ROXICET) 5-325 MG tablet Take 1 tablet by mouth at bedtime.     . ranitidine (ZANTAC) 300 MG tablet Take 1 tablet (300 mg total) by mouth at bedtime. 30 tablet 5  . rizatriptan (MAXALT-MLT) 10 MG disintegrating tablet Take 1 tablet (10 mg total) by mouth as needed for migraine. May repeat in 2 hours if needed 10 tablet 3  . tamsulosin (FLOMAX) 0.4 MG CAPS capsule Take 0.4 mg by mouth daily.     Marland Kitchen topiramate (TOPAMAX) 50 MG tablet One half  tab by mouth daily for one week then one tab by mouth daily. 90 tablet 3  . valACYclovir (VALTREX) 500 MG tablet Take 1 tablet (500 mg total) by mouth at bedtime. 30 tablet 1   Current Facility-Administered Medications  Medication Dose Route Frequency Provider Last Rate Last Dose  . 0.9 %  sodium chloride infusion  500 mL Intravenous Continuous Nandigam, Venia Minks, MD        Allergies  Allergen Reactions  . Dulaglutide Other (See Comments)    Severe stomach pain  . Sulfur Nausea And Vomiting  . Metformin And Related Diarrhea      Review of Systems:  Constitutional:  No  fever, no chills, +recent illness, No unintentional weight changes. +significant fatigue.   HEENT: +headache, no vision change, no hearing change, +sore throat, +  sinus pressure  Cardiac: No  chest pain, No  pressure, No palpitations, No  Orthopnea  Respiratory:  +shortness of breath. +Cough  Gastrointestinal: No  abdominal pain, No  nausea, No  vomiting,  No  blood in stool, No  diarrhea  Musculoskeletal: +genrealized myalgia/arthralgia  Skin: No  Rash  Neurologic: No  weakness, No  dizziness,  Exam:  BP  (!) 154/70   Pulse 83   Temp 98.1 F (36.7 C)   Ht 5\' 5"  (1.651 m)   Wt 271 lb (122.9 kg)   SpO2 98%   BMI 45.10 kg/m   Constitutional: VS see above. General Appearance: alert, well-developed, well-nourished, NAD  Eyes: Normal lids and conjunctive, non-icteric sclera  Ears, Nose, Mouth, Throat: MMM, Normal external inspection ears/nares/mouth/lips/gums. TM normal bilaterally. Pharynx/tonsils no erythema, no exudate. Nasal mucosa normal.   Neck: No masses, trachea midline. No thyroid enlargement. No tenderness/mass appreciated. No lymphadenopathy  Respiratory: Normal respiratory effort. + Diffuse bilateral wheeze, no rhonchi, no rales  Cardiovascular: S1/S2 normal, no murmur, no rub/gallop auscultated. RRR. No lower extremity edema.   Musculoskeletal: Gait normal.   Neurological: Normal balance/coordination. No tremor  Skin: warm, dry  Psychiatric: Normal judgment/insight. Normal mood and affect. Oriented x3.    Chest x-ray on personal review no concerns for infiltrated or pulmonary effusion. Chronic bronchitic changes. Await radiology over read but I do not see pneumonia  No results found.   ASSESSMENT/PLAN:   Moderate persistent asthma with acute exacerbation - Plan: ipratropium-albuterol (DUONEB) 0.5-2.5 (3) MG/3ML SOLN, DG Chest 2 View    Patient Instructions  Plan:  Continue maintenance inhalers as usual  I sent nebulizer medicine to use every 4 hours or so while you are feeling sick  I sent cough medicine  I sent antibiotics and steroids, this is going to be the most important medicine to get you feeling better  Left plan to do a chest x-ray today, continue with plan as above as long as there is no severe pneumonia - we will call you if anything changes based on the chest x-ray  For aches/pains, Tylenol 1000 mg every 6 hours, ibuprofen 800 mg every 6 hours is okay to take    Visit summary with medication list and pertinent instructions was printed for  patient to review. All questions at time of visit were answered - patient instructed to contact office with any additional concerns. ER/RTC precautions were reviewed with the patient.   Follow-up plan: Return if symptoms worsen or fail to improve.    Please note: voice recognition software was used to produce this document, and typos may escape review. Please contact Dr. Sheppard Coil for any needed clarifications.

## 2017-02-03 NOTE — Patient Instructions (Addendum)
Plan:  Continue maintenance inhalers as usual  I sent nebulizer medicine to use every 4 hours or so while you are feeling sick  I sent cough medicine  I sent antibiotics and steroids, this is going to be the most important medicine to get you feeling better  Left plan to do a chest x-ray today, continue with plan as above as long as there is no severe pneumonia - we will call you if anything changes based on the chest x-ray  For aches/pains, Tylenol 1000 mg every 6 hours, ibuprofen 800 mg every 6 hours is okay to take

## 2017-02-04 ENCOUNTER — Ambulatory Visit (HOSPITAL_BASED_OUTPATIENT_CLINIC_OR_DEPARTMENT_OTHER): Payer: Medicaid Other

## 2017-02-04 VITALS — BP 111/66 | HR 76 | Temp 98.2°F | Resp 16

## 2017-02-04 DIAGNOSIS — D509 Iron deficiency anemia, unspecified: Secondary | ICD-10-CM | POA: Diagnosis not present

## 2017-02-04 DIAGNOSIS — D508 Other iron deficiency anemias: Secondary | ICD-10-CM

## 2017-02-04 MED ORDER — SODIUM CHLORIDE 0.9 % IV SOLN
Freq: Once | INTRAVENOUS | Status: AC
  Administered 2017-02-04: 12:00:00 via INTRAVENOUS

## 2017-02-04 MED ORDER — SODIUM CHLORIDE 0.9 % IV SOLN
510.0000 mg | Freq: Once | INTRAVENOUS | Status: AC
  Administered 2017-02-04: 510 mg via INTRAVENOUS
  Filled 2017-02-04: qty 17

## 2017-02-04 NOTE — Patient Instructions (Signed)

## 2017-02-10 ENCOUNTER — Encounter: Payer: Self-pay | Admitting: Osteopathic Medicine

## 2017-02-11 ENCOUNTER — Ambulatory Visit (INDEPENDENT_AMBULATORY_CARE_PROVIDER_SITE_OTHER): Payer: Medicaid Other

## 2017-02-11 DIAGNOSIS — Z1231 Encounter for screening mammogram for malignant neoplasm of breast: Secondary | ICD-10-CM

## 2017-02-17 ENCOUNTER — Encounter: Payer: Self-pay | Admitting: Osteopathic Medicine

## 2017-02-17 ENCOUNTER — Telehealth: Payer: Self-pay

## 2017-02-17 MED ORDER — DOXYCYCLINE HYCLATE 100 MG PO TABS: 100.0000 mg | ORAL_TABLET | Freq: Two times a day (BID) | ORAL | 0 refills | Status: DC

## 2017-02-17 MED ORDER — DOXYCYCLINE MONOHYDRATE 100 MG PO CAPS: 100.0000 mg | ORAL_CAPSULE | Freq: Two times a day (BID) | ORAL | 0 refills | Status: DC

## 2017-02-17 NOTE — Telephone Encounter (Signed)
Switch doxy's

## 2017-03-02 ENCOUNTER — Other Ambulatory Visit: Payer: Self-pay | Admitting: Osteopathic Medicine

## 2017-03-05 ENCOUNTER — Encounter: Payer: Self-pay | Admitting: Osteopathic Medicine

## 2017-03-08 ENCOUNTER — Ambulatory Visit: Payer: Medicaid Other | Admitting: Emergency Medicine

## 2017-03-15 ENCOUNTER — Ambulatory Visit: Payer: Medicaid Other | Admitting: Endocrinology

## 2017-03-15 ENCOUNTER — Ambulatory Visit: Payer: Medicaid Other | Admitting: Dietician

## 2017-03-17 ENCOUNTER — Ambulatory Visit (INDEPENDENT_AMBULATORY_CARE_PROVIDER_SITE_OTHER): Payer: Medicaid Other | Admitting: Family Medicine

## 2017-03-17 ENCOUNTER — Ambulatory Visit (INDEPENDENT_AMBULATORY_CARE_PROVIDER_SITE_OTHER): Payer: Medicaid Other

## 2017-03-17 ENCOUNTER — Encounter: Payer: Self-pay | Admitting: Family Medicine

## 2017-03-17 VITALS — BP 125/73 | HR 99 | Temp 98.3°F | Wt 267.0 lb

## 2017-03-17 DIAGNOSIS — J449 Chronic obstructive pulmonary disease, unspecified: Secondary | ICD-10-CM

## 2017-03-17 DIAGNOSIS — R05 Cough: Secondary | ICD-10-CM | POA: Diagnosis not present

## 2017-03-17 DIAGNOSIS — R059 Cough, unspecified: Secondary | ICD-10-CM

## 2017-03-17 DIAGNOSIS — F17219 Nicotine dependence, cigarettes, with unspecified nicotine-induced disorders: Secondary | ICD-10-CM

## 2017-03-17 MED ORDER — PREDNISONE 50 MG PO TABS: 50.0000 mg | ORAL_TABLET | Freq: Every day | ORAL | 0 refills | Status: DC

## 2017-03-17 MED ORDER — IPRATROPIUM BROMIDE 0.06 % NA SOLN: 2.0000 | NASAL | 6 refills | Status: DC | PRN

## 2017-03-17 MED ORDER — CEFDINIR 300 MG PO CAPS: 300.0000 mg | ORAL_CAPSULE | Freq: Two times a day (BID) | ORAL | 0 refills | Status: DC

## 2017-03-17 MED ORDER — GUAIFENESIN-CODEINE 100-10 MG/5ML PO SOLN: 5.0000 mL | Freq: Four times a day (QID) | ORAL | 0 refills | Status: DC | PRN

## 2017-03-17 NOTE — Progress Notes (Signed)
Tabitha Estrada is a 47 y.o. female who presents to [facility]: [facility] today for sore throat wheezing cough congestion shortness of breath.  Patient originally became ill 6-8 weeks ago course of steroids and antibiotics improvement.  She had some symptoms until about a week ago when she worsened again developing hoarseness sore throat cough headaches runny nose congestion and wheezing.  She is tried some over-the-counter medications which have helped a bit.  Additionally she is tried using her albuterol inhaler which helps.  She has a history of COPD and notes that her current symptoms do feel somewhat consistent with COPD exacerbation.  She continues to smoke.   Past Medical History:  Diagnosis Date  . Allergy   . Anemia   . Anxiety   . Arthritis   . Asthma   . COPD (chronic obstructive pulmonary disease) ([place])   . Depression   . Diabetes mellitus without complication ([place])   . Dyspnea    with exertion and bronchititis  . Fibromyalgia   . GERD (gastroesophageal reflux disease)   . History of hiatal hernia   . History of kidney stones   . Hyperlipidemia   . Neuromuscular disorder ([place])   . Pneumonia    hs, 01-19-15  . Sleep apnea    has Cpap have not been using   Past Surgical History:  Procedure Laterality Date  . BACK SURGERY  2015,2016, 2018  . CARPAL TUNNEL RELEASE Right 2008  . CHOLECYSTECTOMY  1992  . KIDNEY STONE SURGERY  2015  . TONSILLECTOMY  1979  . TUBAL LIGATION  2005   Social History   Tobacco Use  . Smoking status: Current Every Day Smoker    Packs/day: 1.00    Years: 33.00    Pack years: 33.00    Types: Cigarettes  . Smokeless tobacco: Never Used  . Tobacco comment: 05/17/2016 still smokes  Substance Use Topics  . Alcohol use: No   family history includes Cancer in her maternal grandfather; Diabetes in her father and paternal aunt;  Emphysema in her maternal grandmother; Heart attack in her paternal uncle; Heart failure in her maternal grandmother; Hyperlipidemia in her father; Hypertension in her father and paternal aunt.  ROS as above:  Medications: Current Outpatient Medications  Medication Sig Dispense Refill  . albuterol (PROVENTIL HFA;VENTOLIN HFA) 108 (90 Base) MCG/ACT inhaler Inhale 1-2 puffs into the lungs every 4 (four) hours as needed for wheezing or shortness of breath. 1 Inhaler 12  . albuterol (PROVENTIL) (2.5 MG/3ML) 0.083% nebulizer solution Inhale the contents of one vial in nebulizer every four to six hours as needed for cough or wheeze. 180 mL 1  . atorvastatin (LIPITOR) 80 MG tablet Take 1 tablet (80 mg total) daily by mouth. 90 tablet 3  . BIOTIN PO Take by mouth daily.    . budesonide-formoterol (SYMBICORT) 160-4.5 MCG/ACT inhaler Inhale two puffs twice daily to prevent cough or wheeze.  Rinse, gargle, and spit after use. 1 Inhaler 5  . cetirizine (ZYRTEC) 10 MG tablet Can take one tablet by mouth once or twice daily if needed. 60 tablet 5  . cholecalciferol (VITAMIN D) 1000 units tablet Take 5,000 Units daily by mouth.     . clobetasol ointment (TEMOVATE) 2.95 % Apply 1 application topically 2 (two) times daily. 60 g 0  . Continuous Blood Gluc Receiver (FREESTYLE LIBRE READER) DEVI 1 each by Does not apply route daily. Used to check blood sugars  readings through out the day. 1 Device 0  . Continuous Blood Gluc Sensor (FREESTYLE LIBRE SENSOR SYSTEM) MISC 3 sensors for 30 days(1 month supply) change every 10 days. 3 each 3  . Cyanocobalamin (VITAMIN B-12) 5000 MCG TBDP Take 5,000 mcg by mouth daily.     . Diphenhyd-Hydrocort-Nystatin (FIRST-DUKES MOUTHWASH) SUSP Use as directed 5-10 mLs in the mouth or throat 4 (four) times daily. 237 mL 1  . docusate sodium (COLACE) 100 MG capsule Take 1 capsule (100 mg total) by mouth 2 (two) times daily. 60 capsule 0  . fluticasone (FLONASE) 50 MCG/ACT nasal spray Use  two sprays in each nostril once daily as directed. 16 g 5  . gabapentin (NEURONTIN) 400 MG capsule Take 3 capsules (1,200 mg total) by mouth 3 (three) times daily as needed (neuropathic pain). Max dose 3600 mg /24 hours 270 capsule 0  . Glucose Blood (BLOOD GLUCOSE TEST STRIPS) STRP 1 each by Other route 3 (three) times a day. Use as instructed    . insulin aspart (NOVOLOG FLEXPEN) 100 UNIT/ML FlexPen Inject 40 Units into the skin 3 (three) times daily with meals. 45 mL 6  . Insulin Glargine (LANTUS SOLOSTAR) 100 UNIT/ML Solostar Pen Inject 60 Units into the skin daily. And pen needles 5/day 10 pen PRN  . Insulin Pen Needle 32G X 4 MM MISC by Does not apply route.    Marland Kitchen ipratropium-albuterol (DUONEB) 0.5-2.5 (3) MG/3ML SOLN Take 3 mLs by nebulization every 4 (four) hours as needed (wheeze, SOB). 60 mL 3  . linagliptin (TRADJENTA) 5 MG TABS tablet Take 1 tablet (5 mg total) by mouth daily. 30 tablet 11  . montelukast (SINGULAIR) 10 MG tablet Take 1 tablet (10 mg total) by mouth daily. 30 tablet 5  . nystatin (MYCOSTATIN/NYSTOP) powder Apply 1 g topically 2 (two) times daily as needed.    Marland Kitchen omeprazole (PRILOSEC) 40 MG capsule Take 1 capsule (40 mg total) by mouth every morning. 30 capsule 5  . ondansetron (ZOFRAN-ODT) 8 MG disintegrating tablet Dissolve 1 tablet (8 mg total) by mouth every 8 (eight) hours as needed for nausea.    Marland Kitchen oxyCODONE-acetaminophen (PERCOCET/ROXICET) 5-325 MG tablet Take 1 tablet by mouth at bedtime.     . ranitidine (ZANTAC) 300 MG tablet Take 1 tablet (300 mg total) by mouth at bedtime. 30 tablet 5  . rizatriptan (MAXALT-MLT) 10 MG disintegrating tablet Take 1 tablet (10 mg total) by mouth as needed for migraine. May repeat in 2 hours if needed 10 tablet 3  . tamsulosin (FLOMAX) 0.4 MG CAPS capsule Take 0.4 mg by mouth daily.     Marland Kitchen topiramate (TOPAMAX) 50 MG tablet One half tab by mouth daily for one week then one tab by mouth daily. 90 tablet 3  . valACYclovir (VALTREX) 500  MG tablet Take 1 tablet (500 mg total) by mouth at bedtime. 30 tablet 1  . cefdinir (OMNICEF) 300 MG capsule Take 1 capsule (300 mg total) by mouth 2 (two) times daily. 14 capsule 0  . guaiFENesin-codeine 100-10 MG/5ML syrup Take 5 mLs by mouth every 6 (six) hours as needed for cough. 120 mL 0  . ipratropium (ATROVENT) 0.06 % nasal spray Place 2 sprays into both nostrils every 4 (four) hours as needed for rhinitis. 10 mL 6  . predniSONE (DELTASONE) 50 MG tablet Take 1 tablet (50 mg total) by mouth daily. 5 tablet 0   Current Facility-Administered Medications  Medication Dose Route Frequency Provider Last Rate Last Dose  .  0.9 %  sodium chloride infusion  500 mL Intravenous Continuous Nandigam, Venia Minks, MD       Allergies  Allergen Reactions  . Dulaglutide Other (See Comments)    Severe stomach pain  . Sulfur Nausea And Vomiting  . Metformin And Related Diarrhea    Health Maintenance Health Maintenance  Topic Date Due  . PNEUMOCOCCAL POLYSACCHARIDE VACCINE (1) 12/15/1973  . TETANUS/TDAP  12/16/1990  . INFLUENZA VACCINE  11/10/2017 (Originally 09/29/2016)  . URINE MICROALBUMIN  04/12/2017  . HEMOGLOBIN A1C  06/13/2017  . OPHTHALMOLOGY EXAM  09/13/2017  . FOOT EXAM  12/13/2017  . PAP SMEAR  01/12/2018  . HIV Screening  Completed     Exam:  BP 125/73   Pulse 99   Temp 98.3 F (36.8 C) (Oral)   Wt 267 lb (121.1 kg)   SpO2 95%   BMI 44.43 kg/m  Gen: Well NAD HEENT: EOMI,  MMM clear nasal discharge.  Posterior pharynx with cobblestoning.  Normal tympanic membranes bilaterally. Lungs: Normal work of breathing.  Coarse breath sounds bilaterally.  Wheezing present bilaterally. Heart: RRR no MRG Abd: NABS, Soft. Nondistended, Nontender Exts: Brisk capillary refill, warm and well perfused.    Chest x-ray pending   Assessment and Plan: 47 y.o. female with COPD exacerbation.  Plan for treatment with prednisone Omnicef to need albuterol.  Will work on sore throat and postnasal  drainage with Atrovent nasal spray to control cough with codeine cough syrup.  Recheck if not better. Work on smoking cessation.   Orders Placed This Encounter  Procedures  . DG Chest 2 View    Order Specific Question:   Reason for exam:    Answer:   Cough, assess intra-thoracic pathology    Order Specific Question:   Is the patient pregnant?    Answer:   No    Order Specific Question:   Preferred imaging location?    Answer:   [facility]   Meds ordered this encounter  Medications  . predniSONE (DELTASONE) 50 MG tablet    Sig: Take 1 tablet (50 mg total) by mouth daily.    Dispense:  5 tablet    Refill:  0  . cefdinir (OMNICEF) 300 MG capsule    Sig: Take 1 capsule (300 mg total) by mouth 2 (two) times daily.    Dispense:  14 capsule    Refill:  0  . ipratropium (ATROVENT) 0.06 % nasal spray    Sig: Place 2 sprays into both nostrils every 4 (four) hours as needed for rhinitis.    Dispense:  10 mL    Refill:  6  . guaiFENesin-codeine 100-10 MG/5ML syrup    Sig: Take 5 mLs by mouth every 6 (six) hours as needed for cough.    Dispense:  120 mL    Refill:  0     Discussed warning signs or symptoms. Please see discharge instructions. Patient expresses understanding.

## 2017-03-17 NOTE — Patient Instructions (Signed)
Thank you for coming in today. Take prednisone and omnicef.  Get xray now.  Follow up with pulmonology.  Use the nasal spray for post nasal drainage.  Increase symbicort to twice daily.   Use the cough medicine as needed.   Call or go to the emergency room if you get worse, have trouble breathing, have chest pains, or palpitations.     Chronic Obstructive Pulmonary Disease Exacerbation Chronic obstructive pulmonary disease (COPD) is a common lung problem. In COPD, the flow of air from the lungs is limited. COPD exacerbations are times that breathing gets worse and you need extra treatment. Without treatment they can be life threatening. If they happen often, your lungs can become more damaged. If your COPD gets worse, your doctor may treat you with:  Medicines.  Oxygen.  Different ways to clear your airway, such as using a mask.  Follow these instructions at home:  Do not smoke.  Avoid tobacco smoke and other things that bother your lungs.  If given, take your antibiotic medicine as told. Finish the medicine even if you start to feel better.  Only take medicines as told by your doctor.  Drink enough fluids to keep your pee (urine) clear or pale yellow (unless your doctor has told you not to).  Use a cool mist machine (vaporizer).  If you use oxygen or a machine that turns liquid medicine into a mist (nebulizer), continue to use them as told.  Keep up with shots (vaccinations) as told by your doctor.  Exercise regularly.  Eat healthy foods.  Keep all doctor visits as told. Get help right away if:  You are very short of breath and it gets worse.  You have trouble talking.  You have bad chest pain.  You have blood in your spit (sputum).  You have a fever.  You keep throwing up (vomiting).  You feel weak, or you pass out (faint).  You feel confused.  You keep getting worse. This information is not intended to replace advice given to you by your health care  provider. Make sure you discuss any questions you have with your health care provider. Document Released: 02/04/2011 Document Revised: 07/24/2015 Document Reviewed: 10/20/2012 Elsevier Interactive Patient Education  2017 [employer].

## 2017-03-28 ENCOUNTER — Encounter: Payer: Self-pay | Admitting: Emergency Medicine

## 2017-03-28 ENCOUNTER — Ambulatory Visit: Payer: Medicaid Other | Admitting: Emergency Medicine

## 2017-03-28 DIAGNOSIS — G4733 Obstructive sleep apnea (adult) (pediatric): Secondary | ICD-10-CM | POA: Diagnosis not present

## 2017-03-28 DIAGNOSIS — J449 Chronic obstructive pulmonary disease, unspecified: Secondary | ICD-10-CM

## 2017-03-28 DIAGNOSIS — R05 Cough: Secondary | ICD-10-CM | POA: Diagnosis not present

## 2017-03-28 DIAGNOSIS — Z9989 Dependence on other enabling machines and devices: Secondary | ICD-10-CM

## 2017-03-28 DIAGNOSIS — R053 Chronic cough: Secondary | ICD-10-CM

## 2017-03-28 MED ORDER — FLUTICASONE PROPIONATE 50 MCG/ACT NA SUSP: NASAL | 5 refills | Status: AC

## 2017-03-28 NOTE — Patient Instructions (Addendum)
Continue Symbicort 2 puffs twice a day. You need to work hard on stopping your smoking.  This is contributing to your health problems, making it difficult for you to get over your current illness. Please continue your omeprazole, Zantac as you are taking them Please restart your fluticasone nasal spray, 2 sprays each nostril once a day. Please restart Zyrtec once a day Continue Atrovent nasal spray, 2 sprays each nostril 2-3 times daily Keep DuoNeb available to use as needed Please call our office if you are not improving in 2 weeks Please continue CPAP as you have been using it Follow with Dr Lamonte Sakai in 4 months or sooner if you have any problems.

## 2017-03-28 NOTE — Assessment & Plan Note (Signed)
Continue Symbicort 2 puffs twice a day. You need to work hard on stopping your smoking.  This is contributing to your health problems, making it difficult for you to get over your current illness. Please continue your omeprazole, Zantac as you are taking them Please restart your fluticasone nasal spray, 2 sprays each nostril once a day. Please restart Zyrtec once a day Continue Atrovent nasal spray, 2 sprays each nostril 2-3 times daily Keep DuoNeb available to use as needed Please call our office if you are not improving in 2 weeks Follow with Dr Lamonte Sakai in 4 months or sooner if you have any problems.

## 2017-03-28 NOTE — Assessment & Plan Note (Signed)
Continue CPAP every night. 

## 2017-03-28 NOTE — Progress Notes (Signed)
Subjective:    Patient ID: Tabitha Estrada, female    DOB: Apr 17, 1970, 47 y.o.   MRN: [id number]  COPD  She complains of cough, shortness of breath and wheezing. Pertinent negatives include no ear pain, fever, headaches, postnasal drip, rhinorrhea, sneezing, sore throat or trouble swallowing. Her past medical history is significant for COPD.   47 year old woman, active tobacco use (30+ pack years), obesity, obstructive sleep apnea (unable to tolerate CPAP), DM, allergic rhinitis, GERD. She has been seen by Dr. Neldon Mc for suspected obstructive lung disease, allergic rhinitis. She was first told that she might have asthma 4-5 yrs ago. She has a longstanding cough, usually non-productive. She has 4-5 episode a year, hoarseness, wheeze, head and chest congestion. Some increased colored mucous. Usually gets treated with steroids and abx under these circumstances.   She is currently managed on Spiriva, Symbicort for last 2 weeks; formerly on bevespi + qvar. Also Singulair, fluticasone on her regimen. Her GERD has been treated with Zantac and omeprazole, seems to be controlled.   Spirometry was performed on 11/09/16 that I have personally reviewed. This shows evidence for mixed obstruction and restriction with an FEV1 of 1.98 L (65% predicted) and an FEV1 to FVC ratio of 78%. There was bronchodilator response.  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 Antimitochondrial antibody negative, immunoglobulins normal with the exception of a slightly decreased IgG  She tells me that her sleep study was 4 yrs ago, Tabitha Estrada. She has a full face mask, has trouble with wearing it. She has gained 20 lbs over the last year.   ROV 12/09/16 -- Follow-up visit today for cough and dyspnea. She has a history of tobacco use, obesity, untreated obstructive sleep apnea, allergic rhinitis, GERD, suspected asthma. She underwent pulmonary function testing today that show principally restriction on spirometry  with some possible superimposed mild obstruction, no significant bronchodilator response, restricted volumes and a decreased diffusion capacity that does not fully correct to normal when adjusted for alveolar volume. At her last visit we stopped Spiriva, continued Symbicort.   ROV 03/28/17 --patient has a history of obesity, tobacco use (1 pk a day), obstructive sleep apnea (has a new mask), allergic rhinitis, GERD, combined restriction and possibly some superimposed mild obstruction on pulmonary function testing.  At her last visit we talked about changing her CPAP to nasal pillows to see if this would improve compliance - has helped her. She reports that she began to have HA, congestion, cough over a month ago. Was associated with a URI at the time. Received abx and steroids x 2 for 5 days. She has been good about wearing her CPAP, feels that she benefits, less daytime sleepiness. She still has hoarseness, raw throat. She stopped flonase, is on atrovent NS. Not on zyrtec right now.  She is on zantac and prilosec. She is using DuoNeb prn.     Review of Systems  Constitutional: Negative for fever and unexpected weight change.  HENT: Negative for congestion, dental problem, ear pain, nosebleeds, postnasal drip, rhinorrhea, sinus pressure, sneezing, sore throat and trouble swallowing.   Eyes: Negative for redness and itching.  Respiratory: Positive for cough, shortness of breath and wheezing. Negative for chest tightness.   Cardiovascular: Negative for palpitations and leg swelling.  Gastrointestinal: Negative for nausea and vomiting.  Genitourinary: Negative for dysuria.  Musculoskeletal: Negative for joint swelling.  Skin: Negative for rash.  Neurological: Negative for headaches.  Hematological: Does not bruise/bleed easily.  Psychiatric/Behavioral: Negative for dysphoric mood. The  patient is not nervous/anxious.    Past Medical History:  Diagnosis Date  . Allergy   . Anemia   . Anxiety   .  Arthritis   . Asthma   . COPD (chronic obstructive pulmonary disease) ([place])   . Depression   . Diabetes mellitus without complication ([place])   . Dyspnea    with exertion and bronchititis  . Fibromyalgia   . GERD (gastroesophageal reflux disease)   . History of hiatal hernia   . History of kidney stones   . Hyperlipidemia   . Neuromuscular disorder ([place])   . Pneumonia    hs, 01-19-15  . Sleep apnea    has Cpap have not been using     Family History  Problem Relation Age of Onset  . Diabetes Father   . Hyperlipidemia Father   . Hypertension Father   . Diabetes Paternal Aunt   . Hypertension Paternal Aunt   . Heart attack Paternal Uncle   . Emphysema Maternal Grandmother   . Heart failure Maternal Grandmother   . Cancer Maternal Grandfather   . Colon cancer Neg Hx      Social History   Socioeconomic History  . Marital status: Married    Spouse name: Not on file  . Number of children: Not on file  . Years of education: Not on file  . Highest education level: Not on file  Social Needs  . Financial resource strain: Not on file  . Food insecurity - worry: Not on file  . Food insecurity - inability: Not on file  . Transportation needs - medical: Not on file  . Transportation needs - non-medical: Not on file  Occupational History  . Not on file  Tobacco Use  . Smoking status: Current Every Day Smoker    Packs/day: 1.00    Years: 33.00    Pack years: 33.00    Types: Cigarettes  . Smokeless tobacco: Never Used  . Tobacco comment: 05/17/2016 still smokes  Substance and Sexual Activity  . Alcohol use: No  . Drug use: No  . Sexual activity: Not on file  Other Topics Concern  . Not on file  Social History Narrative  . Not on file     Allergies  Allergen Reactions  . Dulaglutide Other (See Comments)    Severe stomach pain  . Sulfur Nausea And Vomiting  . Metformin And Related Diarrhea     Outpatient Medications Prior to Visit  Medication Sig Dispense Refill    . albuterol (PROVENTIL HFA;VENTOLIN HFA) 108 (90 Base) MCG/ACT inhaler Inhale 1-2 puffs into the lungs every 4 (four) hours as needed for wheezing or shortness of breath. 1 Inhaler 12  . albuterol (PROVENTIL) (2.5 MG/3ML) 0.083% nebulizer solution Inhale the contents of one vial in nebulizer every four to six hours as needed for cough or wheeze. 180 mL 1  . atorvastatin (LIPITOR) 80 MG tablet Take 1 tablet (80 mg total) daily by mouth. 90 tablet 3  . BIOTIN PO Take by mouth daily.    . budesonide-formoterol (SYMBICORT) 160-4.5 MCG/ACT inhaler Inhale two puffs twice daily to prevent cough or wheeze.  Rinse, gargle, and spit after use. 1 Inhaler 5  . cetirizine (ZYRTEC) 10 MG tablet Can take one tablet by mouth once or twice daily if needed. 60 tablet 5  . cholecalciferol (VITAMIN D) 1000 units tablet Take 5,000 Units daily by mouth.     . clobetasol ointment (TEMOVATE) 8.67 % Apply 1 application topically 2 (two) times  daily. 60 g 0  . Cyanocobalamin (VITAMIN B-12) 5000 MCG TBDP Take 5,000 mcg by mouth daily.     . Diphenhyd-Hydrocort-Nystatin (FIRST-DUKES MOUTHWASH) SUSP Use as directed 5-10 mLs in the mouth or throat 4 (four) times daily. 237 mL 1  . docusate sodium (COLACE) 100 MG capsule Take 1 capsule (100 mg total) by mouth 2 (two) times daily. 60 capsule 0  . fluticasone (FLONASE) 50 MCG/ACT nasal spray Use two sprays in each nostril once daily as directed. 16 g 5  . gabapentin (NEURONTIN) 400 MG capsule Take 3 capsules (1,200 mg total) by mouth 3 (three) times daily as needed (neuropathic pain). Max dose 3600 mg /24 hours 270 capsule 0  . Glucose Blood (BLOOD GLUCOSE TEST STRIPS) STRP 1 each by Other route 3 (three) times a day. Use as instructed    . guaiFENesin-codeine 100-10 MG/5ML syrup Take 5 mLs by mouth every 6 (six) hours as needed for cough. 120 mL 0  . insulin aspart (NOVOLOG FLEXPEN) 100 UNIT/ML FlexPen Inject 40 Units into the skin 3 (three) times daily with meals. 45 mL 6  .  Insulin Glargine (LANTUS SOLOSTAR) 100 UNIT/ML Solostar Pen Inject 60 Units into the skin daily. And pen needles 5/day 10 pen PRN  . Insulin Pen Needle 32G X 4 MM MISC by Does not apply route.    Marland Kitchen ipratropium (ATROVENT) 0.06 % nasal spray Place 2 sprays into both nostrils every 4 (four) hours as needed for rhinitis. 10 mL 6  . ipratropium-albuterol (DUONEB) 0.5-2.5 (3) MG/3ML SOLN Take 3 mLs by nebulization every 4 (four) hours as needed (wheeze, SOB). 60 mL 3  . linagliptin (TRADJENTA) 5 MG TABS tablet Take 1 tablet (5 mg total) by mouth daily. 30 tablet 11  . montelukast (SINGULAIR) 10 MG tablet Take 1 tablet (10 mg total) by mouth daily. 30 tablet 5  . nystatin (MYCOSTATIN/NYSTOP) powder Apply 1 g topically 2 (two) times daily as needed.    Marland Kitchen omeprazole (PRILOSEC) 40 MG capsule Take 1 capsule (40 mg total) by mouth every morning. 30 capsule 5  . ondansetron (ZOFRAN-ODT) 8 MG disintegrating tablet Dissolve 1 tablet (8 mg total) by mouth every 8 (eight) hours as needed for nausea.    Marland Kitchen oxyCODONE-acetaminophen (PERCOCET/ROXICET) 5-325 MG tablet Take 1 tablet by mouth at bedtime.     . predniSONE (DELTASONE) 50 MG tablet Take 1 tablet (50 mg total) by mouth daily. 5 tablet 0  . ranitidine (ZANTAC) 300 MG tablet Take 1 tablet (300 mg total) by mouth at bedtime. 30 tablet 5  . rizatriptan (MAXALT-MLT) 10 MG disintegrating tablet Take 1 tablet (10 mg total) by mouth as needed for migraine. May repeat in 2 hours if needed 10 tablet 3  . tamsulosin (FLOMAX) 0.4 MG CAPS capsule Take 0.4 mg by mouth daily.     Marland Kitchen topiramate (TOPAMAX) 50 MG tablet One half tab by mouth daily for one week then one tab by mouth daily. 90 tablet 3  . valACYclovir (VALTREX) 500 MG tablet Take 1 tablet (500 mg total) by mouth at bedtime. 30 tablet 1  . cefdinir (OMNICEF) 300 MG capsule Take 1 capsule (300 mg total) by mouth 2 (two) times daily. 14 capsule 0  . Continuous Blood Gluc Receiver (FREESTYLE LIBRE READER) DEVI 1 each by  Does not apply route daily. Used to check blood sugars readings through out the day. 1 Device 0  . Continuous Blood Gluc Sensor (FREESTYLE LIBRE SENSOR SYSTEM) MISC 3 sensors for 30  days(1 month supply) change every 10 days. 3 each 3   Facility-Administered Medications Prior to Visit  Medication Dose Route Frequency Provider Last Rate Last Dose  . 0.9 %  sodium chloride infusion  500 mL Intravenous Continuous Nandigam, Venia Minks, MD            Objective:   Physical Exam Vitals:   03/28/17 1416  BP: 118/78  Pulse: (!) 106  SpO2: 96%  Weight: 263 lb (119.3 kg)  Height: 5\' 5"  (1.651 m)   Gen: Pleasant, overwt, in no distress,  normal affect  ENT: No lesions,  mouth clear,  oropharynx clear, no postnasal drip  Neck: No JVD, no stridor  Lungs: No use of accessory muscles, no wheezing  Cardiovascular: RRR, heart sounds normal, no murmur or gallops, no peripheral edema  Musculoskeletal: No deformities, no cyanosis or clubbing  Neuro: alert, non focal  Skin: Warm, no lesions or rashes      Assessment & Plan:  COPD mixed type (HCC) Continue Symbicort 2 puffs twice a day.   Chronic cough Continue Symbicort 2 puffs twice a day. You need to work hard on stopping your smoking.  This is contributing to your health problems, making it difficult for you to get over your current illness. Please continue your omeprazole, Zantac as you are taking them Please restart your fluticasone nasal spray, 2 sprays each nostril once a day. Please restart Zyrtec once a day Continue Atrovent nasal spray, 2 sprays each nostril 2-3 times daily Keep DuoNeb available to use as needed Please call our office if you are not improving in 2 weeks Follow with Dr Lamonte Sakai in 4 months or sooner if you have any problems.  OSA on CPAP Continue CPAP every night  Baltazar Apo, MD, PhD 03/28/2017, 2:52 PM [facility] [phone] or if no answer [phone]

## 2017-03-28 NOTE — Assessment & Plan Note (Signed)
Continue Symbicort 2 puffs twice a day  

## 2017-03-30 ENCOUNTER — Ambulatory Visit: Payer: Medicaid Other | Admitting: Endocrinology

## 2017-04-08 ENCOUNTER — Telehealth: Payer: Self-pay | Admitting: Osteopathic Medicine

## 2017-04-08 NOTE — Telephone Encounter (Signed)
Pt called stating that she went to pharmacy looking for compression socks. She was told by pharmacist that it would be better for her to have a prescription. She asked that you use [facility] located in [place] instead of pharmacy on file. The number is [phone].

## 2017-04-11 MED ORDER — AMBULATORY NON FORMULARY MEDICATION: 99 refills | Status: DC

## 2017-04-11 NOTE — Telephone Encounter (Signed)
Printed rx and placed in fax box

## 2017-04-11 NOTE — Telephone Encounter (Signed)
Left message advising of prescription being sent to pharmacy.

## 2017-04-11 NOTE — Telephone Encounter (Signed)
Routing to Provider for Rx details.

## 2017-04-11 NOTE — Telephone Encounter (Signed)
At provider's request, rx for compression socks faxed to [phone]. Confirmation rec'd.

## 2017-04-14 ENCOUNTER — Telehealth: Payer: Self-pay | Admitting: Emergency Medicine

## 2017-04-14 NOTE — Telephone Encounter (Signed)
Called and spoke to pt. Pt reports of head congestion, chills & sweats mainly with sleeping, wheezing, lower neck discomfort x1. pt described neck discomfort as an achy feeling.  Denies fever Pt taken mucinex with no relief.  RB please advise. Thanks

## 2017-04-14 NOTE — Telephone Encounter (Signed)
Would add chlorpheniramine 4mg  q6h prn for congestion.   Some of her sx sound like a new viral process on top of her previous sx. Could consider OTC meds like tylenol cold and flu. ? Whether she feels systemically sick, whether she need to come for a flu test.   If symptoms continue then we may need to consider imaging her sinuses to look for chronic sinus inflammation.

## 2017-04-14 NOTE — Telephone Encounter (Signed)
Pt is aware of below message and voiced her understanding. Pt stated she would like to try Chlorpheniramine and call back if symptoms do not improve. Nothing further is needed.

## 2017-04-21 ENCOUNTER — Encounter: Payer: Self-pay | Admitting: Osteopathic Medicine

## 2017-04-21 ENCOUNTER — Other Ambulatory Visit: Payer: Self-pay | Admitting: Osteopathic Medicine

## 2017-04-21 MED ORDER — GABAPENTIN 400 MG PO CAPS: 1200.0000 mg | ORAL_CAPSULE | Freq: Three times a day (TID) | ORAL | 0 refills | Status: DC | PRN

## 2017-04-29 ENCOUNTER — Other Ambulatory Visit: Payer: Self-pay | Admitting: Emergency Medicine

## 2017-04-29 DIAGNOSIS — G4733 Obstructive sleep apnea (adult) (pediatric): Secondary | ICD-10-CM

## 2017-05-02 ENCOUNTER — Other Ambulatory Visit: Payer: Self-pay

## 2017-05-02 ENCOUNTER — Encounter: Payer: Self-pay | Admitting: Emergency Medicine

## 2017-05-02 DIAGNOSIS — J329 Chronic sinusitis, unspecified: Secondary | ICD-10-CM

## 2017-05-02 MED ORDER — PREDNISONE 10 MG PO TABS: ORAL_TABLET | ORAL | 0 refills | Status: DC

## 2017-05-02 NOTE — Telephone Encounter (Signed)
I believe she needs CT scan sinuses to look for acute or chronic sinusitis  Also, please offer her pred taper if she is willing to take >> 30mg  x 3 days, then 20mg  x 3 days and then 10mg  x 3 days.

## 2017-05-02 NOTE — Telephone Encounter (Signed)
Called pt who stated she has had a headache, head congestion since last OV but has been worse x1 week and when she woke up this morning, she became very dizzy.  Pt stated she has been taking OTC mucinex and [facility] brand sinus/allergy med but has had no relief from taking those meds.  Dr. Lamonte Sakai, please advise on recommendations for pt. Thanks!

## 2017-05-04 ENCOUNTER — Encounter: Payer: Self-pay | Admitting: Osteopathic Medicine

## 2017-05-05 ENCOUNTER — Encounter: Payer: Self-pay | Admitting: Osteopathic Medicine

## 2017-05-05 ENCOUNTER — Ambulatory Visit (INDEPENDENT_AMBULATORY_CARE_PROVIDER_SITE_OTHER): Payer: Medicaid Other

## 2017-05-05 ENCOUNTER — Ambulatory Visit: Payer: Medicaid Other | Admitting: Osteopathic Medicine

## 2017-05-05 VITALS — BP 139/74 | HR 82 | Temp 98.1°F | Wt 267.0 lb

## 2017-05-05 DIAGNOSIS — J329 Chronic sinusitis, unspecified: Secondary | ICD-10-CM | POA: Diagnosis not present

## 2017-05-05 DIAGNOSIS — R42 Dizziness and giddiness: Secondary | ICD-10-CM

## 2017-05-05 MED ORDER — FLUCONAZOLE 150 MG PO TABS: 150.0000 mg | ORAL_TABLET | Freq: Once | ORAL | 1 refills | Status: AC

## 2017-05-05 MED ORDER — MECLIZINE HCL 25 MG PO TABS: 25.0000 mg | ORAL_TABLET | Freq: Three times a day (TID) | ORAL | 0 refills | Status: DC | PRN

## 2017-05-05 NOTE — Progress Notes (Signed)
HPI: Tabitha Estrada is a 47 y.o. female who  has a past medical history of Allergy, Anemia, Anxiety, Arthritis, Asthma, COPD (chronic obstructive pulmonary disease) ([place]), Depression, Diabetes mellitus without complication ([place]), Dyspnea, Fibromyalgia, GERD (gastroesophageal reflux disease), History of hiatal hernia, History of kidney stones, Hyperlipidemia, Neuromuscular disorder ([place]), Pneumonia, and Sleep apnea.  she presents to [facility] today, 05/05/17,  for chief complaint of:  Dizziness   Reports about 3 episodes this week, 2 on Monday and one yesterday, lasting a be a couple minutes, feeling "drunk" like lightheaded/dizzy. Difficult to describe but she denies orthostatic symptoms such as lightheadedness with change in position from lying to seated or seated to standing, denies dizziness/lightheadedness with head movement, reports persistent sinus pressure though recent CT scan of the sinuses was negative for sinusitis. No vision change, no headache. States she just feels a little bit off balance and the episodes have resolved spontaneously on their own.   Past medical history, surgical history, social history and family history reviewed. No updates needed.   Current medication list and allergy/intolerance information reviewed.    Current Outpatient Medications on File Prior to Visit  Medication Sig Dispense Refill  . albuterol (PROVENTIL HFA;VENTOLIN HFA) 108 (90 Base) MCG/ACT inhaler Inhale 1-2 puffs into the lungs every 4 (four) hours as needed for wheezing or shortness of breath. 1 Inhaler 12  . albuterol (PROVENTIL) (2.5 MG/3ML) 0.083% nebulizer solution Inhale the contents of one vial in nebulizer every four to six hours as needed for cough or wheeze. 180 mL 1  . AMBULATORY NON FORMULARY MEDICATION Compression stockings to knee or below knee, please measure patient for appropriate size. Disp: as preferred by patient or insurance. Sig: apply daily  to prevent lower extremity swelling. Dx: dependent edema 3 Units 99  . atorvastatin (LIPITOR) 80 MG tablet Take 1 tablet (80 mg total) daily by mouth. 90 tablet 3  . BIOTIN PO Take by mouth daily.    . budesonide-formoterol (SYMBICORT) 160-4.5 MCG/ACT inhaler Inhale two puffs twice daily to prevent cough or wheeze.  Rinse, gargle, and spit after use. 1 Inhaler 5  . cetirizine (ZYRTEC) 10 MG tablet Can take one tablet by mouth once or twice daily if needed. 60 tablet 5  . cholecalciferol (VITAMIN D) 1000 units tablet Take 5,000 Units daily by mouth.     . clobetasol ointment (TEMOVATE) 1.61 % Apply 1 application topically 2 (two) times daily. 60 g 0  . Cyanocobalamin (VITAMIN B-12) 5000 MCG TBDP Take 5,000 mcg by mouth daily.     . Diphenhyd-Hydrocort-Nystatin (FIRST-DUKES MOUTHWASH) SUSP Use as directed 5-10 mLs in the mouth or throat 4 (four) times daily. 237 mL 1  . docusate sodium (COLACE) 100 MG capsule Take 1 capsule (100 mg total) by mouth 2 (two) times daily. 60 capsule 0  . fluticasone (FLONASE) 50 MCG/ACT nasal spray Use two sprays in each nostril once daily as directed. 16 g 5  . gabapentin (NEURONTIN) 400 MG capsule Take 3 capsules (1,200 mg total) by mouth 3 (three) times daily as needed (neuropathic pain). Max dose 3600 mg /24 hours 270 capsule 0  . Glucose Blood (BLOOD GLUCOSE TEST STRIPS) STRP 1 each by Other route 3 (three) times a day. Use as instructed    . guaiFENesin-codeine 100-10 MG/5ML syrup Take 5 mLs by mouth every 6 (six) hours as needed for cough. 120 mL 0  . insulin aspart (NOVOLOG FLEXPEN) 100 UNIT/ML FlexPen Inject 40 Units into the skin  3 (three) times daily with meals. 45 mL 6  . Insulin Glargine (LANTUS SOLOSTAR) 100 UNIT/ML Solostar Pen Inject 60 Units into the skin daily. And pen needles 5/day 10 pen PRN  . Insulin Pen Needle 32G X 4 MM MISC by Does not apply route.    Marland Kitchen ipratropium (ATROVENT) 0.06 % nasal spray Place 2 sprays into both nostrils every 4 (four)  hours as needed for rhinitis. 10 mL 6  . ipratropium-albuterol (DUONEB) 0.5-2.5 (3) MG/3ML SOLN Take 3 mLs by nebulization every 4 (four) hours as needed (wheeze, SOB). 60 mL 3  . linagliptin (TRADJENTA) 5 MG TABS tablet Take 1 tablet (5 mg total) by mouth daily. 30 tablet 11  . montelukast (SINGULAIR) 10 MG tablet Take 1 tablet (10 mg total) by mouth daily. 30 tablet 5  . nystatin (MYCOSTATIN/NYSTOP) powder Apply 1 g topically 2 (two) times daily as needed.    Marland Kitchen omeprazole (PRILOSEC) 40 MG capsule Take 1 capsule (40 mg total) by mouth every morning. 30 capsule 5  . ondansetron (ZOFRAN-ODT) 8 MG disintegrating tablet Dissolve 1 tablet (8 mg total) by mouth every 8 (eight) hours as needed for nausea.    . predniSONE (DELTASONE) 10 MG tablet 3 tabs x 3 days, 2 tabs x 3 days, 1 tab x 3 days then stop 18 tablet 0  . ranitidine (ZANTAC) 300 MG tablet Take 1 tablet (300 mg total) by mouth at bedtime. 30 tablet 5  . rizatriptan (MAXALT-MLT) 10 MG disintegrating tablet Take 1 tablet (10 mg total) by mouth as needed for migraine. May repeat in 2 hours if needed 10 tablet 3  . topiramate (TOPAMAX) 50 MG tablet One half tab by mouth daily for one week then one tab by mouth daily. 90 tablet 3  . valACYclovir (VALTREX) 500 MG tablet Take 1 tablet (500 mg total) by mouth at bedtime. 30 tablet 1  . oxyCODONE-acetaminophen (PERCOCET/ROXICET) 5-325 MG tablet Take 1 tablet by mouth at bedtime.     . predniSONE (DELTASONE) 50 MG tablet Take 1 tablet (50 mg total) by mouth daily. (Patient not taking: Reported on 05/05/2017) 5 tablet 0  . tamsulosin (FLOMAX) 0.4 MG CAPS capsule Take 0.4 mg by mouth daily.      Current Facility-Administered Medications on File Prior to Visit  Medication Dose Route Frequency Provider Last Rate Last Dose  . 0.9 %  sodium chloride infusion  500 mL Intravenous Continuous Nandigam, Venia Minks, MD       Allergies  Allergen Reactions  . Dulaglutide Other (See Comments)    Severe stomach pain   . Sulfur Nausea And Vomiting  . Metformin And Related Diarrhea      Review of Systems:  Constitutional: No recent illness  HEENT: No  headache, no vision change  Cardiac: No  chest pain, No  pressure, No palpitations  Respiratory:  No  shortness of breath. No  Cough  Gastrointestinal: No  abdominal pain, no change on bowel habits  Musculoskeletal: No new myalgia/arthralgia  Skin: No  Rash  Hem/Onc: No  easy bruising/bleeding  Neurologic: No  weakness, +Dizziness  Psychiatric: No  concerns with depression, No  concerns with anxiety  Exam:  BP 139/74   Pulse 82   Temp 98.1 F (36.7 C) (Oral)   Wt 267 lb (121.1 kg)   BMI 44.43 kg/m   Constitutional: VS see above. General Appearance: alert, well-developed, well-nourished, NAD  Eyes: Normal lids and conjunctive, non-icteric sclera  Ears, Nose, Mouth, Throat: MMM, Normal external  inspection ears/nares/mouth/lips/gums.  Neck: No masses, trachea midline.   Respiratory: Normal respiratory effort. no wheeze, no rhonchi, no rales  Cardiovascular: S1/S2 normal, no murmur, no rub/gallop auscultated. RRR.   Musculoskeletal: Gait normal. Symmetric and independent movement of all extremities  Neurological: Normal balance/coordination. No tremor. PERRLA, EOMI. No nystagmus. Negative Dix-Hallpike maneuver bilaterally the patient's each she feels a little bit woozy when sitting up very quickly after maneuver. Strength 5 out of 5 in all extremities and symmetric. Sensation intact and symmetric bilaterally. Normal cerebellar reflexes.   Skin: warm, dry, intact.   Psychiatric: Normal judgment/insight. Normal mood and affect. Oriented x3.     ASSESSMENT/PLAN: The encounter diagnosis was Vertigo.   Symptoms probably more consistent with vertigo than orthostatic. Transient nature with quick recovery is certainly reassuring especially with normal neuro exam here, but would proceed to MRI if worsens/changing symptoms or if  persisting more than a few weeks. She has not had any episodes today. Meclizine as needed. Modified Epley maneuver instructions given to patient.  Patient also requests Diflucan for recent yeast infection.     Meds ordered this encounter  Medications  . meclizine (ANTIVERT) 25 MG tablet    Sig: Take 1 tablet (25 mg total) by mouth 3 (three) times daily as needed for dizziness.    Dispense:  30 tablet    Refill:  0  . fluconazole (DIFLUCAN) 150 MG tablet    Sig: Take 1 tablet (150 mg total) by mouth once for 1 dose. Repeat in 48 hours if yeast infection persists    Dispense:  2 tablet    Refill:  1   Orders Placed This Encounter  Procedures  . US Carotid Bilateral    Order Specific Question:   Reason for Exam (SYMPTOM  OR DIAGNOSIS REQUIRED)    Answer:   lightheaded, vertigo    Order Specific Question:   Preferred imaging location?    Answer:   [facility]      Follow-up plan: Return in about 2 weeks (around 05/19/2017) for if vertigo no better, sooner if worse/change .  Visit summary with medication list and pertinent instructions was printed for patient to review, alert [place] if any changes needed. All questions at time of visit were answered - patient instructed to contact office with any additional concerns. ER/RTC precautions were reviewed with the patient and understanding verbalized.    Please note: voice recognition software was used to produce this document, and typos may escape review. Please contact Dr. Sheppard Coil for any needed clarifications.

## 2017-05-05 NOTE — Patient Instructions (Signed)
Vertigo °Vertigo is the feeling that you or your surroundings are moving when they are not. Vertigo can be dangerous if it occurs while you are doing something that could endanger you or others, such as driving. °What are the causes? °This condition is caused by a disturbance in the signals that are sent by your body’s sensory systems to your brain. Different causes of a disturbance can lead to vertigo, including: °· Infections, especially in the inner ear. °· A bad reaction to a drug, or misuse of alcohol and medicines. °· Withdrawal from drugs or alcohol. °· Quickly changing positions, as when lying down or rolling over in bed. °· Migraine headaches. °· Decreased blood flow to the brain. °· Decreased blood pressure. °· Increased pressure in the brain from a head or neck injury, stroke, infection, tumor, or bleeding. °· Central nervous system disorders. ° °What are the signs or symptoms? °Symptoms of this condition usually occur when you move your head or your eyes in different directions. Symptoms may start suddenly, and they usually last for less than a minute. Symptoms may include: °· Loss of balance and falling. °· Feeling like you are spinning or moving. °· Feeling like your surroundings are spinning or moving. °· Nausea and vomiting. °· Blurred vision or double vision. °· Difficulty hearing. °· Slurred speech. °· Dizziness. °· Involuntary eye movement (nystagmus). ° °Symptoms can be mild and cause only slight annoyance, or they can be severe and interfere with daily life. Episodes of vertigo may return (recur) over time, and they are often triggered by certain movements. Symptoms may improve over time. °How is this diagnosed? °This condition may be diagnosed based on medical history and the quality of your nystagmus. Your health care provider may test your eye movements by asking you to quickly change positions to trigger the nystagmus. This may be called the Dix-Hallpike test, head thrust test, or roll test.  You may be referred to a health care provider who specializes in ear, nose, and throat (ENT) problems (otolaryngologist) or a provider who specializes in disorders of the central nervous system (neurologist). °You may have additional testing, including: °· A physical exam. °· Blood tests. °· MRI. °· A CT scan. °· An electrocardiogram (ECG). This records electrical activity in your heart. °· An electroencephalogram (EEG). This records electrical activity in your brain. °· Hearing tests. ° °How is this treated? °Treatment for this condition depends on the cause and the severity of the symptoms. Treatment options include: °· Medicines to treat nausea or vertigo. These are usually used for severe cases. Some medicines that are used to treat other conditions may also reduce or eliminate vertigo symptoms. These include: °? Medicines that control allergies (antihistamines). °? Medicines that control seizures (anticonvulsants). °? Medicines that relieve depression (antidepressants). °? Medicines that relieve anxiety (sedatives). °· Head movements to adjust your inner ear back to normal. If your vertigo is caused by an ear problem, your health care provider may recommend certain movements to correct the problem. °· Surgery. This is rare. ° °Follow these instructions at home: °Safety °· Move slowly.Avoid sudden body or head movements. °· Avoid driving. °· Avoid operating heavy machinery. °· Avoid doing any tasks that would cause danger to you or others if you would have a vertigo episode during the task. °· If you have trouble walking or keeping your balance, try using a cane for stability. If you feel dizzy or unstable, sit down right away. °· Return to your normal activities as told by your   health care provider. Ask your health care provider what activities are safe for you. °General instructions °· Take over-the-counter and prescription medicines only as told by your health care provider. °· Avoid certain positions or  movements as told by your health care provider. °· Drink enough fluid to keep your urine clear or pale yellow. °· Keep all follow-up visits as told by your health care provider. This is important. °Contact a health care provider if: °· Your medicines do not relieve your vertigo or they make it worse. °· You have a fever. °· Your condition gets worse or you develop new symptoms. °· Your family or friends notice any behavioral changes. °· Your nausea or vomiting gets worse. °· You have numbness or a “pins and needles” sensation in part of your body. °Get help right away if: °· You have difficulty moving or speaking. °· You are always dizzy. °· You faint. °· You develop severe headaches. °· You have weakness in your hands, arms, or legs. °· You have changes in your hearing or vision. °· You develop a stiff neck. °· You develop sensitivity to light. °This information is not intended to replace advice given to you by your health care provider. Make sure you discuss any questions you have with your health care provider. °Document Released: 11/25/2004 Document Revised: 07/30/2015 Document Reviewed: 06/10/2014 °Elsevier Interactive Patient Education © 2018 [employer] ° °

## 2017-05-09 ENCOUNTER — Other Ambulatory Visit: Payer: Self-pay

## 2017-05-09 ENCOUNTER — Telehealth: Payer: Self-pay | Admitting: Emergency Medicine

## 2017-05-09 DIAGNOSIS — G4733 Obstructive sleep apnea (adult) (pediatric): Secondary | ICD-10-CM

## 2017-05-09 MED ORDER — VALACYCLOVIR HCL 500 MG PO TABS: 500.0000 mg | ORAL_TABLET | Freq: Every day | ORAL | 1 refills | Status: DC

## 2017-05-09 NOTE — Telephone Encounter (Signed)
Called and spoke with Threasa Beards from [facility] she states that notes were sent over in regards to the patient, however on the notes it was not sufficient enough for them to do the CPAP. She is asking that we send over the correct prescription with settings, the doctors signature and NPI number. RB please advise are you ok with [place] doing this, thanks.

## 2017-05-10 ENCOUNTER — Other Ambulatory Visit: Payer: Self-pay | Admitting: Allergy and Immunology

## 2017-05-10 NOTE — Telephone Encounter (Signed)
RB, please advise on what settings the patient needs to be on so we can send the correct order to [place]. Thanks!

## 2017-05-11 ENCOUNTER — Ambulatory Visit (HOSPITAL_BASED_OUTPATIENT_CLINIC_OR_DEPARTMENT_OTHER): Payer: Medicaid Other

## 2017-05-13 ENCOUNTER — Ambulatory Visit (HOSPITAL_BASED_OUTPATIENT_CLINIC_OR_DEPARTMENT_OTHER)
Admission: RE | Admit: 2017-05-13 | Discharge: 2017-05-13 | Disposition: A | Discharge status: Home / Self Care | Payer: Medicaid Other | Source: Ambulatory Visit | Attending: Osteopathic Medicine | Admitting: Osteopathic Medicine

## 2017-05-13 DIAGNOSIS — R42 Dizziness and giddiness: Secondary | ICD-10-CM | POA: Diagnosis not present

## 2017-05-13 DIAGNOSIS — I6523 Occlusion and stenosis of bilateral carotid arteries: Secondary | ICD-10-CM | POA: Insufficient documentation

## 2017-05-16 NOTE — Telephone Encounter (Signed)
RB please advise on this, thanks.

## 2017-05-17 NOTE — Telephone Encounter (Signed)
Her sleep study was done at [facility] over 5 years ago and I do not have that information.  We do not have a copy of the study.  Think the best thing would be to order an auto titration setting with a range of 5-20 cm water.  Mask of choice (I believe she currently uses nasal pillows but you can ask her).

## 2017-05-18 ENCOUNTER — Encounter: Payer: Self-pay | Admitting: Osteopathic Medicine

## 2017-05-18 ENCOUNTER — Ambulatory Visit: Payer: Medicaid Other | Admitting: Osteopathic Medicine

## 2017-05-18 VITALS — BP 146/73 | HR 86 | Temp 98.3°F | Wt 272.1 lb

## 2017-05-18 DIAGNOSIS — I6523 Occlusion and stenosis of bilateral carotid arteries: Secondary | ICD-10-CM

## 2017-05-18 DIAGNOSIS — R8290 Unspecified abnormal findings in urine: Secondary | ICD-10-CM | POA: Diagnosis not present

## 2017-05-18 DIAGNOSIS — R1030 Lower abdominal pain, unspecified: Secondary | ICD-10-CM

## 2017-05-18 NOTE — Telephone Encounter (Signed)
Order has been placed per RB. Nothing further was needed. 

## 2017-05-18 NOTE — Progress Notes (Signed)
HPI: Tabitha Estrada is a 47 y.o. female who  has a past medical history of Allergy, Anemia, Anxiety, Arthritis, Asthma, COPD (chronic obstructive pulmonary disease) ([place]), Depression, Diabetes mellitus without complication ([place]), Dyspnea, Fibromyalgia, GERD (gastroesophageal reflux disease), History of hiatal hernia, History of kidney stones, Hyperlipidemia, Neuromuscular disorder ([place]), Pneumonia, and Sleep apnea.  she presents to [facility] today, 05/18/17,  for chief complaint of:  Follow-up Vertigo  Lower abd pain ?UTI?   Seen a few weeks ago for vertigo, was advised to come back if not doing any better. Symptoms have resolved, carotid ultrasound showed minimal atherosclerotic plaque  Concerned today about some lower abdominal discomfort, abnormal odor to the urine, no dysuria or frequency, would like urine checked.  HTN: BP mild elevated, no CP/SOB, pt had to leave appt prior to BP recheck.     Past medical history, surgical history, social history and family history reviewed. No updates needed.   Current medication list and allergy/intolerance information reviewed.    Current Outpatient Medications on File Prior to Visit  Medication Sig Dispense Refill  . albuterol (PROVENTIL HFA;VENTOLIN HFA) 108 (90 Base) MCG/ACT inhaler Inhale 1-2 puffs into the lungs every 4 (four) hours as needed for wheezing or shortness of breath. 1 Inhaler 12  . albuterol (PROVENTIL) (2.5 MG/3ML) 0.083% nebulizer solution Inhale the contents of one vial in nebulizer every four to six hours as needed for cough or wheeze. 180 mL 1  . AMBULATORY NON FORMULARY MEDICATION Compression stockings to knee or below knee, please measure patient for appropriate size. Disp: as preferred by patient or insurance. Sig: apply daily to prevent lower extremity swelling. Dx: dependent edema 3 Units 99  . atorvastatin (LIPITOR) 80 MG tablet Take 1 tablet (80 mg total) daily by mouth. 90 tablet  3  . BIOTIN PO Take by mouth daily.    . budesonide-formoterol (SYMBICORT) 160-4.5 MCG/ACT inhaler Inhale two puffs twice daily to prevent cough or wheeze.  Rinse, gargle, and spit after use. 1 Inhaler 5  . cetirizine (ZYRTEC) 10 MG tablet Can take one tablet by mouth once or twice daily if needed. 60 tablet 5  . cholecalciferol (VITAMIN D) 1000 units tablet Take 5,000 Units daily by mouth.     . clobetasol ointment (TEMOVATE) 4.09 % Apply 1 application topically 2 (two) times daily. 60 g 0  . Cyanocobalamin (VITAMIN B-12) 5000 MCG TBDP Take 5,000 mcg by mouth daily.     . Diphenhyd-Hydrocort-Nystatin (FIRST-DUKES MOUTHWASH) SUSP Use as directed 5-10 mLs in the mouth or throat 4 (four) times daily. 237 mL 1  . docusate sodium (COLACE) 100 MG capsule Take 1 capsule (100 mg total) by mouth 2 (two) times daily. 60 capsule 0  . fluticasone (FLONASE) 50 MCG/ACT nasal spray Use two sprays in each nostril once daily as directed. 16 g 5  . gabapentin (NEURONTIN) 400 MG capsule Take 3 capsules (1,200 mg total) by mouth 3 (three) times daily as needed (neuropathic pain). Max dose 3600 mg /24 hours 270 capsule 0  . Glucose Blood (BLOOD GLUCOSE TEST STRIPS) STRP 1 each by Other route 3 (three) times a day. Use as instructed    . guaiFENesin-codeine 100-10 MG/5ML syrup Take 5 mLs by mouth every 6 (six) hours as needed for cough. 120 mL 0  . insulin aspart (NOVOLOG FLEXPEN) 100 UNIT/ML FlexPen Inject 40 Units into the skin 3 (three) times daily with meals. 45 mL 6  . Insulin Glargine (LANTUS SOLOSTAR) 100 UNIT/ML  Solostar Pen Inject 60 Units into the skin daily. And pen needles 5/day 10 pen PRN  . Insulin Pen Needle 32G X 4 MM MISC by Does not apply route.    Marland Kitchen ipratropium (ATROVENT) 0.06 % nasal spray Place 2 sprays into both nostrils every 4 (four) hours as needed for rhinitis. 10 mL 6  . ipratropium-albuterol (DUONEB) 0.5-2.5 (3) MG/3ML SOLN Take 3 mLs by nebulization every 4 (four) hours as needed (wheeze,  SOB). 60 mL 3  . linagliptin (TRADJENTA) 5 MG TABS tablet Take 1 tablet (5 mg total) by mouth daily. 30 tablet 11  . meclizine (ANTIVERT) 25 MG tablet Take 1 tablet (25 mg total) by mouth 3 (three) times daily as needed for dizziness. 30 tablet 0  . montelukast (SINGULAIR) 10 MG tablet Take 1 tablet (10 mg total) by mouth daily. 30 tablet 5  . nystatin (MYCOSTATIN/NYSTOP) powder Apply 1 g topically 2 (two) times daily as needed.    Marland Kitchen omeprazole (PRILOSEC) 40 MG capsule Take 1 capsule (40 mg total) by mouth every morning. 30 capsule 0  . ondansetron (ZOFRAN-ODT) 8 MG disintegrating tablet Dissolve 1 tablet (8 mg total) by mouth every 8 (eight) hours as needed for nausea.    Marland Kitchen oxyCODONE-acetaminophen (PERCOCET/ROXICET) 5-325 MG tablet Take 1 tablet by mouth at bedtime.     . predniSONE (DELTASONE) 10 MG tablet 3 tabs x 3 days, 2 tabs x 3 days, 1 tab x 3 days then stop 18 tablet 0  . ranitidine (ZANTAC) 300 MG tablet Take 1 tablet (300 mg total) by mouth at bedtime. 30 tablet 5  . rizatriptan (MAXALT-MLT) 10 MG disintegrating tablet Take 1 tablet (10 mg total) by mouth as needed for migraine. May repeat in 2 hours if needed 10 tablet 3  . tamsulosin (FLOMAX) 0.4 MG CAPS capsule Take 0.4 mg by mouth daily.     Marland Kitchen topiramate (TOPAMAX) 50 MG tablet One half tab by mouth daily for one week then one tab by mouth daily. 90 tablet 3  . valACYclovir (VALTREX) 500 MG tablet Take 1 tablet (500 mg total) by mouth at bedtime. 30 tablet 1   Current Facility-Administered Medications on File Prior to Visit  Medication Dose Route Frequency Provider Last Rate Last Dose  . 0.9 %  sodium chloride infusion  500 mL Intravenous Continuous Nandigam, Venia Minks, MD       Allergies  Allergen Reactions  . Dulaglutide Other (See Comments)    Severe stomach pain  . Sulfur Nausea And Vomiting  . Metformin And Related Diarrhea      Review of Systems:  Constitutional: No recent illness  HEENT: No  headache,  Cardiac:  No  chest pain  Gastrointestinal: +abdominal pain, no change on bowel habits  Musculoskeletal: No new myalgia/arthralgia  Skin: No  Rash  Neurologic: No  weakness, No dizziness   Exam:  BP (!) 146/73 (BP Location: Left Arm)   Pulse 86   Temp 98.3 F (36.8 C) (Oral)   Wt 272 lb 1.9 oz (123.4 kg)   BMI 45.28 kg/m   Constitutional: VS see above. General Appearance: alert, well-developed, well-nourished, NAD  Eyes: Normal lids and conjunctive, non-icteric sclera  Ears, Nose, Mouth, Throat: MMM, Normal external inspection ears/nares/mouth/lips/gums.  Neck: No masses, trachea midline.   Respiratory: Normal respiratory effort.   Musculoskeletal: Gait normal. Symmetric and independent movement of all extremities  Neurological: Normal balance/coordination. No tremor.  Skin: warm, dry, intact.   Psychiatric: Normal judgment/insight. Normal mood and affect.  Oriented x3.   No results found for this or any previous visit (from the past 24 hour(s)).   ASSESSMENT/PLAN: The primary encounter diagnosis was Lower abdominal pain. Diagnoses of Abnormal urine odor and Carotid atherosclerosis, bilateral were also pertinent to this visit.  Pt had to leave appointment prior to UA results and BP recheck.   Cont BP, Glc control for atherosclerosis.    Follow-up plan: Return for recheck as needed .  Visit summary with medication list and pertinent instructions was printed for patient to review, alert [place] if any changes needed. All questions at time of visit were answered - patient instructed to contact office with any additional concerns. ER/RTC precautions were reviewed with the patient and understanding verbalized.    Please note: voice recognition software was used to produce this document, and typos may escape review. Please contact Dr. Sheppard Coil for any needed clarifications.

## 2017-05-19 ENCOUNTER — Other Ambulatory Visit: Payer: Self-pay | Admitting: Allergy and Immunology

## 2017-05-23 ENCOUNTER — Encounter: Payer: Self-pay | Admitting: Osteopathic Medicine

## 2017-05-25 ENCOUNTER — Other Ambulatory Visit: Payer: Self-pay | Admitting: Osteopathic Medicine

## 2017-05-26 ENCOUNTER — Ambulatory Visit: Payer: Medicaid Other | Admitting: Osteopathic Medicine

## 2017-06-02 ENCOUNTER — Telehealth: Payer: Self-pay | Admitting: Emergency Medicine

## 2017-06-02 NOTE — Telephone Encounter (Signed)
Called and spoke to pt. Pt questioning if we received her sleep study from [facility]. Spoke with Ria Comment, CMA, and was advised we have received her records. Pt is also complaining of sinus congestion, chest congestion, intermittent wheezing. Pt is requesting recs. Advised pt that because she has called in several times since last visit regarding similar s/s  - pt has been scheduled appt with TP on 4.8.2019. Offered a sooner appt this week but pt requested an appt for Monday, advised pt to call back if she would like a sooner appt or if her s/s worsen over the weekend to seek emergency care. Pt verbalized understanding and denied any further questions or concerns at this time.

## 2017-06-03 ENCOUNTER — Encounter: Payer: Self-pay | Admitting: Osteopathic Medicine

## 2017-06-03 DIAGNOSIS — R202 Paresthesia of skin: Principal | ICD-10-CM

## 2017-06-03 DIAGNOSIS — M79676 Pain in unspecified toe(s): Secondary | ICD-10-CM

## 2017-06-03 DIAGNOSIS — R209 Unspecified disturbances of skin sensation: Secondary | ICD-10-CM

## 2017-06-03 DIAGNOSIS — R200 Anesthesia of skin: Secondary | ICD-10-CM

## 2017-06-03 DIAGNOSIS — L84 Corns and callosities: Secondary | ICD-10-CM

## 2017-06-06 ENCOUNTER — Ambulatory Visit: Payer: Medicaid Other | Admitting: Adult Health

## 2017-06-06 ENCOUNTER — Telehealth: Payer: Self-pay | Admitting: Emergency Medicine

## 2017-06-06 NOTE — Telephone Encounter (Signed)
Will route message to [place].

## 2017-06-08 ENCOUNTER — Other Ambulatory Visit: Payer: Self-pay | Admitting: Allergy and Immunology

## 2017-06-13 NOTE — Telephone Encounter (Signed)
Attempted to call Melanie with [facility]. I did not receive an answer. I have left a message for Threasa Beards to return our call.

## 2017-06-14 NOTE — Telephone Encounter (Signed)
Attempted to call Threasa Beards to see if machine had been about pt receiving the CPAP machine but unable to speak with [facility].  Left a message for her to return our call.  Attempted to call pt due to unable to speak with [facility] but unable to reach pt.  Left message for pt to return our call.

## 2017-06-14 NOTE — Telephone Encounter (Signed)
Pt is calling back [phone] 

## 2017-06-14 NOTE — Telephone Encounter (Signed)
Called Tabitha Estrada, unable to reach left message to give [place] a call back. Called patient to advise her that [facility] did receive all paperwork and we will give her updates once they receive the machine. She verbalized understanding.

## 2017-06-16 ENCOUNTER — Other Ambulatory Visit: Payer: Self-pay | Admitting: Allergy and Immunology

## 2017-06-20 ENCOUNTER — Other Ambulatory Visit: Payer: Self-pay | Admitting: Allergy and Immunology

## 2017-06-24 ENCOUNTER — Other Ambulatory Visit: Payer: Self-pay | Admitting: Osteopathic Medicine

## 2017-06-24 ENCOUNTER — Encounter: Payer: Self-pay | Admitting: Osteopathic Medicine

## 2017-06-24 MED ORDER — FLUCONAZOLE 150 MG PO TABS: 150.0000 mg | ORAL_TABLET | Freq: Once | ORAL | 2 refills | Status: AC

## 2017-06-24 NOTE — Telephone Encounter (Signed)
[facility] requesting med refill. Pls advise if appropriate. Thanks.

## 2017-06-24 NOTE — Telephone Encounter (Signed)
Sent!

## 2017-06-24 NOTE — Telephone Encounter (Signed)
Pt has been updated.  

## 2017-06-26 ENCOUNTER — Other Ambulatory Visit: Payer: Self-pay | Admitting: Osteopathic Medicine

## 2017-06-28 ENCOUNTER — Other Ambulatory Visit: Payer: Self-pay | Admitting: Osteopathic Medicine

## 2017-06-28 ENCOUNTER — Encounter: Payer: Self-pay | Admitting: Osteopathic Medicine

## 2017-06-28 MED ORDER — GABAPENTIN 400 MG PO CAPS: 1200.0000 mg | ORAL_CAPSULE | Freq: Three times a day (TID) | ORAL | 3 refills | Status: DC

## 2017-06-30 ENCOUNTER — Encounter: Payer: Self-pay | Admitting: Emergency Medicine

## 2017-06-30 ENCOUNTER — Ambulatory Visit: Payer: Medicaid Other | Admitting: Emergency Medicine

## 2017-06-30 DIAGNOSIS — G4733 Obstructive sleep apnea (adult) (pediatric): Secondary | ICD-10-CM

## 2017-06-30 DIAGNOSIS — Z9989 Dependence on other enabling machines and devices: Secondary | ICD-10-CM | POA: Diagnosis not present

## 2017-06-30 DIAGNOSIS — J301 Allergic rhinitis due to pollen: Secondary | ICD-10-CM

## 2017-06-30 DIAGNOSIS — J019 Acute sinusitis, unspecified: Secondary | ICD-10-CM

## 2017-06-30 DIAGNOSIS — J449 Chronic obstructive pulmonary disease, unspecified: Secondary | ICD-10-CM | POA: Diagnosis not present

## 2017-06-30 DIAGNOSIS — J309 Allergic rhinitis, unspecified: Secondary | ICD-10-CM | POA: Insufficient documentation

## 2017-06-30 MED ORDER — AMOXICILLIN-POT CLAVULANATE 875-125 MG PO TABS: 1.0000 | ORAL_TABLET | Freq: Two times a day (BID) | ORAL | 0 refills | Status: DC

## 2017-06-30 NOTE — Assessment & Plan Note (Signed)
Please take Augmentin 875mg  twice a day for 10 days.  Start nasal saline washes.

## 2017-06-30 NOTE — Progress Notes (Signed)
Subjective:    Patient ID: Tabitha Estrada, female    DOB: 05-17-1970, 47 y.o.   MRN: [id number]  COPD  She complains of cough, shortness of breath and wheezing. Pertinent negatives include no ear pain, fever, headaches, postnasal drip, rhinorrhea, sneezing, sore throat or trouble swallowing. Her past medical history is significant for COPD.   48 year old woman, active tobacco use (30+ pack years), obesity, obstructive sleep apnea (unable to tolerate CPAP), DM, allergic rhinitis, GERD. She has been seen by Dr. Neldon Mc for suspected obstructive lung disease, allergic rhinitis. She was first told that she might have asthma 4-5 yrs ago. She has a longstanding cough, usually non-productive. She has 4-5 episode a year, hoarseness, wheeze, head and chest congestion. Some increased colored mucous. Usually gets treated with steroids and abx under these circumstances.   She is currently managed on Spiriva, Symbicort for last 2 weeks; formerly on bevespi + qvar. Also Singulair, fluticasone on her regimen. Her GERD has been treated with Zantac and omeprazole, seems to be controlled.   Spirometry was performed on 11/09/16 that I have personally reviewed. This shows evidence for mixed obstruction and restriction with an FEV1 of 1.98 L (65% predicted) and an FEV1 to FVC ratio of 78%. There was bronchodilator response.  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 Antimitochondrial antibody negative, immunoglobulins normal with the exception of a slightly decreased IgG  She tells me that her sleep study was 4 yrs ago, [facility]. She has a full face mask, has trouble with wearing it. She has gained 20 lbs over the last year.   ROV 12/09/16 -- Follow-up visit today for cough and dyspnea. She has a history of tobacco use, obesity, untreated obstructive sleep apnea, allergic rhinitis, GERD, suspected asthma. She underwent pulmonary function testing today that show principally restriction on spirometry  with some possible superimposed mild obstruction, no significant bronchodilator response, restricted volumes and a decreased diffusion capacity that does not fully correct to normal when adjusted for alveolar volume. At her last visit we stopped Spiriva, continued Symbicort.   ROV 03/28/17 --patient has a history of obesity, tobacco use (1 pk a day), obstructive sleep apnea (has a new mask), allergic rhinitis, GERD, combined restriction and possibly some superimposed mild obstruction on pulmonary function testing.  At her last visit we talked about changing her CPAP to nasal pillows to see if this would improve compliance - has helped her. She reports that she began to have HA, congestion, cough over a month ago. Was associated with a URI at the time. Received abx and steroids x 2 for 5 days. She has been good about wearing her CPAP, feels that she benefits, less daytime sleepiness. She still has hoarseness, raw throat. She stopped flonase, is on atrovent NS. Not on zyrtec right now.  She is on zantac and prilosec. She is using DuoNeb prn.   Acute OV 06/30/17 --47 year old obese woman, cigarette smoker with obstructive sleep apnea (CPAP compliance).  She has chronic cough in the setting of chronic rhinitis and GERD.  Her pulmonary function testing shows restriction with some possible superimposed obstruction.  She presents today for evaluation of increased congestion in her head, yellow nasal discharge, frontal and eye pain. ? A fever this am. She has some cough, usually non-prod. On singulair, flonase,  She did get her CPAP, has used it some but is having a lot of leak, needs a new mask.     Review of Systems  Constitutional: Negative for fever and unexpected  weight change.  HENT: Negative for congestion, dental problem, ear pain, nosebleeds, postnasal drip, rhinorrhea, sinus pressure, sneezing, sore throat and trouble swallowing.   Eyes: Negative for redness and itching.  Respiratory: Positive for cough,  shortness of breath and wheezing. Negative for chest tightness.   Cardiovascular: Negative for palpitations and leg swelling.  Gastrointestinal: Negative for nausea and vomiting.  Genitourinary: Negative for dysuria.  Musculoskeletal: Negative for joint swelling.  Skin: Negative for rash.  Neurological: Negative for headaches.  Hematological: Does not bruise/bleed easily.  Psychiatric/Behavioral: Negative for dysphoric mood. The patient is not nervous/anxious.    Past Medical History:  Diagnosis Date  . Allergy   . Anemia   . Anxiety   . Arthritis   . Asthma   . COPD (chronic obstructive pulmonary disease) ([place])   . Depression   . Diabetes mellitus without complication ([place])   . Dyspnea    with exertion and bronchititis  . Fibromyalgia   . GERD (gastroesophageal reflux disease)   . History of hiatal hernia   . History of kidney stones   . Hyperlipidemia   . Neuromuscular disorder ([place])   . Pneumonia    hs, 01-19-15  . Sleep apnea    has Cpap have not been using     Family History  Problem Relation Age of Onset  . Diabetes Father   . Hyperlipidemia Father   . Hypertension Father   . Diabetes Paternal Aunt   . Hypertension Paternal Aunt   . Heart attack Paternal Uncle   . Emphysema Maternal Grandmother   . Heart failure Maternal Grandmother   . Cancer Maternal Grandfather   . Colon cancer Neg Hx      Social History   Socioeconomic History  . Marital status: Married    Spouse name: Not on file  . Number of children: Not on file  . Years of education: Not on file  . Highest education level: Not on file  Occupational History  . Not on file  Social Needs  . Financial resource strain: Not on file  . Food insecurity:    Worry: Not on file    Inability: Not on file  . Transportation needs:    Medical: Not on file    Non-medical: Not on file  Tobacco Use  . Smoking status: Current Every Day Smoker    Packs/day: 1.00    Years: 33.00    Pack years: 33.00     Types: Cigarettes  . Smokeless tobacco: Never Used  . Tobacco comment: 05/17/2016 still smokes  Substance and Sexual Activity  . Alcohol use: No  . Drug use: No  . Sexual activity: Not on file  Lifestyle  . Physical activity:    Days per week: Not on file    Minutes per session: Not on file  . Stress: Not on file  Relationships  . Social connections:    Talks on phone: Not on file    Gets together: Not on file    Attends religious service: Not on file    Active member of club or organization: Not on file    Attends meetings of clubs or organizations: Not on file    Relationship status: Not on file  . Intimate partner violence:    Fear of current or ex partner: Not on file    Emotionally abused: Not on file    Physically abused: Not on file    Forced sexual activity: Not on file  Other Topics Concern  .  Not on file  Social History Narrative  . Not on file     Allergies  Allergen Reactions  . Dulaglutide Other (See Comments)    Severe stomach pain  . Sulfur Nausea And Vomiting  . Metformin And Related Diarrhea     Outpatient Medications Prior to Visit  Medication Sig Dispense Refill  . albuterol (PROVENTIL HFA;VENTOLIN HFA) 108 (90 Base) MCG/ACT inhaler Inhale 1-2 puffs into the lungs every 4 (four) hours as needed for wheezing or shortness of breath. 1 Inhaler 12  . albuterol (PROVENTIL) (2.5 MG/3ML) 0.083% nebulizer solution Inhale the contents of one vial in nebulizer every four to six hours as needed for cough or wheeze. 180 mL 1  . AMBULATORY NON FORMULARY MEDICATION Compression stockings to knee or below knee, please measure patient for appropriate size. Disp: as preferred by patient or insurance. Sig: apply daily to prevent lower extremity swelling. Dx: dependent edema 3 Units 99  . atorvastatin (LIPITOR) 80 MG tablet Take 1 tablet (80 mg total) daily by mouth. 90 tablet 3  . BIOTIN PO Take by mouth daily.    . budesonide-formoterol (SYMBICORT) 160-4.5 MCG/ACT  inhaler Inhale two puffs twice daily to prevent cough or wheeze.  Rinse, gargle, and spit after use. 1 Inhaler 5  . cetirizine (ZYRTEC) 10 MG tablet Can take one tablet by mouth once or twice daily if needed. 60 tablet 5  . cholecalciferol (VITAMIN D) 1000 units tablet Take 5,000 Units daily by mouth.     . clobetasol ointment (TEMOVATE) 5.95 % Apply 1 application topically 2 (two) times daily. 60 g 0  . Cyanocobalamin (VITAMIN B-12) 5000 MCG TBDP Take 5,000 mcg by mouth daily.     . Diphenhyd-Hydrocort-Nystatin (FIRST-DUKES MOUTHWASH) SUSP Use as directed 5-10 mLs in the mouth or throat 4 (four) times daily. 237 mL 1  . docusate sodium (COLACE) 100 MG capsule Take 1 capsule (100 mg total) by mouth 2 (two) times daily. 60 capsule 0  . fluticasone (FLONASE) 50 MCG/ACT nasal spray Use two sprays in each nostril once daily as directed. 16 g 5  . gabapentin (NEURONTIN) 400 MG capsule Take 3 capsules (1,200 mg total) by mouth 3 (three) times daily. Max dose 3600 mg /24 hours 810 capsule 3  . Glucose Blood (BLOOD GLUCOSE TEST STRIPS) STRP 1 each by Other route 3 (three) times a day. Use as instructed    . guaiFENesin-codeine 100-10 MG/5ML syrup Take 5 mLs by mouth every 6 (six) hours as needed for cough. 120 mL 0  . insulin aspart (NOVOLOG FLEXPEN) 100 UNIT/ML FlexPen Inject 40 Units into the skin 3 (three) times daily with meals. 45 mL 6  . Insulin Glargine (LANTUS SOLOSTAR) 100 UNIT/ML Solostar Pen Inject 60 Units into the skin daily. And pen needles 5/day 10 pen PRN  . Insulin Pen Needle 32G X 4 MM MISC by Does not apply route.    Marland Kitchen ipratropium (ATROVENT) 0.06 % nasal spray Place 2 sprays into both nostrils every 4 (four) hours as needed for rhinitis. 10 mL 6  . ipratropium-albuterol (DUONEB) 0.5-2.5 (3) MG/3ML SOLN Take 3 mLs by nebulization every 4 (four) hours as needed (wheeze, SOB). 60 mL 3  . linagliptin (TRADJENTA) 5 MG TABS tablet Take 1 tablet (5 mg total) by mouth daily. 30 tablet 11  .  meclizine (ANTIVERT) 25 MG tablet Take 1 tablet (25 mg total) by mouth 3 (three) times daily as needed for dizziness. 30 tablet 0  . montelukast (SINGULAIR) 10 MG  tablet Take 1 tablet (10 mg total) by mouth daily. 30 tablet 5  . nystatin (MYCOSTATIN/NYSTOP) powder Apply 1 g topically 2 (two) times daily as needed.    Marland Kitchen omeprazole (PRILOSEC) 40 MG capsule Take 1 capsule (40 mg total) by mouth every morning. 30 capsule 0  . ondansetron (ZOFRAN-ODT) 8 MG disintegrating tablet Dissolve 1 tablet (8 mg total) by mouth every 8 (eight) hours as needed for nausea.    . rizatriptan (MAXALT-MLT) 10 MG disintegrating tablet Take 1 tablet (10 mg total) by mouth as needed for migraine. May repeat in 2 hours if needed 10 tablet 3  . tamsulosin (FLOMAX) 0.4 MG CAPS capsule Take 0.4 mg by mouth daily.     . valACYclovir (VALTREX) 500 MG tablet Take 1 tablet (500 mg total) by mouth at bedtime. 30 tablet 1  . oxyCODONE-acetaminophen (PERCOCET/ROXICET) 5-325 MG tablet Take 1 tablet by mouth at bedtime.     . predniSONE (DELTASONE) 10 MG tablet 3 tabs x 3 days, 2 tabs x 3 days, 1 tab x 3 days then stop (Patient not taking: Reported on 06/30/2017) 18 tablet 0  . ranitidine (ZANTAC) 150 MG tablet Take 2 tablets by mouth at bedtime (Patient not taking: Reported on 06/30/2017) 60 tablet 0  . topiramate (TOPAMAX) 50 MG tablet One half tab by mouth daily for one week then one tab by mouth daily. (Patient not taking: Reported on 06/30/2017) 90 tablet 3   Facility-Administered Medications Prior to Visit  Medication Dose Route Frequency Provider Last Rate Last Dose  . 0.9 %  sodium chloride infusion  500 mL Intravenous Continuous Nandigam, Venia Minks, MD            Objective:   Physical Exam Vitals:   06/30/17 1533 06/30/17 1537  BP:  124/78  Pulse:  86  Temp: 99.2 F (37.3 C)   TempSrc: Oral   SpO2:  96%  Weight: 274 lb (124.3 kg)   Height: 5\' 5"  (1.651 m)    Gen: Pleasant, overwt, in no distress,  normal  affect  ENT: No lesions,  mouth clear,  oropharynx clear, no postnasal drip, tender to palpation frontal and maxillary regions  Neck: No JVD, no stridor  Lungs: No use of accessory muscles, no wheezing  Cardiovascular: RRR, heart sounds normal, no murmur or gallops, no peripheral edema  Musculoskeletal: No deformities, no cyanosis or clubbing  Neuro: alert, non focal  Skin: Warm, no lesions or rashes      Assessment & Plan:  OSA on CPAP Working on CPAP compliance.  She has a poor mask fit with a lot of leakage and needs a new mask.  She is in communication with the company about this.  COPD mixed type ([place]) Continue Symbicort as ordered.  Acute sinusitis Please take Augmentin 875mg  twice a day for 10 days.  Start nasal saline washes.   Allergic rhinitis Please continue your Singulair, Flonase as you have been taking them Please stop taking your ipratropium nasal spray for now as this will contribute to nasal dryness. Try using nasal saline rinses to clear the sinuses Restart either loratadine or over-the-counter Zyrtec once a day at least during the spring allergy season.  Baltazar Apo, MD, PhD 06/30/2017, 3:58 PM [facility] [phone] or if no answer [phone]

## 2017-06-30 NOTE — Assessment & Plan Note (Signed)
Continue Symbicort as ordered 

## 2017-06-30 NOTE — Assessment & Plan Note (Signed)
Working on CPAP compliance.  She has a poor mask fit with a lot of leakage and needs a new mask.  She is in communication with the company about this.

## 2017-06-30 NOTE — Addendum Note (Signed)
Addended by: Desmond Dike C on: 06/30/2017 03:59 PM   Modules accepted: Orders

## 2017-06-30 NOTE — Assessment & Plan Note (Signed)
Please continue your Singulair, Flonase as you have been taking them Please stop taking your ipratropium nasal spray for now as this will contribute to nasal dryness. Try using nasal saline rinses to clear the sinuses Restart either loratadine or over-the-counter Zyrtec once a day at least during the spring allergy season.

## 2017-06-30 NOTE — Patient Instructions (Addendum)
Please take Augmentin 875mg  twice a day for 10 days.  Please continue your Singulair, Flonase as you have been taking them Please stop taking your ipratropium nasal spray for now as this will contribute to nasal dryness. Try using nasal saline rinses to clear the sinuses Restart either loratadine or over-the-counter Zyrtec once a day at least during the spring allergy season. Please continue Symbicort 2 puffs twice a day.  Remember to rinse and gargle after using this medication. Try to work on continuing to wear your CPAP every night.  Agree with speaking with the company regarding a better mask fit. Follow with Dr Lamonte Sakai in 3 months or sooner if you have any problems.

## 2017-07-06 ENCOUNTER — Other Ambulatory Visit: Payer: Self-pay | Admitting: Osteopathic Medicine

## 2017-07-06 ENCOUNTER — Other Ambulatory Visit: Payer: Self-pay | Admitting: Allergy and Immunology

## 2017-07-08 ENCOUNTER — Ambulatory Visit (INDEPENDENT_AMBULATORY_CARE_PROVIDER_SITE_OTHER): Payer: Medicaid Other | Admitting: Endocrinology

## 2017-07-08 ENCOUNTER — Other Ambulatory Visit: Payer: Self-pay

## 2017-07-08 ENCOUNTER — Encounter: Payer: Self-pay | Admitting: Endocrinology

## 2017-07-08 ENCOUNTER — Encounter: Payer: Self-pay | Admitting: Osteopathic Medicine

## 2017-07-08 VITALS — BP 142/86 | HR 85 | Wt 277.0 lb

## 2017-07-08 DIAGNOSIS — E1165 Type 2 diabetes mellitus with hyperglycemia: Secondary | ICD-10-CM

## 2017-07-08 DIAGNOSIS — Z794 Long term (current) use of insulin: Secondary | ICD-10-CM

## 2017-07-08 LAB — POCT GLYCOSYLATED HEMOGLOBIN (HGB A1C): HEMOGLOBIN A1C: 9.8

## 2017-07-08 MED ORDER — INSULIN ASPART 100 UNIT/ML FLEXPEN: 40.0000 [IU] | PEN_INJECTOR | Freq: Every day | SUBCUTANEOUS | 6 refills | Status: AC

## 2017-07-08 MED ORDER — INSULIN GLARGINE 100 UNIT/ML SOLOSTAR PEN: 140.0000 [IU] | PEN_INJECTOR | Freq: Every day | SUBCUTANEOUS | 99 refills | Status: DC

## 2017-07-08 NOTE — Patient Instructions (Addendum)
check your blood sugar twice a day.  vary the time of day when you check, between before the 3 meals, and at bedtime.  also check if you have symptoms of your blood sugar being too high or too low.  please keep a record of the readings and bring it to your next appointment here (or you can bring the meter itself).  You can write it on any piece of paper.  please call us sooner if your blood sugar goes below 70, or if you have a lot of readings over 200.   Please change your insulins to those listed below.   Please call or message [place] next week, to tell us how the blood sugar is doing.  Some specialists say that taking a high amount of folic acid (4-5 mg per day) helps the neuropathy. Please come back for a follow-up appointment in 2 months.

## 2017-07-08 NOTE — Progress Notes (Signed)
Subjective:    Patient ID: Tabitha Estrada, female    DOB: Jul 28, 1970, 47 y.o.   MRN: [id number]  HPI Pt returns for f/u of diabetes mellitus:  DM type: Insulin-requiring type 2 Dx'ed: 0240 Complications: polyneuropathy Therapy: insulin since 2010 GDM: never DKA: never Severe hypoglycemia: never Pancreatitis: never Pancreatic imaging: never Other: she takes multiple daily injections; she did not tolerate trulicity (abd pain), metformin (n/v/d), or victoza (vaginitis); she does not qualify for bariatric surgery, due to medicaid.  Interval history: Meter is downloaded today, and the printout is scanned into the record. cbg's vary from 154-300's.  It is in general higher as the day goes on.  Main symptom is neuropathic pain.  She sometimes misses the breakfast and lunch novolog.   Past Medical History:  Diagnosis Date  . Allergy   . Anemia   . Anxiety   . Arthritis   . Asthma   . COPD (chronic obstructive pulmonary disease) ([place])   . Depression   . Diabetes mellitus without complication ([place])   . Dyspnea    with exertion and bronchititis  . Fibromyalgia   . GERD (gastroesophageal reflux disease)   . History of hiatal hernia   . History of kidney stones   . Hyperlipidemia   . Neuromuscular disorder ([place])   . Pneumonia    hs, 01-19-15  . Sleep apnea    has Cpap have not been using    Past Surgical History:  Procedure Laterality Date  . BACK SURGERY  2015,2016, 2018  . CARPAL TUNNEL RELEASE Right 2008  . CHOLECYSTECTOMY  1992  . KIDNEY STONE SURGERY  2015  . TONSILLECTOMY  1979  . TUBAL LIGATION  2005    Social History   Socioeconomic History  . Marital status: Married    Spouse name: Not on file  . Number of children: Not on file  . Years of education: Not on file  . Highest education level: Not on file  Occupational History  . Not on file  Social Needs  . Financial resource strain: Not on file  . Food insecurity:    Worry: Not on file    Inability: Not  on file  . Transportation needs:    Medical: Not on file    Non-medical: Not on file  Tobacco Use  . Smoking status: Current Every Day Smoker    Packs/day: 1.00    Years: 33.00    Pack years: 33.00    Types: Cigarettes  . Smokeless tobacco: Never Used  . Tobacco comment: 05/17/2016 still smokes  Substance and Sexual Activity  . Alcohol use: No  . Drug use: No  . Sexual activity: Not on file  Lifestyle  . Physical activity:    Days per week: Not on file    Minutes per session: Not on file  . Stress: Not on file  Relationships  . Social connections:    Talks on phone: Not on file    Gets together: Not on file    Attends religious service: Not on file    Active member of club or organization: Not on file    Attends meetings of clubs or organizations: Not on file    Relationship status: Not on file  . Intimate partner violence:    Fear of current or ex partner: Not on file    Emotionally abused: Not on file    Physically abused: Not on file    Forced sexual activity: Not on file  Other Topics  Concern  . Not on file  Social History Narrative  . Not on file    Current Outpatient Medications on File Prior to Visit  Medication Sig Dispense Refill  . albuterol (PROVENTIL HFA;VENTOLIN HFA) 108 (90 Base) MCG/ACT inhaler Inhale 1-2 puffs into the lungs every 4 (four) hours as needed for wheezing or shortness of breath. 1 Inhaler 12  . albuterol (PROVENTIL) (2.5 MG/3ML) 0.083% nebulizer solution Inhale the contents of one vial in nebulizer every four to six hours as needed for cough or wheeze. 180 mL 1  . AMBULATORY NON FORMULARY MEDICATION Compression stockings to knee or below knee, please measure patient for appropriate size. Disp: as preferred by patient or insurance. Sig: apply daily to prevent lower extremity swelling. Dx: dependent edema 3 Units 99  . amoxicillin-clavulanate (AUGMENTIN) 875-125 MG tablet Take 1 tablet by mouth 2 (two) times daily. 20 tablet 0  . atorvastatin  (LIPITOR) 80 MG tablet Take 1 tablet (80 mg total) daily by mouth. 90 tablet 3  . BIOTIN PO Take by mouth daily.    . budesonide-formoterol (SYMBICORT) 160-4.5 MCG/ACT inhaler Inhale two puffs twice daily to prevent cough or wheeze.  Rinse, gargle, and spit after use. 1 Inhaler 5  . clobetasol ointment (TEMOVATE) 5.36 % Apply 1 application topically 2 (two) times daily. 60 g 0  . Cyanocobalamin (VITAMIN B-12) 5000 MCG TBDP Take 5,000 mcg by mouth daily.     . Diphenhyd-Hydrocort-Nystatin (FIRST-DUKES MOUTHWASH) SUSP Use as directed 5-10 mLs in the mouth or throat 4 (four) times daily. 237 mL 1  . docusate sodium (COLACE) 100 MG capsule Take 1 capsule (100 mg total) by mouth 2 (two) times daily. 60 capsule 0  . fluticasone (FLONASE) 50 MCG/ACT nasal spray Use two sprays in each nostril once daily as directed. 16 g 5  . gabapentin (NEURONTIN) 400 MG capsule Take 3 capsules (1,200 mg total) by mouth 3 (three) times daily. Max dose 3600 mg /24 hours 810 capsule 3  . Glucose Blood (BLOOD GLUCOSE TEST STRIPS) STRP 1 each by Other route 3 (three) times a day. Use as instructed    . guaiFENesin-codeine 100-10 MG/5ML syrup Take 5 mLs by mouth every 6 (six) hours as needed for cough. 120 mL 0  . Insulin Pen Needle 32G X 4 MM MISC by Does not apply route.    Marland Kitchen ipratropium (ATROVENT) 0.06 % nasal spray Place 2 sprays into both nostrils every 4 (four) hours as needed for rhinitis. 10 mL 6  . ipratropium-albuterol (DUONEB) 0.5-2.5 (3) MG/3ML SOLN Take 3 mLs by nebulization every 4 (four) hours as needed (wheeze, SOB). 60 mL 3  . linagliptin (TRADJENTA) 5 MG TABS tablet Take 1 tablet (5 mg total) by mouth daily. 30 tablet 11  . meclizine (ANTIVERT) 25 MG tablet Take 1 tablet (25 mg total) by mouth 3 (three) times daily as needed for dizziness. 30 tablet 0  . montelukast (SINGULAIR) 10 MG tablet Take 1 tablet (10 mg total) by mouth daily. 30 tablet 5  . nystatin (MYCOSTATIN/NYSTOP) powder Apply 1 g topically 2  (two) times daily as needed.    Marland Kitchen omeprazole (PRILOSEC) 40 MG capsule Take 1 capsule (40 mg total) by mouth every morning. 30 capsule 0  . ondansetron (ZOFRAN-ODT) 8 MG disintegrating tablet Dissolve 1 tablet (8 mg total) by mouth every 8 (eight) hours as needed for nausea.    . rizatriptan (MAXALT-MLT) 10 MG disintegrating tablet Take 1 tablet (10 mg total) by mouth as needed for  migraine. May repeat in 2 hours if needed 10 tablet 3  . tamsulosin (FLOMAX) 0.4 MG CAPS capsule Take 0.4 mg by mouth daily.     . valACYclovir (VALTREX) 500 MG tablet Take 1 tablet (500 mg total) by mouth at bedtime. 30 tablet 1  . cetirizine (ZYRTEC) 10 MG tablet Can take one tablet by mouth once or twice daily if needed. (Patient not taking: Reported on 07/08/2017) 60 tablet 5  . cholecalciferol (VITAMIN D) 1000 units tablet Take 5,000 Units daily by mouth.     . oxyCODONE-acetaminophen (PERCOCET/ROXICET) 5-325 MG tablet Take 1 tablet by mouth at bedtime.     . ranitidine (ZANTAC) 150 MG tablet Take 2 tablets by mouth at bedtime (Patient not taking: Reported on 06/30/2017) 60 tablet 0  . topiramate (TOPAMAX) 50 MG tablet One half tab by mouth daily for one week then one tab by mouth daily. (Patient not taking: Reported on 06/30/2017) 90 tablet 3   Current Facility-Administered Medications on File Prior to Visit  Medication Dose Route Frequency Provider Last Rate Last Dose  . 0.9 %  sodium chloride infusion  500 mL Intravenous Continuous Nandigam, Venia Minks, MD        Allergies  Allergen Reactions  . Dulaglutide Other (See Comments)    Severe stomach pain  . Sulfur Nausea And Vomiting  . Metformin And Related Diarrhea    Family History  Problem Relation Age of Onset  . Diabetes Father   . Hyperlipidemia Father   . Hypertension Father   . Diabetes Paternal Aunt   . Hypertension Paternal Aunt   . Heart attack Paternal Uncle   . Emphysema Maternal Grandmother   . Heart failure Maternal Grandmother   . Cancer  Maternal Grandfather   . Colon cancer Neg Hx     BP (!) 142/86   Pulse 85   Wt 277 lb (125.6 kg)   SpO2 93%   BMI 46.10 kg/m    Review of Systems She denies hypoglycemia.  She has weight gain.      Objective:   Physical Exam VITAL SIGNS:  See vs page GENERAL: no distress Pulses: dorsalis pedis intact bilat.   MSK: no deformity of the feet CV: trace bilat leg edema.   Skin:  no ulcer on the feet.  normal color and temp on the feet. Neuro: sensation is intact to touch on the feet, but decreased from normal.     Lab Results  Component Value Date   HGBA1C 9.8 07/08/2017      Assessment & Plan:  Type 2 DM: worse Polyneuropathy: persistent Missed insulin doses: we discussed.  We'll reduce number of injections per day.  Patient Instructions  check your blood sugar twice a day.  vary the time of day when you check, between before the 3 meals, and at bedtime.  also check if you have symptoms of your blood sugar being too high or too low.  please keep a record of the readings and bring it to your next appointment here (or you can bring the meter itself).  You can write it on any piece of paper.  please call us sooner if your blood sugar goes below 70, or if you have a lot of readings over 200.   Please change your insulins to those listed below.   Please call or message [place] next week, to tell us how the blood sugar is doing.  Some specialists say that taking a high amount of folic acid (4-5 mg  per day) helps the neuropathy. Please come back for a follow-up appointment in 2 months.

## 2017-07-11 ENCOUNTER — Ambulatory Visit: Payer: Medicaid Other | Admitting: Osteopathic Medicine

## 2017-07-11 ENCOUNTER — Encounter: Payer: Self-pay | Admitting: Osteopathic Medicine

## 2017-07-11 VITALS — BP 135/73 | HR 87 | Temp 98.0°F | Wt 275.3 lb

## 2017-07-11 DIAGNOSIS — Z794 Long term (current) use of insulin: Secondary | ICD-10-CM | POA: Diagnosis not present

## 2017-07-11 DIAGNOSIS — K219 Gastro-esophageal reflux disease without esophagitis: Secondary | ICD-10-CM | POA: Diagnosis not present

## 2017-07-11 DIAGNOSIS — L501 Idiopathic urticaria: Secondary | ICD-10-CM

## 2017-07-11 DIAGNOSIS — N951 Menopausal and female climacteric states: Secondary | ICD-10-CM | POA: Diagnosis not present

## 2017-07-11 DIAGNOSIS — F17219 Nicotine dependence, cigarettes, with unspecified nicotine-induced disorders: Secondary | ICD-10-CM | POA: Diagnosis not present

## 2017-07-11 DIAGNOSIS — J449 Chronic obstructive pulmonary disease, unspecified: Secondary | ICD-10-CM | POA: Diagnosis not present

## 2017-07-11 DIAGNOSIS — E1165 Type 2 diabetes mellitus with hyperglycemia: Secondary | ICD-10-CM | POA: Diagnosis not present

## 2017-07-11 DIAGNOSIS — B009 Herpesviral infection, unspecified: Secondary | ICD-10-CM | POA: Diagnosis not present

## 2017-07-11 MED ORDER — MONTELUKAST SODIUM 10 MG PO TABS: 10.0000 mg | ORAL_TABLET | Freq: Every day | ORAL | 3 refills | Status: DC

## 2017-07-11 MED ORDER — RANITIDINE HCL 300 MG PO TABS: 300.0000 mg | ORAL_TABLET | Freq: Every day | ORAL | 3 refills | Status: DC

## 2017-07-11 MED ORDER — OMEPRAZOLE 40 MG PO CPDR: 40.0000 mg | DELAYED_RELEASE_CAPSULE | ORAL | 3 refills | Status: DC

## 2017-07-11 MED ORDER — ATORVASTATIN CALCIUM 80 MG PO TABS: 80.0000 mg | ORAL_TABLET | Freq: Every day | ORAL | 3 refills | Status: DC

## 2017-07-11 MED ORDER — VALACYCLOVIR HCL 500 MG PO TABS: 500.0000 mg | ORAL_TABLET | Freq: Every day | ORAL | 3 refills | Status: DC

## 2017-07-11 MED ORDER — VENLAFAXINE HCL ER 37.5 MG PO CP24: 37.5000 mg | ORAL_CAPSULE | Freq: Every day | ORAL | 1 refills | Status: DC

## 2017-07-11 MED ORDER — CETIRIZINE HCL 10 MG PO TABS: ORAL_TABLET | ORAL | 3 refills | Status: DC

## 2017-07-11 NOTE — Progress Notes (Signed)
HPI: Tabitha Estrada is a 47 y.o. female who  has a past medical history of Allergy, Anemia, Anxiety, Arthritis, Asthma, COPD (chronic obstructive pulmonary disease) ([place]), Depression, Diabetes mellitus without complication ([place]), Dyspnea, Fibromyalgia, GERD (gastroesophageal reflux disease), History of hiatal hernia, History of kidney stones, Hyperlipidemia, Neuromuscular disorder ([place]), Pneumonia, and Sleep apnea.  she presents to [facility] today, 07/11/17,  for chief complaint of:  Go over meds - needs refills  Doing well today, needs refills on meds  COPD: stable, following with pulm, no recent exacerbations  GERD: stable, on PPI and H2B.   DM2: stable, poor control, following with Endocrine.   New problem: perimenopause problems, mood swings with very occasional hot flashes. Inquires about medications to help with mood issues.      Past medical history, surgical history, and family history reviewed.  Current medication list and allergy/intolerance information reviewed.   (See remainder of HPI, as well as ROS, PE below)  No results found.  No results found for this or any previous visit (from the past 72 hour(s)).   ASSESSMENT/PLAN:   Perimenopause - Plan: venlafaxine XR (EFFEXOR XR) 37.5 MG 24 hr capsule  Chronic idiopathic urticaria - Plan: cetirizine (ZYRTEC) 10 MG tablet  COPD mixed type ([place]) - Plan: montelukast (SINGULAIR) 10 MG tablet  Cigarette nicotine dependence with nicotine-induced disorder  Gastroesophageal reflux disease, esophagitis presence not specified - Plan: omeprazole (PRILOSEC) 40 MG capsule, ranitidine (ZANTAC) 300 MG tablet  HSV (herpes simplex virus) infection - Plan: valACYclovir (VALTREX) 500 MG tablet  Type 2 diabetes mellitus with hyperglycemia, with long-term current use of insulin ([place]) - Plan: atorvastatin (LIPITOR) 80 MG tablet   Meds ordered this encounter  Medications  . omeprazole (PRILOSEC)  40 MG capsule    Sig: Take 1 capsule (40 mg total) by mouth every morning.    Dispense:  90 capsule    Refill:  3    Patient needs office visit.  Marland Kitchen valACYclovir (VALTREX) 500 MG tablet    Sig: Take 1 tablet (500 mg total) by mouth at bedtime.    Dispense:  90 tablet    Refill:  3  . atorvastatin (LIPITOR) 80 MG tablet    Sig: Take 1 tablet (80 mg total) by mouth daily.    Dispense:  90 tablet    Refill:  3    Please cancel 40 mg dose thanks  . montelukast (SINGULAIR) 10 MG tablet    Sig: Take 1 tablet (10 mg total) by mouth daily.    Dispense:  90 tablet    Refill:  3  . cetirizine (ZYRTEC) 10 MG tablet    Sig: Can take one tablet by mouth once or twice daily if needed.    Dispense:  90 tablet    Refill:  3  . ranitidine (ZANTAC) 300 MG tablet    Sig: Take 1 tablet (300 mg total) by mouth at bedtime.    Dispense:  90 tablet    Refill:  3    Needs appt.  . venlafaxine XR (EFFEXOR XR) 37.5 MG 24 hr capsule    Sig: Take 1 capsule (37.5 mg total) by mouth daily.    Dispense:  30 capsule    Refill:  1     Follow-up plan: Return for recheck on Effexor / perimenopause, sooner if needed .     ################################ ################################ ################################   Outpatient Encounter Medications as of 07/11/2017  Medication Sig Note  . albuterol (PROVENTIL HFA;VENTOLIN  HFA) 108 (90 Base) MCG/ACT inhaler Inhale 1-2 puffs into the lungs every 4 (four) hours as needed for wheezing or shortness of breath.   Marland Kitchen albuterol (PROVENTIL) (2.5 MG/3ML) 0.083% nebulizer solution Inhale the contents of one vial in nebulizer every four to six hours as needed for cough or wheeze.   Marland Kitchen AMBULATORY NON FORMULARY MEDICATION Compression stockings to knee or below knee, please measure patient for appropriate size. Disp: as preferred by patient or insurance. Sig: apply daily to prevent lower extremity swelling. Dx: dependent edema   . atorvastatin (LIPITOR) 80 MG tablet  Take 1 tablet (80 mg total) daily by mouth.   Marland Kitchen BIOTIN PO Take by mouth daily.   . budesonide-formoterol (SYMBICORT) 160-4.5 MCG/ACT inhaler Inhale two puffs twice daily to prevent cough or wheeze.  Rinse, gargle, and spit after use.   . cetirizine (ZYRTEC) 10 MG tablet Can take one tablet by mouth once or twice daily if needed.   . cholecalciferol (VITAMIN D) 1000 units tablet Take 5,000 Units daily by mouth.    . clobetasol ointment (TEMOVATE) 8.31 % Apply 1 application topically 2 (two) times daily. 11/09/2016: Taking PRN  . Cyanocobalamin (VITAMIN B-12) 5000 MCG TBDP Take 5,000 mcg by mouth daily.    . Diphenhyd-Hydrocort-Nystatin (FIRST-DUKES MOUTHWASH) SUSP Use as directed 5-10 mLs in the mouth or throat 4 (four) times daily.   Marland Kitchen docusate sodium (COLACE) 100 MG capsule Take 1 capsule (100 mg total) by mouth 2 (two) times daily. 11/09/2016: Taking PRN.  . fluticasone (FLONASE) 50 MCG/ACT nasal spray Use two sprays in each nostril once daily as directed.   . folic acid (FOLVITE) 1 MG tablet Take 1 mg by mouth daily.   Marland Kitchen gabapentin (NEURONTIN) 400 MG capsule Take 3 capsules (1,200 mg total) by mouth 3 (three) times daily. Max dose 3600 mg /24 hours   . Glucose Blood (BLOOD GLUCOSE TEST STRIPS) STRP 1 each by Other route 3 (three) times a day. Use as instructed   . insulin aspart (NOVOLOG FLEXPEN) 100 UNIT/ML FlexPen Inject 40 Units into the skin daily with supper.   . Insulin Glargine (LANTUS SOLOSTAR) 100 UNIT/ML Solostar Pen Inject 140 Units into the skin daily. And pen needles 5/day   . Insulin Pen Needle 32G X 4 MM MISC by Does not apply route.   Marland Kitchen ipratropium (ATROVENT) 0.06 % nasal spray Place 2 sprays into both nostrils every 4 (four) hours as needed for rhinitis.   Marland Kitchen ipratropium-albuterol (DUONEB) 0.5-2.5 (3) MG/3ML SOLN Take 3 mLs by nebulization every 4 (four) hours as needed (wheeze, SOB).   Marland Kitchen linagliptin (TRADJENTA) 5 MG TABS tablet Take 1 tablet (5 mg total) by mouth daily.   .  meclizine (ANTIVERT) 25 MG tablet Take 1 tablet (25 mg total) by mouth 3 (three) times daily as needed for dizziness.   . montelukast (SINGULAIR) 10 MG tablet Take 1 tablet (10 mg total) by mouth daily.   Marland Kitchen nystatin (MYCOSTATIN/NYSTOP) powder Apply 1 g topically 2 (two) times daily as needed.   Marland Kitchen omeprazole (PRILOSEC) 40 MG capsule Take 1 capsule (40 mg total) by mouth every morning.   . ondansetron (ZOFRAN-ODT) 8 MG disintegrating tablet Dissolve 1 tablet (8 mg total) by mouth every 8 (eight) hours as needed for nausea.   Marland Kitchen oxyCODONE-acetaminophen (PERCOCET/ROXICET) 5-325 MG tablet Take 1 tablet by mouth at bedtime.    . ranitidine (ZANTAC) 150 MG tablet Take 2 tablets by mouth at bedtime   . rizatriptan (MAXALT-MLT) 10 MG disintegrating  tablet Take 1 tablet (10 mg total) by mouth as needed for migraine. May repeat in 2 hours if needed   . tamsulosin (FLOMAX) 0.4 MG CAPS capsule Take 0.4 mg by mouth daily.    Marland Kitchen topiramate (TOPAMAX) 50 MG tablet One half tab by mouth daily for one week then one tab by mouth daily.   . valACYclovir (VALTREX) 500 MG tablet Take 1 tablet (500 mg total) by mouth at bedtime.    Facility-Administered Encounter Medications as of 07/11/2017  Medication  . 0.9 %  sodium chloride infusion   Allergies  Allergen Reactions  . Dulaglutide Other (See Comments)    Severe stomach pain  . Sulfur Nausea And Vomiting  . Metformin And Related Diarrhea      Review of Systems:  Constitutional: No recent illness  HEENT: No  headache, no vision change  Cardiac: No  chest pain, No  pressure, No palpitations  Respiratory:  No  shortness of breath. No  Cough  Gastrointestinal: No  abdominal pain  Musculoskeletal: No new myalgia/arthralgia  Skin: No  Rash  Neurologic: No  weakness, No  Dizziness  Psychiatric: No  concerns with depression, No  concerns with anxiety  Exam:  BP 135/73   Pulse 87   Temp 98 F (36.7 C) (Oral)   Wt 275 lb 4.8 oz (124.9 kg)   BMI 45.81  kg/m   Constitutional: VS see above. General Appearance: alert, well-developed, well-nourished, NAD  Eyes: Normal lids and conjunctive, non-icteric sclera  Ears, Nose, Mouth, Throat: MMM, Normal external inspection ears/nares/mouth/lips/gums.  Neck: No masses, trachea midline.   Respiratory: Normal respiratory effort. no wheeze, no rhonchi, no rales  Cardiovascular: S1/S2 normal, no murmur, no rub/gallop auscultated. RRR.   Musculoskeletal: Gait normal. Symmetric and independent movement of all extremities  Neurological: Normal balance/coordination. No tremor.  Skin: warm, dry, intact.   Psychiatric: Normal judgment/insight. Normal mood and affect. Oriented x3.   Visit summary with medication list and pertinent instructions was printed for patient to review, alert [place] if any changes needed. All questions at time of visit were answered - patient instructed to contact office with any additional concerns. ER/RTC precautions were reviewed with the patient and understanding verbalized.   Follow-up plan: Return for recheck on Effexor / perimenopause, sooner if needed .  Note: Total time spent 25 minutes, greater than 50% of the visit was spent face-to-face counseling and coordinating care for the following: The primary encounter diagnosis was Perimenopause. Diagnoses of Chronic idiopathic urticaria, COPD mixed type ([place]), Cigarette nicotine dependence with nicotine-induced disorder, Gastroesophageal reflux disease, esophagitis presence not specified, HSV (herpes simplex virus) infection, and Type 2 diabetes mellitus with hyperglycemia, with long-term current use of insulin ([place]) were also pertinent to this visit.Marland Kitchen  Please note: voice recognition software was used to produce this document, and typos may escape review. Please contact Dr. Sheppard Coil for any needed clarifications.

## 2017-07-13 ENCOUNTER — Encounter: Payer: Self-pay | Admitting: Osteopathic Medicine

## 2017-07-13 DIAGNOSIS — N951 Menopausal and female climacteric states: Secondary | ICD-10-CM | POA: Insufficient documentation

## 2017-07-22 ENCOUNTER — Encounter: Payer: Self-pay | Admitting: Osteopathic Medicine

## 2017-07-22 MED ORDER — NYSTATIN 100000 UNIT/GM EX POWD: 1.0000 g | Freq: Two times a day (BID) | CUTANEOUS | 2 refills | Status: DC | PRN

## 2017-07-28 ENCOUNTER — Inpatient Hospital Stay: Payer: Medicaid Other

## 2017-07-28 ENCOUNTER — Inpatient Hospital Stay: Payer: Medicaid Other | Attending: Family | Admitting: Family

## 2017-07-28 ENCOUNTER — Other Ambulatory Visit: Payer: Self-pay

## 2017-07-28 VITALS — BP 121/63 | HR 82 | Temp 98.9°F | Resp 20 | Wt 271.5 lb

## 2017-07-28 DIAGNOSIS — R232 Flushing: Secondary | ICD-10-CM | POA: Insufficient documentation

## 2017-07-28 DIAGNOSIS — R102 Pelvic and perineal pain: Secondary | ICD-10-CM | POA: Diagnosis not present

## 2017-07-28 DIAGNOSIS — R809 Proteinuria, unspecified: Secondary | ICD-10-CM | POA: Diagnosis not present

## 2017-07-28 DIAGNOSIS — F17210 Nicotine dependence, cigarettes, uncomplicated: Secondary | ICD-10-CM | POA: Diagnosis not present

## 2017-07-28 DIAGNOSIS — J449 Chronic obstructive pulmonary disease, unspecified: Secondary | ICD-10-CM | POA: Diagnosis not present

## 2017-07-28 DIAGNOSIS — D508 Other iron deficiency anemias: Secondary | ICD-10-CM | POA: Diagnosis present

## 2017-07-28 DIAGNOSIS — Z79899 Other long term (current) drug therapy: Secondary | ICD-10-CM | POA: Insufficient documentation

## 2017-07-28 DIAGNOSIS — R5383 Other fatigue: Secondary | ICD-10-CM | POA: Insufficient documentation

## 2017-07-28 DIAGNOSIS — G629 Polyneuropathy, unspecified: Secondary | ICD-10-CM | POA: Insufficient documentation

## 2017-07-28 DIAGNOSIS — D72829 Elevated white blood cell count, unspecified: Secondary | ICD-10-CM

## 2017-07-28 DIAGNOSIS — R319 Hematuria, unspecified: Secondary | ICD-10-CM | POA: Diagnosis not present

## 2017-07-28 DIAGNOSIS — R3915 Urgency of urination: Secondary | ICD-10-CM | POA: Insufficient documentation

## 2017-07-28 DIAGNOSIS — D72823 Leukemoid reaction: Secondary | ICD-10-CM

## 2017-07-28 DIAGNOSIS — R6883 Chills (without fever): Secondary | ICD-10-CM | POA: Diagnosis not present

## 2017-07-28 DIAGNOSIS — Z794 Long term (current) use of insulin: Secondary | ICD-10-CM

## 2017-07-28 DIAGNOSIS — D72828 Other elevated white blood cell count: Secondary | ICD-10-CM | POA: Diagnosis not present

## 2017-07-28 LAB — CMP (CANCER CENTER ONLY)
ALBUMIN: 3.7 g/dL (ref 3.5–5.0)
ALT: 16 U/L (ref 0–55)
AST: 12 U/L (ref 5–34)
Alkaline Phosphatase: 119 U/L (ref 40–150)
Anion gap: 13 — ABNORMAL HIGH (ref 3–11)
BUN: 14 mg/dL (ref 7–26)
CHLORIDE: 98 mmol/L (ref 98–109)
CO2: 27 mmol/L (ref 22–29)
Calcium: 9.6 mg/dL (ref 8.4–10.4)
Creatinine: 1.02 mg/dL (ref 0.60–1.10)
GFR, Est AFR Am: >60 mL/min (ref >60)
Glucose, Bld: 204 mg/dL — ABNORMAL HIGH (ref 70–140)
Potassium: 4.7 mmol/L (ref 3.5–5.1)
Sodium: 138 mmol/L (ref 136–145)
Total Bilirubin: 0.4 mg/dL (ref 0.2–1.2)
Total Protein: 7.3 g/dL (ref 6.4–8.3)

## 2017-07-28 LAB — CBC WITH DIFFERENTIAL (CANCER CENTER ONLY)
Band Neutrophils: 4 %
Basophils Absolute: 0 10*3/uL (ref 0.0–0.1)
Basophils Relative: 0 %
Blasts: 0 %
Eosinophils Absolute: 0.4 10*3/uL (ref 0.0–0.5)
Eosinophils Relative: 2 %
HCT: 42.7 % (ref 34.8–46.6)
Hemoglobin: 13.3 g/dL (ref 11.6–15.9)
LYMPHS ABS: 3.2 10*3/uL (ref 0.9–3.3)
Lymphocytes Relative: 15 %
MCH: 28.3 pg (ref 26.0–34.0)
MCHC: 31.1 g/dL — AB (ref 32.0–36.0)
MCV: 90.9 fL (ref 81.0–101.0)
METAMYELOCYTES PCT: 4 %
MONOS PCT: 1 %
Monocytes Absolute: 0.2 10*3/uL (ref 0.1–0.9)
Myelocytes: 0 %
NRBC: 1 /100{WBCs} — AB
Neutro Abs: 17.8 10*3/uL — ABNORMAL HIGH (ref 1.5–6.5)
Neutrophils Relative %: 74 %
Other: 0 %
PLATELETS: 390 10*3/uL (ref 145–400)
Promyelocytes Relative: 0 %
RBC: 4.7 MIL/uL (ref 3.70–5.32)
RDW: 18 % — ABNORMAL HIGH (ref 11.1–15.7)
WBC: 21.6 10*3/uL — AB (ref 3.9–10.0)

## 2017-07-28 NOTE — Progress Notes (Signed)
Hematology and Oncology Follow Up Visit  Tabitha Estrada [id number] 03-24-1970 47 y.o. 07/28/2017   Principle Diagnosis:  Reactive leukocytosis  Iron deficiency anemia   Current Therapy:   IV iron as indicated    Interim History:  Tabitha Estrada is here today for follow-up. She is still having blood and protein in her urine as well as pelvic discomfort and urinary urgency. She is seeing urology and is waiting to schedule a cystoscopy to assess for possible malignancy. Her WBC count is 21.6.  She is symptomatic with fatigue and chills.  SOB secondary to COPD is unchanged. She is still smoking 1 ppd.  No fever, n/v, cough, rash, dizziness, chest pain, palpitations, abdominal pain or changes in bowel or bladder habits.  She does have hot flashes intermittently.  The neuropathy in her feet and hands is unchanged. She has some puffiness in her feet and ankles that comes and goes. This appears when she is on her feet for an extended period of time and resolves once she sits and elevates her feet.  She has a good appetite and is staying well hydrated. Her weight is stable.   ECOG Performance Status: 1 - Symptomatic but completely ambulatory  Medications:  Allergies as of 07/28/2017      Reactions   Dulaglutide Other (See Comments)   Severe stomach pain   Sulfur Nausea And Vomiting   Metformin And Related Diarrhea      Medication List        Accurate as of 07/28/17 11:45 AM. Always use your most recent med list.          albuterol 108 (90 Base) MCG/ACT inhaler Commonly known as:  PROVENTIL HFA;VENTOLIN HFA Inhale 1-2 puffs into the lungs every 4 (four) hours as needed for wheezing or shortness of breath.   albuterol (2.5 MG/3ML) 0.083% nebulizer solution Commonly known as:  PROVENTIL Inhale the contents of one vial in nebulizer every four to six hours as needed for cough or wheeze.   AMBULATORY NON FORMULARY MEDICATION Compression stockings to knee or below knee, please measure  patient for appropriate size. Disp: as preferred by patient or insurance. Sig: apply daily to prevent lower extremity swelling. Dx: dependent edema   atorvastatin 80 MG tablet Commonly known as:  LIPITOR Take 1 tablet (80 mg total) by mouth daily.   BIOTIN PO Take by mouth daily.   BLOOD GLUCOSE TEST STRIPS Strp 1 each by Other route 3 (three) times a day. Use as instructed   budesonide-formoterol 160-4.5 MCG/ACT inhaler Commonly known as:  SYMBICORT Inhale two puffs twice daily to prevent cough or wheeze.  Rinse, gargle, and spit after use.   cetirizine 10 MG tablet Commonly known as:  ZYRTEC Can take one tablet by mouth once or twice daily if needed.   clobetasol ointment 0.05 % Commonly known as:  TEMOVATE Apply 1 application topically 2 (two) times daily.   docusate sodium 100 MG capsule Commonly known as:  COLACE Take 1 capsule (100 mg total) by mouth 2 (two) times daily.   FIRST-DUKES MOUTHWASH Susp Use as directed 5-10 mLs in the mouth or throat 4 (four) times daily.   fluticasone 50 MCG/ACT nasal spray Commonly known as:  FLONASE Use two sprays in each nostril once daily as directed.   folic acid 1 MG tablet Commonly known as:  FOLVITE Take 1 mg by mouth daily.   gabapentin 400 MG capsule Commonly known as:  NEURONTIN Take 3 capsules (1,200 mg total) by mouth  3 (three) times daily. Max dose 3600 mg /24 hours   insulin aspart 100 UNIT/ML FlexPen Commonly known as:  NOVOLOG FLEXPEN Inject 40 Units into the skin daily with supper.   Insulin Glargine 100 UNIT/ML Solostar Pen Commonly known as:  LANTUS SOLOSTAR Inject 140 Units into the skin daily. And pen needles 5/day   Insulin Pen Needle 32G X 4 MM Misc by Does not apply route.   ipratropium 0.06 % nasal spray Commonly known as:  ATROVENT Place 2 sprays into both nostrils every 4 (four) hours as needed for rhinitis.   ipratropium-albuterol 0.5-2.5 (3) MG/3ML Soln Commonly known as:  DUONEB Take 3 mLs  by nebulization every 4 (four) hours as needed (wheeze, SOB).   meclizine 25 MG tablet Commonly known as:  ANTIVERT Take 1 tablet (25 mg total) by mouth 3 (three) times daily as needed for dizziness.   montelukast 10 MG tablet Commonly known as:  SINGULAIR Take 1 tablet (10 mg total) by mouth daily.   nystatin powder Commonly known as:  MYCOSTATIN/NYSTOP Apply 1 g topically 2 (two) times daily as needed.   omeprazole 40 MG capsule Commonly known as:  PRILOSEC Take 1 capsule (40 mg total) by mouth every morning.   ondansetron 8 MG disintegrating tablet Commonly known as:  ZOFRAN-ODT Dissolve 1 tablet (8 mg total) by mouth every 8 (eight) hours as needed for nausea.   oxyCODONE-acetaminophen 5-325 MG tablet Commonly known as:  PERCOCET/ROXICET Take 1 tablet by mouth at bedtime.   ranitidine 300 MG tablet Commonly known as:  ZANTAC Take 1 tablet (300 mg total) by mouth at bedtime.   tamsulosin 0.4 MG Caps capsule Commonly known as:  FLOMAX Take 0.4 mg by mouth daily.   valACYclovir 500 MG tablet Commonly known as:  VALTREX Take 1 tablet (500 mg total) by mouth at bedtime.   venlafaxine XR 37.5 MG 24 hr capsule Commonly known as:  EFFEXOR XR Take 1 capsule (37.5 mg total) by mouth daily.   Vitamin B-12 5000 MCG Tbdp Take 5,000 mcg by mouth daily.       Allergies:  Allergies  Allergen Reactions  . Dulaglutide Other (See Comments)    Severe stomach pain  . Sulfur Nausea And Vomiting  . Metformin And Related Diarrhea    Past Medical History, Surgical history, Social history, and Family History were reviewed and updated.  Review of Systems: All other 10 point review of systems is negative.   Physical Exam:  vitals were not taken for this visit.   Wt Readings from Last 3 Encounters:  07/11/17 275 lb 4.8 oz (124.9 kg)  07/08/17 277 lb (125.6 kg)  06/30/17 274 lb (124.3 kg)    Ocular: Sclerae unicteric, pupils equal, round and reactive to  light Ear-nose-throat: Oropharynx clear, dentition fair Lymphatic: No cervical, supraclavicular or axillary adenopathy Lungs no rales or rhonchi, good excursion bilaterally Heart regular rate and rhythm, no murmur appreciated Abd soft, nontender, positive bowel sounds, no liver or spleen tip palpated on exam, no fluid wave  MSK no focal spinal tenderness, no joint edema Neuro: non-focal, well-oriented, appropriate affect Breasts: Deferred   Lab Results  Component Value Date   WBC 21.6 (H) 07/28/2017   HGB 13.3 07/28/2017   HCT 42.7 07/28/2017   MCV 90.9 07/28/2017   PLT 390 07/28/2017   Lab Results  Component Value Date   FERRITIN 77 01/27/2017   IRON 51 01/27/2017   TIBC 283 01/27/2017   UIBC 232 01/27/2017   IRONPCTSAT  18 (L) 01/27/2017   Lab Results  Component Value Date   RBC 4.70 07/28/2017   No results found for: Nils Pyle, [facility] Lab Results  Component Value Date   IGGSERUM 675 (L) 11/10/2016   IGMSERUM 86 11/10/2016   No results found for: Odetta Pink, [facility]   Chemistry      Component Value Date/Time   NA 144 01/27/2017 1302   NA 138 09/16/2016 1105   K 4.3 01/27/2017 1302   K 4.2 09/16/2016 1105   CL 102 01/27/2017 1302   CO2 27 01/27/2017 1302   CO2 26 09/16/2016 1105   BUN 14 01/27/2017 1302   BUN 11.9 09/16/2016 1105   CREATININE 1.1 01/27/2017 1302   CREATININE 0.8 09/16/2016 1105      Component Value Date/Time   CALCIUM 9.5 01/27/2017 1302   CALCIUM 9.0 09/16/2016 1105   ALKPHOS 116 (H) 01/27/2017 1302   ALKPHOS 136 09/16/2016 1105   AST 17 01/27/2017 1302   AST 8 09/16/2016 1105   ALT 25 01/27/2017 1302   ALT 12 09/16/2016 1105   BILITOT 0.50 01/27/2017 1302   BILITOT 0.32 09/16/2016 1105      Impression and Plan: Tabitha Estrada is a very pleasant 47 yo caucasian female with reactive leukocytosis as well as iron deficiency.  She is still having problems with blood and  protein in her urine as well as pelvic pain and urinary urgency. Urology is planning to do a cystoscopy for further evaluation.  She is symptomatic with fatigue and chills. We will see what her iron studies show and bring her back in for infusion if needed.  We will go ahead and plan to see her back in another 3 months.  She will contact our office with any questions or concerns. We can certainly see her sooner if need be.   Laverna [name], NP 5/30/201911:45 AM

## 2017-07-29 LAB — FERRITIN: Ferritin: 124 ng/mL (ref 9–269)

## 2017-07-29 LAB — IRON AND TIBC
Iron: 45 ug/dL (ref 41–142)
SATURATION RATIOS: 16 % — AB (ref 21–57)
TIBC: 273 ug/dL (ref 236–444)
UIBC: 228 ug/dL

## 2017-08-08 ENCOUNTER — Encounter: Payer: Self-pay | Admitting: Podiatry

## 2017-08-08 ENCOUNTER — Ambulatory Visit: Payer: Medicaid Other | Admitting: Podiatry

## 2017-08-08 VITALS — BP 122/74 | HR 81 | Wt 268.0 lb

## 2017-08-08 DIAGNOSIS — L600 Ingrowing nail: Secondary | ICD-10-CM | POA: Diagnosis not present

## 2017-08-08 DIAGNOSIS — R234 Changes in skin texture: Secondary | ICD-10-CM

## 2017-08-08 DIAGNOSIS — E0842 Diabetes mellitus due to underlying condition with diabetic polyneuropathy: Secondary | ICD-10-CM | POA: Diagnosis not present

## 2017-08-08 DIAGNOSIS — B351 Tinea unguium: Secondary | ICD-10-CM | POA: Diagnosis not present

## 2017-08-08 NOTE — Progress Notes (Signed)
SUBJECTIVE: 47 y.o. year old female presents requesting calluses scraped and has issued with big toes.  Positive history of Plantar fasciitis right and onychomycosis in 2018. Currently diagnosed with IBS, Type 2 diabetes, Peripheral neuropathy, Recurrent herniated lumbar disc, chronic cough,   Review of Systems  Constitutional: Negative.   HENT: Negative.   Eyes: Negative.   Respiratory:       COPD for several years.  Cardiovascular: Negative.   Gastrointestinal:       IBS x since 1992.  Genitourinary:       Currently has Kidney stone x 2 months.  Musculoskeletal:       Lower back pain due to kidney stone. History of lower back surgery x 3, 2015, 2016, and 2018.  Skin: Negative.   Psychiatric/Behavioral: Positive for depression.     OBJECTIVE: DERMATOLOGIC EXAMINATION: Thick yellow discolored nail with ingrown hallucal nails bilateral. Painful fissured heel calluses bilateral.  VASCULAR EXAMINATION OF LOWER LIMBS: All pedal pulses are palpable with normal pulsation.  Capillary Filling times within 3 seconds in all digits.  No edema or erythema noted. Temperature gradient from tibial crest to dorsum of foot is within normal bilateral.  NEUROLOGIC EXAMINATION OF THE LOWER LIMBS: Diagnosed with peripheral neuropathy affecting right more than the left.  MUSCULOSKELETAL EXAMINATION: No gross deformities.  ASSESSMENT: Onychomycosis with onychocryptosis both great toes. Fissured heel callus bilateral. Diabetic neuropathy.  PLAN: Reviewed findings and available treatment options. Both heels fissured calluses debrided. All nails debrided. May return in 2 months.

## 2017-08-08 NOTE — Patient Instructions (Signed)
Seen for painful toes, calluses and hypertrophic nails. All nails and calluses debrided. Return in 2 months or as needed.

## 2017-08-09 ENCOUNTER — Ambulatory Visit: Payer: Medicaid Other | Admitting: Osteopathic Medicine

## 2017-08-11 ENCOUNTER — Ambulatory Visit: Payer: Medicaid Other | Admitting: Osteopathic Medicine

## 2017-08-15 ENCOUNTER — Telehealth: Payer: Self-pay | Admitting: Endocrinology

## 2017-08-15 ENCOUNTER — Other Ambulatory Visit: Payer: Self-pay

## 2017-08-15 MED ORDER — GLUCOSE BLOOD VI STRP: ORAL_STRIP | 12 refills | Status: DC

## 2017-08-15 NOTE — Telephone Encounter (Signed)
Need a refill for the tests strips for the contour meter

## 2017-08-15 NOTE — Telephone Encounter (Signed)
error 

## 2017-08-15 NOTE — Telephone Encounter (Signed)
I have sent to pharmacy for patient.  

## 2017-08-15 NOTE — Telephone Encounter (Signed)
Insulin Glargine (LANTUS SOLOSTAR) 100 UNIT/ML Solostar Pen  Patient stated her blood sugar has been 47.  She has been feeling very shaky and not feeling well. She would like to know if she should reduce her insulin Please advise

## 2017-08-16 ENCOUNTER — Ambulatory Visit: Payer: Medicaid Other

## 2017-08-16 NOTE — Telephone Encounter (Signed)
LVM for patient to call back so that I could give her decreased insulin dose.

## 2017-08-16 NOTE — Telephone Encounter (Signed)
Yes, please reduce the dose of Lantus to 100 units daily.  From what I can see in the chart, she is on 140 units daily now.  Please let us know about the sugars in 2 days.

## 2017-08-17 NOTE — Telephone Encounter (Signed)
I spoke with patient & she stated that she had reduced down to 60 units. She checked  Her blood sugar while on the phone with me & it was over 200. I asked that she go back up to the 100 units & let us know the beginning of next week how blood sugars are doing. She stated that she would do so.

## 2017-08-23 ENCOUNTER — Other Ambulatory Visit: Payer: Self-pay

## 2017-08-23 ENCOUNTER — Inpatient Hospital Stay: Payer: Medicaid Other | Attending: Family

## 2017-08-23 VITALS — BP 132/71 | HR 67 | Temp 98.0°F | Resp 18

## 2017-08-23 DIAGNOSIS — D72823 Leukemoid reaction: Secondary | ICD-10-CM | POA: Insufficient documentation

## 2017-08-23 DIAGNOSIS — D508 Other iron deficiency anemias: Secondary | ICD-10-CM | POA: Diagnosis present

## 2017-08-23 DIAGNOSIS — D72828 Other elevated white blood cell count: Secondary | ICD-10-CM | POA: Diagnosis not present

## 2017-08-23 MED ORDER — SODIUM CHLORIDE 0.9% FLUSH
10.0000 mL | INTRAVENOUS | Status: DC | PRN
  Filled 2017-08-23: qty 10

## 2017-08-23 MED ORDER — SODIUM CHLORIDE 0.9% FLUSH
3.0000 mL | Freq: Once | INTRAVENOUS | Status: DC | PRN
  Filled 2017-08-23: qty 10

## 2017-08-23 MED ORDER — FERUMOXYTOL INJECTION 510 MG/17 ML
510.0000 mg | Freq: Once | INTRAVENOUS | Status: AC
  Administered 2017-08-23: 510 mg via INTRAVENOUS
  Filled 2017-08-23: qty 17

## 2017-08-23 NOTE — Progress Notes (Signed)
Patient refused to 30 min post infusion. Released stable and ASX.

## 2017-08-23 NOTE — Patient Instructions (Signed)

## 2017-08-24 ENCOUNTER — Ambulatory Visit (INDEPENDENT_AMBULATORY_CARE_PROVIDER_SITE_OTHER): Payer: Medicaid Other | Admitting: Osteopathic Medicine

## 2017-08-24 VITALS — BP 122/80 | HR 81 | Temp 98.2°F | Wt 276.5 lb

## 2017-08-24 DIAGNOSIS — E1142 Type 2 diabetes mellitus with diabetic polyneuropathy: Secondary | ICD-10-CM | POA: Diagnosis not present

## 2017-08-24 DIAGNOSIS — N924 Excessive bleeding in the premenopausal period: Secondary | ICD-10-CM

## 2017-08-24 MED ORDER — PREGABALIN 150 MG PO CAPS
150.0000 mg | ORAL_CAPSULE | Freq: Two times a day (BID) | ORAL | 0 refills | Status: DC
Start: 2017-08-24 — End: 2017-09-13

## 2017-08-24 NOTE — Patient Instructions (Signed)
   Call me in 2-3 weeks and let me know how they Lyrica is working  When you're able to get it from the pharmacy, stop gabapentin and switch over to the Lyrica

## 2017-08-24 NOTE — Progress Notes (Signed)
HPI: Tabitha Estrada is a 47 y.o. female who  has a past medical history of Allergy, Anemia, Anxiety, Arthritis, Asthma, COPD (chronic obstructive pulmonary disease) ([place]), Depression, Diabetes mellitus without complication ([place]), Dyspnea, Fibromyalgia, GERD (gastroesophageal reflux disease), History of hiatal hernia, History of kidney stones, Hyperlipidemia, Neuromuscular disorder ([place]), Pneumonia, and Sleep apnea.  she presents to [facility] today, 08/24/17,  for chief complaint of:  Recheck perimenopausal problems   Mood swings w/ occasional hot flashes. We started w/ effexor 37.5 mg.  She was on this for about 2 weeks but did not really feel like it was helping so this was stopped.  She is not interested at this time in hormone replacement therapy or restarting the Effexor with continued dosing/titration up after a few weeks.  Type 2 diabetes: Stable, following with endocrinology  Greatest complaint today is numbness/tingling in both feet.  She is already on maximum dosage of gabapentin.  Tingling/burning sensation is constant and has been ongoing for several months.  Also reports persistent heavy vaginal bleeding, would like to discuss possible hysterectomy or ablation or something to fix it.     Past medical history, surgical history, and family history reviewed.  Current medication list and allergy/intolerance information reviewed.   (See remainder of HPI, ROS, Phys Exam below)    ASSESSMENT/PLAN:   Diabetic polyneuropathy associated with type 2 diabetes mellitus ([place]) - Trial of Lyrica in place of gabapentin  Perimenopausal menorrhagia - Plan: Ambulatory referral to Obstetrics / Gynecology -would also appreciate their input on other treatment for perimenopausal mood symptoms, consider hormone replacement therapy or other SSRI treatment, other    Meds ordered this encounter  Medications  . pregabalin (LYRICA) 150 MG capsule    Sig: Take 1  capsule (150 mg total) by mouth 2 (two) times daily.    Dispense:  60 capsule    Refill:  0    Patient Instructions   Call me in 2-3 weeks and let me know how they Lyrica is working  When you're able to get it from the pharmacy, stop gabapentin and switch over to the Lyrica    Follow-up plan: Return in about 3 months (around 11/24/2017) for routine check-up, sooner if needed .     ############################################ ############################################ ############################################ ############################################    Outpatient Encounter Medications as of 08/24/2017  Medication Sig Note  . albuterol (PROVENTIL HFA;VENTOLIN HFA) 108 (90 Base) MCG/ACT inhaler Inhale 1-2 puffs into the lungs every 4 (four) hours as needed for wheezing or shortness of breath.   Marland Kitchen albuterol (PROVENTIL) (2.5 MG/3ML) 0.083% nebulizer solution Inhale the contents of one vial in nebulizer every four to six hours as needed for cough or wheeze.   Marland Kitchen AMBULATORY NON FORMULARY MEDICATION Compression stockings to knee or below knee, please measure patient for appropriate size. Disp: as preferred by patient or insurance. Sig: apply daily to prevent lower extremity swelling. Dx: dependent edema   . atorvastatin (LIPITOR) 80 MG tablet Take 1 tablet (80 mg total) by mouth daily.   Marland Kitchen BIOTIN PO Take by mouth daily.   . budesonide-formoterol (SYMBICORT) 160-4.5 MCG/ACT inhaler Inhale two puffs twice daily to prevent cough or wheeze.  Rinse, gargle, and spit after use.   . cetirizine (ZYRTEC) 10 MG tablet Can take one tablet by mouth once or twice daily if needed.   . clobetasol ointment (TEMOVATE) 2.95 % Apply 1 application topically 2 (two) times daily. 11/09/2016: Taking PRN  . Cyanocobalamin (VITAMIN B-12) 5000 MCG TBDP  Take 5,000 mcg by mouth daily.    . Diphenhyd-Hydrocort-Nystatin (FIRST-DUKES MOUTHWASH) SUSP Use as directed 5-10 mLs in the mouth or throat 4 (four) times  daily.   Marland Kitchen docusate sodium (COLACE) 100 MG capsule Take 1 capsule (100 mg total) by mouth 2 (two) times daily. 11/09/2016: Taking PRN.  . fluticasone (FLONASE) 50 MCG/ACT nasal spray Use two sprays in each nostril once daily as directed.   . folic acid (FOLVITE) 1 MG tablet Take 1 mg by mouth daily.   Marland Kitchen gabapentin (NEURONTIN) 400 MG capsule Take 3 capsules (1,200 mg total) by mouth 3 (three) times daily. Max dose 3600 mg /24 hours   . Glucose Blood (BLOOD GLUCOSE TEST STRIPS) STRP 1 each by Other route 3 (three) times a day. Use as instructed   . glucose blood (CONTOUR NEXT TEST) test strip Used to check blood sugars tree times daily.   . insulin aspart (NOVOLOG FLEXPEN) 100 UNIT/ML FlexPen Inject 40 Units into the skin daily with supper.   . Insulin Glargine (LANTUS SOLOSTAR) 100 UNIT/ML Solostar Pen Inject 140 Units into the skin daily. And pen needles 5/day   . Insulin Pen Needle 32G X 4 MM MISC by Does not apply route.   Marland Kitchen ipratropium (ATROVENT) 0.06 % nasal spray Place 2 sprays into both nostrils every 4 (four) hours as needed for rhinitis.   Marland Kitchen ipratropium-albuterol (DUONEB) 0.5-2.5 (3) MG/3ML SOLN Take 3 mLs by nebulization every 4 (four) hours as needed (wheeze, SOB).   . meclizine (ANTIVERT) 25 MG tablet Take 1 tablet (25 mg total) by mouth 3 (three) times daily as needed for dizziness.   . montelukast (SINGULAIR) 10 MG tablet Take 1 tablet (10 mg total) by mouth daily.   Marland Kitchen nystatin (MYCOSTATIN/NYSTOP) powder Apply 1 g topically 2 (two) times daily as needed.   Marland Kitchen omeprazole (PRILOSEC) 40 MG capsule Take 1 capsule (40 mg total) by mouth every morning.   . ondansetron (ZOFRAN-ODT) 8 MG disintegrating tablet Dissolve 1 tablet (8 mg total) by mouth every 8 (eight) hours as needed for nausea.   Marland Kitchen oxyCODONE-acetaminophen (PERCOCET/ROXICET) 5-325 MG tablet Take by mouth.   . ranitidine (ZANTAC) 300 MG tablet Take 1 tablet (300 mg total) by mouth at bedtime.   . tamsulosin (FLOMAX) 0.4 MG CAPS  capsule Take 0.4 mg by mouth.   . valACYclovir (VALTREX) 500 MG tablet Take 1 tablet (500 mg total) by mouth at bedtime.   Marland Kitchen venlafaxine XR (EFFEXOR XR) 37.5 MG 24 hr capsule Take 1 capsule (37.5 mg total) by mouth daily.   . [DISCONTINUED] sodium chloride flush (NS) 0.9 % injection 10 mL    . [DISCONTINUED] sodium chloride flush (NS) 0.9 % injection 3 mL     No facility-administered encounter medications on file as of 08/24/2017.    Allergies  Allergen Reactions  . Dulaglutide Other (See Comments)    Severe stomach pain  . Sulfur Nausea And Vomiting  . Metformin And Related Diarrhea      Review of Systems:  Constitutional: No recent illness  HEENT: No  headache, no vision change  Cardiac: No  chest pain, No  pressure, No palpitations  Respiratory:  No  shortness of breath.   Musculoskeletal: No new myalgia/arthralgia  Skin: No  Rash  Hem/Onc: No  easy bruising/bleeding  Neurologic: No  weakness, No  Dizziness   Exam:  BP 122/80 (BP Location: Left Arm, Patient Position: Sitting, Cuff Size: Large)   Pulse 81   Temp 98.2 F (36.8  C) (Oral)   Wt 276 lb 8 oz (125.4 kg)   BMI 46.01 kg/m   Constitutional: VS see above. General Appearance: alert, well-developed, well-nourished, NAD  Eyes: Normal lids and conjunctive, non-icteric sclera  Ears, Nose, Mouth, Throat: MMM, Normal external inspection ears/nares/mouth/lips/gums.  Neck: No masses, trachea midline.   Respiratory: Normal respiratory effort. no wheeze, no rhonchi, no rales  Cardiovascular: S1/S2 normal, no murmur, no rub/gallop auscultated. RRR.   Musculoskeletal: Gait normal. Symmetric and independent movement of all extremities  Neurological: Normal balance/coordination. No tremor.  Skin: warm, dry, intact, feet were examined and skin appears normal  Psychiatric: Normal judgment/insight. Normal mood and affect. Oriented x3.   Visit summary with medication list and pertinent instructions was printed for  patient to review, advised to alert [place] if any changes needed. All questions at time of visit were answered - patient instructed to contact office with any additional concerns. ER/RTC precautions were reviewed with the patient and understanding verbalized.   Follow-up plan: Return in about 3 months (around 11/24/2017) for routine check-up, sooner if needed .  Note: Total time spent 25 minutes, greater than 50% of the visit was spent face-to-face counseling and coordinating care for the following: The primary encounter diagnosis was Diabetic polyneuropathy associated with type 2 diabetes mellitus ([place]). A diagnosis of Perimenopausal menorrhagia was also pertinent to this visit.Marland Kitchen  Please note: voice recognition software was used to produce this document, and typos may escape review. Please contact Dr. Sheppard Coil for any needed clarifications.

## 2017-08-25 ENCOUNTER — Encounter: Payer: Self-pay | Admitting: Osteopathic Medicine

## 2017-09-02 ENCOUNTER — Other Ambulatory Visit: Payer: Self-pay | Admitting: Osteopathic Medicine

## 2017-09-02 DIAGNOSIS — N951 Menopausal and female climacteric states: Secondary | ICD-10-CM

## 2017-09-02 NOTE — Telephone Encounter (Signed)
[facility] requesting med RF for venlafaxine 37.5 mg. It appears that med is on pt's inactive list. Pls advise, thanks.

## 2017-09-06 ENCOUNTER — Ambulatory Visit: Payer: Medicaid Other | Admitting: Podiatry

## 2017-09-08 ENCOUNTER — Telehealth: Payer: Self-pay | Admitting: Emergency Medicine

## 2017-09-08 NOTE — Telephone Encounter (Signed)
Called patient, unable to reach left message to give us a call back. 

## 2017-09-08 NOTE — Telephone Encounter (Signed)
Is she still taking an anti-histamine (loratadine etc), is she doing nasal saline rinses (we have tried this in the past I believe)? Not much for me to add that we have used in the past - I'm not sure what she's on right now

## 2017-09-08 NOTE — Telephone Encounter (Signed)
Spoke with pt. States that she is not feeling. Reports chest congestion, cough and sinus congestion. Cough is tight and she can't move any secretions. Denies chest tightness, wheezing, SOB or fever. Symptoms started 2-3 days ago. Pt declined appointment. She would like to have something sent in.  RB - please advise. Thanks.

## 2017-09-09 NOTE — Telephone Encounter (Signed)
Spoke with pt. She is still currently taking Claritin and doing nasal saline washes. Pt is aware that RB does not have any further recommendations at this time. Nothing further was needed.

## 2017-09-09 NOTE — Telephone Encounter (Signed)
Patient returned phone call, call disconnected

## 2017-09-09 NOTE — Telephone Encounter (Signed)
Attempted to call pt. I did not receive an answer. I have left a message for pt to return our call.  

## 2017-09-12 NOTE — Telephone Encounter (Signed)
Pharmacy has been updated.

## 2017-09-13 ENCOUNTER — Encounter: Payer: Self-pay | Admitting: Osteopathic Medicine

## 2017-09-13 MED ORDER — PREGABALIN 200 MG PO CAPS
200.0000 mg | ORAL_CAPSULE | Freq: Two times a day (BID) | ORAL | 0 refills | Status: DC
Start: 2017-09-13 — End: 2017-10-07

## 2017-09-15 ENCOUNTER — Ambulatory Visit: Payer: Medicaid Other | Admitting: Podiatry

## 2017-09-15 DIAGNOSIS — E0842 Diabetes mellitus due to underlying condition with diabetic polyneuropathy: Secondary | ICD-10-CM

## 2017-09-15 DIAGNOSIS — R234 Changes in skin texture: Secondary | ICD-10-CM

## 2017-09-15 NOTE — Patient Instructions (Signed)
Seen for diabetic shoe order. Both feet measured for diabetic shoes. Fissured heel calluses debrided. Return in 3 month for routine foot care.

## 2017-09-15 NOTE — Progress Notes (Signed)
Subjective: 47 y.o. year old female patient presents requesting diabetic shoes.  Review of Systems  Constitutional: Negative.   HENT: Negative.   Eyes: Negative.   Respiratory:       COPD for several years.  Cardiovascular: Negative.   Gastrointestinal:       IBS x since 1992.  Genitourinary:       Currently has Kidney stone x 2 months.  Musculoskeletal:       Lower back pain due to kidney stone. History of lower back surgery x 3, 2015, 2016, and 2018.  Skin: Negative.   Psychiatric/Behavioral: Positive for depression.     OBJECTIVE: DERMATOLOGIC EXAMINATION: Thick fissured heel calluses painful bilateral. VASCULAR EXAMINATION OF LOWER LIMBS: All pedal pulses palpable bilateral. NEUROLOGIC EXAMINATION OF THE LOWER LIMBS: Peripheral neuropathy L>R. MUSCULOSKELETAL EXAMINATION: No gross deformities.  Assessment: Fissured heel callus left foot. Diabetic poly neuropathy.  Treatment: Both feet measured for diabetic shoes. Both heel fissured calluses debrided. Return in 3 months or as needed.

## 2017-09-16 ENCOUNTER — Encounter: Payer: Self-pay | Admitting: Podiatry

## 2017-09-19 ENCOUNTER — Other Ambulatory Visit: Payer: Self-pay | Admitting: Osteopathic Medicine

## 2017-09-19 NOTE — Telephone Encounter (Signed)
[facility] pharmacy requesting med RF for pregabalin.

## 2017-09-19 NOTE — Telephone Encounter (Signed)
Left a detailed vm msg for pt regarding med RF. Call back information provided.

## 2017-09-23 ENCOUNTER — Ambulatory Visit: Payer: Medicaid Other | Admitting: Endocrinology

## 2017-09-29 ENCOUNTER — Telehealth: Payer: Self-pay | Admitting: *Deleted

## 2017-09-29 NOTE — Telephone Encounter (Signed)
Patient tried on shoes in the office. Shoes were dispensed today

## 2017-10-03 ENCOUNTER — Ambulatory Visit: Payer: Medicaid Other | Admitting: Podiatry

## 2017-10-07 ENCOUNTER — Encounter: Payer: Self-pay | Admitting: Sports Medicine

## 2017-10-07 ENCOUNTER — Ambulatory Visit: Payer: Medicaid Other | Admitting: Sports Medicine

## 2017-10-07 ENCOUNTER — Other Ambulatory Visit: Payer: Self-pay | Admitting: Osteopathic Medicine

## 2017-10-07 DIAGNOSIS — J449 Chronic obstructive pulmonary disease, unspecified: Secondary | ICD-10-CM

## 2017-10-07 MED ORDER — PREDNISONE 50 MG PO TABS: 50.0000 mg | ORAL_TABLET | Freq: Every day | ORAL | 0 refills | Status: DC

## 2017-10-07 MED ORDER — AZITHROMYCIN 250 MG PO TABS: ORAL_TABLET | ORAL | 0 refills | Status: DC

## 2017-10-07 NOTE — Progress Notes (Signed)
Subjective:    CC: Feeling sick  HPI: This is a pleasant 47 year old female, for the past several weeks she is had frontal sinus pain and pressure, headaches, occasional dizziness.  Mild nausea.  She went to the emergency department, labs were normal, urinalysis showed some high specific gravity but otherwise normal, head CT was normal, chest x-ray normal.  She was given some nausea medicine and discharge.  She also had a mild cough.  She is here with persistence of symptoms, mostly cough, and frontal/facial sinus pain and pressure.  Has not had any antibiotics yet.  Symptoms are moderate, improving.  I reviewed the past medical history, family history, social history, surgical history, and allergies today and no changes were needed.  Please see the problem list section below in epic for further details.  Past Medical History: Past Medical History:  Diagnosis Date  . Allergy   . Anemia   . Anxiety   . Arthritis   . Asthma   . COPD (chronic obstructive pulmonary disease) ([place])   . Depression   . Diabetes mellitus without complication ([place])   . Dyspnea    with exertion and bronchititis  . Fibromyalgia   . GERD (gastroesophageal reflux disease)   . History of hiatal hernia   . History of kidney stones   . Hyperlipidemia   . Neuromuscular disorder ([place])   . Pneumonia    hs, 01-19-15  . Sleep apnea    has Cpap have not been using   Past Surgical History: Past Surgical History:  Procedure Laterality Date  . BACK SURGERY  2015,2016, 2018  . CARPAL TUNNEL RELEASE Right 2008  . CHOLECYSTECTOMY  1992  . KIDNEY STONE SURGERY  2015  . TONSILLECTOMY  1979  . TUBAL LIGATION  2005   Social History: Social History   Socioeconomic History  . Marital status: Married    Spouse name: Not on file  . Number of children: Not on file  . Years of education: Not on file  . Highest education level: Not on file  Occupational History  . Not on file  Social Needs  . Financial resource  strain: Not on file  . Food insecurity:    Worry: Not on file    Inability: Not on file  . Transportation needs:    Medical: Not on file    Non-medical: Not on file  Tobacco Use  . Smoking status: Current Every Day Smoker    Packs/day: 1.00    Years: 33.00    Pack years: 33.00    Types: Cigarettes  . Smokeless tobacco: Never Used  . Tobacco comment: 05/17/2016 still smokes  Substance and Sexual Activity  . Alcohol use: No  . Drug use: No  . Sexual activity: Not on file  Lifestyle  . Physical activity:    Days per week: Not on file    Minutes per session: Not on file  . Stress: Not on file  Relationships  . Social connections:    Talks on phone: Not on file    Gets together: Not on file    Attends religious service: Not on file    Active member of club or organization: Not on file    Attends meetings of clubs or organizations: Not on file    Relationship status: Not on file  Other Topics Concern  . Not on file  Social History Narrative  . Not on file   Family History: Family History  Problem Relation Age of Onset  .  Diabetes Father   . Hyperlipidemia Father   . Hypertension Father   . Diabetes Paternal Aunt   . Hypertension Paternal Aunt   . Heart attack Paternal Uncle   . Emphysema Maternal Grandmother   . Heart failure Maternal Grandmother   . Cancer Maternal Grandfather   . Colon cancer Neg Hx    Allergies: Allergies  Allergen Reactions  . Dulaglutide Other (See Comments)    Severe stomach pain  . Sulfur Nausea And Vomiting  . Metformin And Related Diarrhea   Medications: See med rec.  Review of Systems: No fevers, chills, night sweats, weight loss, chest pain, or shortness of breath.   Objective:    General: Well Developed, well nourished, and in no acute distress.  Neuro: Alert and oriented x3, extra-ocular muscles intact, sensation grossly intact.  HEENT: Normocephalic, atraumatic, pupils equal round reactive to light, neck supple, no masses, no  lymphadenopathy, thyroid nonpalpable.  Oropharynx, nasopharynx, ear canals unremarkable, tender to palpation over the frontal sinuses. Skin: Warm and dry, no rashes. Cardiac: Regular rate and rhythm, no murmurs rubs or gallops, no lower extremity edema.  Respiratory: Coarse breath sounds with diffuse expiratory wheezes.  Not using accessory muscles, speaking in full sentences.  Impression and Recommendations:    COPD mixed type ([place]) Currently in exacerbation with acute frontal sinusitis. Adding azithromycin, prednisone.   Return to see me next week. She does have an appointment coming up with her pulmonologist next week as well. ___________________________________________ Gwen Her. Dianah Field, M.D., ABFM., CAQSM. Primary Care and [place] Instructor of [place] of [facility] of Medicine

## 2017-10-07 NOTE — Assessment & Plan Note (Signed)
Currently in exacerbation with acute frontal sinusitis. Adding azithromycin, prednisone.   Return to see me next week. She does have an appointment coming up with her pulmonologist next week as well.

## 2017-10-10 ENCOUNTER — Ambulatory Visit: Payer: Medicaid Other | Admitting: Emergency Medicine

## 2017-10-14 ENCOUNTER — Ambulatory Visit (INDEPENDENT_AMBULATORY_CARE_PROVIDER_SITE_OTHER): Payer: Medicaid Other | Admitting: Sports Medicine

## 2017-10-14 ENCOUNTER — Encounter: Payer: Self-pay | Admitting: Sports Medicine

## 2017-10-14 DIAGNOSIS — J449 Chronic obstructive pulmonary disease, unspecified: Secondary | ICD-10-CM | POA: Diagnosis not present

## 2017-10-14 MED ORDER — FLUTICASONE FUROATE-VILANTEROL 100-25 MCG/INH IN AEPB: 1.0000 | INHALATION_SPRAY | Freq: Every day | RESPIRATORY_TRACT | 11 refills | Status: DC

## 2017-10-14 MED ORDER — AMOXICILLIN-POT CLAVULANATE 875-125 MG PO TABS: 1.0000 | ORAL_TABLET | Freq: Two times a day (BID) | ORAL | 0 refills | Status: AC

## 2017-10-14 NOTE — Progress Notes (Signed)
Subjective:    CC: Follow-up  HPI: Frontal sinusitis: With COPD exacerbation, mild improvement with azithromycin and prednisone, some persistent symptoms including expected persistent cough.  She does have trouble using Symbicort as directed due to its frequency of dosing.  I reviewed the past medical history, family history, social history, surgical history, and allergies today and no changes were needed.  Please see the problem list section below in epic for further details.  Past Medical History: Past Medical History:  Diagnosis Date  . Allergy   . Anemia   . Anxiety   . Arthritis   . Asthma   . COPD (chronic obstructive pulmonary disease) ([place])   . Depression   . Diabetes mellitus without complication ([place])   . Dyspnea    with exertion and bronchititis  . Fibromyalgia   . GERD (gastroesophageal reflux disease)   . History of hiatal hernia   . History of kidney stones   . Hyperlipidemia   . Neuromuscular disorder ([place])   . Pneumonia    hs, 01-19-15  . Sleep apnea    has Cpap have not been using   Past Surgical History: Past Surgical History:  Procedure Laterality Date  . BACK SURGERY  2015,2016, 2018  . CARPAL TUNNEL RELEASE Right 2008  . CHOLECYSTECTOMY  1992  . KIDNEY STONE SURGERY  2015  . TONSILLECTOMY  1979  . TUBAL LIGATION  2005   Social History: Social History   Socioeconomic History  . Marital status: Married    Spouse name: Not on file  . Number of children: Not on file  . Years of education: Not on file  . Highest education level: Not on file  Occupational History  . Not on file  Social Needs  . Financial resource strain: Not on file  . Food insecurity:    Worry: Not on file    Inability: Not on file  . Transportation needs:    Medical: Not on file    Non-medical: Not on file  Tobacco Use  . Smoking status: Current Every Day Smoker    Packs/day: 1.00    Years: 33.00    Pack years: 33.00    Types: Cigarettes  . Smokeless tobacco:  Never Used  . Tobacco comment: 05/17/2016 still smokes  Substance and Sexual Activity  . Alcohol use: No  . Drug use: No  . Sexual activity: Not on file  Lifestyle  . Physical activity:    Days per week: Not on file    Minutes per session: Not on file  . Stress: Not on file  Relationships  . Social connections:    Talks on phone: Not on file    Gets together: Not on file    Attends religious service: Not on file    Active member of club or organization: Not on file    Attends meetings of clubs or organizations: Not on file    Relationship status: Not on file  Other Topics Concern  . Not on file  Social History Narrative  . Not on file   Family History: Family History  Problem Relation Age of Onset  . Diabetes Father   . Hyperlipidemia Father   . Hypertension Father   . Diabetes Paternal Aunt   . Hypertension Paternal Aunt   . Heart attack Paternal Uncle   . Emphysema Maternal Grandmother   . Heart failure Maternal Grandmother   . Cancer Maternal Grandfather   . Colon cancer Neg Hx    Allergies: Allergies  Allergen Reactions  . Dulaglutide Other (See Comments)    Severe stomach pain  . Sulfur Nausea And Vomiting  . Metformin And Related Diarrhea   Medications: See med rec.  Review of Systems: No fevers, chills, night sweats, weight loss, chest pain, or shortness of breath.   Objective:    General: Well Developed, well nourished, and in no acute distress.  Neuro: Alert and oriented x3, extra-ocular muscles intact, sensation grossly intact.  HEENT: Normocephalic, atraumatic, pupils equal round reactive to light, neck supple, no masses, no lymphadenopathy, thyroid nonpalpable.  Skin: Warm and dry, no rashes. Cardiac: Regular rate and rhythm, no murmurs rubs or gallops, no lower extremity edema.  Respiratory: Persistent diffuse wheezes. Not using accessory muscles, speaking in full sentences.  Impression and Recommendations:    COPD mixed type ([place]) Mild  exacerbation, although better than the previous visit. Persistent frontal sinusitis symptoms. Switching to Augmentin, and am also going to switch her from Symbicort to [facility]. She does not use Symbicort as directed due to its frequency of dosing.  I spent 25 minutes with this patient, greater than 50% was face-to-face time counseling regarding the above diagnoses ___________________________________________ Gwen Her. Dianah Field, M.D., ABFM., CAQSM. Primary Care and [place] Instructor of [place] of [facility]

## 2017-10-14 NOTE — Assessment & Plan Note (Signed)
Mild exacerbation, although better than the previous visit. Persistent frontal sinusitis symptoms. Switching to Augmentin, and am also going to switch her from Symbicort to [facility]. She does not use Symbicort as directed due to its frequency of dosing.

## 2017-10-19 ENCOUNTER — Encounter: Payer: Self-pay | Admitting: Sports Medicine

## 2017-10-20 MED ORDER — FLUCONAZOLE 150 MG PO TABS: 150.0000 mg | ORAL_TABLET | Freq: Once | ORAL | 0 refills | Status: AC

## 2017-10-24 ENCOUNTER — Other Ambulatory Visit: Payer: Self-pay | Admitting: Sports Medicine

## 2017-10-26 ENCOUNTER — Ambulatory Visit: Payer: Medicaid Other | Admitting: Endocrinology

## 2017-10-28 ENCOUNTER — Other Ambulatory Visit: Payer: Medicaid Other

## 2017-10-28 ENCOUNTER — Ambulatory Visit: Payer: Medicaid Other | Admitting: Hematology & Oncology

## 2017-11-01 ENCOUNTER — Ambulatory Visit (INDEPENDENT_AMBULATORY_CARE_PROVIDER_SITE_OTHER): Payer: Medicaid Other | Admitting: Sports Medicine

## 2017-11-01 ENCOUNTER — Encounter: Payer: Self-pay | Admitting: Sports Medicine

## 2017-11-01 DIAGNOSIS — F17219 Nicotine dependence, cigarettes, with unspecified nicotine-induced disorders: Secondary | ICD-10-CM | POA: Diagnosis not present

## 2017-11-01 DIAGNOSIS — J449 Chronic obstructive pulmonary disease, unspecified: Secondary | ICD-10-CM | POA: Diagnosis not present

## 2017-11-01 MED ORDER — VARENICLINE TARTRATE 0.5 MG X 11 & 1 MG X 42 PO MISC: ORAL | 0 refills | Status: DC

## 2017-11-01 MED ORDER — BUDESONIDE-FORMOTEROL FUMARATE 160-4.5 MCG/ACT IN AERO: 2.0000 | INHALATION_SPRAY | Freq: Two times a day (BID) | RESPIRATORY_TRACT | 11 refills | Status: AC

## 2017-11-01 MED ORDER — VARENICLINE TARTRATE 1 MG PO TABS: 1.0000 mg | ORAL_TABLET | Freq: Two times a day (BID) | ORAL | 3 refills | Status: DC

## 2017-11-01 NOTE — Progress Notes (Signed)
Subjective:    CC: Recheck breathing  HPI: This is a 47 year old female with COPD, retreated her for exacerbation at the last visit, she was unable to get [facility] so she went back to Symbicort but has been somewhat lackadaisical with her compliance, only using it occasionally and once per day.  She also continues to smoke about 1 pack/day.  Persistent cough, although improving slightly, no fevers, chills, shortness of breath, constitutional symptoms.  I reviewed the past medical history, family history, social history, surgical history, and allergies today and no changes were needed.  Please see the problem list section below in epic for further details.  Past Medical History: Past Medical History:  Diagnosis Date  . Allergy   . Anemia   . Anxiety   . Arthritis   . Asthma   . COPD (chronic obstructive pulmonary disease) ([place])   . Depression   . Diabetes mellitus without complication ([place])   . Dyspnea    with exertion and bronchititis  . Fibromyalgia   . GERD (gastroesophageal reflux disease)   . History of hiatal hernia   . History of kidney stones   . Hyperlipidemia   . Neuromuscular disorder ([place])   . Pneumonia    hs, 01-19-15  . Sleep apnea    has Cpap have not been using   Past Surgical History: Past Surgical History:  Procedure Laterality Date  . BACK SURGERY  2015,2016, 2018  . CARPAL TUNNEL RELEASE Right 2008  . CHOLECYSTECTOMY  1992  . KIDNEY STONE SURGERY  2015  . TONSILLECTOMY  1979  . TUBAL LIGATION  2005   Social History: Social History   Socioeconomic History  . Marital status: Married    Spouse name: Not on file  . Number of children: Not on file  . Years of education: Not on file  . Highest education level: Not on file  Occupational History  . Not on file  Social Needs  . Financial resource strain: Not on file  . Food insecurity:    Worry: Not on file    Inability: Not on file  . Transportation needs:    Medical: Not on file    Non-medical:  Not on file  Tobacco Use  . Smoking status: Current Every Day Smoker    Packs/day: 1.00    Years: 33.00    Pack years: 33.00    Types: Cigarettes  . Smokeless tobacco: Never Used  . Tobacco comment: 05/17/2016 still smokes  Substance and Sexual Activity  . Alcohol use: No  . Drug use: No  . Sexual activity: Not on file  Lifestyle  . Physical activity:    Days per week: Not on file    Minutes per session: Not on file  . Stress: Not on file  Relationships  . Social connections:    Talks on phone: Not on file    Gets together: Not on file    Attends religious service: Not on file    Active member of club or organization: Not on file    Attends meetings of clubs or organizations: Not on file    Relationship status: Not on file  Other Topics Concern  . Not on file  Social History Narrative  . Not on file   Family History: Family History  Problem Relation Age of Onset  . Diabetes Father   . Hyperlipidemia Father   . Hypertension Father   . Diabetes Paternal Aunt   . Hypertension Paternal Aunt   . Heart attack  Paternal Uncle   . Emphysema Maternal Grandmother   . Heart failure Maternal Grandmother   . Cancer Maternal Grandfather   . Colon cancer Neg Hx    Allergies: Allergies  Allergen Reactions  . Dulaglutide Other (See Comments)    Severe stomach pain  . Sulfur Nausea And Vomiting  . Metformin And Related Diarrhea   Medications: See med rec.  Review of Systems: No fevers, chills, night sweats, weight loss, chest pain, or shortness of breath.   Objective:    General: Well Developed, well nourished, and in no acute distress.  Neuro: Alert and oriented x3, extra-ocular muscles intact, sensation grossly intact.  HEENT: Normocephalic, atraumatic, pupils equal round reactive to light, neck supple, no masses, no lymphadenopathy, thyroid nonpalpable.  Skin: Warm and dry, no rashes. Cardiac: Regular rate and rhythm, no murmurs rubs or gallops, no lower extremity  edema.  Respiratory: Coarse sounds with diffuse expiratory wheezes, overall good air movement. Not using accessory muscles, speaking in full sentences.  Impression and Recommendations:    COPD mixed type ([place]) Continued mild exacerbation. Was unable to get Breo. Has restarted Symbicort, still using it only once daily. Refilling this, I described the importance of using this twice a day. Were also going to add Chantix for smoking cessation. Understand that we will never fully be able to get her symptoms under control as long she is smoking and not using her medication as directed. Risks, benefits explained.  Dependence on nicotine from cigarettes Getting Chantix  I spent 25 minutes with this patient, greater than 50% was face-to-face time counseling regarding the above diagnoses ___________________________________________ Gwen Her. Dianah Field, M.D., ABFM., CAQSM. Primary Care and [place] Instructor of [place] of [facility] of Medicine

## 2017-11-01 NOTE — Assessment & Plan Note (Signed)
Getting Chantix

## 2017-11-01 NOTE — Patient Instructions (Signed)
Varenicline oral tablets What is this medicine? VARENICLINE (var EN i kleen) is used to help people quit smoking. It can reduce the symptoms caused by stopping smoking. It is used with a patient support program recommended by your physician. This medicine may be used for other purposes; ask your health care provider or pharmacist if you have questions. COMMON BRAND NAME(S): Chantix What should I tell my health care provider before I take this medicine? They need to know if you have any of these conditions: -bipolar disorder, depression, schizophrenia or other mental illness -heart disease -if you often drink alcohol -kidney disease -peripheral vascular disease -seizures -stroke -suicidal thoughts, plans, or attempt; a previous suicide attempt by you or a family member -an unusual or allergic reaction to varenicline, other medicines, foods, dyes, or preservatives -pregnant or trying to get pregnant -breast-feeding How should I use this medicine? Take this medicine by mouth after eating. Take with a full glass of water. Follow the directions on the prescription label. Take your doses at regular intervals. Do not take your medicine more often than directed. There are 3 ways you can use this medicine to help you quit smoking; talk to your health care professional to decide which plan is right for you: 1) you can choose a quit date and start this medicine 1 week before the quit date, or, 2) you can start taking this medicine before you choose a quit date, and then pick a quit date between day 8 and 35 days of treatment, or, 3) if you are not sure that you are able or willing to quit smoking right away, start taking this medicine and slowly decrease the amount you smoke as directed by your health care professional with the goal of being cigarette-free by week 12 of treatment. Stick to your plan; ask about support groups or other ways to help you remain cigarette-free. If you are motivated to quit  smoking and did not succeed during a previous attempt with this medicine for reasons other than side effects, or if you returned to smoking after this treatment, speak with your health care professional about whether another course of this medicine may be right for you. A special MedGuide will be given to you by the pharmacist with each prescription and refill. Be sure to read this information carefully each time. Talk to your pediatrician regarding the use of this medicine in children. This medicine is not approved for use in children. Overdosage: If you think you have taken too much of this medicine contact a poison control center or emergency room at once. NOTE: This medicine is only for you. Do not share this medicine with others. What if I miss a dose? If you miss a dose, take it as soon as you can. If it is almost time for your next dose, take only that dose. Do not take double or extra doses. What may interact with this medicine? -alcohol or any product that contains alcohol -insulin -other stop smoking aids -theophylline -warfarin This list may not describe all possible interactions. Give your health care provider a list of all the medicines, herbs, non-prescription drugs, or dietary supplements you use. Also tell them if you smoke, drink alcohol, or use illegal drugs. Some items may interact with your medicine. What should I watch for while using this medicine? Visit your doctor or health care professional for regular check ups. Ask for ongoing advice and encouragement from your doctor or healthcare professional, friends, and family to help you quit. If   you smoke while on this medication, quit again Your mouth may get dry. Chewing sugarless gum or sucking hard candy, and drinking plenty of water may help. Contact your doctor if the problem does not go away or is severe. You may get drowsy or dizzy. Do not drive, use machinery, or do anything that needs mental alertness until you know how  this medicine affects you. Do not stand or sit up quickly, especially if you are an older patient. This reduces the risk of dizzy or fainting spells. Sleepwalking can happen during treatment with this medicine, and can sometimes lead to behavior that is harmful to you, other people, or property. Stop taking this medicine and tell your doctor if you start sleepwalking or have other unusual sleep-related activity. Decrease the amount of alcoholic beverages that you drink during treatment with this medicine until you know if this medicine affects your ability to tolerate alcohol. Some people have experienced increased drunkenness (intoxication), unusual or sometimes aggressive behavior, or no memory of things that have happened (amnesia) during treatment with this medicine. The use of this medicine may increase the chance of suicidal thoughts or actions. Pay special attention to how you are responding while on this medicine. Any worsening of mood, or thoughts of suicide or dying should be reported to your health care professional right away. What side effects may I notice from receiving this medicine? Side effects that you should report to your doctor or health care professional as soon as possible: -allergic reactions like skin rash, itching or hives, swelling of the face, lips, tongue, or throat -acting aggressive, being angry or violent, or acting on dangerous impulses -breathing problems -changes in vision -chest pain or chest tightness -confusion, trouble speaking or understanding -new or worsening depression, anxiety, or panic attacks -extreme increase in activity and talking (mania) -fast, irregular heartbeat -feeling faint or lightheaded, falls -fever -pain in legs when walking -problems with balance, talking, walking -redness, blistering, peeling or loosening of the skin, including inside the mouth -ringing in ears -seeing or hearing things that aren't there  (hallucinations) -seizures -sleepwalking -sudden numbness or weakness of the face, arm or leg -thoughts about suicide or dying, or attempts to commit suicide -trouble passing urine or change in the amount of urine -unusual bleeding or bruising -unusually weak or tired Side effects that usually do not require medical attention (report to your doctor or health care professional if they continue or are bothersome): -constipation -headache -nausea, vomiting -strange dreams -stomach gas -trouble sleeping This list may not describe all possible side effects. Call your doctor for medical advice about side effects. You may report side effects to FDA at 1-800-FDA-1088. Where should I keep my medicine? Keep out of the reach of children. Store at room temperature between 15 and 30 degrees C (59 and 86 degrees F). Throw away any unused medicine after the expiration date. NOTE: This sheet is a summary. It may not cover all possible information. If you have questions about this medicine, talk to your doctor, pharmacist, or health care provider.  2018 Elsevier/Gold Standard (2014-10-31 16:14:23)  

## 2017-11-01 NOTE — Assessment & Plan Note (Signed)
Continued mild exacerbation. Was unable to get Breo. Has restarted Symbicort, still using it only once daily. Refilling this, I described the importance of using this twice a day. Were also going to add Chantix for smoking cessation. Understand that we will never fully be able to get her symptoms under control as long she is smoking and not using her medication as directed. Risks, benefits explained.

## 2017-11-07 ENCOUNTER — Other Ambulatory Visit: Payer: Self-pay | Admitting: Osteopathic Medicine

## 2017-11-07 NOTE — Telephone Encounter (Signed)
Requesting RF on Lyrica  Last RX sent 10-07-17 for #60, 1 capsule BID, no RF   RX pended, please review and send if appropriate  Thanks!

## 2017-11-11 ENCOUNTER — Encounter: Payer: Self-pay | Admitting: Osteopathic Medicine

## 2017-11-14 MED ORDER — PREGABALIN 200 MG PO CAPS: ORAL_CAPSULE | ORAL | 0 refills | Status: DC

## 2017-11-14 NOTE — Telephone Encounter (Signed)
Please call the [facility] and make sure that the Lyrica Rx is canceled?  PMP reviewed, looks like she never picked it up.  See my chart message

## 2017-11-18 ENCOUNTER — Ambulatory Visit (INDEPENDENT_AMBULATORY_CARE_PROVIDER_SITE_OTHER): Payer: Medicaid Other | Admitting: Osteopathic Medicine

## 2017-11-18 ENCOUNTER — Encounter: Payer: Self-pay | Admitting: Osteopathic Medicine

## 2017-11-18 VITALS — BP 127/67 | HR 75 | Temp 98.0°F | Wt 268.5 lb

## 2017-11-18 DIAGNOSIS — J449 Chronic obstructive pulmonary disease, unspecified: Secondary | ICD-10-CM

## 2017-11-18 DIAGNOSIS — J0191 Acute recurrent sinusitis, unspecified: Secondary | ICD-10-CM

## 2017-11-18 DIAGNOSIS — H8110 Benign paroxysmal vertigo, unspecified ear: Secondary | ICD-10-CM | POA: Diagnosis not present

## 2017-11-18 DIAGNOSIS — F17200 Nicotine dependence, unspecified, uncomplicated: Secondary | ICD-10-CM | POA: Diagnosis not present

## 2017-11-18 MED ORDER — MECLIZINE HCL 25 MG PO TABS: 25.0000 mg | ORAL_TABLET | Freq: Three times a day (TID) | ORAL | 2 refills | Status: DC | PRN

## 2017-11-18 MED ORDER — MONTELUKAST SODIUM 10 MG PO TABS: 10.0000 mg | ORAL_TABLET | Freq: Every day | ORAL | 3 refills | Status: DC

## 2017-11-18 MED ORDER — FLUCONAZOLE 150 MG PO TABS: 150.0000 mg | ORAL_TABLET | Freq: Once | ORAL | 1 refills | Status: AC

## 2017-11-18 MED ORDER — DOXYCYCLINE HYCLATE 100 MG PO TABS: 100.0000 mg | ORAL_TABLET | Freq: Two times a day (BID) | ORAL | 0 refills | Status: DC

## 2017-11-18 NOTE — Patient Instructions (Addendum)
Plan:  We will try one more round of antibiotics which should hopefully clear up a sinus infection.  You should also be treating for allergies with Claritin/Allegra/Zyrtec pills plus Flonase/Nasonex nasal spray (this is the first thing ENT is going to tell you to do anyway).   See attached for additional information on inner ear problems/vertigo and maneuvers for self-treatment of this condition.  Can use the meclizine as needed.  Quitting or at least cutting back smoking is going to be the biggest help as far as preventing recurrent infections.  I encourage you to start the Chantix as soon as you feel ready to do so.   Taking the Symbicort twice daily is going to be crucial to prevent COPD problems.  I do hear wheezing in the lungs today.

## 2017-11-18 NOTE — Progress Notes (Signed)
HPI: Tabitha Estrada is a 47 y.o. female who  has a past medical history of Allergy, Anemia, Anxiety, Arthritis, Asthma, COPD (chronic obstructive pulmonary disease) ([place]), Depression, Diabetes mellitus without complication ([place]), Dyspnea, Fibromyalgia, GERD (gastroesophageal reflux disease), History of hiatal hernia, History of kidney stones, Hyperlipidemia, Neuromuscular disorder ([place]), Pneumonia, and Sleep apnea.  she presents to [facility] today, 11/18/17,  for chief complaint of:  Congestion, vertigo  Congestion seems to be aggravating vertigo. Since going to [place] last weekend, more so in the AM and PM when lying down or leaning forward or turning head too fast, she is having symptoms.   Recently saw a colleague for COPD exacerbation, visit with Dr. Everlene Balls 919, 10/14/2017, 11/01/2017.  Continued mild exacerbation as of last visit, Dr. Darene Lamer spent some time educating on appropriate twice daily use of Symbicort, added Chantix for smoking cessation.  On previous visits, she was rx azithromycin and Augmentin.  He has not yet started the Chantix, plans to do this next week.    Past medical history, surgical history, and family history reviewed.  Current medication list and allergy/intolerance information reviewed.   (See remainder of HPI, ROS, Phys Exam below)     ASSESSMENT/PLAN: The primary encounter diagnosis was Benign paroxysmal positional vertigo, unspecified laterality. Diagnoses of Acute recurrent sinusitis, unspecified location, COPD mixed type ([place]), and Tobacco dependence were also pertinent to this visit.    Meds ordered this encounter  Medications  . doxycycline (VIBRA-TABS) 100 MG tablet    Sig: Take 1 tablet (100 mg total) by mouth 2 (two) times daily.    Dispense:  10 tablet    Refill:  0  . meclizine (ANTIVERT) 25 MG tablet    Sig: Take 1 tablet (25 mg total) by mouth 3 (three) times daily as needed for dizziness.    Dispense:  30  tablet    Refill:  2  . fluconazole (DIFLUCAN) 150 MG tablet    Sig: Take 1 tablet (150 mg total) by mouth once for 1 dose. Repeat dose 72 hours if yeast infection persists    Dispense:  2 tablet    Refill:  1  . montelukast (SINGULAIR) 10 MG tablet    Sig: Take 1 tablet (10 mg total) by mouth daily.    Dispense:  90 tablet    Refill:  3    Orders Placed This Encounter  Procedures  . Ambulatory referral to ENT    Patient Instructions  Plan:  We will try one more round of antibiotics which should hopefully clear up a sinus infection.  You should also be treating for allergies with Claritin/Allegra/Zyrtec pills plus Flonase/Nasonex nasal spray (this is the first thing ENT is going to tell you to do anyway).   See attached for additional information on inner ear problems/vertigo and maneuvers for self-treatment of this condition.  Can use the meclizine as needed.  Quitting or at least cutting back smoking is going to be the biggest help as far as preventing recurrent infections.  I encourage you to start the Chantix as soon as you feel ready to do so.   Taking the Symbicort twice daily is going to be crucial to prevent COPD problems.  I do hear wheezing in the lungs today.        Follow-up plan: Return if symptoms worsen or fail to improve.                        ############################################ ############################################ ############################################ ############################################  Outpatient Encounter Medications as of 11/18/2017  Medication Sig Note  . albuterol (PROVENTIL HFA;VENTOLIN HFA) 108 (90 Base) MCG/ACT inhaler Inhale 1-2 puffs into the lungs every 4 (four) hours as needed for wheezing or shortness of breath.   Marland Kitchen albuterol (PROVENTIL) (2.5 MG/3ML) 0.083% nebulizer solution Inhale the contents of one vial in nebulizer every four to six hours as needed for cough or wheeze.   Marland Kitchen  AMBULATORY NON FORMULARY MEDICATION Compression stockings to knee or below knee, please measure patient for appropriate size. Disp: as preferred by patient or insurance. Sig: apply daily to prevent lower extremity swelling. Dx: dependent edema   . atorvastatin (LIPITOR) 80 MG tablet Take 1 tablet (80 mg total) by mouth daily.   Marland Kitchen BIOTIN PO Take by mouth daily.   . budesonide-formoterol (SYMBICORT) 160-4.5 MCG/ACT inhaler Inhale 2 puffs into the lungs 2 (two) times daily.   . cetirizine (ZYRTEC) 10 MG tablet Can take one tablet by mouth once or twice daily if needed.   . clobetasol ointment (TEMOVATE) 9.56 % Apply 1 application topically 2 (two) times daily. 11/09/2016: Taking PRN  . Cyanocobalamin (VITAMIN B-12) 5000 MCG TBDP Take 5,000 mcg by mouth daily.    . Diphenhyd-Hydrocort-Nystatin (FIRST-DUKES MOUTHWASH) SUSP Use as directed 5-10 mLs in the mouth or throat 4 (four) times daily.   Marland Kitchen docusate sodium (COLACE) 100 MG capsule Take 1 capsule (100 mg total) by mouth 2 (two) times daily. 11/09/2016: Taking PRN.  . fluticasone (FLONASE) 50 MCG/ACT nasal spray Use two sprays in each nostril once daily as directed.   . folic acid (FOLVITE) 1 MG tablet Take 1 mg by mouth daily.   . Glucose Blood (BLOOD GLUCOSE TEST STRIPS) STRP 1 each by Other route 3 (three) times a day. Use as instructed   . glucose blood (CONTOUR NEXT TEST) test strip Used to check blood sugars tree times daily.   . insulin aspart (NOVOLOG FLEXPEN) 100 UNIT/ML FlexPen Inject 40 Units into the skin daily with supper.   . Insulin Glargine (LANTUS SOLOSTAR) 100 UNIT/ML Solostar Pen Inject 140 Units into the skin daily. And pen needles 5/day   . Insulin Pen Needle 32G X 4 MM MISC by Does not apply route.   Marland Kitchen ipratropium (ATROVENT) 0.06 % nasal spray Place 2 sprays into both nostrils every 4 (four) hours as needed for rhinitis.   Marland Kitchen ipratropium-albuterol (DUONEB) 0.5-2.5 (3) MG/3ML SOLN Take 3 mLs by nebulization every 4 (four) hours as  needed (wheeze, SOB).   . meclizine (ANTIVERT) 25 MG tablet Take 1 tablet (25 mg total) by mouth 3 (three) times daily as needed for dizziness.   . montelukast (SINGULAIR) 10 MG tablet Take 1 tablet (10 mg total) by mouth daily.   Marland Kitchen nystatin (MYCOSTATIN/NYSTOP) powder Apply 1 g topically 2 (two) times daily as needed.   Marland Kitchen omeprazole (PRILOSEC) 40 MG capsule Take 1 capsule (40 mg total) by mouth every morning.   . ondansetron (ZOFRAN-ODT) 8 MG disintegrating tablet Dissolve 1 tablet (8 mg total) by mouth every 8 (eight) hours as needed for nausea.   . pregabalin (LYRICA) 200 MG capsule Take 1 capsule (200 mg total) by mouth 2 (two) times daily.   . ranitidine (ZANTAC) 300 MG tablet Take 1 tablet (300 mg total) by mouth at bedtime.   . tamsulosin (FLOMAX) 0.4 MG CAPS capsule Take 0.4 mg by mouth.   . valACYclovir (VALTREX) 500 MG tablet Take 1 tablet (500 mg total) by mouth at bedtime.   Marland Kitchen  varenicline (CHANTIX STARTING MONTH PAK) 0.5 MG X 11 & 1 MG X 42 tablet Take one 0.5mg  tablet by mouth qd for 3 days, then increase to one 0.5mg  tablet bid for 3 days, then increase to one 1mg  tablet bid.   . varenicline (CHANTIX) 1 MG tablet Take 1 tablet (1 mg total) by mouth 2 (two) times daily.    No facility-administered encounter medications on file as of 11/18/2017.    Allergies  Allergen Reactions  . Dulaglutide Other (See Comments)    Severe stomach pain  . Sulfur Nausea And Vomiting  . Metformin And Related Diarrhea      Review of Systems:  Constitutional: No recent illness  HEENT: No  headache, no vision change, +sinus congestion   Cardiac: No  chest pain, No  pressure, No palpitations  Respiratory:  No  shortness of breath. +Cough  Gastrointestinal: No  abdominal pain, no change on bowel habits  Musculoskeletal: No new myalgia/arthralgia  Skin: No  Rash  Hem/Onc: No  easy bruising/bleeding, No  abnormal lumps/bumps  Neurologic: No  weakness, +Dizziness  Psychiatric: No  concerns  with depression, No  concerns with anxiety  Exam:  BP 127/67 (BP Location: Left Arm, Patient Position: Sitting, Cuff Size: Large)   Pulse 75   Temp 98 F (36.7 C) (Oral)   Wt 268 lb 8 oz (121.8 kg)   BMI 44.68 kg/m   Constitutional: VS see above. General Appearance: alert, well-developed, well-nourished, NAD  Eyes: Normal lids and conjunctive, non-icteric sclera  Ears, Nose, Mouth, Throat: MMM, Normal external inspection ears/nares/mouth/lips/gums.  Neck: No masses, trachea midline.   Respiratory: Normal respiratory effort. + Bilateral mild wheeze, no rhonchi, no rales  Cardiovascular: S1/S2 normal, no murmur, no rub/gallop auscultated. RRR.   Musculoskeletal: Gait normal. Symmetric and independent movement of all extremities  Neurological: Normal balance/coordination. No tremor.  EOMI, PERRLA, no nystagmus.  Strength is 5 out of 5 in all 4 extremities.  Cerebellar reflexes are intact.  Cranial nerves are grossly intact.  Dix-Hallpike maneuver to the left reproduces vertigo  Skin: warm, dry, intact.   Psychiatric: Normal judgment/insight. Normal mood and affect. Oriented x3.   Visit summary with medication list and pertinent instructions was printed for patient to review, advised to alert [place] if any changes needed. All questions at time of visit were answered - patient instructed to contact office with any additional concerns. ER/RTC precautions were reviewed with the patient and understanding verbalized.   Follow-up plan: Return if symptoms worsen or fail to improve.    Please note: voice recognition software was used to produce this document, and typos may escape review. Please contact Dr. Sheppard Coil for any needed clarifications.

## 2017-11-20 ENCOUNTER — Encounter: Payer: Self-pay | Admitting: Osteopathic Medicine

## 2017-11-21 ENCOUNTER — Ambulatory Visit: Payer: Medicaid Other | Admitting: Osteopathic Medicine

## 2017-11-24 ENCOUNTER — Ambulatory Visit: Payer: Medicaid Other | Admitting: Osteopathic Medicine

## 2017-12-01 ENCOUNTER — Encounter: Payer: Self-pay | Admitting: Pulmonary Disease

## 2017-12-01 ENCOUNTER — Ambulatory Visit (INDEPENDENT_AMBULATORY_CARE_PROVIDER_SITE_OTHER): Payer: Medicaid Other | Admitting: Pulmonary Disease

## 2017-12-01 VITALS — BP 122/80 | HR 79 | Ht 65.0 in | Wt 268.5 lb

## 2017-12-01 DIAGNOSIS — F17210 Nicotine dependence, cigarettes, uncomplicated: Secondary | ICD-10-CM | POA: Diagnosis not present

## 2017-12-01 DIAGNOSIS — G4733 Obstructive sleep apnea (adult) (pediatric): Secondary | ICD-10-CM

## 2017-12-01 DIAGNOSIS — Z9989 Dependence on other enabling machines and devices: Secondary | ICD-10-CM

## 2017-12-01 DIAGNOSIS — J4530 Mild persistent asthma, uncomplicated: Secondary | ICD-10-CM | POA: Diagnosis not present

## 2017-12-01 DIAGNOSIS — Z23 Encounter for immunization: Secondary | ICD-10-CM | POA: Diagnosis not present

## 2017-12-01 DIAGNOSIS — J301 Allergic rhinitis due to pollen: Secondary | ICD-10-CM

## 2017-12-01 DIAGNOSIS — R0789 Other chest pain: Secondary | ICD-10-CM

## 2017-12-01 MED ORDER — AZELASTINE HCL 0.1 % NA SOLN: 2.0000 | Freq: Two times a day (BID) | NASAL | 5 refills | Status: DC

## 2017-12-01 NOTE — Patient Instructions (Signed)
Plan: Mild persistent asthma: Continue Symbicort 2 puffs twice a day no matter how you feel Its very important for you to quit smoking Flu shot  Chest pain: It bothers me that you are having this symptom in the setting of other medical problems and ongoing tobacco use and the fact that your father had heart disease. I am going to order a stress test to make sure there is not a problem with your heart If the chest pain escalates she should go to the emergency room It is a good idea to take an aspirin 81 mg tablet daily until you hear the results of the stress test  Allergic rhinitis: Use NeilMed saline rinses twice a day Take cetirizine 10 mg daily Take montelukast 10 mg daily Keep taking Flonase 2 sprays each nostril daily I will send in a new prescription for a medicine called Astelin take 2 sprays each nostril twice a day  We will see you back in 4 to 6 weeks with either myself or a nurse practitioner to make sure you are doing okay

## 2017-12-01 NOTE — Progress Notes (Signed)
Synopsis: Previously followed by Dr. Lamonte Sakai for OSA, COPD, asthma; still smoking   Subjective:   PATIENT ID: Tabitha Estrada GENDER: female DOB: 1970/04/22, MRN: [id number]   HPI  Chief Complaint  Patient presents with  . Consult    Patient was previously seen by RB at the [facility]. Patient requested to switch to [facility] location. History of COPD. Per patient she has been congested since August 2019. Non-productive cough. Increased SOB and wheezing. Increased chest tightness.     Recurrent sinus infections > have ben happening since August  > typically she has more sinus infections this time of year > she take zyrtec and singulair > she had allergy testing once and "they couldn't find anything" > she feels a scratchy throat > doesn't feel heart burn > has itchy eyes > she takes flonase > no sinus surgery > not using saline rinses > her new house doesn't have any mold in her new house, though she did in the old house  She sometimes feels out of breath > she sometimes notices wheezing, though not right now > sometimes she has occassional chest tightness  Chest pain > she feels an aching feeling in her chest > the aching is worse in the mornings and at night, typically at rest, now while active > sometimes happens while watching TV    Past Medical History:  Diagnosis Date  . Allergy   . Anemia   . Anxiety   . Arthritis   . Asthma   . COPD (chronic obstructive pulmonary disease) ([place])   . Depression   . Diabetes mellitus without complication ([place])   . Dyspnea    with exertion and bronchititis  . Fibromyalgia   . GERD (gastroesophageal reflux disease)   . History of hiatal hernia   . History of kidney stones   . Hyperlipidemia   . Neuromuscular disorder ([place])   . Pneumonia    hs, 01-19-15  . Sleep apnea    has Cpap have not been using     Family History  Problem Relation Age of Onset  . Diabetes Father   . Hyperlipidemia Father   . Hypertension Father   .  Diabetes Paternal Aunt   . Hypertension Paternal Aunt   . Heart attack Paternal Uncle   . Emphysema Maternal Grandmother   . Heart failure Maternal Grandmother   . Cancer Maternal Grandfather   . Colon cancer Neg Hx      Social History   Socioeconomic History  . Marital status: Married    Spouse name: Not on file  . Number of children: Not on file  . Years of education: Not on file  . Highest education level: Not on file  Occupational History  . Not on file  Social Needs  . Financial resource strain: Not on file  . Food insecurity:    Worry: Not on file    Inability: Not on file  . Transportation needs:    Medical: Not on file    Non-medical: Not on file  Tobacco Use  . Smoking status: Current Every Day Smoker    Packs/day: 1.00    Years: 33.00    Pack years: 33.00    Types: Cigarettes  . Smokeless tobacco: Never Used  . Tobacco comment: 05/17/2016 still smokes  Substance and Sexual Activity  . Alcohol use: No  . Drug use: No  . Sexual activity: Not on file  Lifestyle  . Physical activity:    Days per week: Not  on file    Minutes per session: Not on file  . Stress: Not on file  Relationships  . Social connections:    Talks on phone: Not on file    Gets together: Not on file    Attends religious service: Not on file    Active member of club or organization: Not on file    Attends meetings of clubs or organizations: Not on file    Relationship status: Not on file  . Intimate partner violence:    Fear of current or ex partner: Not on file    Emotionally abused: Not on file    Physically abused: Not on file    Forced sexual activity: Not on file  Other Topics Concern  . Not on file  Social History Narrative  . Not on file     Allergies  Allergen Reactions  . Dulaglutide Other (See Comments)    Severe stomach pain  . Sulfur Nausea And Vomiting    SULFA  . Metformin And Related Diarrhea     Outpatient Medications Prior to Visit  Medication Sig  Dispense Refill  . albuterol (PROVENTIL HFA;VENTOLIN HFA) 108 (90 Base) MCG/ACT inhaler Inhale 1-2 puffs into the lungs every 4 (four) hours as needed for wheezing or shortness of breath. 1 Inhaler 12  . albuterol (PROVENTIL) (2.5 MG/3ML) 0.083% nebulizer solution Inhale the contents of one vial in nebulizer every four to six hours as needed for cough or wheeze. 180 mL 1  . AMBULATORY NON FORMULARY MEDICATION Compression stockings to knee or below knee, please measure patient for appropriate size. Disp: as preferred by patient or insurance. Sig: apply daily to prevent lower extremity swelling. Dx: dependent edema 3 Units 99  . atorvastatin (LIPITOR) 80 MG tablet Take 1 tablet (80 mg total) by mouth daily. 90 tablet 3  . BIOTIN PO Take by mouth daily.    . budesonide-formoterol (SYMBICORT) 160-4.5 MCG/ACT inhaler Inhale 2 puffs into the lungs 2 (two) times daily. 1 Inhaler 11  . cetirizine (ZYRTEC) 10 MG tablet Can take one tablet by mouth once or twice daily if needed. 90 tablet 3  . clobetasol ointment (TEMOVATE) 7.67 % Apply 1 application topically 2 (two) times daily. 60 g 0  . Cyanocobalamin (VITAMIN B-12) 5000 MCG TBDP Take 5,000 mcg by mouth daily.     . Diphenhyd-Hydrocort-Nystatin (FIRST-DUKES MOUTHWASH) SUSP Use as directed 5-10 mLs in the mouth or throat 4 (four) times daily. 237 mL 1  . docusate sodium (COLACE) 100 MG capsule Take 1 capsule (100 mg total) by mouth 2 (two) times daily. 60 capsule 0  . fluticasone (FLONASE) 50 MCG/ACT nasal spray Use two sprays in each nostril once daily as directed. 16 g 5  . folic acid (FOLVITE) 1 MG tablet Take 1 mg by mouth daily.    . Glucose Blood (BLOOD GLUCOSE TEST STRIPS) STRP 1 each by Other route 3 (three) times a day. Use as instructed    . glucose blood (CONTOUR NEXT TEST) test strip Used to check blood sugars tree times daily. 100 each 12  . insulin aspart (NOVOLOG FLEXPEN) 100 UNIT/ML FlexPen Inject 40 Units into the skin daily with supper. 15  mL 6  . Insulin Glargine (LANTUS SOLOSTAR) 100 UNIT/ML Solostar Pen Inject 140 Units into the skin daily. And pen needles 5/day 20 pen PRN  . Insulin Pen Needle 32G X 4 MM MISC by Does not apply route.    Marland Kitchen ipratropium (ATROVENT) 0.06 % nasal spray Place  2 sprays into both nostrils every 4 (four) hours as needed for rhinitis. 10 mL 6  . ipratropium-albuterol (DUONEB) 0.5-2.5 (3) MG/3ML SOLN Take 3 mLs by nebulization every 4 (four) hours as needed (wheeze, SOB). 60 mL 3  . meclizine (ANTIVERT) 25 MG tablet Take 1 tablet (25 mg total) by mouth 3 (three) times daily as needed for dizziness. 30 tablet 2  . montelukast (SINGULAIR) 10 MG tablet Take 1 tablet (10 mg total) by mouth daily. 90 tablet 3  . nystatin (MYCOSTATIN/NYSTOP) powder Apply 1 g topically 2 (two) times daily as needed. 45 g 2  . omeprazole (PRILOSEC) 40 MG capsule Take 1 capsule (40 mg total) by mouth every morning. 90 capsule 3  . ondansetron (ZOFRAN-ODT) 8 MG disintegrating tablet Dissolve 1 tablet (8 mg total) by mouth every 8 (eight) hours as needed for nausea.    . pregabalin (LYRICA) 200 MG capsule Take 1 capsule (200 mg total) by mouth 2 (two) times daily. 60 capsule 0  . tamsulosin (FLOMAX) 0.4 MG CAPS capsule Take 0.4 mg by mouth.    . valACYclovir (VALTREX) 500 MG tablet Take 1 tablet (500 mg total) by mouth at bedtime. 90 tablet 3  . varenicline (CHANTIX STARTING MONTH PAK) 0.5 MG X 11 & 1 MG X 42 tablet Take one 0.5mg  tablet by mouth qd for 3 days, then increase to one 0.5mg  tablet bid for 3 days, then increase to one 1mg  tablet bid. 53 tablet 0  . varenicline (CHANTIX) 1 MG tablet Take 1 tablet (1 mg total) by mouth 2 (two) times daily. 60 tablet 3  . ranitidine (ZANTAC) 300 MG tablet Take 1 tablet (300 mg total) by mouth at bedtime. 90 tablet 3  . doxycycline (VIBRA-TABS) 100 MG tablet Take 1 tablet (100 mg total) by mouth 2 (two) times daily. 10 tablet 0   No facility-administered medications prior to visit.      Review of Systems  Constitutional: Negative for fever, malaise/fatigue and weight loss.  HENT: Positive for congestion. Negative for ear pain and sore throat.   Respiratory: Positive for cough. Negative for sputum production and shortness of breath.   Cardiovascular: Positive for chest pain. Negative for claudication and leg swelling.      Objective:  Physical Exam   Vitals:   12/01/17 1416  BP: 122/80  Pulse: 79  SpO2: 94%  Weight: 268 lb 8 oz (121.8 kg)  Height: 5\' 5"  (1.651 m)    Gen: well appearing, no acute distress HENT: NCAT, OP clear, neck supple without masses Eyes: PERRL, EOMi Lymph: no cervical lymphadenopathy PULM: CTA B CV: RRR, no mgr, no JVD GI: BS+, soft, nontender, no hsm Derm: no rash or skin breakdown MSK: normal bulk and tone Neuro: A&Ox4, CN II-XII intact, strength 5/5 in all 4 extremities Psyche: normal mood and affect   CBC    Component Value Date/Time   WBC 21.6 (H) 07/28/2017 1122   WBC 17.1 (H) 01/03/2017 1157   RBC 4.70 07/28/2017 1122   HGB 13.3 07/28/2017 1122   HGB 13.2 01/27/2017 1302   HCT 42.7 07/28/2017 1122   HCT 42.2 01/27/2017 1302   PLT 390 07/28/2017 1122   PLT 354 01/27/2017 1302   MCV 90.9 07/28/2017 1122   MCV 91 01/27/2017 1302   MCH 28.3 07/28/2017 1122   MCHC 31.1 (L) 07/28/2017 1122   RDW 18.0 (H) 07/28/2017 1122   RDW 18.5 (H) 01/27/2017 1302   LYMPHSABS 3.2 07/28/2017 1122   LYMPHSABS 2.5  05/17/2016 1350   MONOABS 0.2 07/28/2017 1122   EOSABS 0.4 07/28/2017 1122   EOSABS 0.3 05/17/2016 1350   BASOSABS 0.0 07/28/2017 1122   BASOSABS 0.2 05/17/2016 1350     Chest imaging: 03/2017 CXR images independently reviewed: no infiltrate, normal lung fields  PFT: 2018 PFT Ratio 81%, FEV1 2.05L (68% pred), FVC 2.55L (67% pred), 4.03L (77% pred), DLCO 13.03 mL (50% pred)  Labs:  Path:  Echo:  Heart Catheterization:  Records from her most recent visits with [place] reviewed where her COPD was cared for with  Symbicort and she was maintained on CPAP for obstructive sleep apnea.     Assessment & Plan:   No diagnosis found.  Discussion: Tabitha Estrada returns to clinic today with the specific complaint of recurrent sinus infections.  She says that she has day-to-day sinus congestion and postnasal drip with associated scratchy throat and itchy eyes.  I believe that ongoing tobacco abuse and poorly controlled allergic rhinitis for the most likely causes.  It is unclear to me if she has COPD as she actually does not have airflow obstruction.  I do not appreciate emphysema on her chest imaging.  Plan: Mild persistent asthma: Continue Symbicort 2 puffs twice a day no matter how you feel Its very important for you to quit smoking Flu shot  Chest pain: It bothers me that you are having this symptom in the setting of other medical problems and ongoing tobacco use and the fact that your father had heart disease. I am going to order a stress test to make sure there is not a problem with your heart If the chest pain escalates she should go to the emergency room It is a good idea to take an aspirin 81 mg tablet daily until you hear the results of the stress test  Allergic rhinitis: Use NeilMed saline rinses twice a day Take cetirizine 10 mg daily Take montelukast 10 mg daily Keep taking Flonase 2 sprays each nostril daily I will send in a new prescription for a medicine called Astelin take 2 sprays each nostril twice a day  We will see you back in 4 to 6 weeks with either myself or a nurse practitioner to make sure you are doing okay  > 50% of this 28 minute visit spent face to face    Current Outpatient Medications:  .  albuterol (PROVENTIL HFA;VENTOLIN HFA) 108 (90 Base) MCG/ACT inhaler, Inhale 1-2 puffs into the lungs every 4 (four) hours as needed for wheezing or shortness of breath., Disp: 1 Inhaler, Rfl: 12 .  albuterol (PROVENTIL) (2.5 MG/3ML) 0.083% nebulizer solution, Inhale the contents of  one vial in nebulizer every four to six hours as needed for cough or wheeze., Disp: 180 mL, Rfl: 1 .  AMBULATORY NON FORMULARY MEDICATION, Compression stockings to knee or below knee, please measure patient for appropriate size. Disp: as preferred by patient or insurance. Sig: apply daily to prevent lower extremity swelling. Dx: dependent edema, Disp: 3 Units, Rfl: 99 .  atorvastatin (LIPITOR) 80 MG tablet, Take 1 tablet (80 mg total) by mouth daily., Disp: 90 tablet, Rfl: 3 .  BIOTIN PO, Take by mouth daily., Disp: , Rfl:  .  budesonide-formoterol (SYMBICORT) 160-4.5 MCG/ACT inhaler, Inhale 2 puffs into the lungs 2 (two) times daily., Disp: 1 Inhaler, Rfl: 11 .  cetirizine (ZYRTEC) 10 MG tablet, Can take one tablet by mouth once or twice daily if needed., Disp: 90 tablet, Rfl: 3 .  clobetasol ointment (TEMOVATE) 0.05 %,  Apply 1 application topically 2 (two) times daily., Disp: 60 g, Rfl: 0 .  Cyanocobalamin (VITAMIN B-12) 5000 MCG TBDP, Take 5,000 mcg by mouth daily. , Disp: , Rfl:  .  Diphenhyd-Hydrocort-Nystatin (FIRST-DUKES MOUTHWASH) SUSP, Use as directed 5-10 mLs in the mouth or throat 4 (four) times daily., Disp: 237 mL, Rfl: 1 .  docusate sodium (COLACE) 100 MG capsule, Take 1 capsule (100 mg total) by mouth 2 (two) times daily., Disp: 60 capsule, Rfl: 0 .  fluticasone (FLONASE) 50 MCG/ACT nasal spray, Use two sprays in each nostril once daily as directed., Disp: 16 g, Rfl: 5 .  folic acid (FOLVITE) 1 MG tablet, Take 1 mg by mouth daily., Disp: , Rfl:  .  Glucose Blood (BLOOD GLUCOSE TEST STRIPS) STRP, 1 each by Other route 3 (three) times a day. Use as instructed, Disp: , Rfl:  .  glucose blood (CONTOUR NEXT TEST) test strip, Used to check blood sugars tree times daily., Disp: 100 each, Rfl: 12 .  insulin aspart (NOVOLOG FLEXPEN) 100 UNIT/ML FlexPen, Inject 40 Units into the skin daily with supper., Disp: 15 mL, Rfl: 6 .  Insulin Glargine (LANTUS SOLOSTAR) 100 UNIT/ML Solostar Pen, Inject 140  Units into the skin daily. And pen needles 5/day, Disp: 20 pen, Rfl: PRN .  Insulin Pen Needle 32G X 4 MM MISC, by Does not apply route., Disp: , Rfl:  .  ipratropium (ATROVENT) 0.06 % nasal spray, Place 2 sprays into both nostrils every 4 (four) hours as needed for rhinitis., Disp: 10 mL, Rfl: 6 .  ipratropium-albuterol (DUONEB) 0.5-2.5 (3) MG/3ML SOLN, Take 3 mLs by nebulization every 4 (four) hours as needed (wheeze, SOB)., Disp: 60 mL, Rfl: 3 .  meclizine (ANTIVERT) 25 MG tablet, Take 1 tablet (25 mg total) by mouth 3 (three) times daily as needed for dizziness., Disp: 30 tablet, Rfl: 2 .  montelukast (SINGULAIR) 10 MG tablet, Take 1 tablet (10 mg total) by mouth daily., Disp: 90 tablet, Rfl: 3 .  nystatin (MYCOSTATIN/NYSTOP) powder, Apply 1 g topically 2 (two) times daily as needed., Disp: 45 g, Rfl: 2 .  omeprazole (PRILOSEC) 40 MG capsule, Take 1 capsule (40 mg total) by mouth every morning., Disp: 90 capsule, Rfl: 3 .  ondansetron (ZOFRAN-ODT) 8 MG disintegrating tablet, Dissolve 1 tablet (8 mg total) by mouth every 8 (eight) hours as needed for nausea., Disp: , Rfl:  .  pregabalin (LYRICA) 200 MG capsule, Take 1 capsule (200 mg total) by mouth 2 (two) times daily., Disp: 60 capsule, Rfl: 0 .  tamsulosin (FLOMAX) 0.4 MG CAPS capsule, Take 0.4 mg by mouth., Disp: , Rfl:  .  valACYclovir (VALTREX) 500 MG tablet, Take 1 tablet (500 mg total) by mouth at bedtime., Disp: 90 tablet, Rfl: 3 .  varenicline (CHANTIX STARTING MONTH PAK) 0.5 MG X 11 & 1 MG X 42 tablet, Take one 0.5mg  tablet by mouth qd for 3 days, then increase to one 0.5mg  tablet bid for 3 days, then increase to one 1mg  tablet bid., Disp: 53 tablet, Rfl: 0 .  varenicline (CHANTIX) 1 MG tablet, Take 1 tablet (1 mg total) by mouth 2 (two) times daily., Disp: 60 tablet, Rfl: 3 .  ranitidine (ZANTAC) 300 MG tablet, Take 1 tablet (300 mg total) by mouth at bedtime., Disp: 90 tablet, Rfl: 3

## 2017-12-15 ENCOUNTER — Telehealth (HOSPITAL_COMMUNITY): Payer: Self-pay | Admitting: *Deleted

## 2017-12-15 NOTE — Telephone Encounter (Signed)
Left message on voicemail per DPR in reference to upcoming appointment scheduled on 12/20/17 with detailed instructions given per Myocardial Perfusion Study Information Sheet for the test. LM to arrive 15 minutes early, and that it is imperative to arrive on time for appointment to keep from having the test rescheduled. If you need to cancel or reschedule your appointment, please call the office within 24 hours of your appointment. Failure to do so may result in a cancellation of your appointment, and a $50 no show fee. Phone number given for call back for any questions. Kirstie Peri

## 2017-12-16 ENCOUNTER — Other Ambulatory Visit: Payer: Self-pay | Admitting: Osteopathic Medicine

## 2017-12-16 NOTE — Telephone Encounter (Signed)
[facility] requesting med refill for lyrica.

## 2017-12-18 ENCOUNTER — Encounter: Payer: Self-pay | Admitting: Osteopathic Medicine

## 2017-12-19 NOTE — Telephone Encounter (Signed)
Left a detailed vm msg regarding med refill sent to local pharmacy. Call back info provided.  

## 2017-12-20 ENCOUNTER — Ambulatory Visit (HOSPITAL_COMMUNITY): Payer: Medicaid Other | Attending: Cardiology

## 2017-12-20 DIAGNOSIS — R0789 Other chest pain: Secondary | ICD-10-CM | POA: Diagnosis not present

## 2017-12-20 MED ORDER — TECHNETIUM TC 99M TETROFOSMIN IV KIT
32.0000 | PACK | Freq: Once | INTRAVENOUS | Status: AC | PRN
  Administered 2017-12-20: 32 via INTRAVENOUS
  Filled 2017-12-20: qty 32

## 2017-12-20 MED ORDER — REGADENOSON 0.4 MG/5ML IV SOLN
0.4000 mg | Freq: Once | INTRAVENOUS | Status: AC
  Administered 2017-12-20: 0.4 mg via INTRAVENOUS

## 2017-12-21 ENCOUNTER — Ambulatory Visit (HOSPITAL_COMMUNITY): Payer: Medicaid Other | Attending: Cardiology

## 2017-12-21 ENCOUNTER — Ambulatory Visit: Payer: Medicaid Other | Admitting: Osteopathic Medicine

## 2017-12-21 LAB — MYOCARDIAL PERFUSION IMAGING
CHL CUP NUCLEAR SRS: 4
CHL CUP NUCLEAR SSS: 4
CSEPEDS: 0 s
CSEPPHR: 127 {beats}/min
Estimated workload: 4.9 METS
Exercise duration (min): 5 min
LV dias vol: 75 mL (ref 46–106)
LVSYSVOL: 29 mL
MPHR: 175 {beats}/min
Percent HR: 72 %
Rest HR: 76 {beats}/min
SDS: 0
TID: 0.78

## 2017-12-21 MED ORDER — TECHNETIUM TC 99M TETROFOSMIN IV KIT
32.5000 | PACK | Freq: Once | INTRAVENOUS | Status: AC | PRN
  Administered 2017-12-21: 32.5 via INTRAVENOUS
  Filled 2017-12-21: qty 33

## 2017-12-22 ENCOUNTER — Telehealth: Payer: Self-pay | Admitting: Pulmonary Disease

## 2017-12-22 NOTE — Telephone Encounter (Signed)
McQuaid, Douglas B, MD sent to Tillman, Bettie J, RN        BJ,  Please let the patient know this was OK  Thanks,  B    Spoke with pt and notified of results per Dr. McQuaid. Pt verbalized understanding and denied any questions.  

## 2017-12-22 NOTE — Progress Notes (Signed)
Spoke with pt and notified of results per Dr.McQuaid. Pt verbalized understanding and denied any questions. 

## 2017-12-26 ENCOUNTER — Ambulatory Visit: Payer: Medicaid Other | Admitting: Podiatry

## 2018-01-09 ENCOUNTER — Encounter: Payer: Self-pay | Admitting: Obstetrics & Gynecology

## 2018-01-09 ENCOUNTER — Ambulatory Visit: Payer: Medicaid Other | Admitting: Obstetrics & Gynecology

## 2018-01-09 ENCOUNTER — Other Ambulatory Visit (HOSPITAL_COMMUNITY)
Admission: RE | Admit: 2018-01-09 | Discharge: 2018-01-09 | Disposition: A | Discharge status: Home / Self Care | Payer: Medicaid Other | Source: Ambulatory Visit | Attending: Obstetrics & Gynecology | Admitting: Obstetrics & Gynecology

## 2018-01-09 VITALS — BP 134/85 | HR 91 | Ht 65.0 in | Wt 260.0 lb

## 2018-01-09 DIAGNOSIS — Z0000 Encounter for general adult medical examination without abnormal findings: Secondary | ICD-10-CM | POA: Diagnosis not present

## 2018-01-09 DIAGNOSIS — Z124 Encounter for screening for malignant neoplasm of cervix: Secondary | ICD-10-CM | POA: Diagnosis not present

## 2018-01-09 DIAGNOSIS — Z1239 Encounter for other screening for malignant neoplasm of breast: Secondary | ICD-10-CM

## 2018-01-09 DIAGNOSIS — N926 Irregular menstruation, unspecified: Secondary | ICD-10-CM

## 2018-01-09 NOTE — Progress Notes (Signed)
GAD 7: Score 4 PHQ 9: Score 10

## 2018-01-09 NOTE — Progress Notes (Signed)
Last pap more than 3 years ago- normal Last mammogram- 02/11/17- normal  Last period Sept 2018- pt c/o occasional hot flashes PT had kidney stone in July 2019 and had spotting for 3 weeks around that time  Menarche--12/13 Monthly menses until 36 and became heavier and more painful  2017--started skipping menses.   September 2018 last menses July 2019--during kidney stone had bleeding but suspect it was from her urine.    Occasional hot flashes.  Does not interfering with life.  Pt says she is moody. She has had depression ((pp depression after 2nd child).  Not currently being treated.   Subjective:     Tabitha Estrada is a 47 y.o. female here for a routine exam.  Current complaints: questions whether she is in menopause.   Menarche--12/13 Monthly menses until 36 and became heavier and more painful  2017--started skipping menses.   September 2018 last menses July 2019--during kidney stone had bleeding but suspect it was from her urine.    Occasional hot flashes.  Does not interfering with life.  Pt says she is moody. She has had depression ((pp depression after 2nd child).  Not currently being treated.    Gynecologic History Patient's last menstrual period was 11/17/2016 (approximate). Contraception: tubal ligation Last Pap: several years ago and reports nml  .  Last mammogram: 2018. Results were: normal  Obstetric History OB History  Gravida Para Term Preterm AB Living  7 3     4 3   SAB TAB Ectopic Multiple Live Births  4            # Outcome Date GA Lbr Len/2nd Weight Sex Delivery Anes PTL Lv  7 SAB           6 SAB           5 SAB           4 SAB           3 Para           2 Para           1 Para              The following portions of the patient's history were reviewed and updated as appropriate: allergies, current medications, past family history, past medical history, past social history, past surgical history and problem list.  Review of Systems Pertinent  items noted in HPI and remainder of comprehensive ROS otherwise negative.    Objective:      Vitals:   01/09/18 1026  BP: 134/85  Pulse: 91  Weight: 260 lb (117.9 kg)  Height: 5\' 5"  (1.651 m)   Vitals:  WNL General appearance: alert, cooperative and no distress  HEENT: Normocephalic, without obvious abnormality, atraumatic Eyes: negative Throat: lips, mucosa, and tongue normal; teeth and gums normal  Respiratory: Clear to auscultation bilaterally  CV: Regular rate and rhythm  Breasts:  Normal appearance, no masses or tenderness, no nipple retraction or dimpling  GI: Soft, non-tender; bowel sounds normal; no masses,  no organomegaly  GU: External Genitalia:  Tanner V, no lesion Urethra:  No prolapse   Vagina: Pink, normal rugae, no blood or discharge  Cervix: No CMT, no lesion  Uterus:  Normal size and contour, non tender; limited by habitus  Adnexa: Normal, no masses, non tender; Very limited by habitus  Musculoskeletal: No edema, redness or tenderness in the calves or thighs  Skin: No lesions or rash  Lymphatic: Axillary adenopathy: none  Psychiatric: Normal mood and behavior        Assessment:    Healthy female exam.   Moderate depression Amenorrhea    Plan:    1.  Pap with co testing 2.  Mammogram 3. FSH 4.  BD affirm 5.  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  Continue daily valtrex.  Pt understands should increase to Valtrex bid if outbreak occurs. 6.  GAD 7: Score 4 PHQ 9: Score 10 Refer back to PCP for medicaiton.  Pt has symptoms of agoraphobia and would benefit from CBT.  Will allow Dr. Sheppard Coil to address.

## 2018-01-10 LAB — FOLLICLE STIMULATING HORMONE: FSH: 62.5 m[IU]/mL

## 2018-01-11 LAB — CYTOLOGY - PAP
BACTERIAL VAGINITIS: NEGATIVE
CANDIDA VAGINITIS: NEGATIVE
DIAGNOSIS: NEGATIVE
HPV: NOT DETECTED

## 2018-01-12 ENCOUNTER — Encounter: Payer: Self-pay | Admitting: Osteopathic Medicine

## 2018-01-12 ENCOUNTER — Telehealth: Payer: Self-pay

## 2018-01-12 DIAGNOSIS — Z794 Long term (current) use of insulin: Principal | ICD-10-CM

## 2018-01-12 DIAGNOSIS — E1165 Type 2 diabetes mellitus with hyperglycemia: Secondary | ICD-10-CM

## 2018-01-12 NOTE — Telephone Encounter (Addendum)
Spoke with pt and she is aware of negative pap smear results and FSH came back consistent with menopause. Pt expressed understanding.  ----- Message from Guss Bunde, MD sent at 01/12/2018 10:54 AM EST ----- [facility] consistent with menopause; no vaginitis

## 2018-01-13 MED ORDER — ATORVASTATIN CALCIUM 80 MG PO TABS: 80.0000 mg | ORAL_TABLET | Freq: Every day | ORAL | 3 refills | Status: DC

## 2018-01-23 ENCOUNTER — Encounter: Payer: Self-pay | Admitting: Osteopathic Medicine

## 2018-01-23 DIAGNOSIS — K589 Irritable bowel syndrome without diarrhea: Secondary | ICD-10-CM

## 2018-01-23 DIAGNOSIS — J984 Other disorders of lung: Secondary | ICD-10-CM

## 2018-01-23 DIAGNOSIS — I10 Essential (primary) hypertension: Secondary | ICD-10-CM

## 2018-01-23 DIAGNOSIS — G4733 Obstructive sleep apnea (adult) (pediatric): Secondary | ICD-10-CM

## 2018-01-23 DIAGNOSIS — Z9989 Dependence on other enabling machines and devices: Secondary | ICD-10-CM

## 2018-01-23 DIAGNOSIS — E538 Deficiency of other specified B group vitamins: Secondary | ICD-10-CM

## 2018-01-23 DIAGNOSIS — E559 Vitamin D deficiency, unspecified: Secondary | ICD-10-CM

## 2018-01-23 DIAGNOSIS — D508 Other iron deficiency anemias: Secondary | ICD-10-CM

## 2018-01-23 NOTE — Telephone Encounter (Signed)
Orders in to recheck cholesterol as well as few others to monitor chronic conditions - please have her schedule an annual physical, her last one was about a year ago

## 2018-02-14 ENCOUNTER — Encounter: Payer: Self-pay | Admitting: Pulmonary Disease

## 2018-02-14 ENCOUNTER — Ambulatory Visit (INDEPENDENT_AMBULATORY_CARE_PROVIDER_SITE_OTHER): Payer: Medicaid Other | Admitting: Pulmonary Disease

## 2018-02-14 ENCOUNTER — Other Ambulatory Visit: Payer: Self-pay | Admitting: Pulmonary Disease

## 2018-02-14 VITALS — BP 156/89 | HR 83 | Ht 65.0 in | Wt 268.0 lb

## 2018-02-14 DIAGNOSIS — J301 Allergic rhinitis due to pollen: Secondary | ICD-10-CM

## 2018-02-14 DIAGNOSIS — Z9989 Dependence on other enabling machines and devices: Secondary | ICD-10-CM

## 2018-02-14 DIAGNOSIS — J4530 Mild persistent asthma, uncomplicated: Secondary | ICD-10-CM

## 2018-02-14 DIAGNOSIS — G4733 Obstructive sleep apnea (adult) (pediatric): Secondary | ICD-10-CM | POA: Diagnosis not present

## 2018-02-14 DIAGNOSIS — J45901 Unspecified asthma with (acute) exacerbation: Secondary | ICD-10-CM

## 2018-02-14 DIAGNOSIS — F17210 Nicotine dependence, cigarettes, uncomplicated: Secondary | ICD-10-CM | POA: Diagnosis not present

## 2018-02-14 MED ORDER — NICOTINE 10 MG IN INHA: 1.0000 | RESPIRATORY_TRACT | 0 refills | Status: DC | PRN

## 2018-02-14 MED ORDER — BENZONATATE 200 MG PO CAPS: 200.0000 mg | ORAL_CAPSULE | Freq: Three times a day (TID) | ORAL | 1 refills | Status: DC | PRN

## 2018-02-14 MED ORDER — AZITHROMYCIN 250 MG PO TABS: ORAL_TABLET | ORAL | 0 refills | Status: DC

## 2018-02-14 MED ORDER — PREDNISONE 20 MG PO TABS: 20.0000 mg | ORAL_TABLET | Freq: Every day | ORAL | 0 refills | Status: DC

## 2018-02-14 NOTE — Progress Notes (Signed)
Synopsis: Previously followed by Dr. Lamonte Sakai for OSA, COPD, asthma; still smoking   Subjective:   PATIENT ID: Tabitha Estrada GENDER: female DOB: 04-09-1970, MRN: [id number]   HPI  Chief Complaint  Patient presents with  . Follow-up    Pt has prod cough-clear/brown, sore throat, wheezing and SOB in last 4 weeks.    Tabitha Estrada has been struggling with sinus congestion for a month > has been taking phenyleprine > robitussin for cough > mucinex > tyelenox  > she is not getting better > she feels like she has mucus in her chest > no sick contacts > some achiness > no fevers, chills > more dyspnea than usual > She is still taking Symbicort 2 puffs bid > headaches at night  OSA > not able to use it much lately due to sinus problems   Still smoking 1ppd.  Past Medical History:  Diagnosis Date  . Allergy   . Anemia   . Anxiety   . Arthritis   . Asthma   . COPD (chronic obstructive pulmonary disease) ([place])   . Depression   . Diabetes mellitus without complication ([place])   . Dyspnea    with exertion and bronchititis  . Fibromyalgia   . GERD (gastroesophageal reflux disease)   . History of hiatal hernia   . History of kidney stones   . Hyperlipidemia   . Neuromuscular disorder ([place])   . Pneumonia    hs, 01-19-15  . Sleep apnea    has Cpap have not been using     Review of Systems  Constitutional: Negative for fever, malaise/fatigue and weight loss.  HENT: Positive for congestion. Negative for ear pain and sore throat.   Respiratory: Positive for cough. Negative for sputum production and shortness of breath.   Cardiovascular: Positive for chest pain. Negative for claudication and leg swelling.      Objective:  Physical Exam   Vitals:   02/14/18 1457  BP: (!) 156/89  Pulse: 83  SpO2: 93%  Weight: 268 lb (121.6 kg)  Height: 5\' 5"  (1.651 m)    Gen: chronically ill  appearing HENT: OP clear, TM's clear, neck supple PULM: Few wheezes upper lobes  bilaterally B, normal percussion CV: RRR, no mgr, trace edema GI: BS+, soft, nontender Derm: no cyanosis or rash Psyche: normal mood and affect   CBC    Component Value Date/Time   WBC 21.6 (H) 07/28/2017 1122   WBC 17.1 (H) 01/03/2017 1157   RBC 4.70 07/28/2017 1122   HGB 13.3 07/28/2017 1122   HGB 13.2 01/27/2017 1302   HCT 42.7 07/28/2017 1122   HCT 42.2 01/27/2017 1302   PLT 390 07/28/2017 1122   PLT 354 01/27/2017 1302   MCV 90.9 07/28/2017 1122   MCV 91 01/27/2017 1302   MCH 28.3 07/28/2017 1122   MCHC 31.1 (L) 07/28/2017 1122   RDW 18.0 (H) 07/28/2017 1122   RDW 18.5 (H) 01/27/2017 1302   LYMPHSABS 3.2 07/28/2017 1122   LYMPHSABS 2.5 05/17/2016 1350   MONOABS 0.2 07/28/2017 1122   EOSABS 0.4 07/28/2017 1122   EOSABS 0.3 05/17/2016 1350   BASOSABS 0.0 07/28/2017 1122   BASOSABS 0.2 05/17/2016 1350     Chest imaging: 03/2017 CXR images independently reviewed: no infiltrate, normal lung fields  Other imaging 04/2017 CT Sinuses: no swelling or evidence of infection  PFT: 2018 PFT Ratio 81%, FEV1 2.05L (68% pred), FVC 2.55L (67% pred), 4.03L (77% pred), DLCO 13.03 mL (50% pred)  Labs:  Path:  Echo:  Heart Catheterization:  Records from her most recent visits with [place] reviewed where her COPD was cared for with Symbicort and she was maintained on CPAP for obstructive sleep apnea.     Assessment & Plan:   OSA on CPAP  Mild persistent asthma without complication  Cigarette smoker  Allergic rhinitis due to pollen, unspecified seasonality  Moderate asthma with exacerbation, unspecified whether persistent  Discussion: Tabitha Estrada is still struggling.  She now has an exacerbation of her asthma and sinus congestion.  I explained to her today that there is only so much medical treatment can do and I do not know how well she could be without cigarettes.  I believe that is the predominant cause of her ongoing symptoms.  Plan: Asthma exacerbation Take a Z-Pak Take  prednisone 20 mg daily Use Tessalon to help with the cough, 1 tablet every 8 hours as needed for cough Continue taking Symbicort Keep using albuterol as needed for chest tightness wheezing or shortness of breath  Tobacco abuse: You desperately need to quit smoking Use the Nicotrol inhaler we prescribed today to help quit smoking  Sinus congestion: Use saline rinses as needed The prednisone and Z-Pak should help  Obstructive sleep apnea > keep using CPAP  We will see you back in 3 to 4 months or sooner if needed    Current Outpatient Medications:  .  albuterol (PROVENTIL HFA;VENTOLIN HFA) 108 (90 Base) MCG/ACT inhaler, Inhale 1-2 puffs into the lungs every 4 (four) hours as needed for wheezing or shortness of breath., Disp: 1 Inhaler, Rfl: 12 .  albuterol (PROVENTIL) (2.5 MG/3ML) 0.083% nebulizer solution, Inhale the contents of one vial in nebulizer every four to six hours as needed for cough or wheeze., Disp: 180 mL, Rfl: 1 .  atorvastatin (LIPITOR) 80 MG tablet, Take 1 tablet (80 mg total) by mouth daily., Disp: 90 tablet, Rfl: 3 .  azelastine (ASTELIN) 0.1 % nasal spray, Place 2 sprays into both nostrils 2 (two) times daily. Use in each nostril as directed, Disp: 30 mL, Rfl: 5 .  BIOTIN PO, Take by mouth daily., Disp: , Rfl:  .  budesonide-formoterol (SYMBICORT) 160-4.5 MCG/ACT inhaler, Inhale 2 puffs into the lungs 2 (two) times daily., Disp: 1 Inhaler, Rfl: 11 .  cetirizine (ZYRTEC) 10 MG tablet, Can take one tablet by mouth once or twice daily if needed., Disp: 90 tablet, Rfl: 3 .  clobetasol ointment (TEMOVATE) 1.54 %, Apply 1 application topically 2 (two) times daily., Disp: 60 g, Rfl: 0 .  docusate sodium (COLACE) 100 MG capsule, Take 1 capsule (100 mg total) by mouth 2 (two) times daily., Disp: 60 capsule, Rfl: 0 .  fluticasone (FLONASE) 50 MCG/ACT nasal spray, Use two sprays in each nostril once daily as directed., Disp: 16 g, Rfl: 5 .  folic acid (FOLVITE) 1 MG tablet, Take  1 mg by mouth daily., Disp: , Rfl:  .  Glucose Blood (BLOOD GLUCOSE TEST STRIPS) STRP, 1 each by Other route 3 (three) times a day. Use as instructed, Disp: , Rfl:  .  glucose blood (CONTOUR NEXT TEST) test strip, Used to check blood sugars tree times daily., Disp: 100 each, Rfl: 12 .  insulin aspart (NOVOLOG FLEXPEN) 100 UNIT/ML FlexPen, Inject 40 Units into the skin daily with supper., Disp: 15 mL, Rfl: 6 .  Insulin Glargine (LANTUS SOLOSTAR) 100 UNIT/ML Solostar Pen, Inject 140 Units into the skin daily. And pen needles 5/day, Disp: 20 pen, Rfl: PRN .  Insulin  Pen Needle 32G X 4 MM MISC, by Does not apply route., Disp: , Rfl:  .  ipratropium (ATROVENT) 0.06 % nasal spray, Place 2 sprays into both nostrils every 4 (four) hours as needed for rhinitis., Disp: 10 mL, Rfl: 6 .  ipratropium-albuterol (DUONEB) 0.5-2.5 (3) MG/3ML SOLN, Take 3 mLs by nebulization every 4 (four) hours as needed (wheeze, SOB)., Disp: 60 mL, Rfl: 3 .  meclizine (ANTIVERT) 25 MG tablet, Take 1 tablet (25 mg total) by mouth 3 (three) times daily as needed for dizziness., Disp: 30 tablet, Rfl: 2 .  montelukast (SINGULAIR) 10 MG tablet, Take 1 tablet (10 mg total) by mouth daily., Disp: 90 tablet, Rfl: 3 .  nystatin (MYCOSTATIN/NYSTOP) powder, Apply 1 g topically 2 (two) times daily as needed., Disp: 45 g, Rfl: 2 .  omeprazole (PRILOSEC) 40 MG capsule, Take 1 capsule (40 mg total) by mouth every morning., Disp: 90 capsule, Rfl: 3 .  ondansetron (ZOFRAN-ODT) 8 MG disintegrating tablet, Dissolve 1 tablet (8 mg total) by mouth every 8 (eight) hours as needed for nausea., Disp: , Rfl:  .  pregabalin (LYRICA) 200 MG capsule, TAKE 1 CAPSULE BY MOUTH 2 TIMES DAILY., Disp: 60 capsule, Rfl: 5 .  tamsulosin (FLOMAX) 0.4 MG CAPS capsule, Take 0.4 mg by mouth., Disp: , Rfl:  .  valACYclovir (VALTREX) 500 MG tablet, Take 1 tablet (500 mg total) by mouth at bedtime., Disp: 90 tablet, Rfl: 3 .  varenicline (CHANTIX STARTING MONTH PAK) 0.5 MG X  11 & 1 MG X 42 tablet, Take one 0.5mg  tablet by mouth qd for 3 days, then increase to one 0.5mg  tablet bid for 3 days, then increase to one 1mg  tablet bid., Disp: 53 tablet, Rfl: 0 .  ranitidine (ZANTAC) 300 MG tablet, Take 1 tablet (300 mg total) by mouth at bedtime., Disp: 90 tablet, Rfl: 3

## 2018-02-14 NOTE — Patient Instructions (Signed)
Asthma exacerbation Take a Z-Pak Take prednisone 20 mg daily Use Tessalon to help with the cough, 1 tablet every 8 hours as needed for cough Continue taking Symbicort Keep using albuterol as needed for chest tightness wheezing or shortness of breath  Tobacco abuse: You desperately need to quit smoking Use the Nicotrol inhaler we prescribed today to help quit smoking  Sinus congestion: Use saline rinses as needed The prednisone and Z-Pak should help  Obstructive sleep apnea > keep using CPAP  We will see you back in 3 to 4 months or sooner if needed

## 2018-02-15 ENCOUNTER — Telehealth: Payer: Self-pay | Admitting: Pulmonary Disease

## 2018-02-15 ENCOUNTER — Telehealth: Payer: Self-pay | Admitting: *Deleted

## 2018-02-15 ENCOUNTER — Ambulatory Visit: Payer: Medicaid Other

## 2018-02-15 NOTE — Telephone Encounter (Signed)
PA required for both per [place] are faxing over PA information. Will hold in Triage for PA information.

## 2018-02-15 NOTE — Telephone Encounter (Signed)
Left patient a message to F/U with Dr. Redgie Grayer office about yeast infection under her breast. We don't have any appointments this week or next at this time.

## 2018-02-16 NOTE — Telephone Encounter (Signed)
It does not appear that PA request has been received. ATC [facility] x[facility] and received busy dial. Will call back

## 2018-02-20 NOTE — Telephone Encounter (Signed)
Called and spoke with [facility].  She stated that a fax had been sent 02/14/18. Explained that fax not received.  [name] is sending a new fax over today, fax number clarified.

## 2018-02-21 NOTE — Telephone Encounter (Signed)
Received PA request from pharmacy. Patient needs a PA on her benzonatate 200mg  and Nicotrol inhaler.   Attempted to start PA via [facility], they are currently closed. Will place PA into the PA folder in triage.

## 2018-02-23 NOTE — Telephone Encounter (Signed)
Tried to call [facility] at phone [phone] this morning They are currently closed for the Holidays.  Will route to [place] to f/u next week.

## 2018-02-24 NOTE — Telephone Encounter (Signed)
Tried to call [facility] at phone [phone] this morning They are currently closed for the Holidays. Will re-open on 02/27/18

## 2018-02-27 NOTE — Telephone Encounter (Signed)
PA will be followed up through triage per triage protocol.  BQ please advise if you recommend any alternatives for tessalon perles, as this is not covered by Medicaid with no option for PA? Thanks!   (Medicaid typically does not cover any prescription cough suppressants)

## 2018-02-27 NOTE — Telephone Encounter (Signed)
Called and spoke with [place], [place] [facility], [phone]. PA started 02/27/18, for Nicotrol Inhaler, 10mg  inhale 1 cartridge into lungs as needed for smoking cessation.   PA # [id number].  PA for Benzonatate 200mg , cough medications not covered under Medicaid.  Unable to start PA.  Will route to Howell C to follow up

## 2018-02-27 NOTE — Telephone Encounter (Signed)
Pt is calling back [phone] pt said that the medicaid is not approving Nicotine Inh   [facility]

## 2018-03-01 NOTE — Telephone Encounter (Signed)
delsym

## 2018-03-02 NOTE — Telephone Encounter (Signed)
Attempted to contact pt. I did not receive an answer. I have left a message for pt to return our call.  

## 2018-03-02 NOTE — Telephone Encounter (Signed)
Pt is calling back [phone]

## 2018-03-02 NOTE — Telephone Encounter (Signed)
Spoke with pt. States that she bought Tessalon out of pocket. In regards to the Nicotrol inhaler, she is going to try to take Chantix again. She will let us know if she has any problems. Nothing further was needed at this time.

## 2018-03-04 ENCOUNTER — Telehealth: Payer: Self-pay | Admitting: Hematology & Oncology

## 2018-03-04 NOTE — Telephone Encounter (Signed)
MEDICAL RECORDS REQUEST   REQUEST FAXED TO: [facility]  PHONE #  FAX # [phone]  REQUEST INDEXED

## 2018-03-07 ENCOUNTER — Other Ambulatory Visit: Payer: Self-pay | Admitting: *Deleted

## 2018-03-07 DIAGNOSIS — Z1239 Encounter for other screening for malignant neoplasm of breast: Secondary | ICD-10-CM

## 2018-03-08 ENCOUNTER — Ambulatory Visit (INDEPENDENT_AMBULATORY_CARE_PROVIDER_SITE_OTHER): Payer: Medicaid Other

## 2018-03-08 DIAGNOSIS — Z1239 Encounter for other screening for malignant neoplasm of breast: Secondary | ICD-10-CM

## 2018-03-14 ENCOUNTER — Ambulatory Visit (INDEPENDENT_AMBULATORY_CARE_PROVIDER_SITE_OTHER): Payer: Medicaid Other | Admitting: Osteopathic Medicine

## 2018-03-14 ENCOUNTER — Encounter: Payer: Self-pay | Admitting: Osteopathic Medicine

## 2018-03-14 ENCOUNTER — Ambulatory Visit (INDEPENDENT_AMBULATORY_CARE_PROVIDER_SITE_OTHER): Payer: Medicaid Other

## 2018-03-14 VITALS — BP 144/72 | HR 76 | Temp 98.1°F | Wt 261.6 lb

## 2018-03-14 DIAGNOSIS — J984 Other disorders of lung: Secondary | ICD-10-CM | POA: Diagnosis not present

## 2018-03-14 DIAGNOSIS — G4733 Obstructive sleep apnea (adult) (pediatric): Secondary | ICD-10-CM

## 2018-03-14 DIAGNOSIS — R05 Cough: Secondary | ICD-10-CM | POA: Diagnosis not present

## 2018-03-14 DIAGNOSIS — J449 Chronic obstructive pulmonary disease, unspecified: Secondary | ICD-10-CM | POA: Diagnosis not present

## 2018-03-14 DIAGNOSIS — Z9989 Dependence on other enabling machines and devices: Secondary | ICD-10-CM

## 2018-03-14 DIAGNOSIS — Z9189 Other specified personal risk factors, not elsewhere classified: Secondary | ICD-10-CM

## 2018-03-14 DIAGNOSIS — R059 Cough, unspecified: Secondary | ICD-10-CM

## 2018-03-14 LAB — POCT INFLUENZA A/B
Influenza A, POC: NEGATIVE
Influenza B, POC: NEGATIVE

## 2018-03-14 MED ORDER — BENZONATATE 200 MG PO CAPS: 200.0000 mg | ORAL_CAPSULE | Freq: Three times a day (TID) | ORAL | 0 refills | Status: DC | PRN

## 2018-03-14 MED ORDER — PROMETHAZINE HCL 25 MG/ML IJ SOLN
25.0000 mg | Freq: Once | INTRAMUSCULAR | Status: AC
  Administered 2018-03-14: 25 mg via INTRAMUSCULAR

## 2018-03-14 MED ORDER — IPRATROPIUM-ALBUTEROL 0.5-2.5 (3) MG/3ML IN SOLN: 3.0000 mL | Freq: Four times a day (QID) | RESPIRATORY_TRACT | Status: AC

## 2018-03-14 MED ORDER — MECLIZINE HCL 25 MG PO TABS: 25.0000 mg | ORAL_TABLET | Freq: Three times a day (TID) | ORAL | 0 refills | Status: DC | PRN

## 2018-03-14 MED ORDER — PREDNISONE 20 MG PO TABS: 20.0000 mg | ORAL_TABLET | Freq: Two times a day (BID) | ORAL | 0 refills | Status: DC

## 2018-03-14 NOTE — Patient Instructions (Addendum)
I don't see a pneumonia on the Xray, will call you if the radiologist's official interpretation is different.   I think cough is due to COPD exacerbation from viral infection. You tested negative for flu but this may have been a false negative OR you may have a different but similar illness. Many viruses will cause respiratory and GI symptoms in addition to headache/dizziness.   See medications as below which were given in the office today and sent to your pharmacy. Continue your maintenance inhalers, add your nebulizer and/or rescue inhaler a few times per day as long as you are sick.    Meds ordered this encounter  Medications  . promethazine (PHENERGAN) injection 25 mg  . ipratropium-albuterol (DUONEB) 0.5-2.5 (3) MG/3ML nebulizer solution 3 mL  . predniSONE (DELTASONE) 20 MG tablet STEROID FOR COPD     Sig: Take 1 tablet (20 mg total) by mouth 2 (two) times daily with a meal.    Dispense:  10 tablet    Refill:  0  . benzonatate (TESSALON) 200 MG capsule AS NEEDED FOR COUGH    Sig: Take 1 capsule (200 mg total) by mouth 3 (three) times daily as needed for cough.    Dispense:  30 capsule    Refill:  0  . meclizine (ANTIVERT) 25 MG tablet AS NEEDED FOR NAUSEA/DIZZINESS    Sig: Take 1 tablet (25 mg total) by mouth 3 (three) times daily as needed for dizziness.    Dispense:  30 tablet    Refill:  0

## 2018-03-14 NOTE — Progress Notes (Signed)
HPI: Tabitha Estrada is a 48 y.o. female who  has a past medical history of Allergy, Anemia, Anxiety, Arthritis, Asthma, COPD (chronic obstructive pulmonary disease) ([place]), Depression, Diabetes mellitus without complication ([place]), Dyspnea, Fibromyalgia, GERD (gastroesophageal reflux disease), History of hiatal hernia, History of kidney stones, Hyperlipidemia, Neuromuscular disorder ([place]), Pneumonia, and Sleep apnea.  she presents to [facility] today, 03/14/18,  for chief complaint of: Cough  Cough and congestion. Fever initially but this has resolved. Was having some loose stools but this has resolved. Accompanied fmaily to the ER a few times, was diligent about wearing mask but worried might have had some flu exposure. Has had flu shot this season. No pneumonia vaccine on file despite COPD history.     Immunization History  Administered Date(s) Administered  . Influenza Whole 12/13/2016  . Influenza, Seasonal, Injecte, Preservative Fre 02/09/2016  . Influenza,inj,Quad PF,6+ Mos 02/09/2016, 12/01/2017        Past medical, surgical, social and family history reviewed and updated as necessary.   Current medication list and allergy/intolerance information reviewed:    Current Outpatient Medications  Medication Sig Dispense Refill  . albuterol (PROVENTIL HFA;VENTOLIN HFA) 108 (90 Base) MCG/ACT inhaler Inhale 1-2 puffs into the lungs every 4 (four) hours as needed for wheezing or shortness of breath. 1 Inhaler 12  . albuterol (PROVENTIL) (2.5 MG/3ML) 0.083% nebulizer solution Inhale the contents of one vial in nebulizer every four to six hours as needed for cough or wheeze. 180 mL 1  . atorvastatin (LIPITOR) 80 MG tablet Take 1 tablet (80 mg total) by mouth daily. 90 tablet 3  . azelastine (ASTELIN) 0.1 % nasal spray Place 2 sprays into both nostrils 2 (two) times daily. Use in each nostril as directed 30 mL 5  . azithromycin (ZITHROMAX Z-PAK) 250 MG  tablet Take as directed. 6 each 0  . benzonatate (TESSALON) 200 MG capsule Take 1 capsule (200 mg total) by mouth 3 (three) times daily as needed for cough. 45 capsule 1  . BIOTIN PO Take by mouth daily.    . budesonide-formoterol (SYMBICORT) 160-4.5 MCG/ACT inhaler Inhale 2 puffs into the lungs 2 (two) times daily. 1 Inhaler 11  . cetirizine (ZYRTEC) 10 MG tablet Can take one tablet by mouth once or twice daily if needed. 90 tablet 3  . clobetasol ointment (TEMOVATE) 4.09 % Apply 1 application topically 2 (two) times daily. 60 g 0  . docusate sodium (COLACE) 100 MG capsule Take 1 capsule (100 mg total) by mouth 2 (two) times daily. 60 capsule 0  . fluticasone (FLONASE) 50 MCG/ACT nasal spray Use two sprays in each nostril once daily as directed. 16 g 5  . folic acid (FOLVITE) 1 MG tablet Take 1 mg by mouth daily.    . Glucose Blood (BLOOD GLUCOSE TEST STRIPS) STRP 1 each by Other route 3 (three) times a day. Use as instructed    . glucose blood (CONTOUR NEXT TEST) test strip Used to check blood sugars tree times daily. 100 each 12  . insulin aspart (NOVOLOG FLEXPEN) 100 UNIT/ML FlexPen Inject 40 Units into the skin daily with supper. 15 mL 6  . Insulin Glargine (LANTUS SOLOSTAR) 100 UNIT/ML Solostar Pen Inject 140 Units into the skin daily. And pen needles 5/day 20 pen PRN  . Insulin Pen Needle 32G X 4 MM MISC by Does not apply route.    Marland Kitchen ipratropium (ATROVENT) 0.06 % nasal spray Place 2 sprays into both nostrils every 4 (four)  hours as needed for rhinitis. 10 mL 6  . ipratropium-albuterol (DUONEB) 0.5-2.5 (3) MG/3ML SOLN Take 3 mLs by nebulization every 4 (four) hours as needed (wheeze, SOB). 60 mL 3  . meclizine (ANTIVERT) 25 MG tablet Take 1 tablet (25 mg total) by mouth 3 (three) times daily as needed for dizziness. 30 tablet 2  . montelukast (SINGULAIR) 10 MG tablet Take 1 tablet (10 mg total) by mouth daily. 90 tablet 3  . nicotine (NICOTROL) 10 MG inhaler Inhale 1 Cartridge (1 continuous  puffing total) into the lungs as needed for smoking cessation. 42 each 0  . nystatin (MYCOSTATIN/NYSTOP) powder Apply 1 g topically 2 (two) times daily as needed. 45 g 2  . omeprazole (PRILOSEC) 40 MG capsule Take 1 capsule (40 mg total) by mouth every morning. 90 capsule 3  . ondansetron (ZOFRAN-ODT) 8 MG disintegrating tablet Dissolve 1 tablet (8 mg total) by mouth every 8 (eight) hours as needed for nausea.    . predniSONE (DELTASONE) 20 MG tablet Take 1 tablet (20 mg total) by mouth daily with breakfast. 5 tablet 0  . pregabalin (LYRICA) 200 MG capsule TAKE 1 CAPSULE BY MOUTH 2 TIMES DAILY. 60 capsule 5  . ranitidine (ZANTAC) 300 MG tablet Take 1 tablet (300 mg total) by mouth at bedtime. 90 tablet 3  . tamsulosin (FLOMAX) 0.4 MG CAPS capsule Take 0.4 mg by mouth.    . valACYclovir (VALTREX) 500 MG tablet Take 1 tablet (500 mg total) by mouth at bedtime. 90 tablet 3  . varenicline (CHANTIX STARTING MONTH PAK) 0.5 MG X 11 & 1 MG X 42 tablet Take one 0.5mg  tablet by mouth qd for 3 days, then increase to one 0.5mg  tablet bid for 3 days, then increase to one 1mg  tablet bid. 53 tablet 0   No current facility-administered medications for this visit.     Allergies  Allergen Reactions  . Dulaglutide Other (See Comments)    Severe stomach pain  . Sulfur Nausea And Vomiting    SULFA  . Metformin And Related Diarrhea      Review of Systems:  Constitutional:  +fever has resolved, no chills, +recent illness, No unintentional weight changes. +significant fatigue.   HEENT: +headache, no vision change, no hearing change, No sore throat, +sinus pressure  Cardiac: No  chest pain, No  pressure, No palpitations  Respiratory:  No  shortness of breath. +Cough  Gastrointestinal: No  abdominal pain, +nausea/vomiting as resolved,  No  blood in stool, +diarrhea as resolves  Musculoskeletal: No new myalgia/arthralgia  Skin: No  Rash  Neurologic: No  weakness, No  dizziness  Exam:  BP (!) 144/72  (BP Location: Left Arm, Patient Position: Sitting, Cuff Size: Large)   Pulse 76   Temp 98.1 F (36.7 C) (Oral)   Wt 261 lb 9.6 oz (118.7 kg)   LMP 11/17/2016 (Approximate)   SpO2 98%   BMI 43.53 kg/m   Constitutional: VS see above. General Appearance: alert, well-developed, well-nourished, NAD  Eyes: Normal lids and conjunctive, non-icteric sclera  Ears, Nose, Mouth, Throat: MMM, Normal external inspection ears/nares/mouth/lips/gums. TM normal bilaterally. Pharynx/tonsils no erythema, no exudate. Nasal mucosa normal.   Neck: No masses, trachea midline. No tenderness/mass appreciated. No lymphadenopathy  Respiratory: Normal respiratory effort. no wheeze, no rhonchi, no rales  Cardiovascular: S1/S2 normal, no murmur, no rub/gallop auscultated. RRR. No lower extremity edema.   Gastrointestinal: Nontender, no masses. Bowel sounds normal.  Musculoskeletal: Gait normal.   Neurological: Normal balance/coordination. No tremor.  Skin: warm, dry, intact.   Psychiatric: Normal judgment/insight. Normal mood and affect.   Results for orders placed or performed in visit on 03/14/18 (from the past 72 hour(s))  POCT Influenza A/B     Status: None   Collection Time: 03/14/18 12:09 PM  Result Value Ref Range   Influenza A, POC Negative Negative   Influenza B, POC Negative Negative    No results found. CXR looks ok on personal review. No concerning infiltrate. Await radiology over-read.      ASSESSMENT/PLAN: The primary encounter diagnosis was COPD mixed type ([place]). Diagnoses of OSA on CPAP, Restrictive lung disease, Cough, and At risk for foot problem were also pertinent to this visit.     Orders Placed This Encounter  Procedures  . DG Chest 2 View  . Ambulatory referral to Podiatry  . POCT Influenza A/B    Patient Instructions   I don't see a pneumonia on the Xray, will call you if the radiologist's official interpretation is different.   I think cough is due to COPD  exacerbation from viral infection. You tested negative for flu but this may have been a false negative OR you may have a different but similar illness. Many viruses will cause respiratory and GI symptoms in addition to headache/dizziness.   See medications as below which were given in the office today and sent to your pharmacy. Continue your maintenance inhalers, add your nebulizer and/or rescue inhaler a few times per day as long as you are sick.    Meds ordered this encounter  Medications  . promethazine (PHENERGAN) injection 25 mg  . ipratropium-albuterol (DUONEB) 0.5-2.5 (3) MG/3ML nebulizer solution 3 mL  . predniSONE (DELTASONE) 20 MG tablet STEROID FOR COPD     Sig: Take 1 tablet (20 mg total) by mouth 2 (two) times daily with a meal.    Dispense:  10 tablet    Refill:  0  . benzonatate (TESSALON) 200 MG capsule AS NEEDED FOR COUGH    Sig: Take 1 capsule (200 mg total) by mouth 3 (three) times daily as needed for cough.    Dispense:  30 capsule    Refill:  0  . meclizine (ANTIVERT) 25 MG tablet AS NEEDED FOR NAUSEA/DIZZINESS    Sig: Take 1 tablet (25 mg total) by mouth 3 (three) times daily as needed for dizziness.    Dispense:  30 tablet    Refill:  0         Visit summary with medication list and pertinent instructions was printed for patient to review. All questions at time of visit were answered - patient instructed to contact office with any additional concerns or updates. ER/RTC precautions were reviewed with the patient.   Follow-up plan: Return if symptoms worsen or fail to improve.  Note: Total time spent 25 minutes, greater than 50% of the visit was spent face-to-face counseling and coordinating care for the following: The primary encounter diagnosis was COPD mixed type ([place]). Diagnoses of OSA on CPAP, Restrictive lung disease, Cough, and At risk for foot problem were also pertinent to this visit.Marland Kitchen  Please note: voice recognition software was used to produce this  document, and typos may escape review. Please contact Dr. Sheppard Coil for any needed clarifications.

## 2018-03-15 ENCOUNTER — Other Ambulatory Visit: Payer: Medicaid Other

## 2018-03-15 ENCOUNTER — Ambulatory Visit: Payer: Medicaid Other | Admitting: Family

## 2018-03-24 ENCOUNTER — Inpatient Hospital Stay (HOSPITAL_BASED_OUTPATIENT_CLINIC_OR_DEPARTMENT_OTHER): Payer: Medicaid Other | Admitting: Family

## 2018-03-24 ENCOUNTER — Encounter: Payer: Self-pay | Admitting: Family

## 2018-03-24 ENCOUNTER — Inpatient Hospital Stay: Payer: Medicaid Other | Attending: Family

## 2018-03-24 ENCOUNTER — Other Ambulatory Visit: Payer: Self-pay

## 2018-03-24 VITALS — BP 138/76 | HR 89 | Temp 98.3°F | Resp 19 | Wt 263.0 lb

## 2018-03-24 DIAGNOSIS — D72828 Other elevated white blood cell count: Secondary | ICD-10-CM

## 2018-03-24 DIAGNOSIS — J029 Acute pharyngitis, unspecified: Secondary | ICD-10-CM | POA: Diagnosis not present

## 2018-03-24 DIAGNOSIS — Z79899 Other long term (current) drug therapy: Secondary | ICD-10-CM

## 2018-03-24 DIAGNOSIS — D72829 Elevated white blood cell count, unspecified: Secondary | ICD-10-CM

## 2018-03-24 DIAGNOSIS — D509 Iron deficiency anemia, unspecified: Secondary | ICD-10-CM | POA: Diagnosis present

## 2018-03-24 DIAGNOSIS — Z794 Long term (current) use of insulin: Secondary | ICD-10-CM

## 2018-03-24 DIAGNOSIS — D508 Other iron deficiency anemias: Secondary | ICD-10-CM

## 2018-03-24 DIAGNOSIS — R0602 Shortness of breath: Secondary | ICD-10-CM | POA: Diagnosis not present

## 2018-03-24 DIAGNOSIS — R42 Dizziness and giddiness: Secondary | ICD-10-CM

## 2018-03-24 DIAGNOSIS — D72823 Leukemoid reaction: Secondary | ICD-10-CM

## 2018-03-24 LAB — CBC WITH DIFFERENTIAL (CANCER CENTER ONLY)
Abs Immature Granulocytes: 3.64 10*3/uL — ABNORMAL HIGH (ref 0.00–0.07)
Basophils Absolute: 0.7 10*3/uL — ABNORMAL HIGH (ref 0.0–0.1)
Basophils Relative: 2 %
Eosinophils Absolute: 0.5 10*3/uL (ref 0.0–0.5)
Eosinophils Relative: 2 %
HCT: 43.4 % (ref 36.0–46.0)
Hemoglobin: 13.6 g/dL (ref 12.0–15.0)
IMMATURE GRANULOCYTES: 12 %
Lymphocytes Relative: 10 %
Lymphs Abs: 3 10*3/uL (ref 0.7–4.0)
MCH: 28.6 pg (ref 26.0–34.0)
MCHC: 31.3 g/dL (ref 30.0–36.0)
MCV: 91.2 fL (ref 80.0–100.0)
Monocytes Absolute: 0.7 10*3/uL (ref 0.1–1.0)
Monocytes Relative: 2 %
NEUTROS PCT: 72 %
Neutro Abs: 23 10*3/uL — ABNORMAL HIGH (ref 1.7–7.7)
Platelet Count: 460 10*3/uL — ABNORMAL HIGH (ref 150–400)
RBC: 4.76 MIL/uL (ref 3.87–5.11)
RDW: 18.3 % — ABNORMAL HIGH (ref 11.5–15.5)
WBC Count: 31.6 10*3/uL — ABNORMAL HIGH (ref 4.0–10.5)
nRBC: 0.1 % (ref 0.0–0.2)

## 2018-03-24 LAB — CMP (CANCER CENTER ONLY)
ALT: 21 U/L (ref 0–44)
AST: 15 U/L (ref 15–41)
Albumin: 4.2 g/dL (ref 3.5–5.0)
Alkaline Phosphatase: 99 U/L (ref 38–126)
Anion gap: 8 (ref 5–15)
BUN: 11 mg/dL (ref 6–20)
CO2: 31 mmol/L (ref 22–32)
Calcium: 9.6 mg/dL (ref 8.9–10.3)
Chloride: 102 mmol/L (ref 98–111)
Creatinine: 0.76 mg/dL (ref 0.44–1.00)
GFR, Est AFR Am: >60 mL/min (ref >60)
GFR, Estimated: >60 mL/min (ref >60)
Glucose, Bld: 144 mg/dL — ABNORMAL HIGH (ref 70–99)
Potassium: 3.7 mmol/L (ref 3.5–5.1)
Sodium: 141 mmol/L (ref 135–145)
Total Bilirubin: 0.4 mg/dL (ref 0.3–1.2)
Total Protein: 6.2 g/dL — ABNORMAL LOW (ref 6.5–8.1)

## 2018-03-24 NOTE — Progress Notes (Signed)
Hematology and Oncology Follow Up Visit  Tabitha Estrada [id number] 1970/10/16 48 y.o. 03/24/2018   Principle Diagnosis:  Reactive leukocytosis Iron deficiency anemia  Current Therapy:   IV iron as indicated   Interim History:  Tabitha Estrada is here today with her husband for follow-up. She finished her prednisone dose pack this week on Tuesday for an upper respiratory infection after a mild case of the flu 3 weeks ago.  She still has fatigue, SOB with over exertion and cough with yellow phlegm.  Her sore throat has improved and is only hoarse now.  She does not have a cycle anymore.  She had a nose bleed last week but states she feels it may be due to the dry air and she is getting a humidifier.  She is still smoking.  She has had some episodes of vertigo and takes Antivert as needed.  She has also has occasional headaches in "the back" of her head and will taka Tylenol if needed.  She is on Valtrex for a canker sore on the right upper lip.  No fever, chills, n/v, rash, chest pain, palpitations, abdominal pain or changes in bowel or bladder habits.  No swelling  in her extremities. She has occasional numbness and tingling in her right hand.   ECOG Performance Status: 1 - Symptomatic but completely ambulatory  Medications:  Allergies as of 03/24/2018      Reactions   Dulaglutide Other (See Comments)   Severe stomach pain   Sulfur Nausea And Vomiting   SULFA   Metformin And Related Diarrhea      Medication List       Accurate as of March 24, 2018  1:51 PM. Always use your most recent med list.        albuterol 108 (90 Base) MCG/ACT inhaler Commonly known as:  PROVENTIL HFA;VENTOLIN HFA Inhale 1-2 puffs into the lungs every 4 (four) hours as needed for wheezing or shortness of breath.   albuterol (2.5 MG/3ML) 0.083% nebulizer solution Commonly known as:  PROVENTIL Inhale the contents of one vial in nebulizer every four to six hours as needed for cough or wheeze.     atorvastatin 80 MG tablet Commonly known as:  LIPITOR Take 1 tablet (80 mg total) by mouth daily.   azelastine 0.1 % nasal spray Commonly known as:  ASTELIN Place 2 sprays into both nostrils 2 (two) times daily. Use in each nostril as directed   azithromycin 250 MG tablet Commonly known as:  ZITHROMAX Z-PAK Take as directed.   benzonatate 200 MG capsule Commonly known as:  TESSALON Take 1 capsule (200 mg total) by mouth 3 (three) times daily as needed for cough.   BIOTIN PO Take by mouth daily.   BLOOD GLUCOSE TEST STRIPS Strp 1 each by Other route 3 (three) times a day. Use as instructed   glucose blood test strip Commonly known as:  CONTOUR NEXT TEST Used to check blood sugars tree times daily.   budesonide-formoterol 160-4.5 MCG/ACT inhaler Commonly known as:  SYMBICORT Inhale 2 puffs into the lungs 2 (two) times daily.   cetirizine 10 MG tablet Commonly known as:  ZYRTEC Can take one tablet by mouth once or twice daily if needed.   clobetasol ointment 0.05 % Commonly known as:  TEMOVATE Apply 1 application topically 2 (two) times daily.   docusate sodium 100 MG capsule Commonly known as:  COLACE Take 1 capsule (100 mg total) by mouth 2 (two) times daily.   FLOMAX 0.4  MG Caps capsule Generic drug:  tamsulosin Take 0.4 mg by mouth.   fluticasone 50 MCG/ACT nasal spray Commonly known as:  FLONASE Use two sprays in each nostril once daily as directed.   folic acid 1 MG tablet Commonly known as:  FOLVITE Take 1 mg by mouth daily.   insulin aspart 100 UNIT/ML FlexPen Commonly known as:  NOVOLOG FLEXPEN Inject 40 Units into the skin daily with supper.   Insulin Glargine 100 UNIT/ML Solostar Pen Commonly known as:  LANTUS SOLOSTAR Inject 140 Units into the skin daily. And pen needles 5/day   Insulin Pen Needle 32G X 4 MM Misc by Does not apply route.   ipratropium 0.06 % nasal spray Commonly known as:  ATROVENT Place 2 sprays into both nostrils every 4  (four) hours as needed for rhinitis.   ipratropium-albuterol 0.5-2.5 (3) MG/3ML Soln Commonly known as:  DUONEB Take 3 mLs by nebulization every 4 (four) hours as needed (wheeze, SOB).   meclizine 25 MG tablet Commonly known as:  ANTIVERT Take 1 tablet (25 mg total) by mouth 3 (three) times daily as needed for dizziness.   montelukast 10 MG tablet Commonly known as:  SINGULAIR Take 1 tablet (10 mg total) by mouth daily.   nicotine 10 MG inhaler Commonly known as:  NICOTROL Inhale 1 Cartridge (1 continuous puffing total) into the lungs as needed for smoking cessation.   nystatin powder Commonly known as:  MYCOSTATIN/NYSTOP Apply 1 g topically 2 (two) times daily as needed.   omeprazole 40 MG capsule Commonly known as:  PRILOSEC Take 1 capsule (40 mg total) by mouth every morning.   ondansetron 8 MG disintegrating tablet Commonly known as:  ZOFRAN-ODT Dissolve 1 tablet (8 mg total) by mouth every 8 (eight) hours as needed for nausea.   predniSONE 20 MG tablet Commonly known as:  DELTASONE Take 1 tablet (20 mg total) by mouth 2 (two) times daily with a meal.   pregabalin 200 MG capsule Commonly known as:  LYRICA TAKE 1 CAPSULE BY MOUTH 2 TIMES DAILY.   ranitidine 300 MG tablet Commonly known as:  ZANTAC Take 1 tablet (300 mg total) by mouth at bedtime.   valACYclovir 500 MG tablet Commonly known as:  VALTREX Take 1 tablet (500 mg total) by mouth at bedtime.   varenicline 0.5 MG X 11 & 1 MG X 42 tablet Commonly known as:  CHANTIX STARTING MONTH PAK Take one 0.5mg  tablet by mouth qd for 3 days, then increase to one 0.5mg  tablet bid for 3 days, then increase to one 1mg  tablet bid.       Allergies:  Allergies  Allergen Reactions  . Dulaglutide Other (See Comments)    Severe stomach pain  . Sulfur Nausea And Vomiting    SULFA  . Metformin And Related Diarrhea    Past Medical History, Surgical history, Social history, and Family History were reviewed and  updated.  Review of Systems: All other 10 point review of systems is negative.   Physical Exam:  vitals were not taken for this visit.   Wt Readings from Last 3 Encounters:  03/14/18 261 lb 9.6 oz (118.7 kg)  02/14/18 268 lb (121.6 kg)  01/09/18 260 lb (117.9 kg)    Ocular: Sclerae unicteric, pupils equal, round and reactive to light Ear-nose-throat: Oropharynx clear, dentition fair Lymphatic: No cervical, supraclavicular or axillary adenopathy Lungs no rales or rhonchi, good excursion bilaterally Heart regular rate and rhythm, no murmur appreciated Abd soft, nontender, positive bowel sounds,  no liver or spleen tip palpated on exam, no fluid wave  MSK no focal spinal tenderness, no joint edema Neuro: non-focal, well-oriented, appropriate affect Breasts: Deferred   Lab Results  Component Value Date   WBC 21.6 (H) 07/28/2017   HGB 13.3 07/28/2017   HCT 42.7 07/28/2017   MCV 90.9 07/28/2017   PLT 390 07/28/2017   Lab Results  Component Value Date   FERRITIN 124 07/28/2017   IRON 45 07/28/2017   TIBC 273 07/28/2017   UIBC 228 07/28/2017   IRONPCTSAT 16 (L) 07/28/2017   Lab Results  Component Value Date   RBC 4.70 07/28/2017   No results found for: Nils Pyle, [facility] Lab Results  Component Value Date   IGGSERUM 675 (L) 11/10/2016   IGMSERUM 86 11/10/2016   No results found for: Odetta Pink, SPEI   Chemistry      Component Value Date/Time   NA 138 07/28/2017 1122   NA 144 01/27/2017 1302   NA 138 09/16/2016 1105   K 4.7 07/28/2017 1122   K 4.3 01/27/2017 1302   K 4.2 09/16/2016 1105   CL 98 07/28/2017 1122   CL 102 01/27/2017 1302   CO2 27 07/28/2017 1122   CO2 27 01/27/2017 1302   CO2 26 09/16/2016 1105   BUN 14 07/28/2017 1122   BUN 14 01/27/2017 1302   BUN 11.9 09/16/2016 1105   CREATININE 1.02 07/28/2017 1122   CREATININE 1.1 01/27/2017 1302   CREATININE 0.8 09/16/2016 1105       Component Value Date/Time   CALCIUM 9.6 07/28/2017 1122   CALCIUM 9.5 01/27/2017 1302   CALCIUM 9.0 09/16/2016 1105   ALKPHOS 119 07/28/2017 1122   ALKPHOS 116 (H) 01/27/2017 1302   ALKPHOS 136 09/16/2016 1105   AST 12 07/28/2017 1122   AST 8 09/16/2016 1105   ALT 16 07/28/2017 1122   ALT 25 01/27/2017 1302   ALT 12 09/16/2016 1105   BILITOT 0.4 07/28/2017 1122   BILITOT 0.32 09/16/2016 1105       Impression and Plan: Tabitha Estrada is a very pleasant 48 yo caucasian female with reactive leukocytosis as well as iron deficiency.  She finished a steroid dose pack earlier this week for an upper respiratory infection and WBC count is elevated at 31.6.  Hgb is stable t 13.6 and platelet count is mildly elevated at 460.  We will see what her iron studies show and bring her back in for infusion if needed.  We will plan to see her back in another 6 weeks for follow-up.  She will contact our office with any questions or concerns. We can certainly see her sooner if need be.   Laverna [name], NP 1/24/20201:51 PM

## 2018-03-27 ENCOUNTER — Telehealth: Payer: Self-pay | Admitting: Family

## 2018-03-27 LAB — FERRITIN: Ferritin: 295 ng/mL (ref 11–307)

## 2018-03-27 LAB — IRON AND TIBC
Iron: 48 ug/dL (ref 41–142)
Saturation Ratios: 20 % — ABNORMAL LOW (ref 21–57)
TIBC: 239 ug/dL (ref 236–444)
UIBC: 191 ug/dL (ref 120–384)

## 2018-03-27 NOTE — Telephone Encounter (Signed)
Spoke with patient regarding appointment added for Iron infusion per 1/27 sch msg

## 2018-03-31 ENCOUNTER — Inpatient Hospital Stay: Payer: Medicaid Other

## 2018-03-31 VITALS — BP 134/66 | HR 71 | Temp 98.5°F | Resp 18

## 2018-03-31 DIAGNOSIS — D508 Other iron deficiency anemias: Secondary | ICD-10-CM

## 2018-03-31 DIAGNOSIS — D509 Iron deficiency anemia, unspecified: Secondary | ICD-10-CM | POA: Diagnosis not present

## 2018-03-31 MED ORDER — SODIUM CHLORIDE 0.9 % IV SOLN
510.0000 mg | Freq: Once | INTRAVENOUS | Status: AC
  Administered 2018-03-31: 510 mg via INTRAVENOUS
  Filled 2018-03-31: qty 17

## 2018-03-31 MED ORDER — SODIUM CHLORIDE 0.9 % IV SOLN
INTRAVENOUS | Status: DC
  Administered 2018-03-31: 11:00:00 via INTRAVENOUS
  Filled 2018-03-31: qty 250

## 2018-03-31 NOTE — Progress Notes (Signed)
Pt refused to stay for 30 minute observation post Feraheme.  Pt without complaints at time of discharge.  

## 2018-05-05 ENCOUNTER — Inpatient Hospital Stay: Payer: Medicaid Other | Attending: Family | Admitting: Family

## 2018-05-05 ENCOUNTER — Telehealth: Payer: Self-pay | Admitting: Hematology & Oncology

## 2018-05-05 ENCOUNTER — Inpatient Hospital Stay: Payer: Medicaid Other

## 2018-05-05 VITALS — BP 126/62 | HR 77 | Temp 99.0°F | Resp 19 | Ht 65.0 in | Wt 260.0 lb

## 2018-05-05 DIAGNOSIS — D508 Other iron deficiency anemias: Secondary | ICD-10-CM

## 2018-05-05 DIAGNOSIS — K219 Gastro-esophageal reflux disease without esophagitis: Secondary | ICD-10-CM | POA: Diagnosis not present

## 2018-05-05 DIAGNOSIS — Z791 Long term (current) use of non-steroidal anti-inflammatories (NSAID): Secondary | ICD-10-CM | POA: Insufficient documentation

## 2018-05-05 DIAGNOSIS — R5383 Other fatigue: Secondary | ICD-10-CM | POA: Diagnosis not present

## 2018-05-05 DIAGNOSIS — J449 Chronic obstructive pulmonary disease, unspecified: Secondary | ICD-10-CM | POA: Diagnosis not present

## 2018-05-05 DIAGNOSIS — G629 Polyneuropathy, unspecified: Secondary | ICD-10-CM | POA: Diagnosis not present

## 2018-05-05 DIAGNOSIS — C9210 Chronic myeloid leukemia, BCR/ABL-positive, not having achieved remission: Secondary | ICD-10-CM | POA: Diagnosis present

## 2018-05-05 DIAGNOSIS — D509 Iron deficiency anemia, unspecified: Secondary | ICD-10-CM | POA: Insufficient documentation

## 2018-05-05 DIAGNOSIS — Z794 Long term (current) use of insulin: Secondary | ICD-10-CM

## 2018-05-05 DIAGNOSIS — E119 Type 2 diabetes mellitus without complications: Secondary | ICD-10-CM | POA: Diagnosis not present

## 2018-05-05 DIAGNOSIS — Z79899 Other long term (current) drug therapy: Secondary | ICD-10-CM | POA: Insufficient documentation

## 2018-05-05 DIAGNOSIS — D72823 Leukemoid reaction: Secondary | ICD-10-CM

## 2018-05-05 DIAGNOSIS — F17210 Nicotine dependence, cigarettes, uncomplicated: Secondary | ICD-10-CM | POA: Diagnosis not present

## 2018-05-05 LAB — CBC WITH DIFFERENTIAL (CANCER CENTER ONLY)
ABS IMMATURE GRANULOCYTES: 7.25 10*3/uL — AB (ref 0.00–0.07)
Basophils Absolute: 1.7 10*3/uL — ABNORMAL HIGH (ref 0.0–0.1)
Basophils Relative: 4 %
Eosinophils Absolute: 0.5 10*3/uL (ref 0.0–0.5)
Eosinophils Relative: 1 %
HCT: 43.6 % (ref 36.0–46.0)
Hemoglobin: 13.8 g/dL (ref 12.0–15.0)
Immature Granulocytes: 18 %
LYMPHS ABS: 4 10*3/uL (ref 0.7–4.0)
Lymphocytes Relative: 10 %
MCH: 29.8 pg (ref 26.0–34.0)
MCHC: 31.7 g/dL (ref 30.0–36.0)
MCV: 94.2 fL (ref 80.0–100.0)
Monocytes Absolute: 1.6 10*3/uL — ABNORMAL HIGH (ref 0.1–1.0)
Monocytes Relative: 4 %
Neutro Abs: 25.9 10*3/uL — ABNORMAL HIGH (ref 1.7–7.7)
Neutrophils Relative %: 63 %
PLATELETS: 452 10*3/uL — AB (ref 150–400)
RBC: 4.63 MIL/uL (ref 3.87–5.11)
RDW: 19 % — ABNORMAL HIGH (ref 11.5–15.5)
WBC Count: 40.8 10*3/uL — ABNORMAL HIGH (ref 4.0–10.5)
nRBC: 0.5 % — ABNORMAL HIGH (ref 0.0–0.2)

## 2018-05-05 LAB — CMP (CANCER CENTER ONLY)
ALT: 22 U/L (ref 0–44)
AST: 14 U/L — ABNORMAL LOW (ref 15–41)
Albumin: 4.2 g/dL (ref 3.5–5.0)
Alkaline Phosphatase: 104 U/L (ref 38–126)
Anion gap: 7 (ref 5–15)
BUN: 15 mg/dL (ref 6–20)
CHLORIDE: 99 mmol/L (ref 98–111)
CO2: 33 mmol/L — ABNORMAL HIGH (ref 22–32)
CREATININE: 1.02 mg/dL — AB (ref 0.44–1.00)
Calcium: 9.8 mg/dL (ref 8.9–10.3)
GFR, Est AFR Am: >60 mL/min (ref >60)
GFR, Estimated: >60 mL/min (ref >60)
Glucose, Bld: 281 mg/dL — ABNORMAL HIGH (ref 70–99)
Potassium: 4.3 mmol/L (ref 3.5–5.1)
Sodium: 139 mmol/L (ref 135–145)
Total Bilirubin: 0.5 mg/dL (ref 0.3–1.2)
Total Protein: 6.8 g/dL (ref 6.5–8.1)

## 2018-05-05 NOTE — Telephone Encounter (Signed)
Appointments scheduled/ unable to print due to printer issues/ patient was given handwritten date/time of follow up appointment per 3/6 los

## 2018-05-05 NOTE — Progress Notes (Signed)
Hematology and Oncology Follow Up Visit  JOSHLYN BEADLE [id number] November 07, 1970 48 y.o. 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 Prout is here today for follow-up. She is symptomatic with fatigue despite sleeping well at night.  She is noted to still have the elevated WBC of 40 and is no longer on steroids She is also noted to have basophils at 1.7 and immature granulocytes 7.25. DR. 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  She has had no issue with recent infection. No fever, chills, n/v, cough, rash, dizziness, chest pain, palpitations, abdominal pain or changes in bowel or bladder habits.  She is still smoking 1 ppd. She has some SOB with over exertion secondary to COPD and will take a break to rest as needed.  No swelling or tenderness in her extremities. The neuropathy in her feet is unchanged. She has numbness and tingling in the light foot since back surgery years ago.  No lymphadenopathy noted on exam.  She has GERD and sometimes has issues swallowing. I advised she follow up with her GI Dr. Silverio Decamp for possible endoscopy.  She is staying hydrated. Her weight is stable.  She states that her blood sugars are not well controlled.   ECOG Performance Status: 1 - Symptomatic but completely ambulatory  Medications:  Allergies as of 05/05/2018      Reactions   Dulaglutide Other (See Comments), Nausea And Vomiting   Severe stomach pain Severe stomach pain   Sulfur Nausea And Vomiting   SULFA   Metformin Diarrhea   Metformin And Related Diarrhea      Medication List       Accurate as of May 05, 2018 11:53 AM. Always use your most recent med list.        albuterol 108 (90 Base) MCG/ACT inhaler Commonly known as:  PROVENTIL HFA;VENTOLIN HFA Inhale 1-2 puffs into the lungs every 4 (four) hours as needed for wheezing  or shortness of breath.   albuterol (2.5 MG/3ML) 0.083% nebulizer solution Commonly known as:  PROVENTIL Inhale the contents of one vial in nebulizer every four to six hours as needed for cough or wheeze.   atorvastatin 80 MG tablet Commonly known as:  LIPITOR Take 1 tablet (80 mg total) by mouth daily.   azelastine 0.1 % nasal spray Commonly known as:  ASTELIN Place 2 sprays into both nostrils 2 (two) times daily. Use in each nostril as directed   benzonatate 200 MG capsule Commonly known as:  TESSALON Take 1 capsule (200 mg total) by mouth 3 (three) times daily as needed for cough.   BIOTIN PO Take by mouth daily.   BLOOD GLUCOSE TEST STRIPS Strp 1 each by Other route 3 (three) times a day. Use as instructed   glucose blood test strip Commonly known as:  Contour Next Test Used to check blood sugars tree times daily.   budesonide-formoterol 160-4.5 MCG/ACT inhaler Commonly known as:  SYMBICORT Inhale 2 puffs into the lungs 2 (two) times daily.   cetirizine 10 MG tablet Commonly known as:  ZYRTEC Can take one tablet by mouth once or twice daily if needed.   clobetasol ointment 0.05 % Commonly known as:  TEMOVATE Apply 1 application topically 2 (two) times daily.   docusate sodium 100 MG capsule Commonly known as:  COLACE Take 1  capsule (100 mg total) by mouth 2 (two) times daily.   Flomax 0.4 MG Caps capsule Generic drug:  tamsulosin Take 0.4 mg by mouth.   fluticasone 50 MCG/ACT nasal spray Commonly known as:  FLONASE Use two sprays in each nostril once daily as directed.   folic acid 1 MG tablet Commonly known as:  FOLVITE Take 1 mg by mouth daily.   insulin aspart 100 UNIT/ML FlexPen Commonly known as:  NovoLOG FlexPen Inject 40 Units into the skin daily with supper.   Insulin Glargine 100 UNIT/ML Solostar Pen Commonly known as:  Lantus SoloStar Inject 140 Units into the skin daily. And pen needles 5/day   Insulin Pen Needle 32G X 4 MM Misc by Does  not apply route.   ipratropium 0.06 % nasal spray Commonly known as:  Atrovent Place 2 sprays into both nostrils every 4 (four) hours as needed for rhinitis.   meclizine 25 MG tablet Commonly known as:  ANTIVERT Take 1 tablet (25 mg total) by mouth 3 (three) times daily as needed for dizziness.   meloxicam 7.5 MG tablet Commonly known as:  MOBIC Take 7.5 mg by mouth daily.   montelukast 10 MG tablet Commonly known as:  SINGULAIR Take 1 tablet (10 mg total) by mouth daily.   nystatin powder Commonly known as:  MYCOSTATIN/NYSTOP Apply 1 g topically 2 (two) times daily as needed.   omeprazole 40 MG capsule Commonly known as:  PRILOSEC Take 1 capsule (40 mg total) by mouth every morning.   ondansetron 8 MG disintegrating tablet Commonly known as:  ZOFRAN-ODT Dissolve 1 tablet (8 mg total) by mouth every 8 (eight) hours as needed for nausea.   pregabalin 200 MG capsule Commonly known as:  LYRICA TAKE 1 CAPSULE BY MOUTH 2 TIMES DAILY.   ranitidine 300 MG tablet Commonly known as:  ZANTAC Take 1 tablet (300 mg total) by mouth at bedtime.   valACYclovir 500 MG tablet Commonly known as:  VALTREX Take 1 tablet (500 mg total) by mouth at bedtime.   varenicline 0.5 MG X 11 & 1 MG X 42 tablet Commonly known as:  Chantix Starting Month Pak Take one 0.5mg  tablet by mouth qd for 3 days, then increase to one 0.5mg  tablet bid for 3 days, then increase to one 1mg  tablet bid.       Allergies:  Allergies  Allergen Reactions  . Dulaglutide Other (See Comments) and Nausea And Vomiting    Severe stomach pain Severe stomach pain  . Sulfur Nausea And Vomiting    SULFA  . Metformin Diarrhea  . Metformin And Related Diarrhea    Past Medical History, Surgical history, Social history, and Family History were reviewed and updated.  Review of Systems: All other 10 point review of systems is negative.   Physical Exam:  height is 5\' 5"  (1.651 m) and weight is 260 lb (117.9 kg). Her  oral temperature is 99 F (37.2 C). Her blood pressure is 126/62 and her pulse is 77. Her respiration is 19 and oxygen saturation is 95%.   Wt Readings from Last 3 Encounters:  05/05/18 260 lb (117.9 kg)  03/24/18 263 lb (119.3 kg)  03/14/18 261 lb 9.6 oz (118.7 kg)    Ocular: Sclerae unicteric, pupils equal, round and reactive to light Ear-nose-throat: Oropharynx clear, dentition fair Lymphatic: No cervical, supraclavicular or axillary adenopathy Lungs no rales or rhonchi, good excursion bilaterally Heart regular rate and rhythm, no murmur appreciated Abd soft, nontender, positive bowel sounds, no liver  or spleen tip palpated on exam, no fluid wave  MSK no focal spinal tenderness, no joint edema Neuro: non-focal, well-oriented, appropriate affect Breasts: Deferred   Lab Results  Component Value Date   WBC 40.8 (H) 05/05/2018   HGB 13.8 05/05/2018   HCT 43.6 05/05/2018   MCV 94.2 05/05/2018   PLT 452 (H) 05/05/2018   Lab Results  Component Value Date   FERRITIN 295 03/24/2018   IRON 48 03/24/2018   TIBC 239 03/24/2018   UIBC 191 03/24/2018   IRONPCTSAT 20 (L) 03/24/2018   Lab Results  Component Value Date   RBC 4.63 05/05/2018   No results found for: Nils Pyle, [facility] Lab Results  Component Value Date   IGGSERUM 675 (L) 11/10/2016   IGMSERUM 86 11/10/2016   No results found for: Odetta Pink, SPEI   Chemistry      Component Value Date/Time   NA 141 03/24/2018 1343   NA 144 01/27/2017 1302   NA 138 09/16/2016 1105   K 3.7 03/24/2018 1343   K 4.3 01/27/2017 1302   K 4.2 09/16/2016 1105   CL 102 03/24/2018 1343   CL 102 01/27/2017 1302   CO2 31 03/24/2018 1343   CO2 27 01/27/2017 1302   CO2 26 09/16/2016 1105   BUN 11 03/24/2018 1343   BUN 14 01/27/2017 1302   BUN 11.9 09/16/2016 1105   CREATININE 0.76 03/24/2018 1343   CREATININE 1.1 01/27/2017 1302   CREATININE 0.8 09/16/2016 1105       Component Value Date/Time   CALCIUM 9.6 03/24/2018 1343   CALCIUM 9.5 01/27/2017 1302   CALCIUM 9.0 09/16/2016 1105   ALKPHOS 99 03/24/2018 1343   ALKPHOS 116 (H) 01/27/2017 1302   ALKPHOS 136 09/16/2016 1105   AST 15 03/24/2018 1343   AST 8 09/16/2016 1105   ALT 21 03/24/2018 1343   ALT 25 01/27/2017 1302   ALT 12 09/16/2016 1105   BILITOT 0.4 03/24/2018 1343   BILITOT 0.32 09/16/2016 1105       Impression and Plan: Ms. 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  She is symptomatic with fatigue.  BCR-ABL is pending. We will plan to see her back in a month.  We will see what her iron studies show and bring her back in for infusion if needed.  We will also get an [place] to evaluate for splenomegaly.  We discussed CML and went through some education material which I sent home with her. No further questions at this time. She will contact our office with any questions or concerns. We can certainly see her sooner if needed.   Laverna [name], NP 3/6/202011:53 AM

## 2018-05-08 LAB — IRON AND TIBC
Iron: 80 ug/dL (ref 41–142)
Saturation Ratios: 33 % (ref 21–57)
TIBC: 244 ug/dL (ref 236–444)
UIBC: 164 ug/dL (ref 120–384)

## 2018-05-08 LAB — FERRITIN: Ferritin: 381 ng/mL — ABNORMAL HIGH (ref 11–307)

## 2018-05-12 ENCOUNTER — Other Ambulatory Visit: Payer: Self-pay

## 2018-05-12 ENCOUNTER — Ambulatory Visit (INDEPENDENT_AMBULATORY_CARE_PROVIDER_SITE_OTHER): Payer: Medicaid Other

## 2018-05-12 DIAGNOSIS — C9210 Chronic myeloid leukemia, BCR/ABL-positive, not having achieved remission: Secondary | ICD-10-CM | POA: Diagnosis not present

## 2018-05-15 ENCOUNTER — Telehealth: Payer: Self-pay | Admitting: Hematology & Oncology

## 2018-05-15 NOTE — Telephone Encounter (Signed)
Called and spoke with patient regarding appointments added per 3/6 los

## 2018-05-17 LAB — BCR ABL1 FISH (GENPATH)

## 2018-05-31 ENCOUNTER — Inpatient Hospital Stay: Payer: Medicaid Other | Attending: Family

## 2018-05-31 ENCOUNTER — Telehealth: Payer: Self-pay | Admitting: *Deleted

## 2018-05-31 ENCOUNTER — Inpatient Hospital Stay (HOSPITAL_BASED_OUTPATIENT_CLINIC_OR_DEPARTMENT_OTHER): Payer: Medicaid Other | Admitting: Family

## 2018-05-31 ENCOUNTER — Encounter: Payer: Self-pay | Admitting: Family

## 2018-05-31 ENCOUNTER — Other Ambulatory Visit: Payer: Self-pay

## 2018-05-31 VITALS — BP 150/73 | HR 72 | Temp 98.5°F | Resp 20 | Wt 262.4 lb

## 2018-05-31 DIAGNOSIS — M7989 Other specified soft tissue disorders: Secondary | ICD-10-CM | POA: Diagnosis not present

## 2018-05-31 DIAGNOSIS — E1165 Type 2 diabetes mellitus with hyperglycemia: Secondary | ICD-10-CM | POA: Diagnosis not present

## 2018-05-31 DIAGNOSIS — D509 Iron deficiency anemia, unspecified: Secondary | ICD-10-CM | POA: Insufficient documentation

## 2018-05-31 DIAGNOSIS — H1131 Conjunctival hemorrhage, right eye: Secondary | ICD-10-CM | POA: Diagnosis not present

## 2018-05-31 DIAGNOSIS — Z794 Long term (current) use of insulin: Secondary | ICD-10-CM

## 2018-05-31 DIAGNOSIS — E1140 Type 2 diabetes mellitus with diabetic neuropathy, unspecified: Secondary | ICD-10-CM

## 2018-05-31 DIAGNOSIS — Z79899 Other long term (current) drug therapy: Secondary | ICD-10-CM | POA: Insufficient documentation

## 2018-05-31 DIAGNOSIS — R609 Edema, unspecified: Secondary | ICD-10-CM | POA: Insufficient documentation

## 2018-05-31 DIAGNOSIS — R109 Unspecified abdominal pain: Secondary | ICD-10-CM

## 2018-05-31 DIAGNOSIS — H538 Other visual disturbances: Secondary | ICD-10-CM | POA: Diagnosis not present

## 2018-05-31 DIAGNOSIS — C9210 Chronic myeloid leukemia, BCR/ABL-positive, not having achieved remission: Secondary | ICD-10-CM | POA: Diagnosis not present

## 2018-05-31 DIAGNOSIS — J449 Chronic obstructive pulmonary disease, unspecified: Secondary | ICD-10-CM | POA: Insufficient documentation

## 2018-05-31 DIAGNOSIS — R61 Generalized hyperhidrosis: Secondary | ICD-10-CM | POA: Insufficient documentation

## 2018-05-31 DIAGNOSIS — H05223 Edema of bilateral orbit: Secondary | ICD-10-CM | POA: Insufficient documentation

## 2018-05-31 HISTORY — DX: Chronic myeloid leukemia, BCR/ABL-positive, not having achieved remission: C92.10

## 2018-05-31 LAB — CBC WITH DIFFERENTIAL (CANCER CENTER ONLY)
Abs Immature Granulocytes: 11.28 10*3/uL — ABNORMAL HIGH (ref 0.00–0.07)
Basophils Absolute: 2.8 10*3/uL — ABNORMAL HIGH (ref 0.0–0.1)
Basophils Relative: 5 %
Eosinophils Absolute: 0.4 10*3/uL (ref 0.0–0.5)
Eosinophils Relative: 1 %
HCT: 39.5 % (ref 36.0–46.0)
Hemoglobin: 12.5 g/dL (ref 12.0–15.0)
Immature Granulocytes: 19 %
Lymphocytes Relative: 9 %
Lymphs Abs: 5.4 10*3/uL — ABNORMAL HIGH (ref 0.7–4.0)
MCH: 29.8 pg (ref 26.0–34.0)
MCHC: 31.6 g/dL (ref 30.0–36.0)
MCV: 94 fL (ref 80.0–100.0)
Monocytes Absolute: 2.3 10*3/uL — ABNORMAL HIGH (ref 0.1–1.0)
Monocytes Relative: 4 %
Neutro Abs: 36.2 10*3/uL — ABNORMAL HIGH (ref 1.7–7.7)
Neutrophils Relative %: 62 %
Platelet Count: 298 10*3/uL (ref 150–400)
RBC: 4.2 MIL/uL (ref 3.87–5.11)
RDW: 19 % — ABNORMAL HIGH (ref 11.5–15.5)
WBC Count: 58.3 10*3/uL (ref 4.0–10.5)
nRBC: 1.2 % — ABNORMAL HIGH (ref 0.0–0.2)

## 2018-05-31 LAB — CMP (CANCER CENTER ONLY)
ALT: 11 U/L (ref 0–44)
AST: 11 U/L — ABNORMAL LOW (ref 15–41)
Albumin: 3.9 g/dL (ref 3.5–5.0)
Alkaline Phosphatase: 103 U/L (ref 38–126)
Anion gap: 11 (ref 5–15)
BUN: 6 mg/dL (ref 6–20)
CO2: 31 mmol/L (ref 22–32)
Calcium: 8.5 mg/dL — ABNORMAL LOW (ref 8.9–10.3)
Chloride: 98 mmol/L (ref 98–111)
Creatinine: 0.94 mg/dL (ref 0.44–1.00)
GFR, Est AFR Am: >60 mL/min (ref >60)
GFR, Estimated: >60 mL/min (ref >60)
Glucose, Bld: 321 mg/dL — ABNORMAL HIGH (ref 70–99)
Potassium: 3.2 mmol/L — ABNORMAL LOW (ref 3.5–5.1)
Sodium: 140 mmol/L (ref 135–145)
Total Bilirubin: 0.6 mg/dL (ref 0.3–1.2)
Total Protein: 6.5 g/dL (ref 6.5–8.1)

## 2018-05-31 MED ORDER — IMATINIB MESYLATE 400 MG PO TABS: 400.0000 mg | ORAL_TABLET | Freq: Every day | ORAL | 4 refills | Status: DC

## 2018-05-31 NOTE — Progress Notes (Signed)
Hematology and Oncology Follow Up Visit  Tabitha Estrada [id number] [birth year] 48 y.o. 05/31/2018   Principle Diagnosis:  CML Iron deficiency anemia   Current Therapy:   IV iron as indicated    Interim History:  Ms. Tabitha Estrada is here today for follow-up. Her WBC count is now 58.3. BCR/ABL was 11.67% earlier this month.  She is still having fatigue.  She has SOB secondary to COPD that she states has been exacerbated by the pollen this spring.  She has poorly controlled diabetes and has just started seeing a new endocrinologist with [facility], Dr. Baxter Kail. She is wearing a BG monitor for 2 weeks and then goes back to discuss treatment.  She has intermittent left flank pain that she discribes as a quick shocking pain.  US of the abdomen showed normal spleen and fatty liver.   She has some nausea at times but no vomiting.  She describes her appetite as ok and is hydrating well.  She has chronic diarrhea.  No fever, chills, cough, rash, dizziness, chest pain, palpitations or changes in bladder habits.  She has had a few night sweats but no hot flashes.  No episodes of bleeding, no bruising or petechiae.  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  She has puffiness in the feet and ankles that comes and goes. No redness or edema noted on exam. Pedal pulses are 2+.  No lymphadenopathy noted on exam.   ECOG Performance Status: 1 - Symptomatic but completely ambulatory  Medications:  Allergies as of 05/31/2018      Reactions   Dulaglutide Other (See Comments), Nausea And Vomiting   Severe stomach pain Severe stomach pain   Sulfur Nausea And Vomiting   SULFA   Metformin Diarrhea   Metformin And Related Diarrhea      Medication List       Accurate as of May 31, 2018  2:10 PM. Always use your most recent med list.        albuterol 108 (90 Base) MCG/ACT inhaler Commonly known as:  PROVENTIL HFA;VENTOLIN HFA Inhale 1-2 puffs into the lungs every 4 (four) hours as needed  for wheezing or shortness of breath.   albuterol (2.5 MG/3ML) 0.083% nebulizer solution Commonly known as:  PROVENTIL Inhale the contents of one vial in nebulizer every four to six hours as needed for cough or wheeze.   atorvastatin 80 MG tablet Commonly known as:  LIPITOR Take 1 tablet (80 mg total) by mouth daily.   azelastine 0.1 % nasal spray Commonly known as:  ASTELIN Place 2 sprays into both nostrils 2 (two) times daily. Use in each nostril as directed   benzonatate 200 MG capsule Commonly known as:  TESSALON Take 1 capsule (200 mg total) by mouth 3 (three) times daily as needed for cough.   BIOTIN PO Take by mouth daily.   BLOOD GLUCOSE TEST STRIPS Strp 1 each by Other route 3 (three) times a day. Use as instructed   glucose blood test strip Commonly known as:  Contour Next Test Used to check blood sugars tree times daily.   budesonide-formoterol 160-4.5 MCG/ACT inhaler Commonly known as:  SYMBICORT Inhale 2 puffs into the lungs 2 (two) times daily.   cetirizine 10 MG tablet Commonly known as:  ZYRTEC Can take one tablet by mouth once or twice daily if needed.   clobetasol ointment 0.05 % Commonly known as:  TEMOVATE Apply 1 application topically 2 (two) times daily.   docusate sodium 100 MG capsule  Commonly known as:  COLACE Take 1 capsule (100 mg total) by mouth 2 (two) times daily.   Flomax 0.4 MG Caps capsule Generic drug:  tamsulosin Take 0.4 mg by mouth.   fluticasone 50 MCG/ACT nasal spray Commonly known as:  FLONASE Use two sprays in each nostril once daily as directed.   folic acid 1 MG tablet Commonly known as:  FOLVITE Take 1 mg by mouth daily.   insulin aspart 100 UNIT/ML FlexPen Commonly known as:  NovoLOG FlexPen Inject 40 Units into the skin daily with supper.   Insulin Glargine 100 UNIT/ML Solostar Pen Commonly known as:  Lantus SoloStar Inject 140 Units into the skin daily. And pen needles 5/day   Insulin Pen Needle 32G X 4 MM  Misc by Does not apply route.   ipratropium 0.06 % nasal spray Commonly known as:  Atrovent Place 2 sprays into both nostrils every 4 (four) hours as needed for rhinitis.   meclizine 25 MG tablet Commonly known as:  ANTIVERT Take 1 tablet (25 mg total) by mouth 3 (three) times daily as needed for dizziness.   meloxicam 7.5 MG tablet Commonly known as:  MOBIC Take 7.5 mg by mouth daily.   montelukast 10 MG tablet Commonly known as:  SINGULAIR Take 1 tablet (10 mg total) by mouth daily.   nystatin powder Commonly known as:  MYCOSTATIN/NYSTOP Apply 1 g topically 2 (two) times daily as needed.   omeprazole 40 MG capsule Commonly known as:  PRILOSEC Take 1 capsule (40 mg total) by mouth every morning.   ondansetron 8 MG disintegrating tablet Commonly known as:  ZOFRAN-ODT Dissolve 1 tablet (8 mg total) by mouth every 8 (eight) hours as needed for nausea.   pregabalin 200 MG capsule Commonly known as:  LYRICA TAKE 1 CAPSULE BY MOUTH 2 TIMES DAILY.   ranitidine 300 MG tablet Commonly known as:  ZANTAC Take 1 tablet (300 mg total) by mouth at bedtime.   valACYclovir 500 MG tablet Commonly known as:  VALTREX Take 1 tablet (500 mg total) by mouth at bedtime.   varenicline 0.5 MG X 11 & 1 MG X 42 tablet Commonly known as:  Chantix Starting Month Pak Take one 0.5mg  tablet by mouth qd for 3 days, then increase to one 0.5mg  tablet bid for 3 days, then increase to one 1mg  tablet bid.       Allergies:  Allergies  Allergen Reactions  . Dulaglutide Other (See Comments) and Nausea And Vomiting    Severe stomach pain Severe stomach pain  . Sulfur Nausea And Vomiting    SULFA  . Metformin Diarrhea  . Metformin And Related Diarrhea    Past Medical History, Surgical history, Social history, and Family History were reviewed and updated.  Review of Systems: All other 10 point review of systems is negative.   Physical Exam:  vitals were not taken for this visit.   Wt  Readings from Last 3 Encounters:  05/05/18 260 lb (117.9 kg)  03/24/18 263 lb (119.3 kg)  03/14/18 261 lb 9.6 oz (118.7 kg)    Ocular: Sclerae unicteric, pupils equal, round and reactive to light Ear-nose-throat: Oropharynx clear, dentition fair Lymphatic: No cervical, supraclavicular or axillary adenopathy Lungs no rales or rhonchi, good excursion bilaterally Heart regular rate and rhythm, no murmur appreciated Abd soft, nontender, positive bowel sounds, no liver or spleen tip palpated on exam, no fluid wave  MSK no focal spinal tenderness, no joint edema Neuro: non-focal, well-oriented, appropriate affect Breasts: Deferred  Lab Results  Component Value Date   WBC 58.3 (HH) 05/31/2018   HGB 12.5 05/31/2018   HCT 39.5 05/31/2018   MCV 94.0 05/31/2018   PLT 298 05/31/2018   Lab Results  Component Value Date   FERRITIN 381 (H) 05/05/2018   IRON 80 05/05/2018   TIBC 244 05/05/2018   UIBC 164 05/05/2018   IRONPCTSAT 33 05/05/2018   Lab Results  Component Value Date   RBC 4.20 05/31/2018   No results found for: KPAFRELGTCHN, LAMBDASER, [facility] Lab Results  Component Value Date   IGGSERUM 675 (L) 11/10/2016   IGMSERUM 86 11/10/2016   No results found for: Odetta Pink, SPEI   Chemistry      Component Value Date/Time   NA 140 05/31/2018 1336   NA 144 01/27/2017 1302   NA 138 09/16/2016 1105   K 3.2 (L) 05/31/2018 1336   K 4.3 01/27/2017 1302   K 4.2 09/16/2016 1105   CL 98 05/31/2018 1336   CL 102 01/27/2017 1302   CO2 31 05/31/2018 1336   CO2 27 01/27/2017 1302   CO2 26 09/16/2016 1105   BUN 6 05/31/2018 1336   BUN 14 01/27/2017 1302   BUN 11.9 09/16/2016 1105   CREATININE 0.94 05/31/2018 1336   CREATININE 1.1 01/27/2017 1302   CREATININE 0.8 09/16/2016 1105      Component Value Date/Time   CALCIUM 8.5 (L) 05/31/2018 1336   CALCIUM 9.5 01/27/2017 1302   CALCIUM 9.0 09/16/2016 1105   ALKPHOS 103  05/31/2018 1336   ALKPHOS 116 (H) 01/27/2017 1302   ALKPHOS 136 09/16/2016 1105   AST 11 (L) 05/31/2018 1336   AST 8 09/16/2016 1105   ALT 11 05/31/2018 1336   ALT 25 01/27/2017 1302   ALT 12 09/16/2016 1105   BILITOT 0.6 05/31/2018 1336   BILITOT 0.32 09/16/2016 1105       Impression and Plan: Ms. Hanna is a very pleasant 48 yo caucasian female with iron deficiency and newly diagnosed CML.  WBC count today is 58 and she is still symptomatic with fatigue. Prescription for [place] sent to [facility] outpatient pharmacy by Dr. Marin Olp. We went over possible side effects in detail and no questions at this time. She will let us know immediately if her diarrhea or nausea worsen from baseline.  We will get an ECHO this week.  We will plan to see her back in another 3 weeks for MD follow-up and lab.  She will contact our office with any questions or concerns. We can certainly see her sooner if need be.   Laverna [name], NP 4/1/20202:10 PM

## 2018-05-31 NOTE — Telephone Encounter (Signed)
Jory Ee NP notified of WBC-58.3.  No new orders received at this time.

## 2018-06-01 ENCOUNTER — Other Ambulatory Visit: Payer: Self-pay

## 2018-06-01 ENCOUNTER — Telehealth: Payer: Self-pay | Admitting: Pharmacist

## 2018-06-01 LAB — FERRITIN: Ferritin: 411 ng/mL — ABNORMAL HIGH (ref 11–307)

## 2018-06-01 LAB — IRON AND TIBC
Iron: 44 ug/dL (ref 41–142)
Saturation Ratios: 20 % — ABNORMAL LOW (ref 21–57)
TIBC: 223 ug/dL — ABNORMAL LOW (ref 236–444)
UIBC: 178 ug/dL (ref 120–384)

## 2018-06-01 LAB — CML CHROMOSOME/FISH PROFILE

## 2018-06-01 MED FILL — IMATINIB MESYLATE 400 MG TA: 400 | 30 days supply | Qty: 30 | Fill #0

## 2018-06-01 NOTE — Telephone Encounter (Signed)
Oral Chemotherapy Pharmacist Encounter  [place] will be delivered to the patient by 06/05/18.  Patient Education I spoke with patient for overview of new oral chemotherapy medication: Gleevec (imatinib) for the treatment of newly diagnosed CML, planned duration until disease progression or unacceptable drug toxicity.   Counseled patient on administration, dosing, side effects, monitoring, drug-food interactions, safe handling, storage, and disposal. Patient will take 1 tablet (400 mg total) by mouth daily. Take with meals and large glass of water.  Patient has medicaid so no prior authorization required.   Side effects include but not limited to: rash, diarrhea, N/V, decreased hgb/plt/wbc.    Reviewed with patient importance of keeping a medication schedule and plan for any missed doses.  Tabitha Estrada voiced understanding and appreciation. All questions answered. Medication handout placed in the mail.  Provided patient with [facility] phone number. Patient knows to call the office with questions or concerns. Oral Chemotherapy Navigation Clinic will continue to follow.  Darl Pikes, PharmD, BCPS, [facility] Hematology/Oncology Clinical Pharmacist [facility] [phone]  06/01/2018 1:55 PM

## 2018-06-01 NOTE — Telephone Encounter (Signed)
Oral Oncology Pharmacist Encounter  Received new prescription for Gleevec (imatinib) for the treatment of newly diagnosed CML, planned duration until disease progression or unacceptable drug toxicity.  CBC/CMP from 05/31/2018 assessed, no relevant lab abnormalities. Prescription dose and frequency assessed.   Current medication list in Epic reviewed, a few DDIs with imatinib identified: Imatinib may decrease the concentrations of atorvastatin and tamsulosin. No baseline dose adjustment needed. Monitor patient for decreased effectiveness of atorvastatin and tamsulosin.  Prescription has been e-scribed to the [facility] for benefits analysis and approval.  Oral Oncology Clinic will continue to follow for insurance authorization, copayment issues, initial counseling and start date.  Darl Pikes, PharmD, BCPS, [facility] Hematology/Oncology Clinical Pharmacist [facility] [phone]  06/01/2018 1:02 PM

## 2018-06-02 ENCOUNTER — Telehealth: Payer: Self-pay

## 2018-06-02 NOTE — Telephone Encounter (Signed)
Received call from pt stating that she continues to have diarrhea as discussed during recent office visit with Judson Roch, NP.  Pt has taken Pepto Bismal without result.  Pt to begin treatment with Gleev[place] Monday.   Per Judson Roch, pt to take imodium as directed on box, beginning with 2 tabs now and 1 after each loose stool until resolved. Not to exceed 8 tabs daily.   Patient to contact office if diarrhea does not resolve or returns after beginning [place].  Verbalizes understanding.

## 2018-06-09 ENCOUNTER — Emergency Department (INDEPENDENT_AMBULATORY_CARE_PROVIDER_SITE_OTHER): Payer: Medicaid Other

## 2018-06-09 ENCOUNTER — Other Ambulatory Visit: Payer: Self-pay

## 2018-06-09 ENCOUNTER — Encounter: Payer: Self-pay | Admitting: Emergency Medicine

## 2018-06-09 ENCOUNTER — Emergency Department
Admission: EM | Admit: 2018-06-09 | Discharge: 2018-06-09 | Disposition: A | Discharge status: Home / Self Care | Payer: Medicaid Other | Source: Home / Self Care | Attending: Family Medicine | Admitting: Family Medicine

## 2018-06-09 ENCOUNTER — Telehealth: Payer: Self-pay | Admitting: *Deleted

## 2018-06-09 DIAGNOSIS — L03213 Periorbital cellulitis: Secondary | ICD-10-CM

## 2018-06-09 DIAGNOSIS — J3489 Other specified disorders of nose and nasal sinuses: Secondary | ICD-10-CM | POA: Diagnosis not present

## 2018-06-09 DIAGNOSIS — R51 Headache: Secondary | ICD-10-CM | POA: Diagnosis not present

## 2018-06-09 MED ORDER — CLINDAMYCIN HCL 300 MG PO CAPS: 300.0000 mg | ORAL_CAPSULE | Freq: Three times a day (TID) | ORAL | 0 refills | Status: AC

## 2018-06-09 NOTE — ED Provider Notes (Signed)
[facility]    CSN: [id number] Arrival date & time: 06/09/18  1506     History   Chief Complaint Chief Complaint  Patient presents with  . Eye Problem  . Sore Throat  . sore in nose    HPI Tabitha Estrada is a 48 y.o. female.   Patient complains of four day history of bilateral "watery" eyes and mild swelling/tenderness of her right eyelids.  She denies vision changes.  She also complains of pain in the right aspect of her nose.  She has a history of seasonal rhinitis and recurrent sinusitis.  She denies fevers, chills, and sweats.  Her symptoms have not improved with Benadryl, Singulair, and Flonase.  The history is provided by the patient.    Past Medical History:  Diagnosis Date  . Allergy   . Anemia   . Anxiety   . Arthritis   . Asthma   . CML (chronic myelocytic leukemia) ([place]) 05/31/2018  . COPD (chronic obstructive pulmonary disease) ([place])   . Depression   . Diabetes mellitus without complication ([place])   . Dyspnea    with exertion and bronchititis  . Fibromyalgia   . GERD (gastroesophageal reflux disease)   . History of hiatal hernia   . History of kidney stones   . Hyperlipidemia   . Neuromuscular disorder ([place])   . Pneumonia    hs, 01-19-15  . Sleep apnea    has Cpap have not been using    Patient Active Problem List   Diagnosis Date Noted  . CML (chronic myelocytic leukemia) ([place]) 05/31/2018  . Perimenopause 07/13/2017  . Acute sinusitis 06/30/2017  . Allergic rhinitis 06/30/2017  . Carotid atherosclerosis, bilateral 05/18/2017  . Restrictive lung disease 11/15/2016  . Status migrainosus 10/01/2016  . IDA (iron deficiency anemia) 09/16/2016  . 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 Vitamin D deficiency 03/21/2016  . Chronic cough 12/24/2015  . Type 2 diabetes mellitus with hyperglycemia ([place]) 12/24/2015  . Pruritus 12/24/2015  . Urinary frequency 12/24/2015  . 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 Dependence on nicotine from  cigarettes 02/01/2014  . Vitamin B12 deficiency 02/01/2014  . Left ureteral stone 12/08/2013  . Lumbar disc herniation 10/07/2013  . COPD mixed type ([place]) 05/08/2013  . 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 IBS (irritable bowel syndrome) 03/28/2011  . History of tobacco use 02/04/2011  . Hypertension 12/23/2010  . OSA on CPAP 04/23/2009    Past Surgical History:  Procedure Laterality Date  . BACK SURGERY  2015,2016, 2018  . CARPAL TUNNEL RELEASE Right 2008  . CHOLECYSTECTOMY  1992  . KIDNEY STONE SURGERY  2015  . TONSILLECTOMY  1979  . TUBAL LIGATION  2005    OB History    Gravida  7   Para  3   Term      Preterm      AB  4   Living  3     SAB  4   TAB      Ectopic      Multiple      Live Births               Home Medications    Prior to Admission medications   Medication Sig Start Date End Date Taking? Authorizing Provider  albuterol (PROVENTIL HFA;VENTOLIN HFA) 108 (90 Base) MCG/ACT inhaler Inhale 1-2 puffs into the lungs every 4 (four) hours as needed for wheezing or shortness of breath. 12/24/15  Emeterio Reeve, DO  albuterol (PROVENTIL) (2.5 MG/3ML) 0.083% nebulizer solution Inhale the contents of one vial in nebulizer every four to six hours as needed for cough or wheeze. 11/09/16   Kozlow, Donnamarie Poag, MD  atorvastatin (LIPITOR) 80 MG tablet Take 1 tablet (80 mg total) by mouth daily. 01/13/18   Emeterio Reeve, DO  azelastine (ASTELIN) 0.1 % nasal spray Place 2 sprays into both nostrils 2 (two) times daily. Use in each nostril as directed 12/01/17   Juanito Doom, MD  benzonatate (TESSALON) 200 MG capsule Take 1 capsule (200 mg total) by mouth 3 (three) times daily as needed for cough. 03/14/18   Emeterio Reeve, DO  BIOTIN PO Take by mouth daily.    [provider]  budesonide-formoterol (SYMBICORT) 160-4.5 MCG/ACT inhaler Inhale 2 puffs into the lungs 2 (two) times daily. 11/01/17    Silverio Decamp, MD  cetirizine (ZYRTEC) 10 MG tablet Can take one tablet by mouth once or twice daily if needed. 07/11/17   Emeterio Reeve, DO  ciclopirox [facility]) 8 % solution Apply topically. 05/08/18 08/06/18  [provider]  clindamycin (CLEOCIN) 300 MG capsule Take 1 capsule (300 mg total) by mouth 3 (three) times daily for 10 days. 06/09/18 06/19/18  Kandra Nicolas, MD  clobetasol ointment (TEMOVATE) 8.31 % Apply 1 application topically 2 (two) times daily. 10/14/16   Silverio Decamp, MD  fluticasone (FLONASE) 50 MCG/ACT nasal spray Use two sprays in each nostril once daily as directed. 03/28/17   Collene Gobble, MD  folic acid (FOLVITE) 1 MG tablet Take 1 mg by mouth daily.    [provider]  Glucose Blood (BLOOD GLUCOSE TEST STRIPS) STRP 1 each by Other route 3 (three) times a day. Use as instructed 07/10/15   [provider]  glucose blood (CONTOUR NEXT TEST) test strip Used to check blood sugars tree times daily. 08/15/17   Renato Shin, MD  imatinib (GLEEVEC) 400 MG tablet Take 1 tablet (400 mg total) by mouth daily. Take with meals and large glass of water.Caution:Chemotherapy. 05/31/18   [name], MD  insulin aspart (NOVOLOG FLEXPEN) 100 UNIT/ML FlexPen Inject 40 Units into the skin daily with supper. Patient taking differently: Inject into the skin daily with supper. Sliding scale 10-40 units 07/08/17   Renato Shin, MD  Insulin Glargine (LANTUS SOLOSTAR) 100 UNIT/ML Solostar Pen Inject 140 Units into the skin daily. And pen needles 5/day Patient taking differently: Inject 100 Units into the skin daily. And pen needles 5/day 07/08/17   Renato Shin, MD  Insulin Pen Needle 32G X 4 MM MISC by Does not apply route. 02/27/14   [provider]  ipratropium (ATROVENT) 0.06 % nasal spray Place 2 sprays into both nostrils every 4 (four) hours as needed for rhinitis. 03/17/17   Gregor Hams, MD  meclizine (ANTIVERT) 25 MG tablet Take 1  tablet (25 mg total) by mouth 3 (three) times daily as needed for dizziness. 03/14/18   Emeterio Reeve, DO  meloxicam (MOBIC) 7.5 MG tablet Take 7.5 mg by mouth daily. 03/13/18   [provider]  metFORMIN (GLUCOPHAGE-XR) 500 MG 24 hr tablet Take by mouth. 05/30/18   [provider]  montelukast (SINGULAIR) 10 MG tablet Take 1 tablet (10 mg total) by mouth daily. 11/18/17   Emeterio Reeve, DO  nystatin (MYCOSTATIN/NYSTOP) powder Apply 1 g topically 2 (two) times daily as needed. 07/22/17   Emeterio Reeve, DO  omeprazole (PRILOSEC) 40 MG capsule Take 1  capsule (40 mg total) by mouth every morning. 07/11/17   Emeterio Reeve, DO  ondansetron (ZOFRAN-ODT) 8 MG disintegrating tablet Dissolve 1 tablet (8 mg total) by mouth every 8 (eight) hours as needed for nausea. 11/02/16   [provider]  pregabalin (LYRICA) 200 MG capsule TAKE 1 CAPSULE BY MOUTH 2 TIMES DAILY. 12/19/17   Emeterio Reeve, DO  tamsulosin (FLOMAX) 0.4 MG CAPS capsule Take 0.4 mg by mouth. 08/02/17   [provider]  tiotropium (SPIRIVA) 18 MCG inhalation capsule Place into inhaler and inhale. 04/17/18   [provider]  valACYclovir (VALTREX) 500 MG tablet Take 1 tablet (500 mg total) by mouth at bedtime. 07/11/17   Emeterio Reeve, DO  varenicline (CHANTIX STARTING MONTH PAK) 0.5 MG X 11 & 1 MG X 42 tablet Take one 0.5mg  tablet by mouth qd for 3 days, then increase to one 0.5mg  tablet bid for 3 days, then increase to one 1mg  tablet bid. Patient not taking: Reported on 05/31/2018 11/01/17   Silverio Decamp, MD    Family History Family History  Problem Relation Age of Onset  . Diabetes Father   . Hyperlipidemia Father   . Hypertension Father   . Diabetes Paternal Aunt   . Hypertension Paternal Aunt   . Heart attack Paternal Uncle   . Emphysema Maternal Grandmother   . Heart failure Maternal Grandmother   . Cancer Maternal Grandfather   . Colon cancer Neg Hx      Social History Social History   Tobacco Use  . Smoking status: Current Every Day Smoker    Packs/day: 1.00    Years: 35.00    Pack years: 35.00    Types: Cigarettes  . Smokeless tobacco: Never Used  . Tobacco comment: 05/17/2016 still smokes  Substance Use Topics  . Alcohol use: No  . Drug use: No     Allergies   Dulaglutide; Sulfur; Metformin; and Metformin and related   Review of Systems Review of Systems  Constitutional: Positive for chills and fatigue. Negative for activity change, appetite change, diaphoresis and fever.  HENT: Positive for congestion, facial swelling, rhinorrhea, sinus pain and sore throat. Negative for ear pain and trouble swallowing.   Eyes: Positive for discharge and redness. Negative for photophobia, pain, itching and visual disturbance.  Respiratory: Negative.   Cardiovascular: Positive for leg swelling.  Gastrointestinal: Negative.   Genitourinary: Negative.   Musculoskeletal: Negative.   Skin: Negative.   Neurological: Negative for headaches.  All other systems reviewed and are negative.    Physical Exam Triage Vital Signs ED Triage Vitals  Enc Vitals Group     BP 06/09/18 1608 104/71     Pulse Rate 06/09/18 1608 85     Resp --      Temp 06/09/18 1608 98.5 F (36.9 C)     Temp Source 06/09/18 1608 Oral     SpO2 06/09/18 1608 92 %     Weight 06/09/18 1610 262 lb (118.8 kg)     Height 06/09/18 1610 5\' 5"  (1.651 m)     Head Circumference --      Peak Flow --      Pain Score 06/09/18 1609 4     Pain Loc --      Pain Edu? --      Excl. in [place]? --    No data found.  Updated Vital Signs BP 104/71 (BP Location: Right Arm)   Pulse 85   Temp 98.5 F (36.9 C) (Oral)  Ht 5\' 5"  (1.651 m)   Wt 118.8 kg   LMP 11/17/2016 (Approximate)   SpO2 92%   BMI 43.60 kg/m   Visual Acuity Right Eye Distance: 20/40 Left Eye Distance: 20/40 Bilateral Distance: 20/30(with correction)  Right Eye Near:   Left Eye Near:    Bilateral Near:      Physical Exam Vitals signs and nursing note reviewed.  Constitutional:      General: She is not in acute distress.    Appearance: She is obese.  HENT:     Head: Normocephalic.      Comments: There is tenderness to palpation over the right maxillary sinus and right aspect of nose.  Turbinates are congested on the right.    Right Ear: Tympanic membrane normal.     Left Ear: Tympanic membrane normal.     Mouth/Throat:     Mouth: Mucous membranes are moist.     Pharynx: No posterior oropharyngeal erythema.  Eyes:     General:        Right eye: Discharge present.     Extraocular Movements: Extraocular movements intact.     Conjunctiva/sclera: Conjunctivae normal.     Pupils: Pupils are equal, round, and reactive to light.     Comments: There is mild swelling of the right eyelids with erythema and minimal tenderness to palpation.  Right conjunctivae minimally injected.  There is a small amount of purulent discharge from the right medial canthus.  Fundi are benign.  No photophobia.  Cardiovascular:     Heart sounds: Normal heart sounds.  Pulmonary:     Breath sounds: Normal breath sounds.  Abdominal:     Tenderness: There is no abdominal tenderness.  Musculoskeletal:     Right lower leg: No edema.     Left lower leg: No edema.  Lymphadenopathy:     Cervical: No cervical adenopathy.  Skin:    General: Skin is warm and dry.  Neurological:     Mental Status: She is alert.      UC Treatments / Results  Labs (all labs ordered are listed, but only abnormal results are displayed) Labs Reviewed  EYE CULTURE    EKG None  Radiology Dg Sinuses Complete  Result Date: 06/09/2018 CLINICAL DATA:  Right maxillary pain EXAM: PARANASAL SINUSES - COMPLETE 3 + VIEW COMPARISON:  None. FINDINGS: The paranasal sinus are aerated. There is no evidence of sinus opacification air-fluid levels or mucosal thickening. No significant bone abnormalities are seen. IMPRESSION: Negative. Electronically  Signed   By: Rolm Baptise M.D.   On: 06/09/2018 16:58    Procedures Procedures (including critical care time)  Medications Ordered in UC Medications - No data to display  Initial Impression / Assessment and Plan / UC Course  I have reviewed the triage vital signs and the nursing notes.  Pertinent labs & imaging results that were available during my care of the patient were reviewed by me and considered in my medical decision making (see chart for details).    Culture pending from right eye. Begin clindamycin. Followup with ophthalmologist if not improving about 3 to 4 days.  Final Clinical Impressions(s) / UC Diagnoses   Final diagnoses:  Nasal vestibulitis  Preseptal cellulitis of right eye     Discharge Instructions     Try applying warm compress to nose and right eye 3 to 4 times daily.  May take Tylenol as needed for pain.  If symptoms become significantly worse during the night or over the weekend,  proceed to the local emergency room.     ED Prescriptions    Medication Sig Dispense Auth. Provider   clindamycin (CLEOCIN) 300 MG capsule Take 1 capsule (300 mg total) by mouth 3 (three) times daily for 10 days. 30 capsule Kandra Nicolas, MD        Kandra Nicolas, MD 06/18/18 [phone]

## 2018-06-09 NOTE — Telephone Encounter (Signed)
Call received from patient stating that her dentists office does not have an emergency line and her dentists voicemail instructs pt.'s to go to the ED with any emergencies.  Patient instructed per order of S. [place] NP to go to the ED now.  Patient verbalizes an understanding to go to the ED now.

## 2018-06-09 NOTE — Discharge Instructions (Addendum)
Try applying warm compress to nose and right eye 3 to 4 times daily.  May take Tylenol as needed for pain.  If symptoms become significantly worse during the night or over the weekend, proceed to the local emergency room.

## 2018-06-09 NOTE — Telephone Encounter (Signed)
Call placed back to patient and patient notified per order of S. [place] NP to call her dentist for an emergent visit.  Pt verbalizes that she will call her dentist now.  Patient appreciative of call back.

## 2018-06-09 NOTE — ED Triage Notes (Signed)
RT eye swollen and painful, sore throat, sore in nose, chills x 3-4 days

## 2018-06-09 NOTE — Telephone Encounter (Signed)
Call received from patient stating that she has a "painful sore inside of her nose which is causing her right eye to be slightly swollen with a slight decrease in vision, swelling along her jaw line, a sore throat, chills at night with no fever for the past couple of days" Instructed pt that Dr. Marin Olp is out of the office today and that I would speak with [place] NP and call her back with any orders.

## 2018-06-12 ENCOUNTER — Ambulatory Visit: Payer: Medicaid Other | Admitting: Pulmonary Disease

## 2018-06-12 ENCOUNTER — Telehealth: Payer: Self-pay

## 2018-06-12 LAB — EYE CULTURE
MICRO NUMBER:: 388881
SPECIMEN QUALITY:: ADEQUATE

## 2018-06-12 NOTE — Telephone Encounter (Signed)
Left message on VM with lab results and to continue medication as prescribed.  Left contact information for any questions or concerns.

## 2018-06-15 ENCOUNTER — Inpatient Hospital Stay (HOSPITAL_BASED_OUTPATIENT_CLINIC_OR_DEPARTMENT_OTHER): Payer: Medicaid Other | Admitting: Hematology & Oncology

## 2018-06-15 ENCOUNTER — Inpatient Hospital Stay: Payer: Medicaid Other

## 2018-06-15 ENCOUNTER — Other Ambulatory Visit: Payer: Self-pay

## 2018-06-15 ENCOUNTER — Telehealth: Payer: Self-pay | Admitting: *Deleted

## 2018-06-15 ENCOUNTER — Encounter: Payer: Self-pay | Admitting: Hematology & Oncology

## 2018-06-15 VITALS — BP 135/82 | HR 76 | Temp 98.1°F | Resp 19 | Wt 270.0 lb

## 2018-06-15 DIAGNOSIS — H1131 Conjunctival hemorrhage, right eye: Secondary | ICD-10-CM | POA: Diagnosis not present

## 2018-06-15 DIAGNOSIS — E1140 Type 2 diabetes mellitus with diabetic neuropathy, unspecified: Secondary | ICD-10-CM

## 2018-06-15 DIAGNOSIS — Z79899 Other long term (current) drug therapy: Secondary | ICD-10-CM

## 2018-06-15 DIAGNOSIS — E1165 Type 2 diabetes mellitus with hyperglycemia: Secondary | ICD-10-CM

## 2018-06-15 DIAGNOSIS — M7989 Other specified soft tissue disorders: Secondary | ICD-10-CM

## 2018-06-15 DIAGNOSIS — D509 Iron deficiency anemia, unspecified: Secondary | ICD-10-CM

## 2018-06-15 DIAGNOSIS — H05223 Edema of bilateral orbit: Secondary | ICD-10-CM | POA: Diagnosis not present

## 2018-06-15 DIAGNOSIS — C9210 Chronic myeloid leukemia, BCR/ABL-positive, not having achieved remission: Secondary | ICD-10-CM

## 2018-06-15 DIAGNOSIS — J449 Chronic obstructive pulmonary disease, unspecified: Secondary | ICD-10-CM

## 2018-06-15 DIAGNOSIS — H538 Other visual disturbances: Secondary | ICD-10-CM

## 2018-06-15 DIAGNOSIS — Z794 Long term (current) use of insulin: Secondary | ICD-10-CM

## 2018-06-15 LAB — CBC WITH DIFFERENTIAL (CANCER CENTER ONLY)
Abs Immature Granulocytes: 2.18 10*3/uL — ABNORMAL HIGH (ref 0.00–0.07)
Basophils Absolute: 1.2 10*3/uL — ABNORMAL HIGH (ref 0.0–0.1)
Basophils Relative: 5 %
Eosinophils Absolute: 0.7 10*3/uL — ABNORMAL HIGH (ref 0.0–0.5)
Eosinophils Relative: 3 %
HCT: 37.7 % (ref 36.0–46.0)
Hemoglobin: 11.5 g/dL — ABNORMAL LOW (ref 12.0–15.0)
Immature Granulocytes: 9 %
Lymphocytes Relative: 13 %
Lymphs Abs: 3.2 10*3/uL (ref 0.7–4.0)
MCH: 29.6 pg (ref 26.0–34.0)
MCHC: 30.5 g/dL (ref 30.0–36.0)
MCV: 96.9 fL (ref 80.0–100.0)
Monocytes Absolute: 0.5 10*3/uL (ref 0.1–1.0)
Monocytes Relative: 2 %
Neutro Abs: 17.6 10*3/uL — ABNORMAL HIGH (ref 1.7–7.7)
Neutrophils Relative %: 68 %
Platelet Count: 285 10*3/uL (ref 150–400)
RBC: 3.89 MIL/uL (ref 3.87–5.11)
RDW: 18.9 % — ABNORMAL HIGH (ref 11.5–15.5)
WBC Count: 25.4 10*3/uL — ABNORMAL HIGH (ref 4.0–10.5)
nRBC: 0.8 % — ABNORMAL HIGH (ref 0.0–0.2)

## 2018-06-15 LAB — CMP (CANCER CENTER ONLY)
ALT: 10 U/L (ref 0–44)
AST: 11 U/L — ABNORMAL LOW (ref 15–41)
Albumin: 3.7 g/dL (ref 3.5–5.0)
Alkaline Phosphatase: 91 U/L (ref 38–126)
Anion gap: 6 (ref 5–15)
BUN: 18 mg/dL (ref 6–20)
CO2: 33 mmol/L — ABNORMAL HIGH (ref 22–32)
Calcium: 9.5 mg/dL (ref 8.9–10.3)
Chloride: 101 mmol/L (ref 98–111)
Creatinine: 0.94 mg/dL (ref 0.44–1.00)
GFR, Est AFR Am: >60 mL/min (ref >60)
GFR, Estimated: >60 mL/min (ref >60)
Glucose, Bld: 130 mg/dL — ABNORMAL HIGH (ref 70–99)
Potassium: 4.1 mmol/L (ref 3.5–5.1)
Sodium: 140 mmol/L (ref 135–145)
Total Bilirubin: 0.6 mg/dL (ref 0.3–1.2)
Total Protein: 6 g/dL — ABNORMAL LOW (ref 6.5–8.1)

## 2018-06-15 MED ORDER — FUROSEMIDE 40 MG PO TABS: ORAL_TABLET | ORAL | 2 refills | Status: DC

## 2018-06-15 NOTE — Telephone Encounter (Signed)
Call received from patient stating that she was diagnosed with a bacterial infection in her eye last week at urgent care and was told to contact her pcp early this week if infection had not cleared.  Pt states that infection was not better and that she has seen her PCP and eye specialist at [facility] and would like to know if Tabitha Estrada thinks eye issues are related to Tabitha Estrada.  Order received by Tabitha Estrada for patient to come in today for labs and to see Tabitha Estrada.  Message sent to scheduling.

## 2018-06-15 NOTE — Progress Notes (Signed)
Hematology and Oncology Follow Up Visit  Tabitha Estrada [id number] [birth year] 48 y.o. 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 Tabitha Estrada is here today for an unscheduled visit.  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  We have proven this with her BCR/ABL analysis.  Her BCR ABL ratio 88%.  We started her on Gleevec.  She has been on [place] for a couple weeks.  Bout a week ago, she began to have swelling in her face.  She had mostly periorbital edema.  She then began to have some bruising about the right orbit.  She saw her family doctor.  She was then referred to an ophthalmologist.  He did a thorough examination on her.  She was then sent to our office.  She also has swelling in her hands and legs.  Think this is all from the [place].  Periorbital edema is a well-known side effect of Gleevec therapy.  She is doing well otherwise.  Her white cell count is come down by over half already.  I looked at her blood smear.  I did not see very many immature myeloid cells.  Most of her white cells were mature polys.  She has had no fever.  She has had no nausea or vomiting.  She has had no diarrhea.  There is been no rashes.  Overall, her performance status is ECOG 1.   Medications:  Allergies as of 06/15/2018      Reactions   Dulaglutide Other (See Comments), Nausea And Vomiting   Severe stomach pain Severe stomach pain   Sulfur Nausea And Vomiting   SULFA   Metformin Diarrhea   Metformin And Related Diarrhea      Medication List       Accurate as of June 15, 2018  4:54 PM. Always use your most recent med list.        albuterol 108 (90 Base) MCG/ACT inhaler Commonly known as:  VENTOLIN HFA Inhale 1-2 puffs into the lungs every 4 (four) hours as needed for wheezing or shortness of breath.    albuterol (2.5 MG/3ML) 0.083% nebulizer solution Commonly known as:  PROVENTIL Inhale the contents of one vial in nebulizer every four to six hours as needed for cough or wheeze.   aluminum-magnesium hydroxide-simethicone 790-240-97 MG/5ML Susp Commonly known as:  MAALOX Take by mouth.   atorvastatin 80 MG tablet Commonly known as:  LIPITOR Take 1 tablet (80 mg total) by mouth daily.   azelastine 0.1 % nasal spray Commonly known as:  ASTELIN Place 2 sprays into both nostrils 2 (two) times daily. Use in each nostril as directed   benzonatate 200 MG capsule Commonly known as:  TESSALON Take 1 capsule (200 mg total) by mouth 3 (three) times daily as needed for cough.   bifidobacterium infantis capsule Take by mouth.   BIOTIN PO Take by mouth daily.   BLOOD GLUCOSE TEST STRIPS Strp 1 each by Other route 3 (three) times a day. Use as instructed   glucose blood test strip Commonly known as:  Contour Next Test Used to check blood sugars tree times daily.   budesonide-formoterol 160-4.5 MCG/ACT inhaler Commonly known as:  SYMBICORT Inhale 2 puffs into the lungs 2 (two) times daily.   cetirizine 10 MG tablet Commonly known as:  ZYRTEC Can take one tablet by mouth once or twice daily if needed.   ciclopirox 8 % solution Commonly known as:  PENLAC Apply topically.   clindamycin 300 MG capsule Commonly known as:  CLEOCIN Take 1 capsule (300 mg total) by mouth 3 (three) times daily for 10 days.   clobetasol ointment 0.05 % Commonly known as:  TEMOVATE Apply 1 application topically 2 (two) times daily.   erythromycin ophthalmic ointment Place into both eyes every 6 (six) hours for 7 days.   famotidine 40 MG/5ML suspension Commonly known as:  PEPCID Take by mouth.   Flomax 0.4 MG Caps capsule Generic drug:  tamsulosin Take 0.4 mg by mouth.   fluticasone 50 MCG/ACT nasal spray Commonly known as:  FLONASE Use two sprays in each nostril once daily as directed.    folic acid 1 MG tablet Commonly known as:  FOLVITE Take 1 mg by mouth daily.   furosemide 40 MG tablet Commonly known as:  LASIX Take 1 pill daily for 3 days for facial swelling.  Then take daily as needed.   imatinib 400 MG tablet Commonly known as:  GLEEVEC Take 1 tablet (400 mg total) by mouth daily. Take with meals and large glass of water.Caution:Chemotherapy.   insulin aspart 100 UNIT/ML FlexPen Commonly known as:  NovoLOG FlexPen Inject 40 Units into the skin daily with supper.   Insulin Glargine 100 UNIT/ML Solostar Pen Commonly known as:  Lantus SoloStar Inject 140 Units into the skin daily. And pen needles 5/day   Insulin Pen Needle 32G X 4 MM Misc by Does not apply route.   ipratropium 0.06 % nasal spray Commonly known as:  Atrovent Place 2 sprays into both nostrils every 4 (four) hours as needed for rhinitis.   meclizine 25 MG tablet Commonly known as:  ANTIVERT Take 1 tablet (25 mg total) by mouth 3 (three) times daily as needed for dizziness.   meloxicam 7.5 MG tablet Commonly known as:  MOBIC Take 7.5 mg by mouth daily.   metFORMIN 500 MG 24 hr tablet Commonly known as:  GLUCOPHAGE-XR Take by mouth.   montelukast 10 MG tablet Commonly known as:  SINGULAIR Take 1 tablet (10 mg total) by mouth daily.   nystatin powder Commonly known as:  MYCOSTATIN/NYSTOP Apply 1 g topically 2 (two) times daily as needed.   omeprazole 40 MG capsule Commonly known as:  PRILOSEC Take 1 capsule (40 mg total) by mouth every morning.   ondansetron 8 MG disintegrating tablet Commonly known as:  ZOFRAN-ODT Dissolve 1 tablet (8 mg total) by mouth every 8 (eight) hours as needed for nausea.   pregabalin 200 MG capsule Commonly known as:  LYRICA TAKE 1 CAPSULE BY MOUTH 2 TIMES DAILY.   tiotropium 18 MCG inhalation capsule Commonly known as:  [place] into inhaler and inhale.   valACYclovir 500 MG tablet Commonly known as:  VALTREX Take 1 tablet (500 mg total)  by mouth at bedtime.   varenicline 0.5 MG X 11 & 1 MG X 42 tablet Commonly known as:  Chantix Starting Month Pak Take one 0.5mg  tablet by mouth qd for 3 days, then increase to one 0.5mg  tablet bid for 3 days, then increase to one 1mg  tablet bid.       Allergies:  Allergies  Allergen Reactions  . Dulaglutide Other (See Comments) and Nausea And Vomiting    Severe stomach pain Severe stomach pain  . Sulfur Nausea And Vomiting    SULFA  . Metformin Diarrhea  .  Metformin And Related Diarrhea    Past Medical History, Surgical history, Social history, and Family History were reviewed and updated.  Review of Systems: Review of Systems  Constitutional: Negative.   HENT: Negative.   Eyes: Positive for blurred vision, discharge and redness.  Respiratory: Negative.   Cardiovascular: Negative.   Gastrointestinal: Negative.   Genitourinary: Negative.   Musculoskeletal: Negative.   Skin: Negative.   Neurological: Negative.   Endo/Heme/Allergies: Negative.   Psychiatric/Behavioral: Negative.      Physical Exam:  weight is 270 lb (122.5 kg). Her oral temperature is 98.1 F (36.7 C). Her blood pressure is 135/82 and her pulse is 76. Her respiration is 19 and oxygen saturation is 97%.   Wt Readings from Last 3 Encounters:  06/15/18 270 lb (122.5 kg)  06/09/18 262 lb (118.8 kg)  05/31/18 262 lb 6.4 oz (119 kg)    Physical Exam Vitals signs reviewed.  HENT:     Head: Normocephalic and atraumatic.     Comments: She has some facial swelling bilaterally.  She has no intraoral swelling.  Her tongue is not edematous.  She has no adenopathy in the neck. Eyes:     Pupils: Pupils are equal, round, and reactive to light.     Comments: She has periorbital edema both eyes.  She has ecchymoses around the right eye.  She has conjunctival hemorrhage with the right eye.  She does have some decreased range of motion of her eyes.  She has no tenderness over the face.  Neck:     Musculoskeletal:  Normal range of motion.  Cardiovascular:     Rate and Rhythm: Normal rate and regular rhythm.     Heart sounds: Normal heart sounds.  Pulmonary:     Effort: Pulmonary effort is normal.     Breath sounds: Normal breath sounds.  Abdominal:     General: Bowel sounds are normal.     Palpations: Abdomen is soft.  Musculoskeletal: Normal range of motion.        General: No tenderness or deformity.     Comments: She has 1+ edema in her lower legs bilaterally.  She has mild edema in her hands bilaterally.  Lymphadenopathy:     Cervical: No cervical adenopathy.  Skin:    General: Skin is warm and dry.     Findings: No erythema or rash.  Neurological:     Mental Status: She is alert and oriented to person, place, and time.  Psychiatric:        Behavior: Behavior normal.        Thought Content: Thought content normal.        Judgment: Judgment normal.      Lab Results  Component Value Date   WBC 25.4 (H) 06/15/2018   HGB 11.5 (L) 06/15/2018   HCT 37.7 06/15/2018   MCV 96.9 06/15/2018   PLT 285 06/15/2018   Lab Results  Component Value Date   FERRITIN 411 (H) 05/31/2018   IRON 44 05/31/2018   TIBC 223 (L) 05/31/2018   UIBC 178 05/31/2018   IRONPCTSAT 20 (L) 05/31/2018   Lab Results  Component Value Date   RBC 3.89 06/15/2018   No results found for: KPAFRELGTCHN, LAMBDASER, KAPLAMBRATIO Lab Results  Component Value Date   IGGSERUM 675 (L) 11/10/2016   IGMSERUM 86 11/10/2016   No results found for: TOTALPROTELP, ALBUMINELP, A1GS, A2GS, BETS, BETA2SER, GAMS, MSPIKE, SPEI   Chemistry      Component Value Date/Time   NA 140  06/15/2018 1504   NA 144 01/27/2017 1302   NA 138 09/16/2016 1105   K 4.1 06/15/2018 1504   K 4.3 01/27/2017 1302   K 4.2 09/16/2016 1105   CL 101 06/15/2018 1504   CL 102 01/27/2017 1302   CO2 33 (H) 06/15/2018 1504   CO2 27 01/27/2017 1302   CO2 26 09/16/2016 1105   BUN 18 06/15/2018 1504   BUN 14 01/27/2017 1302   BUN 11.9 09/16/2016 1105    CREATININE 0.94 06/15/2018 1504   CREATININE 1.1 01/27/2017 1302   CREATININE 0.8 09/16/2016 1105      Component Value Date/Time   CALCIUM 9.5 06/15/2018 1504   CALCIUM 9.5 01/27/2017 1302   CALCIUM 9.0 09/16/2016 1105   ALKPHOS 91 06/15/2018 1504   ALKPHOS 116 (H) 01/27/2017 1302   ALKPHOS 136 09/16/2016 1105   AST 11 (L) 06/15/2018 1504   AST 8 09/16/2016 1105   ALT 10 06/15/2018 1504   ALT 25 01/27/2017 1302   ALT 12 09/16/2016 1105   BILITOT 0.6 06/15/2018 1504   BILITOT 0.32 09/16/2016 1105       Impression and Plan: Ms. Vicencio is a very pleasant 48 yo with chronic phase CML.  She is on Gleevec.  Looks like she now has some toxicity from [employer].  I think that we does need to stop the [place] on her.  I think her white cell count will still come down.  I gave her some Lasix to take.  I told her to take 40 mg Lasix daily for 3 days and then take Lasix as needed for swelling.  I think this may help more so with her hands and legs.  It may also help a little bit with her face.  I do not want to give her any steroids because of her diabetes.  She does see the ophthalmologist again next week.  She has appointment to see [place] back on 24 April.  We will keep that appointment.  This was somewhat complicated.  I spent about 35 minutes with her today.  All time spent face-to-face with her.  I helped counsel her about the Gleevec side effects.  We may have to try another TK I agent for her CML once she improves with her edema.     [name], MD 4/16/20204:54 PM

## 2018-06-16 LAB — LACTATE DEHYDROGENASE: LDH: 456 U/L — ABNORMAL HIGH (ref 98–192)

## 2018-06-19 ENCOUNTER — Other Ambulatory Visit: Payer: Self-pay | Admitting: Osteopathic Medicine

## 2018-06-19 NOTE — Telephone Encounter (Signed)
Please review for refill- patient at [facility] 

## 2018-06-21 ENCOUNTER — Telehealth: Payer: Self-pay | Admitting: Hematology & Oncology

## 2018-06-21 NOTE — Telephone Encounter (Signed)
Called and LMVM for patient regarding NO MEDICAID COVERAGE at this time for ECHO and she would be self pay. Advised her to contact Medicaid if this was incorrect. Per 4/22 staff msg from Otilio Carpen

## 2018-06-22 ENCOUNTER — Other Ambulatory Visit: Payer: Self-pay | Admitting: *Deleted

## 2018-06-22 DIAGNOSIS — C9210 Chronic myeloid leukemia, BCR/ABL-positive, not having achieved remission: Secondary | ICD-10-CM

## 2018-06-23 ENCOUNTER — Other Ambulatory Visit: Payer: Self-pay

## 2018-06-23 ENCOUNTER — Inpatient Hospital Stay: Payer: Medicaid Other

## 2018-06-23 ENCOUNTER — Inpatient Hospital Stay (HOSPITAL_BASED_OUTPATIENT_CLINIC_OR_DEPARTMENT_OTHER): Payer: Medicaid Other | Admitting: Hematology & Oncology

## 2018-06-23 ENCOUNTER — Telehealth: Payer: Self-pay | Admitting: Hematology & Oncology

## 2018-06-23 ENCOUNTER — Encounter: Payer: Self-pay | Admitting: Hematology & Oncology

## 2018-06-23 VITALS — BP 117/63 | HR 76 | Temp 97.9°F | Resp 18 | Wt 258.0 lb

## 2018-06-23 DIAGNOSIS — C9210 Chronic myeloid leukemia, BCR/ABL-positive, not having achieved remission: Secondary | ICD-10-CM

## 2018-06-23 DIAGNOSIS — D509 Iron deficiency anemia, unspecified: Secondary | ICD-10-CM

## 2018-06-23 DIAGNOSIS — H538 Other visual disturbances: Secondary | ICD-10-CM

## 2018-06-23 DIAGNOSIS — E1140 Type 2 diabetes mellitus with diabetic neuropathy, unspecified: Secondary | ICD-10-CM

## 2018-06-23 DIAGNOSIS — H05223 Edema of bilateral orbit: Secondary | ICD-10-CM

## 2018-06-23 DIAGNOSIS — R109 Unspecified abdominal pain: Secondary | ICD-10-CM | POA: Diagnosis not present

## 2018-06-23 DIAGNOSIS — H1131 Conjunctival hemorrhage, right eye: Secondary | ICD-10-CM

## 2018-06-23 DIAGNOSIS — J449 Chronic obstructive pulmonary disease, unspecified: Secondary | ICD-10-CM

## 2018-06-23 DIAGNOSIS — R609 Edema, unspecified: Secondary | ICD-10-CM

## 2018-06-23 DIAGNOSIS — R61 Generalized hyperhidrosis: Secondary | ICD-10-CM | POA: Diagnosis not present

## 2018-06-23 DIAGNOSIS — E1165 Type 2 diabetes mellitus with hyperglycemia: Secondary | ICD-10-CM

## 2018-06-23 DIAGNOSIS — Z794 Long term (current) use of insulin: Secondary | ICD-10-CM

## 2018-06-23 DIAGNOSIS — M7989 Other specified soft tissue disorders: Secondary | ICD-10-CM

## 2018-06-23 DIAGNOSIS — Z79899 Other long term (current) drug therapy: Secondary | ICD-10-CM

## 2018-06-23 LAB — CMP (CANCER CENTER ONLY)
ALT: 11 U/L (ref 0–44)
AST: 9 U/L — ABNORMAL LOW (ref 15–41)
Albumin: 3.9 g/dL (ref 3.5–5.0)
Alkaline Phosphatase: 91 U/L (ref 38–126)
Anion gap: 8 (ref 5–15)
BUN: 17 mg/dL (ref 6–20)
CO2: 31 mmol/L (ref 22–32)
Calcium: 9.5 mg/dL (ref 8.9–10.3)
Chloride: 103 mmol/L (ref 98–111)
Creatinine: 0.92 mg/dL (ref 0.44–1.00)
GFR, Est AFR Am: >60 mL/min (ref >60)
GFR, Estimated: >60 mL/min (ref >60)
Glucose, Bld: 104 mg/dL — ABNORMAL HIGH (ref 70–99)
Potassium: 3.9 mmol/L (ref 3.5–5.1)
Sodium: 142 mmol/L (ref 135–145)
Total Bilirubin: 0.6 mg/dL (ref 0.3–1.2)
Total Protein: 6.2 g/dL — ABNORMAL LOW (ref 6.5–8.1)

## 2018-06-23 LAB — CBC WITH DIFFERENTIAL (CANCER CENTER ONLY)
Abs Immature Granulocytes: 0.3 10*3/uL — ABNORMAL HIGH (ref 0.00–0.07)
Basophils Absolute: 0.5 10*3/uL — ABNORMAL HIGH (ref 0.0–0.1)
Basophils Relative: 4 %
Eosinophils Absolute: 0.4 10*3/uL (ref 0.0–0.5)
Eosinophils Relative: 4 %
HCT: 38.4 % (ref 36.0–46.0)
Hemoglobin: 12 g/dL (ref 12.0–15.0)
Immature Granulocytes: 3 %
Lymphocytes Relative: 17 %
Lymphs Abs: 1.9 10*3/uL (ref 0.7–4.0)
MCH: 30.2 pg (ref 26.0–34.0)
MCHC: 31.3 g/dL (ref 30.0–36.0)
MCV: 96.5 fL (ref 80.0–100.0)
Monocytes Absolute: 0.4 10*3/uL (ref 0.1–1.0)
Monocytes Relative: 4 %
Neutro Abs: 7.6 10*3/uL (ref 1.7–7.7)
Neutrophils Relative %: 68 %
Platelet Count: 276 10*3/uL (ref 150–400)
RBC: 3.98 MIL/uL (ref 3.87–5.11)
RDW: 19 % — ABNORMAL HIGH (ref 11.5–15.5)
WBC Count: 11.1 10*3/uL — ABNORMAL HIGH (ref 4.0–10.5)
nRBC: 1.5 % — ABNORMAL HIGH (ref 0.0–0.2)

## 2018-06-23 NOTE — Progress Notes (Signed)
Hematology and Oncology Follow Up Visit  Tabitha Estrada [id number] May 03, 1970 48 y.o. 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  She is looking a whole lot better.  Her periorbital edema has come down quite nicely.  The swelling in her legs have also improved.  She had a lot of fluid retention with the Gleevec.  This is an unusual complication from [facility] therapy.  Her white cell count is come down quite nicely.  It is now down to 11,000.  She has been on diuretics.  She took these for about 4 days in a row and lost about 12 pounds of fluid.  She does feel a little bit better.  I think it is clear that we probably will have to switch her TK I agent.  I probably would consider her for Sprycel.  Medications:  Allergies as of 06/23/2018      Reactions   Dulaglutide Other (See Comments), Nausea And Vomiting   Severe stomach pain Severe stomach pain   Sulfur Nausea And Vomiting   SULFA   Metformin Diarrhea   Metformin And Related Diarrhea      Medication List       Accurate as of June 23, 2018  3:13 PM. Always use your most recent med list.        albuterol 108 (90 Base) MCG/ACT inhaler Commonly known as:  VENTOLIN HFA Inhale 1-2 puffs into the lungs every 4 (four) hours as needed for wheezing or shortness of breath.   albuterol (2.5 MG/3ML) 0.083% nebulizer solution Commonly known as:  PROVENTIL Inhale the contents of one vial in nebulizer every four to six hours as needed for cough or wheeze.   aluminum-magnesium hydroxide-simethicone 229-798-92 MG/5ML Susp Commonly known as:  MAALOX Take by mouth.   atorvastatin 80 MG tablet Commonly known as:  LIPITOR Take 1 tablet (80 mg total) by mouth daily.   BIOTIN PO Take by mouth daily.   BLOOD GLUCOSE  TEST STRIPS Strp 1 each by Other route 3 (three) times a day. Use as instructed   glucose blood test strip Commonly known as:  Contour Next Test Used to check blood sugars tree times daily.   budesonide-formoterol 160-4.5 MCG/ACT inhaler Commonly known as:  SYMBICORT Inhale 2 puffs into the lungs 2 (two) times daily.   cetirizine 10 MG tablet Commonly known as:  ZYRTEC Can take one tablet by mouth once or twice daily if needed.   ciclopirox 8 % solution Commonly known as:  PENLAC Apply topically.   clobetasol ointment 0.05 % Commonly known as:  TEMOVATE Apply 1 application topically 2 (two) times daily.   famotidine 40 MG/5ML suspension Commonly known as:  PEPCID Take by mouth.   Flomax 0.4 MG Caps capsule Generic drug:  tamsulosin Take 0.4 mg by mouth.   fluticasone 50 MCG/ACT nasal spray Commonly known as:  FLONASE Use two sprays in each nostril once daily as directed.   furosemide 40 MG tablet Commonly known as:  LASIX Take 1 pill daily for 3 days for facial swelling.  Then take daily as needed.   insulin aspart 100 UNIT/ML FlexPen Commonly known as:  NovoLOG FlexPen Inject 40 Units into the skin daily with supper.   Insulin Glargine 100 UNIT/ML Solostar Pen  Commonly known as:  Lantus SoloStar Inject 140 Units into the skin daily. And pen needles 5/day   meclizine 25 MG tablet Commonly known as:  ANTIVERT Take 1 tablet (25 mg total) by mouth 3 (three) times daily as needed for dizziness.   meloxicam 7.5 MG tablet Commonly known as:  MOBIC Take 7.5 mg by mouth daily.   metFORMIN 500 MG 24 hr tablet Commonly known as:  GLUCOPHAGE-XR Take by mouth.   montelukast 10 MG tablet Commonly known as:  SINGULAIR Take 1 tablet (10 mg total) by mouth daily.   nystatin powder Commonly known as:  MYCOSTATIN/NYSTOP Apply 1 g topically 2 (two) times daily as needed.   omeprazole 40 MG capsule Commonly known as:  PRILOSEC Take 1 capsule (40 mg total) by mouth  every morning.   ondansetron 8 MG disintegrating tablet Commonly known as:  ZOFRAN-ODT Dissolve 1 tablet (8 mg total) by mouth every 8 (eight) hours as needed for nausea.   pregabalin 200 MG capsule Commonly known as:  LYRICA TAKE 1 CAPSULE BY MOUTH 2 TIMES DAILY.   tiotropium 18 MCG inhalation capsule Commonly known as:  [place] into inhaler and inhale.   valACYclovir 500 MG tablet Commonly known as:  VALTREX Take 1 tablet (500 mg total) by mouth at bedtime.   varenicline 0.5 MG X 11 & 1 MG X 42 tablet Commonly known as:  Chantix Starting Month Pak Take one 0.5mg  tablet by mouth qd for 3 days, then increase to one 0.5mg  tablet bid for 3 days, then increase to one 1mg  tablet bid.       Allergies:  Allergies  Allergen Reactions  . Dulaglutide Other (See Comments) and Nausea And Vomiting    Severe stomach pain Severe stomach pain  . Sulfur Nausea And Vomiting    SULFA  . Metformin Diarrhea  . Metformin And Related Diarrhea    Past Medical History, Surgical history, Social history, and Family History were reviewed and updated.  Review of Systems: Review of Systems  Constitutional: Negative.   HENT: Negative.   Eyes: Positive for blurred vision, discharge and redness.  Respiratory: Negative.   Cardiovascular: Negative.   Gastrointestinal: Negative.   Genitourinary: Negative.   Musculoskeletal: Negative.   Skin: Negative.   Neurological: Negative.   Endo/Heme/Allergies: Negative.   Psychiatric/Behavioral: Negative.      Physical Exam:  weight is 258 lb (117 kg). Her oral temperature is 97.9 F (36.6 C). Her blood pressure is 117/63 and her pulse is 76. Her respiration is 18 and oxygen saturation is 98%.   Wt Readings from Last 3 Encounters:  06/23/18 258 lb (117 kg)  06/15/18 270 lb (122.5 kg)  06/09/18 262 lb (118.8 kg)    Physical Exam Vitals signs reviewed.  HENT:     Head: Normocephalic and atraumatic.     Comments: She has some facial  swelling bilaterally.  She has no intraoral swelling.  Her tongue is not edematous.  She has no adenopathy in the neck. Eyes:     Pupils: Pupils are equal, round, and reactive to light.     Comments: She has periorbital edema both eyes.  She has ecchymoses around the right eye.  She has conjunctival hemorrhage with the right eye.  She does have some decreased range of motion of her eyes.  She has no tenderness over the face.  Neck:     Musculoskeletal: Normal range of motion.  Cardiovascular:     Rate and Rhythm: Normal rate and  regular rhythm.     Heart sounds: Normal heart sounds.  Pulmonary:     Effort: Pulmonary effort is normal.     Breath sounds: Normal breath sounds.  Abdominal:     General: Bowel sounds are normal.     Palpations: Abdomen is soft.  Musculoskeletal: Normal range of motion.        General: No tenderness or deformity.     Comments: She has 1+ edema in her lower legs bilaterally.  She has mild edema in her hands bilaterally.  Lymphadenopathy:     Cervical: No cervical adenopathy.  Skin:    General: Skin is warm and dry.     Findings: No erythema or rash.  Neurological:     Mental Status: She is alert and oriented to person, place, and time.  Psychiatric:        Behavior: Behavior normal.        Thought Content: Thought content normal.        Judgment: Judgment normal.      Lab Results  Component Value Date   WBC 11.1 (H) 06/23/2018   HGB 12.0 06/23/2018   HCT 38.4 06/23/2018   MCV 96.5 06/23/2018   PLT 276 06/23/2018   Lab Results  Component Value Date   FERRITIN 411 (H) 05/31/2018   IRON 44 05/31/2018   TIBC 223 (L) 05/31/2018   UIBC 178 05/31/2018   IRONPCTSAT 20 (L) 05/31/2018   Lab Results  Component Value Date   RBC 3.98 06/23/2018   No results found for: Nils Pyle, [facility] Lab Results  Component Value Date   IGGSERUM 675 (L) 11/10/2016   IGMSERUM 86 11/10/2016   No results found for: Odetta Pink, SPEI   Chemistry      Component Value Date/Time   NA 142 06/23/2018 1347   NA 144 01/27/2017 1302   NA 138 09/16/2016 1105   K 3.9 06/23/2018 1347   K 4.3 01/27/2017 1302   K 4.2 09/16/2016 1105   CL 103 06/23/2018 1347   CL 102 01/27/2017 1302   CO2 31 06/23/2018 1347   CO2 27 01/27/2017 1302   CO2 26 09/16/2016 1105   BUN 17 06/23/2018 1347   BUN 14 01/27/2017 1302   BUN 11.9 09/16/2016 1105   CREATININE 0.92 06/23/2018 1347   CREATININE 1.1 01/27/2017 1302   CREATININE 0.8 09/16/2016 1105      Component Value Date/Time   CALCIUM 9.5 06/23/2018 1347   CALCIUM 9.5 01/27/2017 1302   CALCIUM 9.0 09/16/2016 1105   ALKPHOS 91 06/23/2018 1347   ALKPHOS 116 (H) 01/27/2017 1302   ALKPHOS 136 09/16/2016 1105   AST 9 (L) 06/23/2018 1347   AST 8 09/16/2016 1105   ALT 11 06/23/2018 1347   ALT 25 01/27/2017 1302   ALT 12 09/16/2016 1105   BILITOT 0.6 06/23/2018 1347   BILITOT 0.32 09/16/2016 1105       Impression and Plan: Ms. Vantassell is a very pleasant 48 yo with chronic phase CML.  Again, she is improving nicely.  Because of this, I think that we can now have her come back in 3 weeks.  I still think it would be safe her to be off [place] for 3 weeks.  When we see her back, I will consider her for Sprycel therapy.  [name], MD 4/24/20203:13 PM

## 2018-06-23 NOTE — Telephone Encounter (Signed)
lmom to inform pt of 5/15 appt at 2 pm per 4/24 los

## 2018-06-26 ENCOUNTER — Other Ambulatory Visit: Payer: Self-pay | Admitting: Osteopathic Medicine

## 2018-06-26 DIAGNOSIS — K219 Gastro-esophageal reflux disease without esophagitis: Secondary | ICD-10-CM

## 2018-06-30 ENCOUNTER — Telehealth: Payer: Self-pay | Admitting: *Deleted

## 2018-06-30 NOTE — Telephone Encounter (Signed)
Message received from patient wanting to know when appt for echocardiogram is.  Message sent to scheduling.

## 2018-07-05 ENCOUNTER — Ambulatory Visit (HOSPITAL_BASED_OUTPATIENT_CLINIC_OR_DEPARTMENT_OTHER): Payer: Medicaid Other

## 2018-07-05 ENCOUNTER — Telehealth: Payer: Self-pay | Admitting: Family

## 2018-07-05 NOTE — Telephone Encounter (Signed)
LVM for patient regarding ECHO appt being cancelled due to no Auth from [facility] per Baxter Flattery.  Also 5/15 appt cancelled and r/s to 5/14 due to provider out of office PAL

## 2018-07-13 ENCOUNTER — Telehealth: Payer: Self-pay | Admitting: *Deleted

## 2018-07-13 ENCOUNTER — Inpatient Hospital Stay (HOSPITAL_BASED_OUTPATIENT_CLINIC_OR_DEPARTMENT_OTHER): Payer: Medicaid Other | Admitting: Family

## 2018-07-13 ENCOUNTER — Other Ambulatory Visit: Payer: Self-pay

## 2018-07-13 ENCOUNTER — Inpatient Hospital Stay: Payer: Medicaid Other | Attending: Family

## 2018-07-13 VITALS — BP 142/66 | HR 72 | Resp 18 | Ht 65.0 in | Wt 255.8 lb

## 2018-07-13 DIAGNOSIS — Z79899 Other long term (current) drug therapy: Secondary | ICD-10-CM

## 2018-07-13 DIAGNOSIS — Z794 Long term (current) use of insulin: Secondary | ICD-10-CM

## 2018-07-13 DIAGNOSIS — C9210 Chronic myeloid leukemia, BCR/ABL-positive, not having achieved remission: Secondary | ICD-10-CM | POA: Insufficient documentation

## 2018-07-13 DIAGNOSIS — D508 Other iron deficiency anemias: Secondary | ICD-10-CM

## 2018-07-13 DIAGNOSIS — E1165 Type 2 diabetes mellitus with hyperglycemia: Secondary | ICD-10-CM

## 2018-07-13 DIAGNOSIS — D509 Iron deficiency anemia, unspecified: Secondary | ICD-10-CM | POA: Insufficient documentation

## 2018-07-13 LAB — LACTATE DEHYDROGENASE: LDH: 572 U/L — ABNORMAL HIGH (ref 98–192)

## 2018-07-13 LAB — CMP (CANCER CENTER ONLY)
ALT: 12 U/L (ref 0–44)
AST: 9 U/L — ABNORMAL LOW (ref 15–41)
Albumin: 4.2 g/dL (ref 3.5–5.0)
Alkaline Phosphatase: 90 U/L (ref 38–126)
Anion gap: 10 (ref 5–15)
BUN: 16 mg/dL (ref 6–20)
CO2: 33 mmol/L — ABNORMAL HIGH (ref 22–32)
Calcium: 9.8 mg/dL (ref 8.9–10.3)
Chloride: 99 mmol/L (ref 98–111)
Creatinine: 0.96 mg/dL (ref 0.44–1.00)
GFR, Est AFR Am: >60 mL/min (ref >60)
GFR, Estimated: >60 mL/min (ref >60)
Glucose, Bld: 148 mg/dL — ABNORMAL HIGH (ref 70–99)
Potassium: 3 mmol/L — CL (ref 3.5–5.1)
Sodium: 142 mmol/L (ref 135–145)
Total Bilirubin: 0.4 mg/dL (ref 0.3–1.2)
Total Protein: 6.6 g/dL (ref 6.5–8.1)

## 2018-07-13 LAB — CBC WITH DIFFERENTIAL (CANCER CENTER ONLY)
Abs Immature Granulocytes: 14.36 10*3/uL — ABNORMAL HIGH (ref 0.00–0.07)
Basophils Absolute: 1.9 10*3/uL — ABNORMAL HIGH (ref 0.0–0.1)
Basophils Relative: 3 %
Eosinophils Absolute: 0 10*3/uL (ref 0.0–0.5)
Eosinophils Relative: 0 %
HCT: 38.9 % (ref 36.0–46.0)
Hemoglobin: 12.3 g/dL (ref 12.0–15.0)
Immature Granulocytes: 23 %
Lymphocytes Relative: 9 %
Lymphs Abs: 5.4 10*3/uL — ABNORMAL HIGH (ref 0.7–4.0)
MCH: 29.7 pg (ref 26.0–34.0)
MCHC: 31.6 g/dL (ref 30.0–36.0)
MCV: 94 fL (ref 80.0–100.0)
Monocytes Absolute: 1.8 10*3/uL — ABNORMAL HIGH (ref 0.1–1.0)
Monocytes Relative: 3 %
Neutro Abs: 38.7 10*3/uL — ABNORMAL HIGH (ref 1.7–7.7)
Neutrophils Relative %: 62 %
Platelet Count: 621 10*3/uL — ABNORMAL HIGH (ref 150–400)
RBC: 4.14 MIL/uL (ref 3.87–5.11)
RDW: 17.5 % — ABNORMAL HIGH (ref 11.5–15.5)
WBC Count: 62.2 10*3/uL (ref 4.0–10.5)
nRBC: 0.6 % — ABNORMAL HIGH (ref 0.0–0.2)

## 2018-07-13 LAB — HEMOGLOBIN A1C
Hgb A1c MFr Bld: 8.6 % — ABNORMAL HIGH (ref 4.8–5.6)
Mean Plasma Glucose: 200.12 mg/dL

## 2018-07-13 NOTE — Telephone Encounter (Signed)
Jory Ee NP and Dr. Marin Olp notified of potassium-3.0 and WBC-62.2.  Pt scheduled to be seen today by S. Mastic NP.

## 2018-07-13 NOTE — Progress Notes (Signed)
Hematology and Oncology Follow Up Visit  Tabitha DICOCCO [id number] 1970-12-14 48 y.o. 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 She has remained off of Gleevec due to toxicity causing periorbital and extremity edema.  WBC count is 62.2.  She is symptomatic with fatigue, night sweats (not sleeping well) and abdominal bloating.  No lymphadenopathy noted on her exam.  The swelling has almost completely resolved.  She is currently on a steroid taper due to a COPD exacerbation.  No fever, chills, n/v, cough, rash, dizziness, chest pain, palpitations, abdominal pain or changes in bowel or bladder habits.  No tenderness, numbness or tingling in her extremities at this time.  She states that she has a better appetite on the steroid and is staying well hydrated. Her weight is stable.   ECOG Performance Status: 1 - Symptomatic but completely ambulatory  Medications:  Allergies as of 07/13/2018      Reactions   Dulaglutide Other (See Comments), Nausea And Vomiting   Severe stomach pain Severe stomach pain   Sulfur Nausea And Vomiting   SULFA   Metformin Diarrhea   Metformin And Related Diarrhea      Medication List       Accurate as of Jul 13, 2018 11:28 AM. If you have any questions, ask your nurse or doctor.        albuterol 108 (90 Base) MCG/ACT inhaler Commonly known as:  VENTOLIN HFA Inhale 1-2 puffs into the lungs every 4 (four) hours as needed for wheezing or shortness of breath.   albuterol (2.5 MG/3ML) 0.083% nebulizer solution Commonly known as:  PROVENTIL Inhale the contents of one vial in nebulizer every four to six hours as needed for cough or wheeze.   aluminum-magnesium hydroxide-simethicone 270-623-76 MG/5ML Susp Commonly  known as:  MAALOX Take by mouth.   atorvastatin 80 MG tablet Commonly known as:  LIPITOR Take 1 tablet (80 mg total) by mouth daily.   BIOTIN PO Take by mouth daily.   BLOOD GLUCOSE TEST STRIPS Strp 1 each by Other route 3 (three) times a day. Use as instructed   glucose blood test strip Commonly known as:  Contour Next Test Used to check blood sugars tree times daily.   budesonide-formoterol 160-4.5 MCG/ACT inhaler Commonly known as:  SYMBICORT Inhale 2 puffs into the lungs 2 (two) times daily.   cetirizine 10 MG tablet Commonly known as:  ZYRTEC Can take one tablet by mouth once or twice daily if needed.   ciclopirox 8 % solution Commonly known as:  PENLAC Apply topically.   clobetasol ointment 0.05 % Commonly known as:  TEMOVATE Apply 1 application topically 2 (two) times daily.   famotidine 40 MG/5ML suspension Commonly known as:  PEPCID Take by mouth.   Flomax 0.4 MG Caps capsule Generic drug:  tamsulosin Take 0.4 mg by mouth.   fluticasone 50 MCG/ACT nasal spray Commonly known as:  FLONASE Use two sprays in each nostril once daily as directed.   furosemide 40 MG tablet Commonly known as:  LASIX Take 1 pill daily for 3 days for facial swelling.  Then take daily as needed.   insulin aspart 100 UNIT/ML FlexPen Commonly known as:  NovoLOG  FlexPen Inject 40 Units into the skin daily with supper. What changed:    how much to take  additional instructions   Insulin Glargine 100 UNIT/ML Solostar Pen Commonly known as:  Lantus SoloStar Inject 140 Units into the skin daily. And pen needles 5/day What changed:  how much to take   meclizine 25 MG tablet Commonly known as:  ANTIVERT Take 1 tablet (25 mg total) by mouth 3 (three) times daily as needed for dizziness.   meloxicam 7.5 MG tablet Commonly known as:  MOBIC Take 7.5 mg by mouth daily.   metFORMIN 500 MG 24 hr tablet Commonly known as:  GLUCOPHAGE-XR Take by mouth.   montelukast 10 MG tablet  Commonly known as:  SINGULAIR Take 1 tablet (10 mg total) by mouth daily.   nystatin powder Commonly known as:  MYCOSTATIN/NYSTOP Apply 1 g topically 2 (two) times daily as needed.   omeprazole 40 MG capsule Commonly known as:  PRILOSEC Take 1 capsule (40 mg total) by mouth every morning.   ondansetron 8 MG disintegrating tablet Commonly known as:  ZOFRAN-ODT Dissolve 1 tablet (8 mg total) by mouth every 8 (eight) hours as needed for nausea.   pregabalin 200 MG capsule Commonly known as:  LYRICA TAKE 1 CAPSULE BY MOUTH 2 TIMES DAILY.   tiotropium 18 MCG inhalation capsule Commonly known as:  [place] into inhaler and inhale.   valACYclovir 500 MG tablet Commonly known as:  VALTREX Take 1 tablet (500 mg total) by mouth at bedtime.   varenicline 0.5 MG X 11 & 1 MG X 42 tablet Commonly known as:  Chantix Starting Month Pak Take one 0.5mg  tablet by mouth qd for 3 days, then increase to one 0.5mg  tablet bid for 3 days, then increase to one 1mg  tablet bid.       Allergies:  Allergies  Allergen Reactions  . Dulaglutide Other (See Comments) and Nausea And Vomiting    Severe stomach pain Severe stomach pain  . Sulfur Nausea And Vomiting    SULFA  . Metformin Diarrhea  . Metformin And Related Diarrhea    Past Medical History, Surgical history, Social history, and Family History were reviewed and updated.  Review of Systems: All other 10 point review of systems is negative.   Physical Exam:  vitals were not taken for this visit.   Wt Readings from Last 3 Encounters:  06/23/18 258 lb (117 kg)  06/15/18 270 lb (122.5 kg)  06/09/18 262 lb (118.8 kg)    Ocular: Sclerae unicteric, pupils equal, round and reactive to light Ear-nose-throat: Oropharynx clear, dentition fair Lymphatic: No cervical or supraclavicular adenopathy Lungs no rales or rhonchi, good excursion bilaterally Heart regular rate and rhythm, no murmur appreciated Abd soft, nontender, positive  bowel sounds, no liver or spleen tip palpated on exam, no fluid wave  MSK no focal spinal tenderness, no joint edema Neuro: non-focal, well-oriented, appropriate affect Breasts: Deferred   Lab Results  Component Value Date   WBC 62.2 (HH) 07/13/2018   HGB 12.3 07/13/2018   HCT 38.9 07/13/2018   MCV 94.0 07/13/2018   PLT 621 (H) 07/13/2018   Lab Results  Component Value Date   FERRITIN 411 (H) 05/31/2018   IRON 44 05/31/2018   TIBC 223 (L) 05/31/2018   UIBC 178 05/31/2018   IRONPCTSAT 20 (L) 05/31/2018   Lab Results  Component Value Date   RBC 4.14 07/13/2018   No results found for: KPAFRELGTCHN, LAMBDASER, KAPLAMBRATIO Lab Results  Component Value Date  IGGSERUM 675 (L) 11/10/2016   IGMSERUM 86 11/10/2016   No results found for: Odetta Pink, SPEI   Chemistry      Component Value Date/Time   NA 142 07/13/2018 1019   NA 144 01/27/2017 1302   NA 138 09/16/2016 1105   K 3.0 (LL) 07/13/2018 1019   K 4.3 01/27/2017 1302   K 4.2 09/16/2016 1105   CL 99 07/13/2018 1019   CL 102 01/27/2017 1302   CO2 33 (H) 07/13/2018 1019   CO2 27 01/27/2017 1302   CO2 26 09/16/2016 1105   BUN 16 07/13/2018 1019   BUN 14 01/27/2017 1302   BUN 11.9 09/16/2016 1105   CREATININE 0.96 07/13/2018 1019   CREATININE 1.1 01/27/2017 1302   CREATININE 0.8 09/16/2016 1105      Component Value Date/Time   CALCIUM 9.8 07/13/2018 1019   CALCIUM 9.5 01/27/2017 1302   CALCIUM 9.0 09/16/2016 1105   ALKPHOS 90 07/13/2018 1019   ALKPHOS 116 (H) 01/27/2017 1302   ALKPHOS 136 09/16/2016 1105   AST 9 (L) 07/13/2018 1019   AST 8 09/16/2016 1105   ALT 12 07/13/2018 1019   ALT 25 01/27/2017 1302   ALT 12 09/16/2016 1105   BILITOT 0.4 07/13/2018 1019   BILITOT 0.32 09/16/2016 1105       Impression and Plan: Tabitha Estrada is a very pleasant 48 yo with chronic phase CML and history of iron deficiency anemia.  Unfortunately she did not tolerate  Gleevec and we had to stop this 3 weeks ago.  Her WBC count now up to 62.2. I spoke with Dr. Marin Olp and we will have her start Sprycel.  We got a baseline EKG today prior to her starting Sprycel which showed normal sinus rhythm.  Hgb A1c drawn per PCP request.  She is scheduled to have her ECHO on 6/3.  We will see her back in another 3 weeks for follow-up.  She will contact our office with any questions or concerns. We can certainly see her sooner if need be.   Laverna [name], NP 5/14/202011:29 AM

## 2018-07-14 ENCOUNTER — Telehealth: Payer: Self-pay | Admitting: Family

## 2018-07-14 ENCOUNTER — Other Ambulatory Visit: Payer: Medicaid Other

## 2018-07-14 ENCOUNTER — Other Ambulatory Visit: Payer: Self-pay | Admitting: Hematology & Oncology

## 2018-07-14 ENCOUNTER — Ambulatory Visit: Payer: Medicaid Other | Admitting: Family

## 2018-07-14 ENCOUNTER — Telehealth: Payer: Self-pay | Admitting: Pharmacist

## 2018-07-14 MED ORDER — DASATINIB 100 MG PO TABS: 100.0000 mg | ORAL_TABLET | Freq: Every day | ORAL | 6 refills | Status: DC

## 2018-07-14 NOTE — Telephone Encounter (Signed)
No los 5/14

## 2018-07-14 NOTE — Telephone Encounter (Signed)
Oral Oncology Pharmacist Encounter  Received new prescription for Sprycel (dasatinib) for the treatment of CML, planned duration until disease progression or unacceptable drug toxicity. Patient was previously on imatinib but that was discontinued due to periorbital edema.  CMP/CBC from 07/13/2018 assessed, no relevant lab abnormalities. Prescription dose and frequency assessed.   Current medication list in Epic reviewed, several DDIs with dasatinib identified: - Proton Pump Inhibitors (omeprazole) and H2 antagonist (famotidine) may decrease the serum concentration of dasatinib. Category X interaction, combination should be avoided. Recommend stopping famotidine and pantoprazole and using antacids (calcium carbonate, Maalox) if acid-reduction is needed. Administer antacids 2 hours before or 2 hours after dasatinib.  Prescription has been e-scribed to the [facility] for benefits analysis and approval.  Oral Oncology Clinic will continue to follow for insurance authorization, copayment issues, initial counseling and start date.  Darl Pikes, PharmD, BCPS, [facility] Hematology/Oncology Clinical Pharmacist [facility] [phone]  07/14/2018 4:56 PM

## 2018-07-17 ENCOUNTER — Telehealth: Payer: Self-pay | Admitting: Pharmacy Technician

## 2018-07-17 MED FILL — SPRYCEL 100 MG TABLET: 100 | 30 days supply | Qty: 30 | Fill #0

## 2018-07-17 NOTE — Telephone Encounter (Signed)
Oral Oncology Patient Advocate Encounter  After completing a benefits investigation, prior authorization for Sprycel is not required at this time through [facility].  Patient's copay is $3.00.  I have set up shipment of the medication through [facility] and patient should receive medication 5/19.   [place] Patient Tabitha Estrada Phone [phone] Fax [phone] 07/17/2018 12:10 PM

## 2018-07-17 NOTE — Telephone Encounter (Addendum)
Oral Chemotherapy Pharmacist Encounter  Ms. Finck will start the medication when she receives it in the mail on 5/19.  Patient Education I spoke with patient for overview of new oral chemotherapy medication: Sprycel (dasatinib) for the treatment of CML, planned duration until disease progression or unacceptable drug toxicity. Patient was previously on imatinib but that was discontinued due to periorbital edema.  Counseled patient on administration, dosing, side effects, monitoring, drug-food interactions, safe handling, storage, and disposal. Patient will take 1 tablet (100 mg total) by mouth daily.  Recommend stopping famotidine and pantoprazole and using antacids (calcium carbonate, Maalox) if acid-reduction is needed. Administer antacids 2 hours before or 2 hours after dasatinib.  Side effects include but not limited to: decreased wbc/hgb/plt, diarrhea, edema, HA.    Reviewed with patient importance of keeping a medication schedule and plan for any missed doses.  Ms. Timme voiced understanding and appreciation.  All questions answered. Medication handout placed in the mail.  Provided patient with [facility] phone number. Patient knows to call the office with questions or concerns. Oral Chemotherapy Navigation Clinic will continue to follow.  Darl Pikes, PharmD, BCPS, [facility] Hematology/Oncology Clinical Pharmacist [facility] [phone]  07/17/2018 12:16 PM

## 2018-07-25 ENCOUNTER — Telehealth: Payer: Self-pay | Admitting: Pharmacist

## 2018-07-25 NOTE — Telephone Encounter (Signed)
Oral Chemotherapy Pharmacist Encounter   Patient called me to see if the medications she was taking for her back pain would interact with her Sprycel (dasatinib). She reported taking oxycodone and meloxicam. The only interaction is with meloxicam. Meloxicam and dasatinib together can have an increased antiplatelet effect. Based on the plt count from the CBC on 07/13/18, showing her plt above the ULN, this interaction is not considering. Her pltc should continue to be monitored.   Darl Pikes, PharmD, BCPS, [facility] Hematology/Oncology Clinical Pharmacist [facility] [phone]  07/25/2018 3:58 PM

## 2018-07-26 LAB — BCR/ABL

## 2018-07-28 ENCOUNTER — Emergency Department (HOSPITAL_BASED_OUTPATIENT_CLINIC_OR_DEPARTMENT_OTHER): Payer: Medicaid Other

## 2018-07-28 ENCOUNTER — Encounter (HOSPITAL_COMMUNITY): Payer: Self-pay | Admitting: Emergency Medicine

## 2018-07-28 ENCOUNTER — Telehealth: Payer: Self-pay | Admitting: *Deleted

## 2018-07-28 ENCOUNTER — Encounter: Payer: Self-pay | Admitting: Hematology & Oncology

## 2018-07-28 ENCOUNTER — Emergency Department (HOSPITAL_COMMUNITY): Payer: Medicaid Other

## 2018-07-28 ENCOUNTER — Emergency Department (HOSPITAL_COMMUNITY)
Admission: EM | Admit: 2018-07-28 | Discharge: 2018-07-28 | Disposition: A | Discharge status: Home / Self Care | Payer: Medicaid Other | Attending: Emergency Medicine | Admitting: Emergency Medicine

## 2018-07-28 ENCOUNTER — Other Ambulatory Visit: Payer: Self-pay

## 2018-07-28 DIAGNOSIS — E119 Type 2 diabetes mellitus without complications: Secondary | ICD-10-CM | POA: Insufficient documentation

## 2018-07-28 DIAGNOSIS — Z856 Personal history of leukemia: Secondary | ICD-10-CM | POA: Diagnosis not present

## 2018-07-28 DIAGNOSIS — Z794 Long term (current) use of insulin: Secondary | ICD-10-CM | POA: Insufficient documentation

## 2018-07-28 DIAGNOSIS — M79604 Pain in right leg: Secondary | ICD-10-CM | POA: Diagnosis not present

## 2018-07-28 DIAGNOSIS — F17210 Nicotine dependence, cigarettes, uncomplicated: Secondary | ICD-10-CM | POA: Diagnosis not present

## 2018-07-28 DIAGNOSIS — M7989 Other specified soft tissue disorders: Secondary | ICD-10-CM

## 2018-07-28 DIAGNOSIS — J449 Chronic obstructive pulmonary disease, unspecified: Secondary | ICD-10-CM | POA: Insufficient documentation

## 2018-07-28 DIAGNOSIS — Z79899 Other long term (current) drug therapy: Secondary | ICD-10-CM | POA: Insufficient documentation

## 2018-07-28 LAB — BASIC METABOLIC PANEL
Anion gap: 11 (ref 5–15)
BUN: 10 mg/dL (ref 6–20)
CO2: 27 mmol/L (ref 22–32)
Calcium: 9.1 mg/dL (ref 8.9–10.3)
Chloride: 99 mmol/L (ref 98–111)
Creatinine, Ser: 0.97 mg/dL (ref 0.44–1.00)
GFR calc Af Amer: >60 mL/min (ref >60)
GFR calc non Af Amer: >60 mL/min (ref >60)
Glucose, Bld: 215 mg/dL — ABNORMAL HIGH (ref 70–99)
Potassium: 3.8 mmol/L (ref 3.5–5.1)
Sodium: 137 mmol/L (ref 135–145)

## 2018-07-28 LAB — CBC WITH DIFFERENTIAL/PLATELET
Abs Immature Granulocytes: 5.48 10*3/uL — ABNORMAL HIGH (ref 0.00–0.07)
Basophils Absolute: 1 10*3/uL — ABNORMAL HIGH (ref 0.0–0.1)
Basophils Relative: 3 %
Eosinophils Absolute: 0.3 10*3/uL (ref 0.0–0.5)
Eosinophils Relative: 1 %
HCT: 37.7 % (ref 36.0–46.0)
Hemoglobin: 11.7 g/dL — ABNORMAL LOW (ref 12.0–15.0)
Immature Granulocytes: 14 %
Lymphocytes Relative: 8 %
Lymphs Abs: 3.3 10*3/uL (ref 0.7–4.0)
MCH: 29.8 pg (ref 26.0–34.0)
MCHC: 31 g/dL (ref 30.0–36.0)
MCV: 96.2 fL (ref 80.0–100.0)
Monocytes Absolute: 0.6 10*3/uL (ref 0.1–1.0)
Monocytes Relative: 1 %
Neutro Abs: 28.8 10*3/uL — ABNORMAL HIGH (ref 1.7–7.7)
Neutrophils Relative %: 73 %
Platelets: 256 10*3/uL (ref 150–400)
RBC: 3.92 MIL/uL (ref 3.87–5.11)
RDW: 18.1 % — ABNORMAL HIGH (ref 11.5–15.5)
WBC: 39.4 10*3/uL — ABNORMAL HIGH (ref 4.0–10.5)
nRBC: 0.7 % — ABNORMAL HIGH (ref 0.0–0.2)

## 2018-07-28 MED ORDER — ONDANSETRON HCL 4 MG PO TABS
4.0000 mg | ORAL_TABLET | Freq: Once | ORAL | Status: AC
  Administered 2018-07-28: 4 mg via ORAL
  Filled 2018-07-28: qty 1

## 2018-07-28 MED ORDER — OXYCODONE-ACETAMINOPHEN 5-325 MG PO TABS
1.0000 | ORAL_TABLET | Freq: Once | ORAL | Status: AC
  Administered 2018-07-28: 1 via ORAL
  Filled 2018-07-28: qty 1

## 2018-07-28 MED ORDER — OXYCODONE-ACETAMINOPHEN 5-325 MG PO TABS: 1.0000 | ORAL_TABLET | Freq: Four times a day (QID) | ORAL | 0 refills | Status: DC | PRN

## 2018-07-28 MED ORDER — ALBUTEROL SULFATE HFA 108 (90 BASE) MCG/ACT IN AERS
6.0000 | INHALATION_SPRAY | Freq: Once | RESPIRATORY_TRACT | Status: AC
  Administered 2018-07-28: 6 via RESPIRATORY_TRACT
  Filled 2018-07-28: qty 6.7

## 2018-07-28 NOTE — ED Provider Notes (Signed)
[facility] EMERGENCY DEPARTMENT Provider Note   CSN: [id number] Arrival date & time: 07/28/18  1504    History   Chief Complaint Chief Complaint  Patient presents with   Leg Pain    HPI Tabitha Estrada is a 48 y.o. 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 Pt admits to starting Spyrcel x10 days ago. She called her pcp office for this leg pain and was advised to stop taking Spyrcel and be evaluated in the ED. She describes the pain in her right leg as constant throbbing with intermittent sharp pain. Also admits to associated pruritus and swelling from the knee down. She has taken oxycodone with symptom improvement.  She is also reporting worsening dyspnea over the last month with cough. Cough was nonproductive, however over the last week she admits to productive cough with yellow sputum. She has been wheezing. She has symptom improvement after using home nebulizer. She is compliant with CPAP use. She admits to smoking 1.5 ppd. She denies fever, nausea, vomiting, abdominal pain, urinary symptoms. She admits to decreased appetite, chills, diarrhea. She takes Imodium for her diarrhea with improvement.  Chart review shows oncologist is Dr. Marin Olp.    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/2018   COPD (chronic obstructive pulmonary disease) ([place])    Depression    Diabetes mellitus without complication ([place])    Dyspnea    with exertion and bronchititis   Fibromyalgia    GERD (gastroesophageal reflux disease)    History of hiatal hernia    History of kidney stones    Hyperlipidemia    Neuromuscular disorder ([place])    Pneumonia    hs, 01-19-15   Sleep apnea    has Cpap have not been using    Patient Active Problem List   Diagnosis Date Noted   CML (chronic  myelocytic leukemia) ([place]) 05/31/2018   Perimenopause 07/13/2017   Acute sinusitis 06/30/2017   Allergic rhinitis 06/30/2017   Carotid atherosclerosis, bilateral 05/18/2017   Restrictive lung disease 11/15/2016   Status migrainosus 10/01/2016   IDA (iron deficiency anemia) 09/16/2016   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   Vitamin D deficiency 03/21/2016   Chronic cough 12/24/2015   Type 2 diabetes mellitus with hyperglycemia ([place]) 12/24/2015   Pruritus 12/24/2015   Urinary frequency 12/24/2015   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   Dependence on nicotine from cigarettes 02/01/2014   Vitamin B12 deficiency 02/01/2014   Left ureteral stone 12/08/2013   Lumbar disc herniation 10/07/2013   COPD mixed type ([place]) 05/08/2013   Peripheral autonomic neuropathy in disorders classified elsewhere 09/18/2012   IBS (irritable bowel syndrome) 03/28/2011   History of tobacco use 02/04/2011   Hypertension 12/23/2010   OSA on CPAP 04/23/2009    Past Surgical History:  Procedure Laterality Date   BACK SURGERY  2015,2016, 2018   CARPAL TUNNEL RELEASE Right 2008   [place]  2015   TONSILLECTOMY  1979   TUBAL LIGATION  2005     OB History    Gravida  7   Para  3   Term      Preterm      AB  4   Living  3  SAB  4   TAB      Ectopic      Multiple      Live Births               Home Medications    Prior to Admission medications   Medication Sig Start Date End Date Taking? Authorizing Provider  albuterol (PROVENTIL HFA;VENTOLIN HFA) 108 (90 Base) MCG/ACT inhaler Inhale 1-2 puffs into the lungs every 4 (four) hours as needed for wheezing or shortness of breath. 12/24/15   Emeterio Reeve, DO  albuterol (PROVENTIL) (2.5 MG/3ML) 0.083% nebulizer solution Inhale the contents of one vial in nebulizer every four to six hours as needed for cough or wheeze. 11/09/16   Kozlow, Donnamarie Poag, MD    aluminum-magnesium hydroxide-simethicone ([place]) 200-200-20 MG/5ML SUSP Take by mouth. 06/13/18   [provider]  atorvastatin (LIPITOR) 80 MG tablet Take 1 tablet (80 mg total) by mouth daily. 01/13/18   Emeterio Reeve, DO  BIOTIN PO Take by mouth daily.    [provider]  budesonide-formoterol (SYMBICORT) 160-4.5 MCG/ACT inhaler Inhale 2 puffs into the lungs 2 (two) times daily. 11/01/17   Silverio Decamp, MD  cetirizine (ZYRTEC) 10 MG tablet Can take one tablet by mouth once or twice daily if needed. 07/11/17   Emeterio Reeve, DO  ciclopirox [facility]) 8 % solution Apply topically. 05/08/18 08/06/18  [provider]  clobetasol ointment (TEMOVATE) 1.30 % Apply 1 application topically 2 (two) times daily. 10/14/16   Silverio Decamp, MD  dasatinib (SPRYCEL) 100 MG tablet Take 1 tablet (100 mg total) by mouth daily. 07/14/18   [name], MD  famotidine (PEPCID) 40 MG/5ML suspension Take by mouth. 06/13/18 06/27/18  [provider]  fluticasone (FLONASE) 50 MCG/ACT nasal spray Use two sprays in each nostril once daily as directed. 03/28/17   Collene Gobble, MD  furosemide (LASIX) 40 MG tablet Take 1 pill daily for 3 days for facial swelling.  Then take daily as needed. 06/15/18   [name], MD  Glucose Blood (BLOOD GLUCOSE TEST STRIPS) STRP 1 each by Other route 3 (three) times a day. Use as instructed 07/10/15   [provider]  glucose blood (CONTOUR NEXT TEST) test strip Used to check blood sugars tree times daily. 08/15/17   Renato Shin, MD  insulin aspart (NOVOLOG FLEXPEN) 100 UNIT/ML FlexPen Inject 40 Units into the skin daily with supper. Patient taking differently: Inject into the skin daily with supper. Sliding scale 10-40 units 07/08/17   Renato Shin, MD  Insulin Glargine (LANTUS SOLOSTAR) 100 UNIT/ML Solostar Pen Inject 140 Units into the skin daily. And pen needles 5/day Patient taking differently: Inject 100 Units  into the skin daily. And pen needles 5/day 07/08/17   Renato Shin, MD  meclizine (ANTIVERT) 25 MG tablet Take 1 tablet (25 mg total) by mouth 3 (three) times daily as needed for dizziness. 03/14/18   Emeterio Reeve, DO  meloxicam (MOBIC) 7.5 MG tablet Take 7.5 mg by mouth daily. 03/13/18   [provider]  metFORMIN (GLUCOPHAGE-XR) 500 MG 24 hr tablet Take by mouth. 05/30/18   [provider]  montelukast (SINGULAIR) 10 MG tablet Take 1 tablet (10 mg total) by mouth daily. 11/18/17   Emeterio Reeve, DO  nystatin (MYCOSTATIN/NYSTOP) powder Apply 1 g topically 2 (two) times daily as needed. 07/22/17   Emeterio Reeve, DO  omeprazole (PRILOSEC) 40 MG capsule Take 1 capsule (40 mg total) by mouth every morning. 07/11/17  Emeterio Reeve, DO  ondansetron (ZOFRAN-ODT) 8 MG disintegrating tablet Dissolve 1 tablet (8 mg total) by mouth every 8 (eight) hours as needed for nausea. 11/02/16   [provider]  oxyCODONE-acetaminophen (PERCOCET/ROXICET) 5-325 MG tablet Take 1 tablet by mouth every 6 (six) hours as needed for severe pain. 07/28/18   Nussen Pullin E, PA-C  predniSONE (DELTASONE) 20 MG tablet 40 mg for 4 days, 20 mg for 2 days, 10 mg for 2 days 07/10/18   [provider]  pregabalin (LYRICA) 200 MG capsule TAKE 1 CAPSULE BY MOUTH 2 TIMES DAILY. 06/20/18   Emeterio Reeve, DO  tamsulosin (FLOMAX) 0.4 MG CAPS capsule Take 0.4 mg by mouth. 08/02/17   [provider]  tiotropium (SPIRIVA) 18 MCG inhalation capsule Place into inhaler and inhale. 04/17/18   [provider]  valACYclovir (VALTREX) 500 MG tablet Take 1 tablet (500 mg total) by mouth at bedtime. 07/11/17   Emeterio Reeve, DO  varenicline (CHANTIX STARTING MONTH PAK) 0.5 MG X 11 & 1 MG X 42 tablet Take one 0.5mg  tablet by mouth qd for 3 days, then increase to one 0.5mg  tablet bid for 3 days, then increase to one 1mg  tablet bid. Patient not taking: Reported on 05/31/2018 11/01/17    Silverio Decamp, MD    Family History Family History  Problem Relation Age of Onset   Diabetes Father    Hyperlipidemia Father    Hypertension Father    Diabetes Paternal Aunt    Hypertension Paternal Aunt    Heart attack Paternal Uncle    Emphysema Maternal Grandmother    Heart failure Maternal Grandmother    Cancer Maternal Grandfather    Colon cancer Neg Hx     Social History Social History   Tobacco Use   Smoking status: Current Every Day Smoker    Packs/day: 1.00    Years: 35.00    Pack years: 35.00    Types: Cigarettes   Smokeless tobacco: Never Used   Tobacco comment: 05/17/2016 still smokes  Substance Use Topics   Alcohol use: No   Drug use: No     Allergies   Dulaglutide; Sulfur; Metformin; and Metformin and related   Review of Systems Review of Systems  Constitutional: Positive for appetite change and chills. Negative for fever.  HENT: Negative for congestion, ear discharge, ear pain, sinus pressure, sinus pain and sore throat.   Eyes: Negative for pain and redness.  Respiratory: Positive for cough and wheezing. Negative for shortness of breath.   Cardiovascular: Positive for leg swelling. Negative for chest pain.  Gastrointestinal: Positive for diarrhea. Negative for abdominal pain, constipation, nausea and vomiting.  Genitourinary: Negative for dysuria and hematuria.  Musculoskeletal: Negative for back pain and neck pain.  Skin: Negative for wound.  Neurological: Negative for weakness, numbness and headaches.     Physical Exam Updated Vital Signs BP 127/67    Pulse 78    Temp 98.5 F (36.9 C) (Oral)    Resp 16    LMP 11/17/2016 (Approximate)    SpO2 94% Comment: MAINTAINED WHILE AMBULATING  Physical Exam Vitals signs and nursing note reviewed.  Constitutional:      General: She is not in acute distress.    Appearance: She is not ill-appearing.  HENT:     Head: Normocephalic and atraumatic.     Right Ear: Tympanic  membrane and external ear normal.     Left Ear: Tympanic membrane and external ear normal.     Nose: Nose  normal.     Mouth/Throat:     Mouth: Mucous membranes are moist.     Pharynx: Oropharynx is clear.  Eyes:     General: No scleral icterus.       Right eye: No discharge.        Left eye: No discharge.     Extraocular Movements: Extraocular movements intact.     Conjunctiva/sclera: Conjunctivae normal.     Pupils: Pupils are equal, round, and reactive to light.  Neck:     Musculoskeletal: Normal range of motion.     Vascular: No JVD.  Cardiovascular:     Rate and Rhythm: Normal rate and regular rhythm.     Pulses: Normal pulses.          Radial pulses are 2+ on the right side and 2+ on the left side.     Heart sounds: Normal heart sounds.  Pulmonary:     Comments: Expiratory wheeze in all fields. No accessory muscle use. Symmetric chest rise. Normal work of breathing. Spo2 is 99% on room air. Abdominal:     Comments: Abdomen is soft, non-distended, and non-tender in all quadrants. No rigidity, no guarding. No peritoneal signs.  Musculoskeletal: Normal range of motion.     Right lower leg: Edema present.     Comments: Negative Homans sign on bilateral lower extremity. Edema in right lower extremity, no palpable cords, compartment is soft. DP 2+ bilaterally.  Skin:    General: Skin is warm and dry.     Capillary Refill: Capillary refill takes less than 2 seconds.  Neurological:     Mental Status: She is oriented to person, place, and time.     GCS: GCS eye subscore is 4. GCS verbal subscore is 5. GCS motor subscore is 6.     Comments: Fluent speech, no facial droop.  Psychiatric:        Mood and Affect: Affect is tearful.        Behavior: Behavior normal.      ED Treatments / Results  Labs (all labs ordered are listed, but only abnormal results are displayed) Labs Reviewed  CBC WITH DIFFERENTIAL/PLATELET - Abnormal; Notable for the following components:      Result  Value   WBC 39.4 (*)    Hemoglobin 11.7 (*)    RDW 18.1 (*)    nRBC 0.7 (*)    Neutro Abs 28.8 (*)    Basophils Absolute 1.0 (*)    Abs Immature Granulocytes 5.48 (*)    All other components within normal limits  BASIC METABOLIC PANEL - Abnormal; Notable for the following components:   Glucose, Bld 215 (*)    All other components within normal limits    EKG None  Radiology Dg Chest Portable 1 View  Result Date: 07/28/2018 CLINICAL DATA:  Cough, shortness of breath lower leg pain today. EXAM: PORTABLE CHEST 1 VIEW COMPARISON:  PA and lateral chest 03/14/2018 and 03/17/2017. FINDINGS: The lungs are clear. Heart size is upper normal. No pneumothorax or pleural effusion. No acute or focal bony abnormality. IMPRESSION: No acute disease. Electronically Signed   By: Inge Rise M.D.   On: 07/28/2018 18:33   Vas [place] Lower Extremity Venous (dvt) (only [facility])  Result Date: 07/28/2018  Lower Venous Study Indications: Swelling.  Limitations: Body habitus and poor ultrasound/tissue interface. Performing Technologist: Maudry Mayhew RDMS, RVT, RDCS  Examination Guidelines: A complete evaluation includes B-mode imaging, spectral Doppler, color Doppler, and power Doppler as needed of  all accessible portions of each vessel. Bilateral testing is considered an integral part of a complete examination. Limited examinations for reoccurring indications may be performed as noted.  +---------+---------------+---------+-----------+----------+--------------+  RIGHT     Compressibility Phasicity Spontaneity Properties Summary         +---------+---------------+---------+-----------+----------+--------------+  CFV       Full            Yes       Yes                                    +---------+---------------+---------+-----------+----------+--------------+  SFJ       Full                                                             +---------+---------------+---------+-----------+----------+--------------+  FV  Prox   Full                                                             +---------+---------------+---------+-----------+----------+--------------+  FV Mid    Full                                                             +---------+---------------+---------+-----------+----------+--------------+  FV Distal Full                                                             +---------+---------------+---------+-----------+----------+--------------+  PFV       Full                                                             +---------+---------------+---------+-----------+----------+--------------+  POP       Full            Yes       Yes                                    +---------+---------------+---------+-----------+----------+--------------+  PTV       Full                                                             +---------+---------------+---------+-----------+----------+--------------+  PERO  Not visualized  +---------+---------------+---------+-----------+----------+--------------+   +----+---------------+---------+-----------+----------+--------------+  LEFT Compressibility Phasicity Spontaneity Properties Summary         +----+---------------+---------+-----------+----------+--------------+  CFV                                                   Not visualized  +----+---------------+---------+-----------+----------+--------------+     Summary: Right: There is no evidence of deep vein thrombosis in the lower extremity. However, portions of this examination were limited- see technologist comments above. No cystic structure found in the popliteal fossa.  *See table(s) above for measurements and observations.    Preliminary     Procedures Procedures (including critical care time)  Medications Ordered in ED Medications  albuterol (VENTOLIN HFA) 108 (90 Base) MCG/ACT inhaler 6 puff (6 puffs Inhalation Given 07/28/18 1912)  oxyCODONE-acetaminophen  (PERCOCET/ROXICET) 5-325 MG per tablet 1 tablet (1 tablet Oral Given 07/28/18 2002)  ondansetron (ZOFRAN) tablet 4 mg (4 mg Oral Given 07/28/18 2001)     Initial Impression / Assessment and Plan / ED Course  I have reviewed the triage vital signs and the nursing notes.  Pertinent labs & imaging results that were available during my care of the patient were reviewed by me and considered in my medical decision making (see chart for details).  48 yo female with CML presents with right lower extremity edema and pain. Oncologist recommends she stop Sprycel as it might have caused leg pain and itching. On arrival she is afebrile, not hypoxic, not tachycardic. On exam she has expiratory wheeze. Labs show leukocytosis of 39.4.  Review shows 2 weeks ago it was 62.2, today's result shows trending down. BMP shows hyperglycemia of 215, otherwise without metabolic derangements. [place] negative for DVT. Chest xray viewed by me is without infiltrate, doubt infectious process. Pt given Albuterol inhaler 6 puffs with improvement of wheezing. Pt ambulated without difficulty and SpO2 was 94% without desat. Engaged in shared decision making with pt, as [place] is negative, this dyspnea could be caused by PE. She is declining CTA chest because she says her symptoms are similar to COPD exacerbations in the past. She will continue home symptomatic treatment. She continues to have reassuring vitals during ED visit.  She was always normotensive, without tachycardia or tachypnea.  Leg pain improved with home dose of oxycodone. PDMP reviewed during this encounter.   Patient is hemodynamically stable at discharge, in NAD, and able to ambulate in the ED. Evaluation does not show pathology that would require ongoing emergent intervention or inpatient treatment. I explained the diagnosis to the patient. Pain has been managed and has no complaints prior to discharge. Patient is comfortable with above plan and is stable for discharge at this time.  All questions were answered prior to disposition. Strict return precautions for returning to the ED were discussed. Encouraged follow up with PCP and oncologist.  She will need to have blood work rechecked given her elevated white count. Findings and plan of care discussed with supervising physician Dr. Laverta Baltimore.   Final Clinical Impressions(s) / ED Diagnoses   Final diagnoses:  Right leg pain    ED Discharge Orders         Ordered    oxyCODONE-acetaminophen (PERCOCET/ROXICET) 5-325 MG tablet  Every 6 hours PRN     07/28/18 2045           Siegfried Vieth, [place] E,  PA-C 07/28/18 2105    Margette Fast, MD 07/29/18 1241

## 2018-07-28 NOTE — ED Triage Notes (Signed)
Pt arrives with c/o of right calf pain and swelling to rule out blood clot.

## 2018-07-28 NOTE — Discharge Instructions (Addendum)
You have been seen today for leg pain. Please read and follow all provided instructions. Return to the emergency room for worsening condition or new concerning symptoms.    The ultrasound was negative for DVT in your right leg.   1. Medications:  Prescription sent to your pharmacy for oxycodone. You can take this as needed only for severe leg pain, please taker as directed. We recommend taking tylenol and ibuprofen for pain as needed. Continue usual home medications Take medications as prescribed. Please review all of the medicines and only take them if you do not have an allergy to them.   2. Treatment: rest, drink plenty of fluids  3. Follow Up:  Please call your primary care doctor and oncologist on Monday to schedule follow-up appointments.  You will need your blood work checked again.   It is also a possibility that you have an allergic reaction to any of the medicines that you have been prescribed - Everybody reacts differently to medications and while MOST people have no trouble with most medicines, you may have a reaction such as nausea, vomiting, rash, swelling, shortness of breath. If this is the case, please stop taking the medicine immediately and contact your physician.  ?

## 2018-07-28 NOTE — Telephone Encounter (Signed)
Call placed to patient to review her symptoms with her after receiving her MyChart concerns.  Patient states that she has had itching and diarrhea for the past four days.  She states that she is having diarrhea approx 3-4 times a day and is taking imodium which is helping some.  She states that she has no appetite, no fever but yes to chills, SOB and a new sharp pain to her entire right leg.  Dr. Marin Olp notified and order received for patient to go to the ED now and to stop Sprycel.  Call placed to patient to notify her that Dr. Marin Olp would like for her to go to the ED now to have her right leg evaluated for a blood clot.  Teach back done.  Patient appreciative of call and states that she will go to the ED now.

## 2018-07-28 NOTE — Progress Notes (Signed)
Right lower extremity venous duplex completed. Refer to "CV Proc" under chart review to view preliminary results.  07/28/2018 6:38 PM Maudry Mayhew, MHA, RVT, RDCS, RDMS

## 2018-08-02 ENCOUNTER — Other Ambulatory Visit: Payer: Self-pay

## 2018-08-02 ENCOUNTER — Ambulatory Visit (HOSPITAL_BASED_OUTPATIENT_CLINIC_OR_DEPARTMENT_OTHER)
Admission: RE | Admit: 2018-08-02 | Discharge: 2018-08-02 | Disposition: A | Discharge status: Home / Self Care | Payer: Medicaid Other | Source: Ambulatory Visit | Attending: Family | Admitting: Family

## 2018-08-02 ENCOUNTER — Telehealth: Payer: Self-pay | Admitting: *Deleted

## 2018-08-02 DIAGNOSIS — C9210 Chronic myeloid leukemia, BCR/ABL-positive, not having achieved remission: Secondary | ICD-10-CM

## 2018-08-02 NOTE — Telephone Encounter (Signed)
Pt notified that echo looks good and that she has the heart of a champion.  Pt appreciative of call and has no questions or concerns at this time.

## 2018-08-02 NOTE — Progress Notes (Signed)
  Echocardiogram 2D Echocardiogram has been performed.  Tabitha Estrada 08/02/2018, 11:40 AM

## 2018-08-04 ENCOUNTER — Inpatient Hospital Stay (HOSPITAL_BASED_OUTPATIENT_CLINIC_OR_DEPARTMENT_OTHER): Payer: Medicaid Other | Admitting: Family

## 2018-08-04 ENCOUNTER — Other Ambulatory Visit: Payer: Self-pay | Admitting: Family

## 2018-08-04 ENCOUNTER — Encounter: Payer: Self-pay | Admitting: Family

## 2018-08-04 ENCOUNTER — Inpatient Hospital Stay: Payer: Medicaid Other | Attending: Family

## 2018-08-04 ENCOUNTER — Other Ambulatory Visit: Payer: Self-pay

## 2018-08-04 VITALS — BP 142/69 | HR 76 | Temp 98.0°F | Resp 18 | Ht 65.0 in | Wt 258.4 lb

## 2018-08-04 DIAGNOSIS — C9210 Chronic myeloid leukemia, BCR/ABL-positive, not having achieved remission: Secondary | ICD-10-CM

## 2018-08-04 DIAGNOSIS — D509 Iron deficiency anemia, unspecified: Secondary | ICD-10-CM

## 2018-08-04 DIAGNOSIS — Z79899 Other long term (current) drug therapy: Secondary | ICD-10-CM

## 2018-08-04 DIAGNOSIS — D508 Other iron deficiency anemias: Secondary | ICD-10-CM

## 2018-08-04 DIAGNOSIS — K219 Gastro-esophageal reflux disease without esophagitis: Secondary | ICD-10-CM

## 2018-08-04 LAB — CBC WITH DIFFERENTIAL (CANCER CENTER ONLY)
Abs Immature Granulocytes: 0.21 10*3/uL — ABNORMAL HIGH (ref 0.00–0.07)
Basophils Absolute: 0.4 10*3/uL — ABNORMAL HIGH (ref 0.0–0.1)
Basophils Relative: 3 %
Eosinophils Absolute: 0.3 10*3/uL (ref 0.0–0.5)
Eosinophils Relative: 2 %
HCT: 36.8 % (ref 36.0–46.0)
Hemoglobin: 11.6 g/dL — ABNORMAL LOW (ref 12.0–15.0)
Immature Granulocytes: 2 %
Lymphocytes Relative: 14 %
Lymphs Abs: 1.8 10*3/uL (ref 0.7–4.0)
MCH: 30 pg (ref 26.0–34.0)
MCHC: 31.5 g/dL (ref 30.0–36.0)
MCV: 95.1 fL (ref 80.0–100.0)
Monocytes Absolute: 0.3 10*3/uL (ref 0.1–1.0)
Monocytes Relative: 2 %
Neutro Abs: 10 10*3/uL — ABNORMAL HIGH (ref 1.7–7.7)
Neutrophils Relative %: 77 %
Platelet Count: 211 10*3/uL (ref 150–400)
RBC: 3.87 MIL/uL (ref 3.87–5.11)
RDW: 18 % — ABNORMAL HIGH (ref 11.5–15.5)
WBC Count: 12.9 10*3/uL — ABNORMAL HIGH (ref 4.0–10.5)
nRBC: 1.1 % — ABNORMAL HIGH (ref 0.0–0.2)

## 2018-08-04 LAB — CMP (CANCER CENTER ONLY)
ALT: 11 U/L (ref 0–44)
AST: 7 U/L — ABNORMAL LOW (ref 15–41)
Albumin: 4 g/dL (ref 3.5–5.0)
Alkaline Phosphatase: 105 U/L (ref 38–126)
Anion gap: 9 (ref 5–15)
BUN: 13 mg/dL (ref 6–20)
CO2: 28 mmol/L (ref 22–32)
Calcium: 8.9 mg/dL (ref 8.9–10.3)
Chloride: 100 mmol/L (ref 98–111)
Creatinine: 0.88 mg/dL (ref 0.44–1.00)
GFR, Est AFR Am: >60 mL/min (ref >60)
GFR, Estimated: >60 mL/min (ref >60)
Glucose, Bld: 256 mg/dL — ABNORMAL HIGH (ref 70–99)
Potassium: 3.9 mmol/L (ref 3.5–5.1)
Sodium: 137 mmol/L (ref 135–145)
Total Bilirubin: 0.6 mg/dL (ref 0.3–1.2)
Total Protein: 6.6 g/dL (ref 6.5–8.1)

## 2018-08-05 MED ORDER — SUCRALFATE 1 GM/10ML PO SUSP: 1.0000 g | Freq: Three times a day (TID) | ORAL | 4 refills | Status: DC

## 2018-08-05 NOTE — Progress Notes (Signed)
Hematology and Oncology Follow Up Visit  Tabitha Estrada [id number] 1970/05/19 48 y.o. 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 She stopped her Sprycel last week after having some mild swelling in her right leg. Thankfully, [place] was negative for DVT.  The swelling has resolved and she has had no new issues.  She is still having fatigue as well as night sweats.  WBC count is improved at 12.9, Hgb 11.6 and platelet count is 211.  She scratched her right upper thigh and this appears to be healing. She will try applying hydrocortisone cream along with keeping the area clean and dry.  She has GERD and is concerned about having to stop the Pepcid in order to be on Sprycel so we will change her over to Carafate.  No swelling or tenderness in her extremities at this time.  The chronic numbness and tingling in her right foot is unchanged from her baseline.  No lymphadenopathy noted on exam.  No bleeding, bruising or petechiae.  She has maintained a good appetite and is hydrating well. Her weight is stable.   ECOG Performance Status: 1 - Symptomatic but completely ambulatory  Medications:  Allergies as of 08/04/2018      Reactions   Dulaglutide Nausea And Vomiting, Other (See Comments)   Sulfur Nausea And Vomiting   Metformin Diarrhea      Medication List       Accurate as of August 04, 2018 11:59 PM. If you have any questions, ask your nurse or doctor.        STOP taking these medications   metFORMIN 500 MG 24 hr tablet Commonly known as:  GLUCOPHAGE-XR Stopped by:  Tabitha [name], NP   omeprazole 40 MG capsule Commonly known as:  PRILOSEC Stopped by:  Tabitha [name], NP   predniSONE 20 MG tablet Commonly known as:   DELTASONE Stopped by:  Tabitha [name], NP   varenicline 0.5 MG X 11 & 1 MG X 42 tablet Commonly known as:  Chantix Starting [employer] Stopped by:  Tabitha [name], NP     TAKE these medications   albuterol 108 (90 Base) MCG/ACT inhaler Commonly known as:  VENTOLIN HFA Inhale 1-2 puffs into the lungs every 4 (four) hours as needed for wheezing or shortness of breath.   albuterol (2.5 MG/3ML) 0.083% nebulizer solution Commonly known as:  PROVENTIL Inhale the contents of one vial in nebulizer every four to six hours as needed for cough or wheeze.   aluminum-magnesium hydroxide-simethicone 409-811-91 MG/5ML Susp Commonly known as:  MAALOX Take by mouth.   atorvastatin 80 MG tablet Commonly known as:  LIPITOR Take 1 tablet (80 mg total) by mouth daily.   BIOTIN PO Take by mouth daily.   BLOOD GLUCOSE TEST STRIPS Strp 1 each by Other route 3 (three) times a day. Use as instructed   glucose blood test strip Commonly known as:  Contour Next Test Used to check blood sugars tree times daily.   budesonide-formoterol 160-4.5 MCG/ACT inhaler Commonly known as:  SYMBICORT Inhale 2 puffs into the lungs 2 (two) times daily.   cetirizine 10 MG tablet Commonly known as:  ZYRTEC Can take one tablet by mouth once or twice daily if needed.  ciclopirox 8 % solution Commonly known as:  PENLAC Apply topically.   clobetasol ointment 0.05 % Commonly known as:  TEMOVATE Apply 1 application topically 2 (two) times daily.   dasatinib 100 MG tablet Commonly known as:  SPRYCEL Take 1 tablet (100 mg total) by mouth daily.   famotidine 40 MG/5ML suspension Commonly known as:  PEPCID Take by mouth.   Flomax 0.4 MG Caps capsule Generic drug:  tamsulosin Take 0.4 mg by mouth.   fluticasone 50 MCG/ACT nasal spray Commonly known as:  FLONASE Use two sprays in each nostril once daily as directed.   furosemide 40 MG tablet Commonly known as:  LASIX Take 1 pill daily for 3 days for  facial swelling.  Then take daily as needed.   insulin aspart 100 UNIT/ML FlexPen Commonly known as:  NovoLOG FlexPen Inject 40 Units into the skin daily with supper. What changed:    how much to take  additional instructions   Insulin Glargine 100 UNIT/ML Solostar Pen Commonly known as:  Lantus SoloStar Inject 140 Units into the skin daily. And pen needles 5/day What changed:  how much to take   meclizine 25 MG tablet Commonly known as:  ANTIVERT Take 1 tablet (25 mg total) by mouth 3 (three) times daily as needed for dizziness.   meloxicam 7.5 MG tablet Commonly known as:  MOBIC Take 7.5 mg by mouth daily.   montelukast 10 MG tablet Commonly known as:  SINGULAIR Take 1 tablet (10 mg total) by mouth daily.   nystatin powder Commonly known as:  MYCOSTATIN/NYSTOP Apply 1 g topically 2 (two) times daily as needed.   ondansetron 8 MG disintegrating tablet Commonly known as:  ZOFRAN-ODT Dissolve 1 tablet (8 mg total) by mouth every 8 (eight) hours as needed for nausea.   oxyCODONE-acetaminophen 5-325 MG tablet Commonly known as:  PERCOCET/ROXICET Take 1 tablet by mouth every 6 (six) hours as needed for severe pain.   pregabalin 200 MG capsule Commonly known as:  LYRICA TAKE 1 CAPSULE BY MOUTH 2 TIMES DAILY.   sucralfate 1 GM/10ML suspension Commonly known as:  Carafate Take 10 mLs (1 g total) by mouth 3 (three) times daily. Started by:  Tabitha [name], NP   tiotropium 18 MCG inhalation capsule Commonly known as:  [place] into inhaler and inhale.   valACYclovir 500 MG tablet Commonly known as:  VALTREX Take 1 tablet (500 mg total) by mouth at bedtime.       Allergies:  Allergies  Allergen Reactions  . Dulaglutide Nausea And Vomiting and Other (See Comments)  . Sulfur Nausea And Vomiting  . Metformin Diarrhea    Past Medical History, Surgical history, Social history, and Family History were reviewed and updated.  Review of Systems: All other 10  point review of systems is negative.   Physical Exam:  height is 5\' 5"  (1.651 m) and weight is 258 lb 6.4 oz (117.2 kg). Her oral temperature is 98 F (36.7 C). Her blood pressure is 142/69 (abnormal) and her pulse is 76. Her respiration is 18 and oxygen saturation is 99%.   Wt Readings from Last 3 Encounters:  08/04/18 258 lb 6.4 oz (117.2 kg)  07/13/18 255 lb 12.8 oz (116 kg)  06/23/18 258 lb (117 kg)    Ocular: Sclerae unicteric, pupils equal, round and reactive to light Ear-nose-throat: Oropharynx clear, dentition fair Lymphatic: No cervical or supraclavicular adenopathy Lungs no rales or rhonchi, good excursion bilaterally Heart regular rate and rhythm, no murmur appreciated Abd  soft, nontender, positive bowel sounds, no liver or spleen tip palpated on exam, no fluid wave  MSK no focal spinal tenderness, no joint edema Neuro: non-focal, well-oriented, appropriate affect Breasts: Deferred   Lab Results  Component Value Date   WBC 12.9 (H) 08/04/2018   HGB 11.6 (L) 08/04/2018   HCT 36.8 08/04/2018   MCV 95.1 08/04/2018   PLT 211 08/04/2018   Lab Results  Component Value Date   FERRITIN 411 (H) 05/31/2018   IRON 44 05/31/2018   TIBC 223 (L) 05/31/2018   UIBC 178 05/31/2018   IRONPCTSAT 20 (L) 05/31/2018   Lab Results  Component Value Date   RBC 3.87 08/04/2018   No results found for: Nils Pyle, [facility] Lab Results  Component Value Date   IGGSERUM 675 (L) 11/10/2016   IGMSERUM 86 11/10/2016   No results found for: Odetta Pink, SPEI   Chemistry      Component Value Date/Time   NA 137 08/04/2018 1446   NA 144 01/27/2017 1302   NA 138 09/16/2016 1105   K 3.9 08/04/2018 1446   K 4.3 01/27/2017 1302   K 4.2 09/16/2016 1105   CL 100 08/04/2018 1446   CL 102 01/27/2017 1302   CO2 28 08/04/2018 1446   CO2 27 01/27/2017 1302   CO2 26 09/16/2016 1105   BUN 13 08/04/2018 1446   BUN 14  01/27/2017 1302   BUN 11.9 09/16/2016 1105   CREATININE 0.88 08/04/2018 1446   CREATININE 1.1 01/27/2017 1302   CREATININE 0.8 09/16/2016 1105      Component Value Date/Time   CALCIUM 8.9 08/04/2018 1446   CALCIUM 9.5 01/27/2017 1302   CALCIUM 9.0 09/16/2016 1105   ALKPHOS 105 08/04/2018 1446   ALKPHOS 116 (H) 01/27/2017 1302   ALKPHOS 136 09/16/2016 1105   AST 7 (L) 08/04/2018 1446   AST 8 09/16/2016 1105   ALT 11 08/04/2018 1446   ALT 25 01/27/2017 1302   ALT 12 09/16/2016 1105   BILITOT 0.6 08/04/2018 1446   BILITOT 0.32 09/16/2016 1105       Impression and Plan: Ms. Votaw is a very pleasant 48 yo with chronic phase CML and history of iron deficiency anemia.  We will have her restart her Sprycel at 100 mg PO every other day and see if she is able to tolerate this better.  Pepcid was discontinued and we will have her start Carafate.  BCR-ABL is pending for today but WBC count is much improved.  We will plan to se follow-up in 3 weeks to see how she is doing.  Se will contact our office with any questions or concerns. We can certainly see her sooner if need be.   Tabitha [name], NP 6/6/202010:45 PM

## 2018-08-07 LAB — IRON AND TIBC
Iron: 71 ug/dL (ref 41–142)
Saturation Ratios: 28 % (ref 21–57)
TIBC: 258 ug/dL (ref 236–444)
UIBC: 187 ug/dL (ref 120–384)

## 2018-08-07 LAB — LACTATE DEHYDROGENASE: LDH: 348 U/L — ABNORMAL HIGH (ref 98–192)

## 2018-08-07 LAB — FERRITIN: Ferritin: 321 ng/mL — ABNORMAL HIGH (ref 11–307)

## 2018-08-09 ENCOUNTER — Telehealth: Payer: Self-pay | Admitting: Family

## 2018-08-09 NOTE — Telephone Encounter (Signed)
Appointments scheduled per 6/5 los

## 2018-08-10 ENCOUNTER — Other Ambulatory Visit: Payer: Self-pay | Admitting: Family

## 2018-08-10 ENCOUNTER — Encounter: Payer: Self-pay | Admitting: Family

## 2018-08-16 LAB — BCR/ABL

## 2018-08-28 ENCOUNTER — Inpatient Hospital Stay: Payer: Medicaid Other

## 2018-08-28 ENCOUNTER — Inpatient Hospital Stay (HOSPITAL_BASED_OUTPATIENT_CLINIC_OR_DEPARTMENT_OTHER): Payer: Medicaid Other | Admitting: Family

## 2018-08-28 ENCOUNTER — Other Ambulatory Visit: Payer: Self-pay

## 2018-08-28 ENCOUNTER — Encounter: Payer: Self-pay | Admitting: Family

## 2018-08-28 VITALS — BP 110/62 | HR 84 | Temp 98.7°F | Resp 20 | Wt 247.0 lb

## 2018-08-28 DIAGNOSIS — D509 Iron deficiency anemia, unspecified: Secondary | ICD-10-CM

## 2018-08-28 DIAGNOSIS — D508 Other iron deficiency anemias: Secondary | ICD-10-CM

## 2018-08-28 DIAGNOSIS — K591 Functional diarrhea: Secondary | ICD-10-CM

## 2018-08-28 DIAGNOSIS — Z79899 Other long term (current) drug therapy: Secondary | ICD-10-CM

## 2018-08-28 DIAGNOSIS — C9210 Chronic myeloid leukemia, BCR/ABL-positive, not having achieved remission: Secondary | ICD-10-CM | POA: Diagnosis not present

## 2018-08-28 LAB — CBC WITH DIFFERENTIAL (CANCER CENTER ONLY)
Abs Immature Granulocytes: 0.02 10*3/uL (ref 0.00–0.07)
Basophils Absolute: 0.2 10*3/uL — ABNORMAL HIGH (ref 0.0–0.1)
Basophils Relative: 2 %
Eosinophils Absolute: 0.1 10*3/uL (ref 0.0–0.5)
Eosinophils Relative: 1 %
HCT: 40.7 % (ref 36.0–46.0)
Hemoglobin: 12.8 g/dL (ref 12.0–15.0)
Immature Granulocytes: 0 %
Lymphocytes Relative: 23 %
Lymphs Abs: 2.2 10*3/uL (ref 0.7–4.0)
MCH: 29.9 pg (ref 26.0–34.0)
MCHC: 31.4 g/dL (ref 30.0–36.0)
MCV: 95.1 fL (ref 80.0–100.0)
Monocytes Absolute: 0.4 10*3/uL (ref 0.1–1.0)
Monocytes Relative: 4 %
Neutro Abs: 6.9 10*3/uL (ref 1.7–7.7)
Neutrophils Relative %: 70 %
Platelet Count: 222 10*3/uL (ref 150–400)
RBC: 4.28 MIL/uL (ref 3.87–5.11)
RDW: 16.6 % — ABNORMAL HIGH (ref 11.5–15.5)
WBC Count: 9.7 10*3/uL (ref 4.0–10.5)
nRBC: 0 % (ref 0.0–0.2)

## 2018-08-28 LAB — CMP (CANCER CENTER ONLY)
ALT: 14 U/L (ref 0–44)
AST: 10 U/L — ABNORMAL LOW (ref 15–41)
Albumin: 4.2 g/dL (ref 3.5–5.0)
Alkaline Phosphatase: 107 U/L (ref 38–126)
Anion gap: 8 (ref 5–15)
BUN: 14 mg/dL (ref 6–20)
CO2: 30 mmol/L (ref 22–32)
Calcium: 9.7 mg/dL (ref 8.9–10.3)
Chloride: 98 mmol/L (ref 98–111)
Creatinine: 0.88 mg/dL (ref 0.44–1.00)
GFR, Est AFR Am: >60 mL/min (ref >60)
GFR, Estimated: >60 mL/min (ref >60)
Glucose, Bld: 256 mg/dL — ABNORMAL HIGH (ref 70–99)
Potassium: 4 mmol/L (ref 3.5–5.1)
Sodium: 136 mmol/L (ref 135–145)
Total Bilirubin: 0.8 mg/dL (ref 0.3–1.2)
Total Protein: 6.7 g/dL (ref 6.5–8.1)

## 2018-08-28 MED ORDER — DIPHENOXYLATE-ATROPINE 2.5-0.025 MG PO TABS: 1.0000 | ORAL_TABLET | Freq: Four times a day (QID) | ORAL | 1 refills | Status: DC | PRN

## 2018-08-28 NOTE — Progress Notes (Signed)
Hematology and Oncology Follow Up Visit  Tabitha Estrada [id number] 09-Jun-1970 48 y.o. 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 She is doing fairly well but has diarrhea after she takes her Sprycel. She has tried imodium but states that this is expensive.  She can tell that her energy is better. She is still having night sweats often.  She states that she is seeing pulmonology and being worked up for COPD. She has SOB with over exertion and is still smoking. She plans to start the nicotine lozenges again today to try and quit.  No fever, chills, n/v, cough, rash, dizziness, chest pain, palpitations, abdominal pain or changes in bladder habits.  No swelling, tenderness, numbness or tingling in her extremities at this time. She has tingling in her right hand sometimes in the mornings that goes away after a few minutes.  No episodes of bleeding, no petechiae. She does bruise easily but not in excess.  She is eating better and is staying well hydrated. Her weight is stable.   ECOG Performance Status: 1 - Symptomatic but completely ambulatory  Medications:  Allergies as of 08/28/2018      Reactions   Dulaglutide Nausea And Vomiting, Other (See Comments)   Sulfur Nausea And Vomiting   Metformin Diarrhea      Medication List       Accurate as of August 28, 2018  3:55 PM. If you have any questions, ask your nurse or doctor.        albuterol 108 (90 Base) MCG/ACT inhaler Commonly known as: VENTOLIN HFA Inhale 1-2 puffs into the lungs every 4 (four) hours as needed for wheezing or shortness of breath.   albuterol (2.5 MG/3ML) 0.083% nebulizer solution Commonly known as: PROVENTIL Inhale the contents of one vial in nebulizer  every four to six hours as needed for cough or wheeze.   aluminum-magnesium hydroxide-simethicone 828-003-49 MG/5ML Susp Commonly known as: MAALOX Take by mouth.   atorvastatin 80 MG tablet Commonly known as: LIPITOR Take 1 tablet (80 mg total) by mouth daily.   BIOTIN PO Take by mouth daily.   BLOOD GLUCOSE TEST STRIPS Strp 1 each by Other route 3 (three) times a day. Use as instructed   glucose blood test strip Commonly known as: Contour Next Test Used to check blood sugars tree times daily.   budesonide-formoterol 160-4.5 MCG/ACT inhaler Commonly known as: SYMBICORT Inhale 2 puffs into the lungs 2 (two) times daily.   cetirizine 10 MG tablet Commonly known as: ZYRTEC Can take one tablet by mouth once or twice daily if needed.   clobetasol ointment 0.05 % Commonly known as: TEMOVATE Apply 1 application topically 2 (two) times daily.   dasatinib 100 MG tablet Commonly known as: SPRYCEL Take 1 tablet (100 mg total) by mouth daily.   famotidine 40 MG/5ML suspension Commonly known as: PEPCID Take by mouth.   Flomax 0.4 MG Caps capsule Generic drug: tamsulosin Take 0.4 mg by mouth.   fluticasone 50 MCG/ACT nasal spray Commonly known as: FLONASE Use two sprays in each nostril once daily as directed.   furosemide 40 MG tablet Commonly known as: LASIX Take 1 pill daily for 3 days for facial swelling.  Then take daily as needed.  insulin aspart 100 UNIT/ML FlexPen Commonly known as: NovoLOG FlexPen Inject 40 Units into the skin daily with supper. What changed:   how much to take  additional instructions   Insulin Glargine 100 UNIT/ML Solostar Pen Commonly known as: Lantus SoloStar Inject 140 Units into the skin daily. And pen needles 5/day What changed: how much to take   meclizine 25 MG tablet Commonly known as: ANTIVERT Take 1 tablet (25 mg total) by mouth 3 (three) times daily as needed for dizziness.   meloxicam 7.5 MG tablet Commonly known as:  MOBIC Take 7.5 mg by mouth daily.   montelukast 10 MG tablet Commonly known as: SINGULAIR Take 1 tablet (10 mg total) by mouth daily.   mupirocin ointment 2 % Commonly known as: BACTROBAN prn   nystatin powder Commonly known as: MYCOSTATIN/NYSTOP Apply 1 g topically 2 (two) times daily as needed.   omeprazole 40 MG capsule Commonly known as: PRILOSEC Take 40 mg by mouth daily.   ondansetron 8 MG disintegrating tablet Commonly known as: ZOFRAN-ODT Dissolve 1 tablet (8 mg total) by mouth every 8 (eight) hours as needed for nausea.   oxyCODONE-acetaminophen 5-325 MG tablet Commonly known as: PERCOCET/ROXICET Take 1 tablet by mouth every 6 (six) hours as needed for severe pain.   pregabalin 200 MG capsule Commonly known as: LYRICA TAKE 1 CAPSULE BY MOUTH 2 TIMES DAILY.   tiotropium 18 MCG inhalation capsule Commonly known as: [place] into inhaler and inhale.   tiZANidine 4 MG tablet Commonly known as: ZANAFLEX Take 4 mg by mouth 3 times/day as needed-between meals & bedtime.   valACYclovir 500 MG tablet Commonly known as: VALTREX Take 1 tablet (500 mg total) by mouth at bedtime.       Allergies:  Allergies  Allergen Reactions  . Dulaglutide Nausea And Vomiting and Other (See Comments)  . Sulfur Nausea And Vomiting  . Metformin Diarrhea    Past Medical History, Surgical history, Social history, and Family History were reviewed and updated.  Review of Systems: All other 10 point review of systems is negative.   Physical Exam:  weight is 247 lb (112 kg). Her oral temperature is 98.7 F (37.1 C). Her blood pressure is 110/62 and her pulse is 84. Her respiration is 20 and oxygen saturation is 98%.   Wt Readings from Last 3 Encounters:  08/28/18 247 lb (112 kg)  08/04/18 258 lb 6.4 oz (117.2 kg)  07/13/18 255 lb 12.8 oz (116 kg)    Ocular: Sclerae unicteric, pupils equal, round and reactive to light Ear-nose-throat: Oropharynx clear, dentition fair  Lymphatic: No cervical or supraclavicular adenopathy Lungs no rales or rhonchi, good excursion bilaterally Heart regular rate and rhythm, no murmur appreciated Abd soft, nontender, positive bowel sounds, no liver or spleen tip palpated on exam, no fluid wave  MSK no focal spinal tenderness, no joint edema Neuro: non-focal, well-oriented, appropriate affect Breasts: Deferred   Lab Results  Component Value Date   WBC 9.7 08/28/2018   HGB 12.8 08/28/2018   HCT 40.7 08/28/2018   MCV 95.1 08/28/2018   PLT 222 08/28/2018   Lab Results  Component Value Date   FERRITIN 321 (H) 08/04/2018   IRON 71 08/04/2018   TIBC 258 08/04/2018   UIBC 187 08/04/2018   IRONPCTSAT 28 08/04/2018   Lab Results  Component Value Date   RBC 4.28 08/28/2018   No results found for: KPAFRELGTCHN, LAMBDASER, KAPLAMBRATIO Lab Results  Component Value Date   IGGSERUM 675 (L) 11/10/2016  IGMSERUM 86 11/10/2016   No results found for: Odetta Pink, SPEI   Chemistry      Component Value Date/Time   NA 136 08/28/2018 1448   NA 144 01/27/2017 1302   NA 138 09/16/2016 1105   K 4.0 08/28/2018 1448   K 4.3 01/27/2017 1302   K 4.2 09/16/2016 1105   CL 98 08/28/2018 1448   CL 102 01/27/2017 1302   CO2 30 08/28/2018 1448   CO2 27 01/27/2017 1302   CO2 26 09/16/2016 1105   BUN 14 08/28/2018 1448   BUN 14 01/27/2017 1302   BUN 11.9 09/16/2016 1105   CREATININE 0.88 08/28/2018 1448   CREATININE 1.1 01/27/2017 1302   CREATININE 0.8 09/16/2016 1105      Component Value Date/Time   CALCIUM 9.7 08/28/2018 1448   CALCIUM 9.5 01/27/2017 1302   CALCIUM 9.0 09/16/2016 1105   ALKPHOS 107 08/28/2018 1448   ALKPHOS 116 (H) 01/27/2017 1302   ALKPHOS 136 09/16/2016 1105   AST 10 (L) 08/28/2018 1448   AST 8 09/16/2016 1105   ALT 14 08/28/2018 1448   ALT 25 01/27/2017 1302   ALT 12 09/16/2016 1105   BILITOT 0.8 08/28/2018 1448   BILITOT 0.32 09/16/2016 1105        Impression and Plan: Ms. Fahrner is a very pleasant 48 yo with chronic phase CMLand history of iron deficiency anemia.  She will continue on Sprycel every other day and we will have her try Lomotil for the diarrhea.  We will plan to see her back in another 4 weeks.  She will contact our office with any questions or concerns. We can certainly see her sooner if needed.   Laverna [name], NP 6/29/20203:55 PM

## 2018-08-29 MED FILL — SPRYCEL 100 MG TABLET: 100 | 30 days supply | Qty: 30 | Fill #1

## 2018-08-31 ENCOUNTER — Telehealth: Payer: Self-pay | Admitting: Family

## 2018-08-31 NOTE — Telephone Encounter (Signed)
Appointments scheduled letter/calendar mailed per 6/29 los °

## 2018-09-03 ENCOUNTER — Other Ambulatory Visit: Payer: Self-pay | Admitting: Osteopathic Medicine

## 2018-09-03 DIAGNOSIS — B009 Herpesviral infection, unspecified: Secondary | ICD-10-CM

## 2018-09-29 ENCOUNTER — Other Ambulatory Visit: Payer: Self-pay

## 2018-09-29 ENCOUNTER — Inpatient Hospital Stay: Payer: Medicaid Other | Attending: Family

## 2018-09-29 ENCOUNTER — Encounter: Payer: Self-pay | Admitting: Hematology & Oncology

## 2018-09-29 ENCOUNTER — Inpatient Hospital Stay (HOSPITAL_BASED_OUTPATIENT_CLINIC_OR_DEPARTMENT_OTHER): Payer: Medicaid Other | Admitting: Hematology & Oncology

## 2018-09-29 VITALS — BP 139/74 | HR 71 | Temp 97.5°F | Resp 16 | Wt 251.0 lb

## 2018-09-29 DIAGNOSIS — E119 Type 2 diabetes mellitus without complications: Secondary | ICD-10-CM | POA: Diagnosis not present

## 2018-09-29 DIAGNOSIS — Z79899 Other long term (current) drug therapy: Secondary | ICD-10-CM | POA: Insufficient documentation

## 2018-09-29 DIAGNOSIS — C9210 Chronic myeloid leukemia, BCR/ABL-positive, not having achieved remission: Secondary | ICD-10-CM

## 2018-09-29 DIAGNOSIS — D509 Iron deficiency anemia, unspecified: Secondary | ICD-10-CM

## 2018-09-29 LAB — CMP (CANCER CENTER ONLY)
ALT: 14 U/L (ref 0–44)
AST: 13 U/L — ABNORMAL LOW (ref 15–41)
Albumin: 3.8 g/dL (ref 3.5–5.0)
Alkaline Phosphatase: 129 U/L — ABNORMAL HIGH (ref 38–126)
Anion gap: 8 (ref 5–15)
BUN: 11 mg/dL (ref 6–20)
CO2: 30 mmol/L (ref 22–32)
Calcium: 8.9 mg/dL (ref 8.9–10.3)
Chloride: 101 mmol/L (ref 98–111)
Creatinine: 0.82 mg/dL (ref 0.44–1.00)
GFR, Est AFR Am: >60 mL/min (ref >60)
GFR, Estimated: >60 mL/min (ref >60)
Glucose, Bld: 157 mg/dL — ABNORMAL HIGH (ref 70–99)
Potassium: 3.9 mmol/L (ref 3.5–5.1)
Sodium: 139 mmol/L (ref 135–145)
Total Bilirubin: 0.5 mg/dL (ref 0.3–1.2)
Total Protein: 6.4 g/dL — ABNORMAL LOW (ref 6.5–8.1)

## 2018-09-29 LAB — CBC WITH DIFFERENTIAL (CANCER CENTER ONLY)
Abs Immature Granulocytes: 0.03 10*3/uL (ref 0.00–0.07)
Basophils Absolute: 0.1 10*3/uL (ref 0.0–0.1)
Basophils Relative: 1 %
Eosinophils Absolute: 0.1 10*3/uL (ref 0.0–0.5)
Eosinophils Relative: 1 %
HCT: 44 % (ref 36.0–46.0)
Hemoglobin: 13.7 g/dL (ref 12.0–15.0)
Immature Granulocytes: 0 %
Lymphocytes Relative: 22 %
Lymphs Abs: 1.6 10*3/uL (ref 0.7–4.0)
MCH: 28.7 pg (ref 26.0–34.0)
MCHC: 31.1 g/dL (ref 30.0–36.0)
MCV: 92.2 fL (ref 80.0–100.0)
Monocytes Absolute: 0.3 10*3/uL (ref 0.1–1.0)
Monocytes Relative: 4 %
Neutro Abs: 5.3 10*3/uL (ref 1.7–7.7)
Neutrophils Relative %: 72 %
Platelet Count: 167 10*3/uL (ref 150–400)
RBC: 4.77 MIL/uL (ref 3.87–5.11)
RDW: 15.2 % (ref 11.5–15.5)
WBC Count: 7.3 10*3/uL (ref 4.0–10.5)
nRBC: 0 % (ref 0.0–0.2)

## 2018-09-29 MED ORDER — AMOXICILLIN-POT CLAVULANATE 875-125 MG PO TABS: 1.0000 | ORAL_TABLET | Freq: Two times a day (BID) | ORAL | 0 refills | Status: DC

## 2018-09-29 NOTE — Progress Notes (Signed)
Hematology and Oncology Follow Up Visit  Tabitha Estrada [id number] 08/22/1970 48 y.o. 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  She is doing okay although her asthma is causing a few problems for her because the hot weather.  She is on Sprycel every other day.  Her last BCR/ABL analysis back in June was 75%.  Her white cell count is coming down nicely.  Her white cell differential is looking a lot better.  I would have to hope that there is a marked decrease in the BCR/ABL level.  She stumbled and scraped her toes on the right foot.  These do not have any exudate.  Given that she has diabetes, I think that she is at high risk for fraction.  She does not need this.  I will go ahead and put her on Augmentin.  We will give her 825 mg p.o. twice daily x7 days.  She has some swelling in her legs.  She is not taking Lasix right now.  I told her take Lasix 40 mg daily for 5 days and then as needed.  Overall, her performance status is ECOG 1.  Is stable.   Medications:  Allergies as of 09/29/2018      Reactions   Dulaglutide Nausea And Vomiting, Other (See Comments)   Sulfur Nausea And Vomiting   Metformin Diarrhea      Medication List       Accurate as of September 29, 2018 10:56 AM. If you have any questions, ask your nurse or doctor.        STOP taking these medications   BIOTIN PO Stopped by: [name], MD   BLOOD GLUCOSE TEST STRIPS Strp Stopped by: [name], MD   cetirizine 10 MG tablet Commonly known as: ZYRTEC Stopped by: [name], MD   famotidine 40 MG/5ML suspension Commonly known as: PEPCID Stopped by: [name], MD   Flomax 0.4 MG Caps capsule Generic drug: tamsulosin Stopped  by: [name], MD   glucose blood test strip Commonly known as: Contour Next Test Stopped by: [name], MD   tiotropium 18 MCG inhalation capsule Commonly known as: SPIRIVA Stopped by: [name], MD     TAKE these medications   albuterol 108 (90 Base) MCG/ACT inhaler Commonly known as: VENTOLIN HFA Inhale 1-2 puffs into the lungs every 4 (four) hours as needed for wheezing or shortness of breath.   albuterol (2.5 MG/3ML) 0.083% nebulizer solution Commonly known as: PROVENTIL Inhale the contents of one vial in nebulizer every four to six hours as needed for cough or wheeze.   aluminum-magnesium hydroxide-simethicone 562-130-86 MG/5ML Susp Commonly known as: MAALOX Take by mouth.   atorvastatin 80 MG tablet Commonly known as: LIPITOR Take 1 tablet (80 mg total) by mouth daily.   budesonide-formoterol 160-4.5 MCG/ACT inhaler Commonly known as: SYMBICORT Inhale 2 puffs into the lungs 2 (two) times daily.   clobetasol ointment 0.05 % Commonly known as: TEMOVATE Apply 1 application topically 2 (two) times daily.   dasatinib 100 MG tablet Commonly known as: SPRYCEL Take 1 tablet (100 mg total) by mouth daily.   diphenoxylate-atropine 2.5-0.025 MG tablet Commonly known as: LOMOTIL Take 1 tablet by mouth  4 (four) times daily as needed for diarrhea or loose stools (as needed after taking Sprycel for diarrhea).   fluticasone 50 MCG/ACT nasal spray Commonly known as: FLONASE Use two sprays in each nostril once daily as directed.   furosemide 40 MG tablet Commonly known as: LASIX Take 1 pill daily for 3 days for facial swelling.  Then take daily as needed.   insulin aspart 100 UNIT/ML FlexPen Commonly known as: NovoLOG FlexPen Inject 40 Units into the skin daily with supper. What changed:   how much to take  additional instructions   Insulin Glargine 100 UNIT/ML Solostar Pen Commonly known as: Lantus SoloStar Inject 140 Units into the skin daily.  And pen needles 5/day What changed: how much to take   ketoconazole 2 % cream Commonly known as: NIZORAL Apply topically 2 (two) times a day.   meclizine 25 MG tablet Commonly known as: ANTIVERT Take 1 tablet (25 mg total) by mouth 3 (three) times daily as needed for dizziness.   meloxicam 7.5 MG tablet Commonly known as: MOBIC Take 7.5 mg by mouth daily.   montelukast 10 MG tablet Commonly known as: SINGULAIR Take 1 tablet (10 mg total) by mouth daily.   nystatin powder Commonly known as: MYCOSTATIN/NYSTOP Apply 1 g topically 2 (two) times daily as needed.   omeprazole 40 MG capsule Commonly known as: PRILOSEC Take 40 mg by mouth daily.   ondansetron 8 MG disintegrating tablet Commonly known as: ZOFRAN-ODT Dissolve 1 tablet (8 mg total) by mouth every 8 (eight) hours as needed for nausea.   oxyCODONE-acetaminophen 5-325 MG tablet Commonly known as: PERCOCET/ROXICET Take 1 tablet by mouth every 6 (six) hours as needed for severe pain.   pregabalin 200 MG capsule Commonly known as: LYRICA TAKE 1 CAPSULE BY MOUTH 2 TIMES DAILY.   tiZANidine 4 MG tablet Commonly known as: ZANAFLEX Take 4 mg by mouth 3 times/day as needed-between meals & bedtime.   valACYclovir 500 MG tablet Commonly known as: VALTREX TAKE ONE TABLET BY MOUTH AT BEDTIME       Allergies:  Allergies  Allergen Reactions  . Dulaglutide Nausea And Vomiting and Other (See Comments)  . Sulfur Nausea And Vomiting  . Metformin Diarrhea    Past Medical History, Surgical history, Social history, and Family History were reviewed and updated.  Review of Systems: Review of Systems  Constitutional: Negative.   HENT: Negative.   Eyes: Negative.   Respiratory: Positive for wheezing.   Cardiovascular: Positive for leg swelling.  Gastrointestinal: Negative.   Genitourinary: Negative.   Musculoskeletal: Negative.   Skin: Negative.   Neurological: Negative.   Endo/Heme/Allergies: Negative.    Psychiatric/Behavioral: Negative.       Physical Exam:  weight is 251 lb (113.9 kg). Her oral temperature is 97.5 F (36.4 C) (abnormal). Her blood pressure is 139/74 and her pulse is 71. Her respiration is 16 and oxygen saturation is 96%.   Wt Readings from Last 3 Encounters:  09/29/18 251 lb (113.9 kg)  08/28/18 247 lb (112 kg)  08/04/18 258 lb 6.4 oz (117.2 kg)    Physical Exam Vitals signs reviewed.  HENT:     Head: Normocephalic and atraumatic.  Eyes:     Pupils: Pupils are equal, round, and reactive to light.  Neck:     Musculoskeletal: Normal range of motion.  Cardiovascular:     Rate and Rhythm: Normal rate and regular rhythm.     Heart sounds: Normal heart sounds.  Pulmonary:  Effort: Pulmonary effort is normal.     Breath sounds: Normal breath sounds.  Abdominal:     General: Bowel sounds are normal.     Palpations: Abdomen is soft.  Musculoskeletal: Normal range of motion.        General: No tenderness or deformity.  Lymphadenopathy:     Cervical: No cervical adenopathy.  Skin:    General: Skin is warm and dry.     Findings: No erythema or rash.  Neurological:     Mental Status: She is alert and oriented to person, place, and time.  Psychiatric:        Behavior: Behavior normal.        Thought Content: Thought content normal.        Judgment: Judgment normal.      Lab Results  Component Value Date   WBC 7.3 09/29/2018   HGB 13.7 09/29/2018   HCT 44.0 09/29/2018   MCV 92.2 09/29/2018   PLT 167 09/29/2018   Lab Results  Component Value Date   FERRITIN 321 (H) 08/04/2018   IRON 71 08/04/2018   TIBC 258 08/04/2018   UIBC 187 08/04/2018   IRONPCTSAT 28 08/04/2018   Lab Results  Component Value Date   RBC 4.77 09/29/2018   No results found for: KPAFRELGTCHN, LAMBDASER, [facility] Lab Results  Component Value Date   IGGSERUM 675 (L) 11/10/2016   IGMSERUM 86 11/10/2016   No results found for: Odetta Pink, SPEI   Chemistry      Component Value Date/Time   NA 139 09/29/2018 1003   NA 144 01/27/2017 1302   NA 138 09/16/2016 1105   K 3.9 09/29/2018 1003   K 4.3 01/27/2017 1302   K 4.2 09/16/2016 1105   CL 101 09/29/2018 1003   CL 102 01/27/2017 1302   CO2 30 09/29/2018 1003   CO2 27 01/27/2017 1302   CO2 26 09/16/2016 1105   BUN 11 09/29/2018 1003   BUN 14 01/27/2017 1302   BUN 11.9 09/16/2016 1105   CREATININE 0.82 09/29/2018 1003   CREATININE 1.1 01/27/2017 1302   CREATININE 0.8 09/16/2016 1105      Component Value Date/Time   CALCIUM 8.9 09/29/2018 1003   CALCIUM 9.5 01/27/2017 1302   CALCIUM 9.0 09/16/2016 1105   ALKPHOS 129 (H) 09/29/2018 1003   ALKPHOS 116 (H) 01/27/2017 1302   ALKPHOS 136 09/16/2016 1105   AST 13 (L) 09/29/2018 1003   AST 8 09/16/2016 1105   ALT 14 09/29/2018 1003   ALT 25 01/27/2017 1302   ALT 12 09/16/2016 1105   BILITOT 0.5 09/29/2018 1003   BILITOT 0.32 09/16/2016 1105       Impression and Plan: Tabitha Estrada is a very pleasant 48 yo with chronic phase CMLand history of iron deficiency anemia.   She will continue on Sprycel every other day.  I told her that she will have to be on Sprycel for several years.  We waited that we will stop the Sprycel as if she has a negative BCR/ABL analysis for 4 years in a row.  I told her that we likely will probably just decrease the dose of Sprycel down the road.  We will see her back in 3 months now.  I feel very confident that her BCR/ABL analysis will be markedly improved.     [name], MD 7/31/202010:56 AM

## 2018-10-12 LAB — BCR/ABL

## 2018-11-03 ENCOUNTER — Other Ambulatory Visit: Payer: Self-pay | Admitting: Osteopathic Medicine

## 2018-11-03 DIAGNOSIS — J449 Chronic obstructive pulmonary disease, unspecified: Secondary | ICD-10-CM

## 2018-11-03 NOTE — Telephone Encounter (Signed)
Requested medication (s) are due for refill today:no  Requested medication (s) are on the active medication list: no  Last refill:  11/18/2017  Future visit scheduled: no  Notes to clinic:  Review for refill   Requested Prescriptions  Pending Prescriptions Disp Refills   montelukast (SINGULAIR) 10 MG tablet [Pharmacy Med Name: MONTELUKAST 10MG ] 90 tablet 2    Sig: Take 1 tablet (10 mg total) by mouth daily.     Pulmonology:  Leukotriene Inhibitors Failed - 11/03/2018  8:44 AM      Failed - Valid encounter within last 12 months    Recent Outpatient Visits          7 months ago COPD mixed type [facility])   [place] [facility], [place], DO   11 months ago Benign paroxysmal positional vertigo, unspecified laterality   [place] [facility], Lanelle Bal, DO   1 year ago COPD mixed type [facility])   [place] [facility], Gwen Her, MD   1 year ago COPD mixed type [facility])   [place] [facility], Gwen Her, MD   1 year ago COPD mixed type [facility])   [facility], Gwen Her, MD

## 2018-11-23 MED FILL — SPRYCEL 100 MG TABLET: 100 | 30 days supply | Qty: 30 | Fill #2

## 2018-12-28 ENCOUNTER — Other Ambulatory Visit: Payer: Self-pay | Admitting: Osteopathic Medicine

## 2018-12-28 DIAGNOSIS — Z794 Long term (current) use of insulin: Secondary | ICD-10-CM

## 2018-12-28 DIAGNOSIS — E1165 Type 2 diabetes mellitus with hyperglycemia: Secondary | ICD-10-CM

## 2018-12-28 NOTE — Telephone Encounter (Signed)
Requested medication (s) are due for refill today: yes  Requested medication (s) are on the active medication list: yes  Last refill:  01/13/2018  Future visit scheduled: no  Notes to clinic:  Overdue for follow up   Requested Prescriptions  Pending Prescriptions Disp Refills   atorvastatin (LIPITOR) 80 MG tablet [Pharmacy Med Name: ATORVASTATIN 80MG ] 90 tablet 2    Sig: Take 1 tablet (80 mg total) by mouth daily.     Cardiovascular:  Antilipid - Statins Failed - 12/28/2018  8:42 AM      Failed - Total Cholesterol in normal range and within 360 days    Cholesterol  Date Value Ref Range Status  01/03/2017 156 <200 mg/dL Final         Failed - LDL in normal range and within 360 days    LDL Cholesterol (Calc)  Date Value Ref Range Status  01/03/2017 95 mg/dL (calc) Final    Comment:    Reference range: <100 . Desirable range <100 mg/dL for primary prevention;   <70 mg/dL for patients with CHD or diabetic patients  with > or = 2 CHD risk factors. Marland Kitchen LDL-C is now calculated using the Martin-Hopkins  calculation, which is a validated novel method providing  better accuracy than the Friedewald equation in the  estimation of LDL-C.  Cresenciano Genre et al. Annamaria Helling. [id number]): 2061-2068  (http://education.[facility].com/faq/FAQ164)          Failed - HDL in normal range and within 360 days    HDL  Date Value Ref Range Status  01/03/2017 33 (L) >50 mg/dL Final         Failed - Triglycerides in normal range and within 360 days    Triglycerides  Date Value Ref Range Status  01/03/2017 179 (H) <150 mg/dL Final         Failed - Valid encounter within last 12 months    Recent Outpatient Visits          9 months ago COPD mixed type [facility])   [place] [facility], Lanelle Bal, DO   1 year ago Benign paroxysmal positional vertigo, unspecified laterality   [place] [facility] [place], Lanelle Bal, DO   1 year ago COPD  mixed type [facility])   [place] [facility], Gwen Her, MD   1 year ago COPD mixed type [facility])   [place] [facility], Gwen Her, MD   1 year ago COPD mixed type [facility])   [place] [facility], Gwen Her, MD             Passed - Patient is not pregnant

## 2018-12-29 ENCOUNTER — Inpatient Hospital Stay: Payer: Medicaid Other | Attending: Hematology & Oncology | Admitting: Family

## 2018-12-29 ENCOUNTER — Inpatient Hospital Stay: Payer: Medicaid Other

## 2018-12-29 ENCOUNTER — Other Ambulatory Visit: Payer: Self-pay

## 2018-12-29 VITALS — BP 153/68 | HR 76 | Temp 97.5°F | Resp 17

## 2018-12-29 DIAGNOSIS — C9210 Chronic myeloid leukemia, BCR/ABL-positive, not having achieved remission: Secondary | ICD-10-CM

## 2018-12-29 DIAGNOSIS — Z79899 Other long term (current) drug therapy: Secondary | ICD-10-CM | POA: Diagnosis not present

## 2018-12-29 DIAGNOSIS — R609 Edema, unspecified: Secondary | ICD-10-CM | POA: Insufficient documentation

## 2018-12-29 DIAGNOSIS — F17210 Nicotine dependence, cigarettes, uncomplicated: Secondary | ICD-10-CM | POA: Insufficient documentation

## 2018-12-29 DIAGNOSIS — D509 Iron deficiency anemia, unspecified: Secondary | ICD-10-CM | POA: Diagnosis present

## 2018-12-29 LAB — CBC WITH DIFFERENTIAL (CANCER CENTER ONLY)
Abs Immature Granulocytes: 0.02 10*3/uL (ref 0.00–0.07)
Basophils Absolute: 0 10*3/uL (ref 0.0–0.1)
Basophils Relative: 0 %
Eosinophils Absolute: 0 10*3/uL (ref 0.0–0.5)
Eosinophils Relative: 0 %
HCT: 39 % (ref 36.0–46.0)
Hemoglobin: 12.3 g/dL (ref 12.0–15.0)
Immature Granulocytes: 0 %
Lymphocytes Relative: 19 %
Lymphs Abs: 1.1 10*3/uL (ref 0.7–4.0)
MCH: 27.8 pg (ref 26.0–34.0)
MCHC: 31.5 g/dL (ref 30.0–36.0)
MCV: 88 fL (ref 80.0–100.0)
Monocytes Absolute: 0.3 10*3/uL (ref 0.1–1.0)
Monocytes Relative: 4 %
Neutro Abs: 4.5 10*3/uL (ref 1.7–7.7)
Neutrophils Relative %: 77 %
Platelet Count: 122 10*3/uL — ABNORMAL LOW (ref 150–400)
RBC: 4.43 MIL/uL (ref 3.87–5.11)
RDW: 17.7 % — ABNORMAL HIGH (ref 11.5–15.5)
WBC Count: 5.9 10*3/uL (ref 4.0–10.5)
nRBC: 0 % (ref 0.0–0.2)

## 2018-12-29 LAB — CMP (CANCER CENTER ONLY)
ALT: 16 U/L (ref 0–44)
AST: 11 U/L — ABNORMAL LOW (ref 15–41)
Albumin: 4.1 g/dL (ref 3.5–5.0)
Alkaline Phosphatase: 101 U/L (ref 38–126)
Anion gap: 9 (ref 5–15)
BUN: 12 mg/dL (ref 6–20)
CO2: 32 mmol/L (ref 22–32)
Calcium: 9 mg/dL (ref 8.9–10.3)
Chloride: 100 mmol/L (ref 98–111)
Creatinine: 0.9 mg/dL (ref 0.44–1.00)
GFR, Est AFR Am: >60 mL/min (ref >60)
GFR, Estimated: >60 mL/min (ref >60)
Glucose, Bld: 249 mg/dL — ABNORMAL HIGH (ref 70–99)
Potassium: 3.5 mmol/L (ref 3.5–5.1)
Sodium: 141 mmol/L (ref 135–145)
Total Bilirubin: 0.7 mg/dL (ref 0.3–1.2)
Total Protein: 6.4 g/dL — ABNORMAL LOW (ref 6.5–8.1)

## 2018-12-29 LAB — LACTATE DEHYDROGENASE: LDH: 262 U/L — ABNORMAL HIGH (ref 98–192)

## 2018-12-29 LAB — SAVE SMEAR(SSMR), FOR PROVIDER SLIDE REVIEW

## 2018-12-29 NOTE — Progress Notes (Signed)
Hematology and Oncology Follow Up Visit  XOE MECKLER [id number] 08-19-1970 48 y.o. 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 Kolenovic is here today for follow-up. She has been having issues for a while now with diarrhea whenever she eats. She had a colonoscopy earlier this month which was negative and GI has prescribed Creon and she plans to start today.  Her blood sugars are not well controlled at this time.  BCR ABL in July was down to 14.97%. WBC count today is stable at 5.9, Hgb 12.3 and platelets are 122.  She has had sinus congestion, cough with yellow phlegm and headaches lately. She plans to talk with a pharmacist and see what she can take over the counter that won't exacerbate her HTN.  She is still smoking 1 ppd.  No fever, chills, n/v, rash, dizziness, SOB, chest pain, palpitations or changes in bladder habits.  No bleeding, bruising or petechiae.  She has a little puffiness and tingling in her right lower extremity that comes and goes. She still takes lasix daily which has helped reduce fluid retention.  She is eating well but admits that she needs to better hydrate throughout the day. Her weight is stable.   ECOG Performance Status: 1 - Symptomatic but completely ambulatory  Medications:  Allergies as of 12/29/2018      Reactions   Dulaglutide Nausea And Vomiting, Other (See Comments)   Sulfur Nausea And Vomiting   Metformin Diarrhea      Medication List       Accurate as of December 29, 2018 10:49 AM. If you have any questions, ask your nurse or doctor.        albuterol 108 (90 Base) MCG/ACT inhaler Commonly known as: VENTOLIN HFA Inhale 1-2 puffs into the lungs every 4 (four) hours as needed for wheezing or shortness of breath.    albuterol (2.5 MG/3ML) 0.083% nebulizer solution Commonly known as: PROVENTIL Inhale the contents of one vial in nebulizer every four to six hours as needed for cough or wheeze.   aluminum-magnesium hydroxide-simethicone [id number] MG/5ML Susp Commonly known as: MAALOX Take by mouth.   amoxicillin-clavulanate 875-125 MG tablet Commonly known as: AUGMENTIN Take 1 tablet by mouth 2 (two) times daily.   atorvastatin 80 MG tablet Commonly known as: LIPITOR Take 1 tablet (80 mg total) by mouth daily.   budesonide-formoterol 160-4.5 MCG/ACT inhaler Commonly known as: SYMBICORT Inhale 2 puffs into the lungs 2 (two) times daily.   clobetasol ointment 0.05 % Commonly known as: TEMOVATE Apply 1 application topically 2 (two) times daily.   dasatinib 100 MG tablet Commonly known as: SPRYCEL Take 1 tablet (100 mg total) by mouth daily.   diphenoxylate-atropine 2.5-0.025 MG tablet Commonly known as: LOMOTIL Take 1 tablet by mouth 4 (four) times daily as needed for diarrhea or loose stools (as needed after taking Sprycel for diarrhea).   fluticasone 50 MCG/ACT nasal spray Commonly known as: FLONASE Use two sprays in each nostril once daily as directed.   furosemide 40 MG tablet Commonly known as: LASIX Take 1 pill daily for 3 days for facial swelling.  Then take daily as needed.   insulin aspart 100 UNIT/ML FlexPen Commonly known as: NovoLOG FlexPen Inject 40 Units into the skin daily with supper. What changed:  how much to take  additional instructions   Insulin Glargine 100 UNIT/ML Solostar Pen Commonly known as: Lantus SoloStar Inject 140 Units into the skin daily. And pen needles 5/day What changed: how much to take   ketoconazole 2 % cream Commonly known as: NIZORAL Apply topically 2 (two) times a day.   meclizine 25 MG tablet Commonly known as: ANTIVERT Take 1 tablet (25 mg total) by mouth 3 (three) times daily as needed for dizziness.   meloxicam 7.5 MG tablet  Commonly known as: MOBIC Take 7.5 mg by mouth daily.   montelukast 10 MG tablet Commonly known as: SINGULAIR TAKE 1 TABLET (10 MG TOTAL) BY MOUTH DAILY   nystatin powder Commonly known as: MYCOSTATIN/NYSTOP Apply 1 g topically 2 (two) times daily as needed.   omeprazole 40 MG capsule Commonly known as: PRILOSEC Take 40 mg by mouth daily.   ondansetron 8 MG disintegrating tablet Commonly known as: ZOFRAN-ODT Dissolve 1 tablet (8 mg total) by mouth every 8 (eight) hours as needed for nausea.   oxyCODONE-acetaminophen 5-325 MG tablet Commonly known as: PERCOCET/ROXICET Take 1 tablet by mouth every 6 (six) hours as needed for severe pain.   pregabalin 200 MG capsule Commonly known as: LYRICA TAKE 1 CAPSULE BY MOUTH 2 TIMES DAILY.   tiZANidine 4 MG tablet Commonly known as: ZANAFLEX Take 4 mg by mouth 3 times/day as needed-between meals & bedtime.   valACYclovir 500 MG tablet Commonly known as: VALTREX TAKE ONE TABLET BY MOUTH AT BEDTIME       Allergies:  Allergies  Allergen Reactions  . Dulaglutide Nausea And Vomiting and Other (See Comments)  . Sulfur Nausea And Vomiting  . Metformin Diarrhea    Past Medical History, Surgical history, Social history, and Family History were reviewed and updated.  Review of Systems: All other 10 point review of systems is negative.   Physical Exam:  temporal temperature is 97.5 F (36.4 C) (abnormal). Her blood pressure is 153/68 (abnormal) and her pulse is 76. Her respiration is 17.   Wt Readings from Last 3 Encounters:  09/29/18 251 lb (113.9 kg)  08/28/18 247 lb (112 kg)  08/04/18 258 lb 6.4 oz (117.2 kg)    Ocular: Sclerae unicteric, pupils equal, round and reactive to light Ear-nose-throat: Oropharynx clear, dentition fair Lymphatic: No cervical or supraclavicular adenopathy Lungs no rales or rhonchi, good excursion bilaterally Heart regular rate and rhythm, no murmur appreciated Abd soft, nontender, positive bowel  sounds, no liver or spleen tip palpated on exam, no fluid wave  MSK no focal spinal tenderness, no joint edema Neuro: non-focal, well-oriented, appropriate affect Breasts: Deferred   Lab Results  Component Value Date   WBC 5.9 12/29/2018   HGB 12.3 12/29/2018   HCT 39.0 12/29/2018   MCV 88.0 12/29/2018   PLT 122 (L) 12/29/2018   Lab Results  Component Value Date   FERRITIN 321 (H) 08/04/2018   IRON 71 08/04/2018   TIBC 258 08/04/2018   UIBC 187 08/04/2018   IRONPCTSAT 28 08/04/2018   Lab Results  Component Value Date   RBC 4.43 12/29/2018   No results found for: KPAFRELGTCHN, LAMBDASER, KAPLAMBRATIO Lab Results  Component Value Date   IGGSERUM 675 (L) 11/10/2016   IGMSERUM 86 11/10/2016   No results found for: Odetta Pink, SPEI   Chemistry      Component Value Date/Time   NA 141 12/29/2018 0955   NA 144 01/27/2017 1302   NA 138 09/16/2016  1105   K 3.5 12/29/2018 0955   K 4.3 01/27/2017 1302   K 4.2 09/16/2016 1105   CL 100 12/29/2018 0955   CL 102 01/27/2017 1302   CO2 32 12/29/2018 0955   CO2 27 01/27/2017 1302   CO2 26 09/16/2016 1105   BUN 12 12/29/2018 0955   BUN 14 01/27/2017 1302   BUN 11.9 09/16/2016 1105   CREATININE 0.90 12/29/2018 0955   CREATININE 1.1 01/27/2017 1302   CREATININE 0.8 09/16/2016 1105      Component Value Date/Time   CALCIUM 9.0 12/29/2018 0955   CALCIUM 9.5 01/27/2017 1302   CALCIUM 9.0 09/16/2016 1105   ALKPHOS 101 12/29/2018 0955   ALKPHOS 116 (H) 01/27/2017 1302   ALKPHOS 136 09/16/2016 1105   AST 11 (L) 12/29/2018 0955   AST 8 09/16/2016 1105   ALT 16 12/29/2018 0955   ALT 25 01/27/2017 1302   ALT 12 09/16/2016 1105   BILITOT 0.7 12/29/2018 0955   BILITOT 0.32 09/16/2016 1105       Impression and Plan: Ms. Troyan is a very pleasant 48 yo with chronic phase CMLand history of iron deficiency anemia. She is still having issues with diarrhea but will be starting  Creon with meals and snacks today. Hopefully this will help.  Her WBC count remains stable along with her Hgb and platelets are 122.  She will continue her same regimen with Sprycel 100 mg PO every other day. She did verbalize that she is taking as prescribed.  BCR ABL is pending.  We will go ahead and plan to see her back in another 6 weeks.  She will contact our office with any questions or concerns. We can certainly see her sooner if needed.   Laverna [name], NP 10/30/202010:49 AM

## 2019-01-04 ENCOUNTER — Encounter: Payer: Self-pay | Admitting: Family

## 2019-01-12 ENCOUNTER — Telehealth: Payer: Self-pay | Admitting: *Deleted

## 2019-01-12 LAB — BCR/ABL

## 2019-01-12 NOTE — Telephone Encounter (Addendum)
Message left on personal voice mail  ----- Message from [name], MD sent at 01/12/2019  9:57 AM EST ----- Call - the [facility] is almost in remission!!  Keep taking the Sprycel!!!!  Happy Thanksgiving!!  Laurey Arrow

## 2019-01-28 ENCOUNTER — Other Ambulatory Visit: Payer: Self-pay | Admitting: Osteopathic Medicine

## 2019-01-28 DIAGNOSIS — J449 Chronic obstructive pulmonary disease, unspecified: Secondary | ICD-10-CM

## 2019-02-08 ENCOUNTER — Inpatient Hospital Stay: Payer: Medicaid Other

## 2019-02-09 ENCOUNTER — Inpatient Hospital Stay: Payer: Medicaid Other | Admitting: Family

## 2019-02-09 ENCOUNTER — Other Ambulatory Visit: Payer: Medicaid Other

## 2019-02-10 ENCOUNTER — Other Ambulatory Visit: Payer: Self-pay | Admitting: Osteopathic Medicine

## 2019-02-10 DIAGNOSIS — Z794 Long term (current) use of insulin: Secondary | ICD-10-CM

## 2019-02-11 NOTE — Telephone Encounter (Signed)
Requested medication (s) are due for refill today: yes  Requested medication (s) are on the active medication list:  yes  Last refill:  12/29/2018  Future visit scheduled: no  Notes to clinic:  review for refill   Requested Prescriptions  Pending Prescriptions Disp Refills   atorvastatin (LIPITOR) 80 MG tablet [Pharmacy Med Name: ATORVASTATIN 80MG ] 30 tablet 0    Sig: Take 1 tablet (80 mg total) by mouth daily. Needs lab work      Cardiovascular:  Antilipid - Statins Failed - 02/10/2019  9:53 AM      Failed - Total Cholesterol in normal range and within 360 days    Cholesterol  Date Value Ref Range Status  01/03/2017 156 <200 mg/dL Final          Failed - LDL in normal range and within 360 days    LDL Cholesterol (Calc)  Date Value Ref Range Status  01/03/2017 95 mg/dL (calc) Final    Comment:    Reference range: <100 . Desirable range <100 mg/dL for primary prevention;   <70 mg/dL for patients with CHD or diabetic patients  with > or = 2 CHD risk factors. Marland Kitchen LDL-C is now calculated using the Martin-Hopkins  calculation, which is a validated novel method providing  better accuracy than the Friedewald equation in the  estimation of LDL-C.  Cresenciano Genre et al. Annamaria Helling. 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[facility])   [facility], Lanelle Bal, DO   1 year ago Benign paroxysmal positional vertigo, unspecified laterality   [place] [facility] [place], Lanelle Bal,  DO   1 year ago COPD mixed type [facility])   [place] [facility], Gwen Her, MD   1 year ago COPD mixed type [facility])   [place] [facility], Gwen Her, MD   1 year ago COPD mixed type [facility])    [facility], Gwen Her, MD       Future Appointments             In 2 weeks [place], Holli Humbles, NP [place] - Patient is not pregnant

## 2019-02-13 ENCOUNTER — Other Ambulatory Visit: Payer: Self-pay | Admitting: Osteopathic Medicine

## 2019-02-13 DIAGNOSIS — E1165 Type 2 diabetes mellitus with hyperglycemia: Secondary | ICD-10-CM

## 2019-02-24 ENCOUNTER — Other Ambulatory Visit: Payer: Self-pay | Admitting: Osteopathic Medicine

## 2019-02-24 DIAGNOSIS — B009 Herpesviral infection, unspecified: Secondary | ICD-10-CM

## 2019-02-24 NOTE — Telephone Encounter (Signed)
Forwarding medication refill request to the clinical pool for review. 

## 2019-02-26 ENCOUNTER — Inpatient Hospital Stay: Payer: Medicaid Other | Attending: Hematology & Oncology

## 2019-02-26 ENCOUNTER — Other Ambulatory Visit: Payer: Self-pay

## 2019-02-26 DIAGNOSIS — D509 Iron deficiency anemia, unspecified: Secondary | ICD-10-CM | POA: Diagnosis not present

## 2019-02-26 DIAGNOSIS — R5382 Chronic fatigue, unspecified: Secondary | ICD-10-CM | POA: Insufficient documentation

## 2019-02-26 DIAGNOSIS — Z794 Long term (current) use of insulin: Secondary | ICD-10-CM | POA: Diagnosis not present

## 2019-02-26 DIAGNOSIS — R200 Anesthesia of skin: Secondary | ICD-10-CM | POA: Diagnosis not present

## 2019-02-26 DIAGNOSIS — C9210 Chronic myeloid leukemia, BCR/ABL-positive, not having achieved remission: Secondary | ICD-10-CM | POA: Insufficient documentation

## 2019-02-26 DIAGNOSIS — Z79899 Other long term (current) drug therapy: Secondary | ICD-10-CM | POA: Insufficient documentation

## 2019-02-26 DIAGNOSIS — R202 Paresthesia of skin: Secondary | ICD-10-CM | POA: Diagnosis not present

## 2019-02-26 DIAGNOSIS — R609 Edema, unspecified: Secondary | ICD-10-CM | POA: Insufficient documentation

## 2019-02-26 LAB — CBC WITH DIFFERENTIAL (CANCER CENTER ONLY)
Abs Immature Granulocytes: 0.02 10*3/uL (ref 0.00–0.07)
Basophils Absolute: 0 10*3/uL (ref 0.0–0.1)
Basophils Relative: 0 %
Eosinophils Absolute: 0.1 10*3/uL (ref 0.0–0.5)
Eosinophils Relative: 1 %
HCT: 44.9 % (ref 36.0–46.0)
Hemoglobin: 14.1 g/dL (ref 12.0–15.0)
Immature Granulocytes: 0 %
Lymphocytes Relative: 20 %
Lymphs Abs: 1.4 10*3/uL (ref 0.7–4.0)
MCH: 28.4 pg (ref 26.0–34.0)
MCHC: 31.4 g/dL (ref 30.0–36.0)
MCV: 90.5 fL (ref 80.0–100.0)
Monocytes Absolute: 0.3 10*3/uL (ref 0.1–1.0)
Monocytes Relative: 5 %
Neutro Abs: 5 10*3/uL (ref 1.7–7.7)
Neutrophils Relative %: 74 %
Platelet Count: 131 10*3/uL — ABNORMAL LOW (ref 150–400)
RBC: 4.96 MIL/uL (ref 3.87–5.11)
RDW: 15.1 % (ref 11.5–15.5)
WBC Count: 6.8 10*3/uL (ref 4.0–10.5)
nRBC: 0 % (ref 0.0–0.2)

## 2019-02-26 LAB — CMP (CANCER CENTER ONLY)
ALT: 23 U/L (ref 0–44)
AST: 16 U/L (ref 15–41)
Albumin: 3.9 g/dL (ref 3.5–5.0)
Alkaline Phosphatase: 108 U/L (ref 38–126)
Anion gap: 7 (ref 5–15)
BUN: 14 mg/dL (ref 6–20)
CO2: 32 mmol/L (ref 22–32)
Calcium: 9.5 mg/dL (ref 8.9–10.3)
Chloride: 100 mmol/L (ref 98–111)
Creatinine: 0.9 mg/dL (ref 0.44–1.00)
GFR, Est AFR Am: >60 mL/min (ref >60)
GFR, Estimated: >60 mL/min (ref >60)
Glucose, Bld: 215 mg/dL — ABNORMAL HIGH (ref 70–99)
Potassium: 4.3 mmol/L (ref 3.5–5.1)
Sodium: 139 mmol/L (ref 135–145)
Total Bilirubin: 0.5 mg/dL (ref 0.3–1.2)
Total Protein: 6.7 g/dL (ref 6.5–8.1)

## 2019-02-26 LAB — SAVE SMEAR(SSMR), FOR PROVIDER SLIDE REVIEW

## 2019-02-26 LAB — LACTATE DEHYDROGENASE: LDH: 234 U/L — ABNORMAL HIGH (ref 98–192)

## 2019-02-27 ENCOUNTER — Inpatient Hospital Stay (HOSPITAL_BASED_OUTPATIENT_CLINIC_OR_DEPARTMENT_OTHER): Payer: Medicaid Other | Admitting: Family

## 2019-02-27 ENCOUNTER — Telehealth: Payer: Self-pay | Admitting: Family

## 2019-02-27 DIAGNOSIS — D508 Other iron deficiency anemias: Secondary | ICD-10-CM

## 2019-02-27 DIAGNOSIS — C9210 Chronic myeloid leukemia, BCR/ABL-positive, not having achieved remission: Secondary | ICD-10-CM

## 2019-02-27 MED ORDER — DASATINIB 100 MG PO TABS: ORAL_TABLET | ORAL | 6 refills | Status: DC

## 2019-02-27 MED FILL — SPRYCEL 100 MG TABLET: 100 | 30 days supply | Qty: 30 | Fill #3

## 2019-02-27 NOTE — Telephone Encounter (Signed)
Called and LMVM for patient regarding appointments added per 12/29 los

## 2019-02-27 NOTE — Progress Notes (Signed)
Hematology and Oncology Follow Up Visit  Tabitha Estrada [id number] Nov 25, 1970 48 y.o. 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 She is doing well but still has consistent fatigue.  She is tolerating Sprycel 100 mg PO every other day nicely and has had a good response.  October BCR ABL was 3.618%.  WBC count is 6.8, Hgb is 14.1 and platelets 131.  She still has diarrhea but this has become less frequent. He GI doctor started her on a new medication to help with this.  No fever, chills, n/v, cough, rash, dizziness, SOB, chest pain, palpitations, abdominal pain or changes in bladder habits.  She has pain in her neck, back and legs secondary to arthritis. This has been more intense lately with the rainy weather. She is currently taking Advil and also has Zanaflex if needed.  She does note numbness and tingling in her left arm and hand at times.  She has puffiness in her lower extremities that waxes and wanes. She has Lasix to take if needed to reduce the fluid retention.  No falls or syncopal episodes to report.  She has maintained a good appetite and is staying well hydrated. Her weight is stable.   ECOG Performance Status: 1 - Symptomatic but completely ambulatory  Medications:  Allergies as of 02/27/2019      Reactions   Dulaglutide Nausea And Vomiting, Other (See Comments)   Sulfur Nausea And Vomiting   Metformin Diarrhea      Medication List       Accurate as of February 27, 2019 11:53 AM. If you have any questions, ask your nurse or doctor.        albuterol 108 (90 Base) MCG/ACT inhaler Commonly known as: VENTOLIN HFA Inhale 1-2 puffs into the lungs every 4 (four) hours as needed for wheezing or  shortness of breath.   albuterol (2.5 MG/3ML) 0.083% nebulizer solution Commonly known as: PROVENTIL Inhale the contents of one vial in nebulizer every four to six hours as needed for cough or wheeze.   aluminum-magnesium hydroxide-simethicone [id number] MG/5ML Susp Commonly known as: MAALOX Take by mouth.   amoxicillin-clavulanate 875-125 MG tablet Commonly known as: AUGMENTIN Take 1 tablet by mouth 2 (two) times daily.   atorvastatin 80 MG tablet Commonly known as: LIPITOR Take 1 tablet (80 mg total) by mouth daily. Needs lab work   budesonide-formoterol 160-4.5 MCG/ACT inhaler Commonly known as: SYMBICORT Inhale 2 puffs into the lungs 2 (two) times daily.   clobetasol ointment 0.05 % Commonly known as: TEMOVATE Apply 1 application topically 2 (two) times daily.   dasatinib 100 MG tablet Commonly known as: SPRYCEL Take 1 tablet (100 mg total) by mouth daily.   diphenoxylate-atropine 2.5-0.025 MG tablet Commonly known as: LOMOTIL Take 1 tablet by mouth 4 (four) times daily as needed for diarrhea or loose stools (as needed after taking Sprycel for diarrhea).   fluticasone 50 MCG/ACT nasal spray Commonly known as: FLONASE Use two sprays in each nostril once daily as directed.   furosemide 40 MG tablet Commonly known as: LASIX Take 1 pill daily for 3 days for facial swelling.  Then take daily as needed.   insulin aspart 100 UNIT/ML FlexPen Commonly known as: NovoLOG FlexPen Inject  40 Units into the skin daily with supper. What changed:   how much to take  additional instructions   Insulin Glargine 100 UNIT/ML Solostar Pen Commonly known as: Lantus SoloStar Inject 140 Units into the skin daily. And pen needles 5/day What changed: how much to take   ketoconazole 2 % cream Commonly known as: NIZORAL Apply topically 2 (two) times a day.   meclizine 25 MG tablet Commonly known as: ANTIVERT Take 1 tablet (25 mg total) by mouth 3 (three) times daily as needed for  dizziness.   meloxicam 7.5 MG tablet Commonly known as: MOBIC Take 7.5 mg by mouth daily.   montelukast 10 MG tablet Commonly known as: SINGULAIR TAKE 1 TABLET (10 MG TOTAL) BY MOUTH DAILY   nystatin powder Commonly known as: MYCOSTATIN/NYSTOP Apply 1 g topically 2 (two) times daily as needed.   omeprazole 40 MG capsule Commonly known as: PRILOSEC Take 40 mg by mouth daily.   ondansetron 8 MG disintegrating tablet Commonly known as: ZOFRAN-ODT Dissolve 1 tablet (8 mg total) by mouth every 8 (eight) hours as needed for nausea.   oxyCODONE-acetaminophen 5-325 MG tablet Commonly known as: PERCOCET/ROXICET Take 1 tablet by mouth every 6 (six) hours as needed for severe pain.   pregabalin 200 MG capsule Commonly known as: LYRICA TAKE 1 CAPSULE BY MOUTH 2 TIMES DAILY.   tiZANidine 4 MG tablet Commonly known as: ZANAFLEX Take 4 mg by mouth 3 times/day as needed-between meals & bedtime.   valACYclovir 500 MG tablet Commonly known as: VALTREX TAKE ONE TABLET BY MOUTH AT BEDTIME       Allergies:  Allergies  Allergen Reactions  . Dulaglutide Nausea And Vomiting and Other (See Comments)  . Sulfur Nausea And Vomiting  . Metformin Diarrhea    Past Medical History, Surgical history, Social history, and Family History were reviewed and updated.  Review of Systems: All other 10 point review of systems is negative.   Physical Exam:  vitals were not taken for this visit.   Wt Readings from Last 3 Encounters:  09/29/18 251 lb (113.9 kg)  08/28/18 247 lb (112 kg)  08/04/18 258 lb 6.4 oz (117.2 kg)     Lab Results  Component Value Date   WBC 6.8 02/26/2019   HGB 14.1 02/26/2019   HCT 44.9 02/26/2019   MCV 90.5 02/26/2019   PLT 131 (L) 02/26/2019   Lab Results  Component Value Date   FERRITIN 321 (H) 08/04/2018   IRON 71 08/04/2018   TIBC 258 08/04/2018   UIBC 187 08/04/2018   IRONPCTSAT 28 08/04/2018   Lab Results  Component Value Date   RBC 4.96 02/26/2019    No results found for: KPAFRELGTCHN, LAMBDASER, KAPLAMBRATIO Lab Results  Component Value Date   IGGSERUM 675 (L) 11/10/2016   IGMSERUM 86 11/10/2016   No results found for: Odetta Pink, SPEI   Chemistry      Component Value Date/Time   NA 139 02/26/2019 1044   NA 144 01/27/2017 1302   NA 138 09/16/2016 1105   K 4.3 02/26/2019 1044   K 4.3 01/27/2017 1302   K 4.2 09/16/2016 1105   CL 100 02/26/2019 1044   CL 102 01/27/2017 1302   CO2 32 02/26/2019 1044   CO2 27 01/27/2017 1302   CO2 26 09/16/2016 1105   BUN 14 02/26/2019 1044   BUN 14 01/27/2017 1302   BUN 11.9 09/16/2016 1105   CREATININE 0.90 02/26/2019 1044  CREATININE 1.1 01/27/2017 1302   CREATININE 0.8 09/16/2016 1105      Component Value Date/Time   CALCIUM 9.5 02/26/2019 1044   CALCIUM 9.5 01/27/2017 1302   CALCIUM 9.0 09/16/2016 1105   ALKPHOS 108 02/26/2019 1044   ALKPHOS 116 (H) 01/27/2017 1302   ALKPHOS 136 09/16/2016 1105   AST 16 02/26/2019 1044   AST 8 09/16/2016 1105   ALT 23 02/26/2019 1044   ALT 25 01/27/2017 1302   ALT 12 09/16/2016 1105   BILITOT 0.5 02/26/2019 1044   BILITOT 0.32 09/16/2016 1105       Impression and Plan: Tabitha Estrada is a very pleasant 48 yo with chronic phase CMLand history of iron deficiency anemia. She will continue her same regimen with Sprycel 100 mg PO every other day. Prescription was fixed to reflect this frequency.  We will see her back in another 8 weeks.  She will contact our office with any questions or concerns. We can certainly see her sooner if needed.   Laverna [name], NP 12/29/202011:53 AM

## 2019-03-01 ENCOUNTER — Other Ambulatory Visit: Payer: Self-pay | Admitting: Osteopathic Medicine

## 2019-03-01 DIAGNOSIS — E1165 Type 2 diabetes mellitus with hyperglycemia: Secondary | ICD-10-CM

## 2019-03-01 NOTE — Telephone Encounter (Signed)
Requested medication (s) are due for refill today: yes  Requested medication (s) are on the active medication list:yes  Last refill:  12/29/2018  Future visit scheduled:no  Notes to clinic:  review for refill   Requested Prescriptions  Pending Prescriptions Disp Refills   atorvastatin (LIPITOR) 80 MG tablet [Pharmacy Med Name: ATORVASTATIN 80MG ] 30 tablet 0    Sig: Take 1 tablet (80 mg total) by mouth daily. Needs lab work      Cardiovascular:  Antilipid - Statins Failed - 03/01/2019  9:53 AM      Failed - Total Cholesterol in normal range and within 360 days    Cholesterol  Date Value Ref Range Status  01/03/2017 156 <200 mg/dL Final          Failed - LDL in normal range and within 360 days    LDL Cholesterol (Calc)  Date Value Ref Range Status  01/03/2017 95 mg/dL (calc) Final    Comment:    Reference range: <100 . Desirable range <100 mg/dL for primary prevention;   <70 mg/dL for patients with CHD or diabetic patients  with > or = 2 CHD risk factors. Marland Kitchen LDL-C is now calculated using the Martin-Hopkins  calculation, which is a validated novel method providing  better accuracy than the Friedewald equation in the  estimation of LDL-C.  Cresenciano Genre et al. Annamaria Helling. 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[facility])   [facility], Lanelle Bal, DO   1 year ago Benign paroxysmal positional vertigo, unspecified laterality   [facility], Lanelle Bal, DO    1 year ago COPD mixed type [facility])   [facility], Gwen Her, MD   1 year ago COPD mixed type [facility])   [facility], Gwen Her, MD   1 year ago COPD mixed type [facility])    [facility], Gwen Her, MD              Passed - Patient is not pregnant

## 2019-03-08 LAB — BCR/ABL

## 2019-03-12 ENCOUNTER — Other Ambulatory Visit: Payer: Self-pay | Admitting: Osteopathic Medicine

## 2019-03-12 DIAGNOSIS — E1165 Type 2 diabetes mellitus with hyperglycemia: Secondary | ICD-10-CM

## 2019-03-30 ENCOUNTER — Encounter: Payer: Self-pay | Admitting: Hematology & Oncology

## 2019-05-01 ENCOUNTER — Encounter: Payer: Self-pay | Admitting: Hematology & Oncology

## 2019-05-01 ENCOUNTER — Other Ambulatory Visit: Payer: Self-pay

## 2019-05-01 ENCOUNTER — Inpatient Hospital Stay: Payer: Medicaid Other | Attending: Hematology & Oncology

## 2019-05-01 ENCOUNTER — Inpatient Hospital Stay (HOSPITAL_BASED_OUTPATIENT_CLINIC_OR_DEPARTMENT_OTHER): Payer: Medicaid Other | Admitting: Hematology & Oncology

## 2019-05-01 ENCOUNTER — Telehealth: Payer: Self-pay | Admitting: Pharmacy Technician

## 2019-05-01 VITALS — BP 167/86 | HR 75 | Temp 97.5°F | Resp 18 | Ht 65.0 in | Wt 247.1 lb

## 2019-05-01 DIAGNOSIS — E1140 Type 2 diabetes mellitus with diabetic neuropathy, unspecified: Secondary | ICD-10-CM | POA: Diagnosis not present

## 2019-05-01 DIAGNOSIS — C9210 Chronic myeloid leukemia, BCR/ABL-positive, not having achieved remission: Secondary | ICD-10-CM | POA: Insufficient documentation

## 2019-05-01 DIAGNOSIS — Z79899 Other long term (current) drug therapy: Secondary | ICD-10-CM | POA: Diagnosis not present

## 2019-05-01 DIAGNOSIS — D509 Iron deficiency anemia, unspecified: Secondary | ICD-10-CM | POA: Insufficient documentation

## 2019-05-01 DIAGNOSIS — D508 Other iron deficiency anemias: Secondary | ICD-10-CM

## 2019-05-01 LAB — CBC WITH DIFFERENTIAL (CANCER CENTER ONLY)
Abs Immature Granulocytes: 0.02 10*3/uL (ref 0.00–0.07)
Basophils Absolute: 0 10*3/uL (ref 0.0–0.1)
Basophils Relative: 1 %
Eosinophils Absolute: 0 10*3/uL (ref 0.0–0.5)
Eosinophils Relative: 1 %
HCT: 42.9 % (ref 36.0–46.0)
Hemoglobin: 13.5 g/dL (ref 12.0–15.0)
Immature Granulocytes: 0 %
Lymphocytes Relative: 21 %
Lymphs Abs: 1.3 10*3/uL (ref 0.7–4.0)
MCH: 28.4 pg (ref 26.0–34.0)
MCHC: 31.5 g/dL (ref 30.0–36.0)
MCV: 90.3 fL (ref 80.0–100.0)
Monocytes Absolute: 0.3 10*3/uL (ref 0.1–1.0)
Monocytes Relative: 4 %
Neutro Abs: 4.5 10*3/uL (ref 1.7–7.7)
Neutrophils Relative %: 73 %
Platelet Count: 143 10*3/uL — ABNORMAL LOW (ref 150–400)
RBC: 4.75 MIL/uL (ref 3.87–5.11)
RDW: 14.5 % (ref 11.5–15.5)
WBC Count: 6.2 10*3/uL (ref 4.0–10.5)
nRBC: 0 % (ref 0.0–0.2)

## 2019-05-01 LAB — LACTATE DEHYDROGENASE: LDH: 247 U/L — ABNORMAL HIGH (ref 98–192)

## 2019-05-01 LAB — CMP (CANCER CENTER ONLY)
ALT: 13 U/L (ref 0–44)
AST: 10 U/L — ABNORMAL LOW (ref 15–41)
Albumin: 4.3 g/dL (ref 3.5–5.0)
Alkaline Phosphatase: 97 U/L (ref 38–126)
Anion gap: 8 (ref 5–15)
BUN: 13 mg/dL (ref 6–20)
CO2: 32 mmol/L (ref 22–32)
Calcium: 10 mg/dL (ref 8.9–10.3)
Chloride: 100 mmol/L (ref 98–111)
Creatinine: 0.86 mg/dL (ref 0.44–1.00)
GFR, Est AFR Am: >60 mL/min (ref >60)
GFR, Estimated: >60 mL/min (ref >60)
Glucose, Bld: 259 mg/dL — ABNORMAL HIGH (ref 70–99)
Potassium: 4 mmol/L (ref 3.5–5.1)
Sodium: 140 mmol/L (ref 135–145)
Total Bilirubin: 0.7 mg/dL (ref 0.3–1.2)
Total Protein: 7.1 g/dL (ref 6.5–8.1)

## 2019-05-01 LAB — RETICULOCYTES
Immature Retic Fract: 13 % (ref 2.3–15.9)
RBC.: 4.66 MIL/uL (ref 3.87–5.11)
Retic Count, Absolute: 63.4 10*3/uL (ref 19.0–186.0)
Retic Ct Pct: 1.4 % (ref 0.4–3.1)

## 2019-05-01 LAB — FERRITIN: Ferritin: 119 ng/mL (ref 11–307)

## 2019-05-01 LAB — IRON AND TIBC
Iron: 77 ug/dL (ref 41–142)
Saturation Ratios: 27 % (ref 21–57)
TIBC: 286 ug/dL (ref 236–444)
UIBC: 209 ug/dL (ref 120–384)

## 2019-05-01 LAB — SAVE SMEAR(SSMR), FOR PROVIDER SLIDE REVIEW

## 2019-05-01 MED ORDER — BOSUTINIB 100 MG PO TABS: 400.0000 mg | ORAL_TABLET | Freq: Every day | ORAL | 3 refills | Status: DC

## 2019-05-01 NOTE — Telephone Encounter (Signed)
Oral Oncology Patient Advocate Encounter  After completing a benefits investigation, prior authorization for Bosulif is not required at this time through [facility].  Patient's copay is $3.00.    [place] [facility] [place] Phone [phone] Fax [phone] 05/01/2019 1:05 PM

## 2019-05-01 NOTE — Progress Notes (Signed)
Hematology and Oncology Follow Up Visit  Tabitha Estrada [id number] May 28, 1970 49 y.o. 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 is in for follow-up.  Unfortunately, I do still think that she is going to tolerate the Sprycel.  She still having quite a bit of problems with the Sprycel.  She is having a lot of diarrhea.  She is only taking Sprycel every other day.  Her last BCR/ABL ratio was down to 5%.  This is no better than when she was tested back in October.  I just worry that she is not able to take the therapeutic dose that we really need.  [place] to see about switching over to [employer].  I am not sure if this will not cause the same problems for her.  I know that this category of aging-tyrosine kinase inhibitors-typically I will have the same set of side effects.  Another problem that she has is her blood sugars are horrible.  She has been seen at [facility] for this.  Hopefully, her endocrinologist can get her blood sugars under better control.  She has had no fever.  She has little bit of a sinusitis right now.  She is slightly congested.  She has had no bleeding.  She has had no leg swelling.  She has some neuropathy from the diabetes.  She has had no blurred vision.  She has had no mouth sores.  Thankfully, she has not had the coronavirus.  Overall, I would say performance status is probably ECOG 1-2.  Medications:  Allergies as of 05/01/2019      Reactions   Dulaglutide Nausea And Vomiting, Other (See Comments)   Sulfur Nausea And Vomiting   Metformin Diarrhea      Medication List       Accurate as of May 01, 2019 10:28 AM. If you have any questions, ask your nurse or  doctor.        STOP taking these medications   amoxicillin-clavulanate 875-125 MG tablet Commonly known as: AUGMENTIN Stopped by: [name], MD   nystatin powder Commonly known as: MYCOSTATIN/NYSTOP Stopped by: [name], MD   ondansetron 8 MG disintegrating tablet Commonly known as: ZOFRAN-ODT Stopped by: [name], MD   oxyCODONE-acetaminophen 5-325 MG tablet Commonly known as: PERCOCET/ROXICET Stopped by: [name], MD   tiZANidine 4 MG tablet Commonly known as: ZANAFLEX Stopped by: [name], MD     TAKE these medications   albuterol 108 (90 Base) MCG/ACT inhaler Commonly known as: VENTOLIN HFA Inhale 1-2 puffs into the lungs every 4 (four) hours as needed for wheezing or shortness of breath.   albuterol (2.5 MG/3ML) 0.083% nebulizer solution Commonly known as: PROVENTIL Inhale the contents of one vial in nebulizer every four to six hours as needed for cough or wheeze.   aluminum-magnesium hydroxide-simethicone [id number] MG/5ML Susp Commonly known as: MAALOX Take by mouth.   atorvastatin 80 MG tablet Commonly known as: LIPITOR TAKE 1 TABLET (80 MG TOTAL) BY MOUTH DAILY. NEEDS LAB WORK   budesonide-formoterol 160-4.5 MCG/ACT inhaler Commonly known as: SYMBICORT Inhale 2 puffs into the lungs 2 (two) times daily.   clobetasol ointment 0.05 %  Commonly known as: TEMOVATE Apply 1 application topically 2 (two) times daily.   dasatinib 100 MG tablet Commonly known as: SPRYCEL 100 mg by mouth every other day.   diphenoxylate-atropine 2.5-0.025 MG tablet Commonly known as: LOMOTIL Take 1 tablet by mouth 4 (four) times daily as needed for diarrhea or loose stools (as needed after taking Sprycel for diarrhea).   fluticasone 50 MCG/ACT nasal spray Commonly known as: FLONASE Use two sprays in each nostril once daily as directed.   furosemide 40 MG tablet Commonly known as: LASIX Take 1 pill daily for 3 days for facial swelling.   Then take daily as needed.   insulin aspart 100 UNIT/ML FlexPen Commonly known as: NovoLOG FlexPen Inject 40 Units into the skin daily with supper. What changed:   how much to take  additional instructions   Insulin Glargine 100 UNIT/ML Solostar Pen Commonly known as: Lantus SoloStar Inject 140 Units into the skin daily. And pen needles 5/day What changed: how much to take   ketoconazole 2 % cream Commonly known as: NIZORAL Apply topically 2 (two) times a day.   meclizine 25 MG tablet Commonly known as: ANTIVERT Take 1 tablet (25 mg total) by mouth 3 (three) times daily as needed for dizziness.   meloxicam 7.5 MG tablet Commonly known as: MOBIC Take 7.5 mg by mouth daily.   montelukast 10 MG tablet Commonly known as: SINGULAIR TAKE 1 TABLET (10 MG TOTAL) BY MOUTH DAILY   omeprazole 40 MG capsule Commonly known as: PRILOSEC Take 40 mg by mouth daily.   pregabalin 200 MG capsule Commonly known as: LYRICA TAKE 1 CAPSULE BY MOUTH 2 TIMES DAILY.   valACYclovir 500 MG tablet Commonly known as: VALTREX TAKE ONE TABLET BY MOUTH AT BEDTIME       Allergies:  Allergies  Allergen Reactions  . Dulaglutide Nausea And Vomiting and Other (See Comments)  . Sulfur Nausea And Vomiting  . Metformin Diarrhea    Past Medical History, Surgical history, Social history, and Family History were reviewed and updated.  Review of Systems: Review of Systems  Constitutional: Positive for malaise/fatigue.  HENT: Positive for congestion.   Eyes: Negative.   Respiratory: Positive for cough.   Cardiovascular: Negative.   Gastrointestinal: Positive for abdominal pain and diarrhea.  Genitourinary: Negative.   Musculoskeletal: Positive for joint pain.  Skin: Negative.   Neurological: Positive for tingling.  Endo/Heme/Allergies: Negative.   Psychiatric/Behavioral: Negative.      Physical Exam:  height is 5\' 5"  (1.651 m) and weight is 247 lb 1.9 oz (112.1 kg). Her temporal  temperature is 97.5 F (36.4 C) (abnormal). Her blood pressure is 167/86 (abnormal) and her pulse is 75. Her respiration is 18 and oxygen saturation is 97%.   Wt Readings from Last 3 Encounters:  05/01/19 247 lb 1.9 oz (112.1 kg)  09/29/18 251 lb (113.9 kg)  08/28/18 247 lb (112 kg)   Physical Exam Vitals reviewed.  HENT:     Head: Normocephalic and atraumatic.  Eyes:     Pupils: Pupils are equal, round, and reactive to light.  Cardiovascular:     Rate and Rhythm: Normal rate and regular rhythm.     Heart sounds: Normal heart sounds.  Pulmonary:     Effort: Pulmonary effort is normal.     Breath sounds: Normal breath sounds.  Abdominal:     General: Bowel sounds are normal.     Palpations: Abdomen is soft.  Musculoskeletal:        General:  No tenderness or deformity. Normal range of motion.     Cervical back: Normal range of motion.  Lymphadenopathy:     Cervical: No cervical adenopathy.  Skin:    General: Skin is warm and dry.     Findings: No erythema or rash.  Neurological:     Mental Status: She is alert and oriented to person, place, and time.  Psychiatric:        Behavior: Behavior normal.        Thought Content: Thought content normal.        Judgment: Judgment normal.      Lab Results  Component Value Date   WBC 6.2 05/01/2019   HGB 13.5 05/01/2019   HCT 42.9 05/01/2019   MCV 90.3 05/01/2019   PLT 143 (L) 05/01/2019   Lab Results  Component Value Date   FERRITIN 321 (H) 08/04/2018   IRON 71 08/04/2018   TIBC 258 08/04/2018   UIBC 187 08/04/2018   IRONPCTSAT 28 08/04/2018   Lab Results  Component Value Date   RETICCTPCT 1.4 05/01/2019   RBC 4.66 05/01/2019   No results found for: KPAFRELGTCHN, LAMBDASER, KAPLAMBRATIO Lab Results  Component Value Date   IGGSERUM 675 (L) 11/10/2016   IGMSERUM 86 11/10/2016   No results found for: Odetta Pink, SPEI   Chemistry      Component Value  Date/Time   NA 140 05/01/2019 0939   NA 144 01/27/2017 1302   NA 138 09/16/2016 1105   K 4.0 05/01/2019 0939   K 4.3 01/27/2017 1302   K 4.2 09/16/2016 1105   CL 100 05/01/2019 0939   CL 102 01/27/2017 1302   CO2 32 05/01/2019 0939   CO2 27 01/27/2017 1302   CO2 26 09/16/2016 1105   BUN 13 05/01/2019 0939   BUN 14 01/27/2017 1302   BUN 11.9 09/16/2016 1105   CREATININE 0.86 05/01/2019 0939   CREATININE 1.1 01/27/2017 1302   CREATININE 0.8 09/16/2016 1105      Component Value Date/Time   CALCIUM 10.0 05/01/2019 0939   CALCIUM 9.5 01/27/2017 1302   CALCIUM 9.0 09/16/2016 1105   ALKPHOS 97 05/01/2019 0939   ALKPHOS 116 (H) 01/27/2017 1302   ALKPHOS 136 09/16/2016 1105   AST 10 (L) 05/01/2019 0939   AST 8 09/16/2016 1105   ALT 13 05/01/2019 0939   ALT 25 01/27/2017 1302   ALT 12 09/16/2016 1105   BILITOT 0.7 05/01/2019 0939   BILITOT 0.32 09/16/2016 1105       Impression and Plan: Ms. Luciano is a very pleasant 49 yo with chronic phase CMLand history of iron deficiency anemia.  Clearly, from problem is going to be trying to stay on a tyrosine kinase inhibitor.  Again I know that this category of agents typically have a similar side effects.  Maybe, she will do okay on the Bosulif.  We are going to have to check to make sure she does not have a mutation with the T315I that might cause resistance to tyrosine kinase inhibitors.  I know this is complicated.  Again the diabetes does not help.  We can have to try to get her back in 6 weeks.  Again we will have to stay close in touch with her since we are making a change in her medications.  We spent about 40-45 minutes with her today.   [name], MD 3/2/202110:28 AM

## 2019-05-02 ENCOUNTER — Telehealth: Payer: Self-pay | Admitting: Pharmacist

## 2019-05-02 NOTE — Telephone Encounter (Signed)
Oral Oncology Pharmacist Encounter  Received new prescription for Bosulif (bosutinib) for the treatment of CML, planned duration until disease progression or unacceptable drug toxicity. Patient transitioning from Sprycel due to issue with tolerating the medication.  CMP from 05/01/19 assessed, no relevant lab abnormalities. Prescription dose and frequency assessed.   Current medication list in Epic reviewed, a few DDIs with bosutinib identified: -Maalox and omeprazole: may decrease the concentration of bosutinib. Omeprazole should be discontinued. Maalox should be separated from the bosutinib by 2 hours or more.  Prescription has been [facility]-scribed to the [facility] for benefits analysis and approval.  Oral Oncology Clinic will continue to follow for insurance authorization, copayment issues, initial counseling and start date.  Darl Pikes, PharmD, BCPS, [facility] Hematology/Oncology Clinical Pharmacist [facility]/AP Oral [facility] [phone]  05/02/2019 8:49 AM

## 2019-05-02 NOTE — Telephone Encounter (Signed)
Oral Chemotherapy Pharmacist Encounter  Medication scheduled to be delivered on Friday 05/04/19. She knows to get started when she receives the medication.  Patient Education I spoke with patient for overview of new oral chemotherapy medication: Bosulif (busutinib) for the treatment of CML, planned duration until disease progression or unacceptable drug toxicity.   Pt is doing well. Counseled patient on administration, dosing, side effects, monitoring, drug-food interactions, safe handling, storage, and disposal. Patient will take 4 tablets (400 mg total) by mouth daily with breakfast. Take with food.  Side effects include but not limited to: rash/ithcy skin, diarrhea, fatigue, N/V.    Reviewed with patient importance of keeping a medication schedule and plan for any missed doses.  Tabitha Estrada voiced understanding and appreciation. All questions answered. Medication handout placed in the mail.  Provided patient with [facility] [place] [facility] phone number. Patient knows to call the office with questions or concerns. Oral Chemotherapy Navigation Clinic will continue to follow.  Darl Pikes, PharmD, BCPS, [facility] Hematology/Oncology Clinical Pharmacist [facility]/AP Oral [facility] [phone]  05/02/2019 3:43 PM

## 2019-05-03 ENCOUNTER — Encounter: Payer: Self-pay | Admitting: Hematology & Oncology

## 2019-05-03 ENCOUNTER — Encounter: Payer: Self-pay | Admitting: *Deleted

## 2019-05-07 MED FILL — BOSULIF 100 MG TABLET: 100 | 30 days supply | Qty: 120 | Fill #0

## 2019-05-08 ENCOUNTER — Other Ambulatory Visit: Payer: Self-pay | Admitting: Family

## 2019-05-14 LAB — BCR/ABL

## 2019-05-21 ENCOUNTER — Telehealth: Payer: Self-pay | Admitting: *Deleted

## 2019-05-21 NOTE — Telephone Encounter (Signed)
Message received from patient stating that Bosulif is causing her to have diarrhea like the Sprycel.  Call placed back to patient and patient states that she has been having 3-4 loose stools a day that are not controlled with taking Imodium or Lomotil.  Dr. Marin Olp notified.  Pt instructed per order of Dr. Marin Olp to hold Bosulif for seven days.  After seven days, start Bosulif back at 100 mg daily times seven days, then increase to 200 mg daily times seven days, then increase to 300 mg daily times seven days, then increase to 400 mg daily times seven days.  Pt instructed to call office if diarrhea continues after holding Bosulif at any time.  Teach back done.  Pt appreciative of call back and has no further concerns or questions at this time.

## 2019-06-13 ENCOUNTER — Other Ambulatory Visit: Payer: Self-pay

## 2019-06-13 ENCOUNTER — Telehealth: Payer: Self-pay | Admitting: Family

## 2019-06-13 ENCOUNTER — Inpatient Hospital Stay (HOSPITAL_BASED_OUTPATIENT_CLINIC_OR_DEPARTMENT_OTHER): Payer: Medicaid Other | Admitting: Hematology & Oncology

## 2019-06-13 ENCOUNTER — Encounter: Payer: Self-pay | Admitting: Hematology & Oncology

## 2019-06-13 ENCOUNTER — Inpatient Hospital Stay: Payer: Medicaid Other | Attending: Hematology & Oncology

## 2019-06-13 VITALS — BP 129/73 | HR 68 | Temp 97.5°F | Resp 18 | Ht 65.0 in | Wt 246.8 lb

## 2019-06-13 DIAGNOSIS — D509 Iron deficiency anemia, unspecified: Secondary | ICD-10-CM | POA: Insufficient documentation

## 2019-06-13 DIAGNOSIS — C9210 Chronic myeloid leukemia, BCR/ABL-positive, not having achieved remission: Secondary | ICD-10-CM | POA: Diagnosis not present

## 2019-06-13 DIAGNOSIS — E119 Type 2 diabetes mellitus without complications: Secondary | ICD-10-CM | POA: Diagnosis not present

## 2019-06-13 DIAGNOSIS — Z79899 Other long term (current) drug therapy: Secondary | ICD-10-CM | POA: Diagnosis not present

## 2019-06-13 DIAGNOSIS — Z794 Long term (current) use of insulin: Secondary | ICD-10-CM | POA: Insufficient documentation

## 2019-06-13 LAB — CMP (CANCER CENTER ONLY)
ALT: 12 U/L (ref 0–44)
AST: 11 U/L — ABNORMAL LOW (ref 15–41)
Albumin: 3.8 g/dL (ref 3.5–5.0)
Alkaline Phosphatase: 101 U/L (ref 38–126)
Anion gap: 8 (ref 5–15)
BUN: 14 mg/dL (ref 6–20)
CO2: 31 mmol/L (ref 22–32)
Calcium: 9.4 mg/dL (ref 8.9–10.3)
Chloride: 102 mmol/L (ref 98–111)
Creatinine: 0.9 mg/dL (ref 0.44–1.00)
GFR, Est AFR Am: >60 mL/min (ref >60)
GFR, Estimated: >60 mL/min (ref >60)
Glucose, Bld: 175 mg/dL — ABNORMAL HIGH (ref 70–99)
Potassium: 4.1 mmol/L (ref 3.5–5.1)
Sodium: 141 mmol/L (ref 135–145)
Total Bilirubin: 0.4 mg/dL (ref 0.3–1.2)
Total Protein: 6 g/dL — ABNORMAL LOW (ref 6.5–8.1)

## 2019-06-13 LAB — CBC WITH DIFFERENTIAL (CANCER CENTER ONLY)
Abs Immature Granulocytes: 0.07 10*3/uL (ref 0.00–0.07)
Basophils Absolute: 0.1 10*3/uL (ref 0.0–0.1)
Basophils Relative: 1 %
Eosinophils Absolute: 0 10*3/uL (ref 0.0–0.5)
Eosinophils Relative: 0 %
HCT: 42.9 % (ref 36.0–46.0)
Hemoglobin: 13.7 g/dL (ref 12.0–15.0)
Immature Granulocytes: 1 %
Lymphocytes Relative: 19 %
Lymphs Abs: 1.7 10*3/uL (ref 0.7–4.0)
MCH: 28.7 pg (ref 26.0–34.0)
MCHC: 31.9 g/dL (ref 30.0–36.0)
MCV: 89.9 fL (ref 80.0–100.0)
Monocytes Absolute: 0.3 10*3/uL (ref 0.1–1.0)
Monocytes Relative: 3 %
Neutro Abs: 6.7 10*3/uL (ref 1.7–7.7)
Neutrophils Relative %: 76 %
Platelet Count: 180 10*3/uL (ref 150–400)
RBC: 4.77 MIL/uL (ref 3.87–5.11)
RDW: 14.4 % (ref 11.5–15.5)
WBC Count: 8.8 10*3/uL (ref 4.0–10.5)
nRBC: 0 % (ref 0.0–0.2)

## 2019-06-13 NOTE — Telephone Encounter (Signed)
Called and LMVM for patient regarding appointments added per 4/14 los

## 2019-06-13 NOTE — Progress Notes (Signed)
Hematology and Oncology Follow Up Visit  Tabitha Estrada [id number] Sep 02, 1970 49 y.o. 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  The last time I saw her, her BCR/ABL ratio was up to 18%.  As such, we had to change her over from Sprycel to Bosulif.  As always or as expected, she cannot tolerate full dose Bosulif.  At the full dose, she just had a lot of diarrhea.  She just has a hard time with all TKI agents.  I think that we should try a lower dose Bosulif.  I told her to take 200 mg a day.  Hopefully, this dose will be effective.  We will see what her BCR/ABL ratio is.  If, she cannot tolerate the Bosulif, I think that the FDA will approve a new category of agents for CML.  These agents are STAMP inhibitors.  The one that will be approved first will be Asciminib.  She is managing her diabetes better.  She had a nice Easter.  She did have a lot of ham.  However, she is trying her best to watch what she eats to keep her blood sugar under control.  She has had no cough.  She is had no headache.  Overall, her performance status is ECOG 1.    Medications:  Allergies as of 06/13/2019      Reactions   Dulaglutide Nausea And Vomiting, Other (See Comments)   Sulfur Nausea And Vomiting   Metformin Diarrhea      Medication List       Accurate as of June 13, 2019  1:11 PM. If you have any questions, ask your nurse or doctor.        albuterol 108 (90 Base) MCG/ACT inhaler Commonly known as: VENTOLIN HFA Inhale 1-2 puffs into the lungs every 4 (four) hours as needed for wheezing or shortness of breath.   albuterol (2.5 MG/3ML) 0.083% nebulizer solution Commonly  known as: PROVENTIL Inhale the contents of one vial in nebulizer every four to six hours as needed for cough or wheeze.   aluminum-magnesium hydroxide-simethicone [id number] MG/5ML Susp Commonly known as: MAALOX Take by mouth.   atorvastatin 80 MG tablet Commonly known as: LIPITOR TAKE 1 TABLET (80 MG TOTAL) BY MOUTH DAILY. NEEDS LAB WORK   bosutinib 100 MG tablet Commonly known as: BOSULIF Take 4 tablets (400 mg total) by mouth daily with breakfast. Take with food.   budesonide-formoterol 160-4.5 MCG/ACT inhaler Commonly known as: SYMBICORT Inhale 2 puffs into the lungs 2 (two) times daily.   clobetasol ointment 0.05 % Commonly known as: TEMOVATE Apply 1 application topically 2 (two) times daily.   diphenoxylate-atropine 2.5-0.025 MG tablet Commonly known as: LOMOTIL Take 1 tablet by mouth 4 (four) times daily as needed for diarrhea or loose stools (as needed after taking Sprycel for diarrhea).   fluticasone 50 MCG/ACT nasal spray Commonly known as: FLONASE Use two sprays in each nostril once daily as directed.   furosemide 40 MG tablet Commonly known as: LASIX Take 1 pill daily for 3 days for facial swelling.  Then take daily as needed.   insulin aspart 100 UNIT/ML FlexPen Commonly known as:  NovoLOG FlexPen Inject 40 Units into the skin daily with supper. What changed:   how much to take  additional instructions   insulin glargine 100 UNIT/ML Solostar Pen Commonly known as: Lantus SoloStar Inject 140 Units into the skin daily. And pen needles 5/day What changed: how much to take   ketoconazole 2 % cream Commonly known as: NIZORAL Apply topically 2 (two) times a day.   meclizine 25 MG tablet Commonly known as: ANTIVERT Take 1 tablet (25 mg total) by mouth 3 (three) times daily as needed for dizziness.   meloxicam 7.5 MG tablet Commonly known as: MOBIC Take 7.5 mg by mouth daily.   montelukast 10 MG tablet Commonly known as: SINGULAIR TAKE 1 TABLET (10  MG TOTAL) BY MOUTH DAILY   omeprazole 40 MG capsule Commonly known as: PRILOSEC Take 40 mg by mouth daily.   pregabalin 200 MG capsule Commonly known as: LYRICA TAKE 1 CAPSULE BY MOUTH 2 TIMES DAILY.   valACYclovir 500 MG tablet Commonly known as: VALTREX TAKE ONE TABLET BY MOUTH AT BEDTIME       Allergies:  Allergies  Allergen Reactions  . Dulaglutide Nausea And Vomiting and Other (See Comments)  . Sulfur Nausea And Vomiting  . Metformin Diarrhea    Past Medical History, Surgical history, Social history, and Family History were reviewed and updated.  Review of Systems: Review of Systems  Constitutional: Positive for malaise/fatigue.  HENT: Positive for congestion.   Eyes: Negative.   Respiratory: Positive for cough.   Cardiovascular: Negative.   Gastrointestinal: Positive for abdominal pain and diarrhea.  Genitourinary: Negative.   Musculoskeletal: Positive for joint pain.  Skin: Negative.   Neurological: Positive for tingling.  Endo/Heme/Allergies: Negative.   Psychiatric/Behavioral: Negative.      Physical Exam:  height is 5\' 5"  (1.651 m) and weight is 246 lb 12.8 oz (111.9 kg). Her temporal temperature is 97.5 F (36.4 C) (abnormal). Her blood pressure is 129/73 and her pulse is 68. Her respiration is 18 and oxygen saturation is 99%.   Wt Readings from Last 3 Encounters:  06/13/19 246 lb 12.8 oz (111.9 kg)  05/01/19 247 lb 1.9 oz (112.1 kg)  09/29/18 251 lb (113.9 kg)   Physical Exam Vitals reviewed.  HENT:     Head: Normocephalic and atraumatic.  Eyes:     Pupils: Pupils are equal, round, and reactive to light.  Cardiovascular:     Rate and Rhythm: Normal rate and regular rhythm.     Heart sounds: Normal heart sounds.  Pulmonary:     Effort: Pulmonary effort is normal.     Breath sounds: Normal breath sounds.  Abdominal:     General: Bowel sounds are normal.     Palpations: Abdomen is soft.  Musculoskeletal:        General: No tenderness or  deformity. Normal range of motion.     Cervical back: Normal range of motion.  Lymphadenopathy:     Cervical: No cervical adenopathy.  Skin:    General: Skin is warm and dry.     Findings: No erythema or rash.  Neurological:     Mental Status: She is alert and oriented to person, place, and time.  Psychiatric:        Behavior: Behavior normal.        Thought Content: Thought content normal.        Judgment: Judgment normal.      Lab Results  Component Value Date   WBC 8.8 06/13/2019  HGB 13.7 06/13/2019   HCT 42.9 06/13/2019   MCV 89.9 06/13/2019   PLT 180 06/13/2019   Lab Results  Component Value Date   FERRITIN 119 05/01/2019   IRON 77 05/01/2019   TIBC 286 05/01/2019   UIBC 209 05/01/2019   IRONPCTSAT 27 05/01/2019   Lab Results  Component Value Date   RETICCTPCT 1.4 05/01/2019   RBC 4.77 06/13/2019   No results found for: KPAFRELGTCHN, LAMBDASER, [facility] Lab Results  Component Value Date   IGGSERUM 675 (L) 11/10/2016   IGMSERUM 86 11/10/2016   No results found for: Kathrynn Ducking, MSPIKE, SPEI   Chemistry      Component Value Date/Time   NA 141 06/13/2019 1203   NA 144 01/27/2017 1302   NA 138 09/16/2016 1105   K 4.1 06/13/2019 1203   K 4.3 01/27/2017 1302   K 4.2 09/16/2016 1105   CL 102 06/13/2019 1203   CL 102 01/27/2017 1302   CO2 31 06/13/2019 1203   CO2 27 01/27/2017 1302   CO2 26 09/16/2016 1105   BUN 14 06/13/2019 1203   BUN 14 01/27/2017 1302   BUN 11.9 09/16/2016 1105   CREATININE 0.90 06/13/2019 1203   CREATININE 1.1 01/27/2017 1302   CREATININE 0.8 09/16/2016 1105      Component Value Date/Time   CALCIUM 9.4 06/13/2019 1203   CALCIUM 9.5 01/27/2017 1302   CALCIUM 9.0 09/16/2016 1105   ALKPHOS 101 06/13/2019 1203   ALKPHOS 116 (H) 01/27/2017 1302   ALKPHOS 136 09/16/2016 1105   AST 11 (L) 06/13/2019 1203   AST 8 09/16/2016 1105   ALT 12 06/13/2019 1203   ALT 25 01/27/2017 1302     ALT 12 09/16/2016 1105   BILITOT 0.4 06/13/2019 1203   BILITOT 0.32 09/16/2016 1105       Impression and Plan: Ms. Makin is a very pleasant 49 yo with chronic phase CMLand history of iron deficiency anemia.  Clearly, I think that her problem is going to be trying to stay on a tyrosine kinase inhibitor, and it to be effective.  Again, hopefully the lower dose Bosulif will work.  The BCR/ABL ratio will show [place].  I would have to think that given that her white cell count is normal and that she has normal white cell differential and that her blood smear under the microscope looks okay, I will have to believe that she is going to respond.  I would like to get her back to see [place] in another 6 weeks.  I still think we have to watch her closely given the side effects that she is having.  [name], MD 4/14/20211:11 PM

## 2019-06-14 LAB — LACTATE DEHYDROGENASE: LDH: 253 U/L — ABNORMAL HIGH (ref 98–192)

## 2019-06-19 ENCOUNTER — Other Ambulatory Visit: Payer: Self-pay | Admitting: Family Medicine

## 2019-06-19 DIAGNOSIS — Z1231 Encounter for screening mammogram for malignant neoplasm of breast: Secondary | ICD-10-CM

## 2019-06-20 ENCOUNTER — Ambulatory Visit (INDEPENDENT_AMBULATORY_CARE_PROVIDER_SITE_OTHER): Payer: Medicaid Other

## 2019-06-20 ENCOUNTER — Other Ambulatory Visit: Payer: Self-pay

## 2019-06-20 DIAGNOSIS — Z1231 Encounter for screening mammogram for malignant neoplasm of breast: Secondary | ICD-10-CM

## 2019-06-26 ENCOUNTER — Encounter: Payer: Self-pay | Admitting: Family

## 2019-06-27 ENCOUNTER — Other Ambulatory Visit: Payer: Self-pay | Admitting: Hematology & Oncology

## 2019-07-06 ENCOUNTER — Other Ambulatory Visit: Payer: Self-pay | Admitting: Hematology & Oncology

## 2019-07-06 LAB — BCR/ABL

## 2019-07-25 ENCOUNTER — Inpatient Hospital Stay: Payer: Medicaid Other | Attending: Hematology & Oncology

## 2019-07-25 ENCOUNTER — Encounter: Payer: Self-pay | Admitting: Family

## 2019-07-25 ENCOUNTER — Inpatient Hospital Stay (HOSPITAL_BASED_OUTPATIENT_CLINIC_OR_DEPARTMENT_OTHER): Payer: Medicaid Other | Admitting: Family

## 2019-07-25 ENCOUNTER — Telehealth: Payer: Self-pay | Admitting: Family

## 2019-07-25 ENCOUNTER — Other Ambulatory Visit: Payer: Self-pay

## 2019-07-25 VITALS — BP 139/67 | HR 74 | Temp 98.0°F | Resp 18 | Wt 254.0 lb

## 2019-07-25 DIAGNOSIS — R200 Anesthesia of skin: Secondary | ICD-10-CM | POA: Insufficient documentation

## 2019-07-25 DIAGNOSIS — C9210 Chronic myeloid leukemia, BCR/ABL-positive, not having achieved remission: Secondary | ICD-10-CM | POA: Insufficient documentation

## 2019-07-25 DIAGNOSIS — K521 Toxic gastroenteritis and colitis: Secondary | ICD-10-CM | POA: Insufficient documentation

## 2019-07-25 DIAGNOSIS — R202 Paresthesia of skin: Secondary | ICD-10-CM | POA: Diagnosis not present

## 2019-07-25 DIAGNOSIS — T451X5A Adverse effect of antineoplastic and immunosuppressive drugs, initial encounter: Secondary | ICD-10-CM | POA: Diagnosis not present

## 2019-07-25 DIAGNOSIS — R109 Unspecified abdominal pain: Secondary | ICD-10-CM | POA: Diagnosis not present

## 2019-07-25 DIAGNOSIS — D509 Iron deficiency anemia, unspecified: Secondary | ICD-10-CM | POA: Diagnosis not present

## 2019-07-25 LAB — CBC WITH DIFFERENTIAL (CANCER CENTER ONLY)
Abs Immature Granulocytes: 0.54 10*3/uL — ABNORMAL HIGH (ref 0.00–0.07)
Basophils Absolute: 0.2 10*3/uL — ABNORMAL HIGH (ref 0.0–0.1)
Basophils Relative: 2 %
Eosinophils Absolute: 0.1 10*3/uL (ref 0.0–0.5)
Eosinophils Relative: 1 %
HCT: 41.7 % (ref 36.0–46.0)
Hemoglobin: 13.4 g/dL (ref 12.0–15.0)
Immature Granulocytes: 4 %
Lymphocytes Relative: 15 %
Lymphs Abs: 2 10*3/uL (ref 0.7–4.0)
MCH: 29.2 pg (ref 26.0–34.0)
MCHC: 32.1 g/dL (ref 30.0–36.0)
MCV: 90.8 fL (ref 80.0–100.0)
Monocytes Absolute: 0.4 10*3/uL (ref 0.1–1.0)
Monocytes Relative: 3 %
Neutro Abs: 10.6 10*3/uL — ABNORMAL HIGH (ref 1.7–7.7)
Neutrophils Relative %: 75 %
Platelet Count: 225 10*3/uL (ref 150–400)
RBC: 4.59 MIL/uL (ref 3.87–5.11)
RDW: 15.1 % (ref 11.5–15.5)
WBC Count: 13.9 10*3/uL — ABNORMAL HIGH (ref 4.0–10.5)
nRBC: 0 % (ref 0.0–0.2)

## 2019-07-25 LAB — CMP (CANCER CENTER ONLY)
ALT: 11 U/L (ref 0–44)
AST: 9 U/L — ABNORMAL LOW (ref 15–41)
Albumin: 3.8 g/dL (ref 3.5–5.0)
Alkaline Phosphatase: 100 U/L (ref 38–126)
Anion gap: 7 (ref 5–15)
BUN: 15 mg/dL (ref 6–20)
CO2: 31 mmol/L (ref 22–32)
Calcium: 8.9 mg/dL (ref 8.9–10.3)
Chloride: 103 mmol/L (ref 98–111)
Creatinine: 0.83 mg/dL (ref 0.44–1.00)
GFR, Est AFR Am: >60 mL/min (ref >60)
GFR, Estimated: >60 mL/min (ref >60)
Glucose, Bld: 124 mg/dL — ABNORMAL HIGH (ref 70–99)
Potassium: 3.7 mmol/L (ref 3.5–5.1)
Sodium: 141 mmol/L (ref 135–145)
Total Bilirubin: 0.5 mg/dL (ref 0.3–1.2)
Total Protein: 6.4 g/dL — ABNORMAL LOW (ref 6.5–8.1)

## 2019-07-25 LAB — LACTATE DEHYDROGENASE: LDH: 274 U/L — ABNORMAL HIGH (ref 98–192)

## 2019-07-25 LAB — SAVE SMEAR(SSMR), FOR PROVIDER SLIDE REVIEW

## 2019-07-25 NOTE — Progress Notes (Signed)
Hematology and Oncology Follow Up Visit  ANJANNETTE WEYER [id number] 1970-12-22 49 y.o. 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 Fritchman is here today for follow-up. She is currently off Bosulif due to diarrhea.  She has had some mild fatigue and has noted hot flashes and night sweats over the last few days.  BCR-ABL in April was up a bit at 31%.  She has had bronchitis with a cough. She states that she has been symptomatic with sore chest and SOB with this.  No fever, chills, n/v, rash, dizziness, chest pain, palpitations or changes in bowel or bladder habits.  No swelling or tenderness in her extremities.  The numbness and tingling in her right foot is unchanged.  No falls or syncopal episode.  She has a good appetite but noted abdominal discomfort after eating. She is staying well hydrated throughout the day. Her weight is stable.  No episodes of bleeding. No bruising or petechiae.   ECOG Performance Status: 1 - Symptomatic but completely ambulatory  Medications:  Allergies as of 07/25/2019      Reactions   Dulaglutide Nausea And Vomiting, Other (See Comments)   Sulfur Nausea And Vomiting   Metformin Diarrhea      Medication List       Accurate as of Jul 25, 2019 11:22 AM. If you have any questions, ask your nurse or doctor.        albuterol 108 (90 Base) MCG/ACT inhaler Commonly known as: VENTOLIN HFA Inhale 1-2 puffs into the lungs every 4 (four) hours as needed for wheezing or shortness of breath.   albuterol (2.5 MG/3ML) 0.083% nebulizer solution Commonly known as: PROVENTIL Inhale the contents of one vial in nebulizer every  four to six hours as needed for cough or wheeze.   aluminum-magnesium hydroxide-simethicone [id number] MG/5ML Susp Commonly known as: MAALOX Take by mouth.   atorvastatin 80 MG tablet Commonly known as: LIPITOR TAKE 1 TABLET (80 MG TOTAL) BY MOUTH DAILY. NEEDS LAB WORK   bosutinib 100 MG tablet Commonly known as: BOSULIF Take 4 tablets (400 mg total) by mouth daily with breakfast. Take with food.   budesonide-formoterol 160-4.5 MCG/ACT inhaler Commonly known as: SYMBICORT Inhale 2 puffs into the lungs 2 (two) times daily.   buPROPion 75 MG tablet Commonly known as: WELLBUTRIN Take by mouth.   clobetasol ointment 0.05 % Commonly known as: TEMOVATE Apply 1 application topically 2 (two) times daily.   diphenoxylate-atropine 2.5-0.025 MG tablet Commonly known as: LOMOTIL Take 1 tablet by mouth 4 (four) times daily as needed for diarrhea or loose stools (as needed after taking Sprycel for diarrhea).   doxycycline 100 MG tablet Commonly known as: VIBRA-TABS Take by mouth.   fluticasone 50 MCG/ACT nasal spray Commonly known as: FLONASE Use two sprays in each nostril once daily as directed.   furosemide 40 MG tablet Commonly known as: LASIX Take 1 pill daily for 3 days for facial swelling.  Then take daily as needed.   insulin aspart 100 UNIT/ML FlexPen Commonly known as: NovoLOG FlexPen Inject 40 Units into the skin daily with supper. What changed:  how much to take  additional instructions   insulin glargine 100 UNIT/ML Solostar Pen Commonly known as: Lantus SoloStar Inject 140 Units into the skin daily. And pen needles 5/day What changed: how much to take   ketoconazole 2 % cream Commonly known as: NIZORAL Apply topically 2 (two) times a day.   meclizine 25 MG tablet Commonly known as: ANTIVERT Take 1 tablet (25 mg total) by mouth 3 (three) times daily as needed for dizziness.   meloxicam 7.5 MG tablet Commonly known as: MOBIC Take 7.5 mg by mouth  daily.   montelukast 10 MG tablet Commonly known as: SINGULAIR TAKE 1 TABLET (10 MG TOTAL) BY MOUTH DAILY   omeprazole 40 MG capsule Commonly known as: PRILOSEC Take 40 mg by mouth daily.   OmniPod Dash 5 Pack Pods Misc Inject into the skin.   predniSONE 20 MG tablet Commonly known as: DELTASONE Take by mouth.   pregabalin 200 MG capsule Commonly known as: LYRICA TAKE 1 CAPSULE BY MOUTH 2 TIMES DAILY.   valACYclovir 500 MG tablet Commonly known as: VALTREX TAKE ONE TABLET BY MOUTH AT BEDTIME   valsartan 320 MG tablet Commonly known as: DIOVAN Take by mouth.       Allergies:  Allergies  Allergen Reactions  . Dulaglutide Nausea And Vomiting and Other (See Comments)  . Sulfur Nausea And Vomiting  . Metformin Diarrhea    Past Medical History, Surgical history, Social history, and Family History were reviewed and updated.  Review of Systems: All other 10 point review of systems is negative.   Physical Exam:  weight is 254 lb (115.2 kg). Her temporal temperature is 98 F (36.7 C). Her blood pressure is 139/67 and her pulse is 74. Her respiration is 18 and oxygen saturation is 98%.   Wt Readings from Last 3 Encounters:  07/25/19 254 lb (115.2 kg)  06/13/19 246 lb 12.8 oz (111.9 kg)  05/01/19 247 lb 1.9 oz (112.1 kg)    Ocular: Sclerae unicteric, pupils equal, round and reactive to light Ear-nose-throat: Oropharynx clear, dentition fair Lymphatic: No cervical or supraclavicular adenopathy Lungs no rales or rhonchi, good excursion bilaterally Heart regular rate and rhythm, no murmur appreciated Abd soft, nontender, positive bowel sounds, no liver or spleen tip palpated on exam, no fluid wave  MSK no focal spinal tenderness, no joint edema Neuro: non-focal, well-oriented, appropriate affect Breasts: Deferred   Lab Results  Component Value Date   WBC 13.9 (H) 07/25/2019   HGB 13.4 07/25/2019   HCT 41.7 07/25/2019   MCV 90.8 07/25/2019   PLT 225 07/25/2019    Lab Results  Component Value Date   FERRITIN 119 05/01/2019   IRON 77 05/01/2019   TIBC 286 05/01/2019   UIBC 209 05/01/2019   IRONPCTSAT 27 05/01/2019   Lab Results  Component Value Date   RETICCTPCT 1.4 05/01/2019   RBC 4.59 07/25/2019   No results found for: KPAFRELGTCHN, LAMBDASER, KAPLAMBRATIO Lab Results  Component Value Date   IGGSERUM 675 (L) 11/10/2016   IGMSERUM 86 11/10/2016   No results found for: Ronnald Ramp, A1GS, Nelida Meuse, SPEI   Chemistry      Component Value Date/Time   NA 141 07/25/2019 1031   NA 144 01/27/2017 1302   NA 138 09/16/2016 1105   K 3.7 07/25/2019 1031   K 4.3 01/27/2017 1302   K 4.2 09/16/2016 1105   CL 103 07/25/2019 1031   CL 102 01/27/2017 1302   CO2 31 07/25/2019 1031  CO2 27 01/27/2017 1302   CO2 26 09/16/2016 1105   BUN 15 07/25/2019 1031   BUN 14 01/27/2017 1302   BUN 11.9 09/16/2016 1105   CREATININE 0.83 07/25/2019 1031   CREATININE 1.1 01/27/2017 1302   CREATININE 0.8 09/16/2016 1105      Component Value Date/Time   CALCIUM 8.9 07/25/2019 1031   CALCIUM 9.5 01/27/2017 1302   CALCIUM 9.0 09/16/2016 1105   ALKPHOS 100 07/25/2019 1031   ALKPHOS 116 (H) 01/27/2017 1302   ALKPHOS 136 09/16/2016 1105   AST 9 (L) 07/25/2019 1031   AST 8 09/16/2016 1105   ALT 11 07/25/2019 1031   ALT 25 01/27/2017 1302   ALT 12 09/16/2016 1105   BILITOT 0.5 07/25/2019 1031   BILITOT 0.32 09/16/2016 1105       Impression and Plan: Ms. Fagundo is a very pleasant 49 yo with chronic phase CMLand history of iron deficiency anemia. She is currently off treatment with Bosulif due to diarrhea.  I spoke with Dr. Marin Olp and we will see what her BCR-ABL looks like and then determine how to proceed with treatment.  Asciminib is still waiting on approval with the FDA.  We will go ahead and plan to see her back in 6 weeks.  She will contact our office with any questions or concerns. We can certainly see her  sooner if needed.   Laverna [name], NP 5/26/202111:22 AM

## 2019-07-25 NOTE — Telephone Encounter (Signed)
Tried calling patient.  Unable to [facility] calendar was printed & mailed per 5/26 los

## 2019-08-03 ENCOUNTER — Other Ambulatory Visit: Payer: Self-pay | Admitting: Hematology & Oncology

## 2019-08-08 ENCOUNTER — Encounter: Payer: Self-pay | Admitting: Family

## 2019-08-10 ENCOUNTER — Encounter: Payer: Self-pay | Admitting: *Deleted

## 2019-08-14 LAB — BCR/ABL

## 2019-09-05 ENCOUNTER — Other Ambulatory Visit: Payer: Self-pay

## 2019-09-05 ENCOUNTER — Telehealth: Payer: Self-pay | Admitting: Family

## 2019-09-05 ENCOUNTER — Inpatient Hospital Stay: Payer: Medicaid Other | Attending: Hematology & Oncology

## 2019-09-05 ENCOUNTER — Inpatient Hospital Stay (HOSPITAL_BASED_OUTPATIENT_CLINIC_OR_DEPARTMENT_OTHER): Payer: Medicaid Other | Admitting: Family

## 2019-09-05 ENCOUNTER — Encounter: Payer: Self-pay | Admitting: Family

## 2019-09-05 VITALS — BP 121/62 | HR 77 | Temp 98.2°F | Resp 17 | Ht 65.0 in | Wt 258.7 lb

## 2019-09-05 DIAGNOSIS — D509 Iron deficiency anemia, unspecified: Secondary | ICD-10-CM | POA: Diagnosis not present

## 2019-09-05 DIAGNOSIS — G629 Polyneuropathy, unspecified: Secondary | ICD-10-CM | POA: Diagnosis not present

## 2019-09-05 DIAGNOSIS — C9210 Chronic myeloid leukemia, BCR/ABL-positive, not having achieved remission: Secondary | ICD-10-CM

## 2019-09-05 DIAGNOSIS — R5383 Other fatigue: Secondary | ICD-10-CM | POA: Diagnosis not present

## 2019-09-05 DIAGNOSIS — R0609 Other forms of dyspnea: Secondary | ICD-10-CM | POA: Insufficient documentation

## 2019-09-05 DIAGNOSIS — Z79899 Other long term (current) drug therapy: Secondary | ICD-10-CM | POA: Diagnosis not present

## 2019-09-05 LAB — LACTATE DEHYDROGENASE: LDH: 325 U/L — ABNORMAL HIGH (ref 98–192)

## 2019-09-05 LAB — CMP (CANCER CENTER ONLY)
ALT: 11 U/L (ref 0–44)
AST: 10 U/L — ABNORMAL LOW (ref 15–41)
Albumin: 4 g/dL (ref 3.5–5.0)
Alkaline Phosphatase: 92 U/L (ref 38–126)
Anion gap: 7 (ref 5–15)
BUN: 15 mg/dL (ref 6–20)
CO2: 33 mmol/L — ABNORMAL HIGH (ref 22–32)
Calcium: 9.8 mg/dL (ref 8.9–10.3)
Chloride: 101 mmol/L (ref 98–111)
Creatinine: 1.14 mg/dL — ABNORMAL HIGH (ref 0.44–1.00)
GFR, Est AFR Am: >60 mL/min (ref >60)
GFR, Estimated: 57 mL/min — ABNORMAL LOW (ref >60)
Glucose, Bld: 139 mg/dL — ABNORMAL HIGH (ref 70–99)
Potassium: 3.9 mmol/L (ref 3.5–5.1)
Sodium: 141 mmol/L (ref 135–145)
Total Bilirubin: 0.5 mg/dL (ref 0.3–1.2)
Total Protein: 6.5 g/dL (ref 6.5–8.1)

## 2019-09-05 LAB — CBC WITH DIFFERENTIAL (CANCER CENTER ONLY)
Abs Immature Granulocytes: 1.77 10*3/uL — ABNORMAL HIGH (ref 0.00–0.07)
Basophils Absolute: 0.6 10*3/uL — ABNORMAL HIGH (ref 0.0–0.1)
Basophils Relative: 3 %
Eosinophils Absolute: 0.2 10*3/uL (ref 0.0–0.5)
Eosinophils Relative: 1 %
HCT: 41 % (ref 36.0–46.0)
Hemoglobin: 12.8 g/dL (ref 12.0–15.0)
Immature Granulocytes: 8 %
Lymphocytes Relative: 12 %
Lymphs Abs: 2.8 10*3/uL (ref 0.7–4.0)
MCH: 29 pg (ref 26.0–34.0)
MCHC: 31.2 g/dL (ref 30.0–36.0)
MCV: 92.8 fL (ref 80.0–100.0)
Monocytes Absolute: 0.8 10*3/uL (ref 0.1–1.0)
Monocytes Relative: 4 %
Neutro Abs: 16.9 10*3/uL — ABNORMAL HIGH (ref 1.7–7.7)
Neutrophils Relative %: 72 %
Platelet Count: 380 10*3/uL (ref 150–400)
RBC: 4.42 MIL/uL (ref 3.87–5.11)
RDW: 16.5 % — ABNORMAL HIGH (ref 11.5–15.5)
WBC Count: 23 10*3/uL — ABNORMAL HIGH (ref 4.0–10.5)
nRBC: 0.1 % (ref 0.0–0.2)

## 2019-09-05 LAB — SAVE SMEAR(SSMR), FOR PROVIDER SLIDE REVIEW

## 2019-09-05 NOTE — Telephone Encounter (Signed)
Appointments scheduled LMVM for patient with date/time per 7/7 los

## 2019-09-05 NOTE — Progress Notes (Signed)
Hematology and Oncology Follow Up Visit  Tabitha Estrada [id number] 01-Oct-1970 49 y.o. 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 She is feeling fatigued. She has been off treatment since April 2021 due to side effects.  Her BCR-ABL in May/June was up to 61%. WBC count today is 23, Hgb 12.8 and platelets 380. We went over her blood work in detail and discussed trying Hydrea now. She She has occasional episodes of SOB mostly with exertion. She takes a break to rest when needed.  She also has occasional night sweats.  No fever, chills, n/v, rash, dizziness, SOB, chest pain, palpitations, abdominal pain or changes in bowel or bladder habits.  She has had issues with fluid retention and states that she has an appointment with her cardiologist for work up for CHF.  No lower extremity edema noted at this time.  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  No falls or syncopal episodes to report.  She states that she is eating well and staying hydrated. Her weight is stable.   ECOG Performance Status: 1 - Symptomatic but completely ambulatory  Medications:  Allergies as of 09/05/2019      Reactions   Dulaglutide Nausea And Vomiting, Other (See Comments)   Sulfur Nausea And Vomiting   Metformin Diarrhea      Medication List       Accurate as of September 05, 2019 11:29 AM. If you have any questions, ask your nurse or doctor.        albuterol 108 (90 Base) MCG/ACT inhaler Commonly known as: VENTOLIN HFA Inhale 1-2 puffs into the lungs every 4 (four) hours  as needed for wheezing or shortness of breath.   albuterol (2.5 MG/3ML) 0.083% nebulizer solution Commonly known as: PROVENTIL Inhale the contents of one vial in nebulizer every four to six hours as needed for cough or wheeze.   aluminum-magnesium hydroxide-simethicone 196-222-97 MG/5ML Susp Commonly known as: MAALOX Take by mouth.   atorvastatin 80 MG tablet Commonly known as: LIPITOR TAKE 1 TABLET (80 MG TOTAL) BY MOUTH DAILY. NEEDS LAB WORK   bosutinib 100 MG tablet Commonly known as: BOSULIF Take 4 tablets (400 mg total) by mouth daily with breakfast. Take with food.   budesonide-formoterol 160-4.5 MCG/ACT inhaler Commonly known as: SYMBICORT Inhale 2 puffs into the lungs 2 (two) times daily.   buPROPion 75 MG tablet Commonly known as: WELLBUTRIN Take by mouth.   clobetasol ointment 0.05 % Commonly known as: TEMOVATE Apply 1 application topically 2 (two) times daily.   diphenoxylate-atropine 2.5-0.025 MG tablet Commonly known as: LOMOTIL Take 1 tablet by mouth 4 (four) times daily as needed for diarrhea or loose stools (as needed after taking Sprycel for diarrhea).   fluticasone 50 MCG/ACT nasal spray Commonly known as: FLONASE Use two sprays in each nostril once daily as directed.   furosemide 40 MG tablet Commonly known as: LASIX TAKE ONE TABLET BY MOUTH DAILY FOR 3 DAYS FOR FACIAL SWELLING THEN TAKE DAILY AS NEEDED   insulin aspart 100  UNIT/ML FlexPen Commonly known as: NovoLOG FlexPen Inject 40 Units into the skin daily with supper. What changed:   how much to take  additional instructions   insulin glargine 100 UNIT/ML Solostar Pen Commonly known as: Lantus SoloStar Inject 140 Units into the skin daily. And pen needles 5/day What changed: how much to take   ketoconazole 2 % cream Commonly known as: NIZORAL Apply topically 2 (two) times a day.   meclizine 25 MG tablet Commonly known as: ANTIVERT Take 1 tablet (25 mg total) by mouth 3 (three) times  daily as needed for dizziness.   meloxicam 7.5 MG tablet Commonly known as: MOBIC Take 7.5 mg by mouth daily.   montelukast 10 MG tablet Commonly known as: SINGULAIR TAKE 1 TABLET (10 MG TOTAL) BY MOUTH DAILY   omeprazole 40 MG capsule Commonly known as: PRILOSEC Take 40 mg by mouth daily.   OmniPod Dash 5 Pack Pods Misc Inject into the skin.   pregabalin 200 MG capsule Commonly known as: LYRICA TAKE 1 CAPSULE BY MOUTH 2 TIMES DAILY.   valACYclovir 500 MG tablet Commonly known as: VALTREX TAKE ONE TABLET BY MOUTH AT BEDTIME   valsartan 320 MG tablet Commonly known as: DIOVAN Take by mouth.       Allergies:  Allergies  Allergen Reactions  . Dulaglutide Nausea And Vomiting and Other (See Comments)  . Sulfur Nausea And Vomiting  . Metformin Diarrhea    Past Medical History, Surgical history, Social history, and Family History were reviewed and updated.  Review of Systems: All other 10 point review of systems is negative.   Physical Exam:  vitals were not taken for this visit.   Wt Readings from Last 3 Encounters:  07/25/19 254 lb (115.2 kg)  06/13/19 246 lb 12.8 oz (111.9 kg)  05/01/19 247 lb 1.9 oz (112.1 kg)    Ocular: Sclerae unicteric, pupils equal, round and reactive to light Ear-nose-throat: Oropharynx clear, dentition fair Lymphatic: No cervical or supraclavicular adenopathy Lungs no rales or rhonchi, good excursion bilaterally Heart regular rate and rhythm, no murmur appreciated Abd soft, nontender, positive bowel sounds, no liver or spleen tip palpated on exam, no fluid wave  MSK no focal spinal tenderness, no joint edema Neuro: non-focal, well-oriented, appropriate affect Breasts: Deferred   Lab Results  Component Value Date   WBC 23.0 (H) 09/05/2019   HGB 12.8 09/05/2019   HCT 41.0 09/05/2019   MCV 92.8 09/05/2019   PLT 380 09/05/2019   Lab Results  Component Value Date   FERRITIN 119 05/01/2019   IRON 77 05/01/2019   TIBC 286  05/01/2019   UIBC 209 05/01/2019   IRONPCTSAT 27 05/01/2019   Lab Results  Component Value Date   RETICCTPCT 1.4 05/01/2019   RBC 4.42 09/05/2019   No results found for: KPAFRELGTCHN, LAMBDASER, KAPLAMBRATIO Lab Results  Component Value Date   IGGSERUM 675 (L) 11/10/2016   IGMSERUM 86 11/10/2016   No results found for: Odetta Pink, SPEI   Chemistry      Component Value Date/Time   NA 141 09/05/2019 1044   NA 144 01/27/2017 1302   NA 138 09/16/2016 1105   K 3.9 09/05/2019 1044   K 4.3 01/27/2017 1302   K 4.2 09/16/2016 1105   CL 101 09/05/2019 1044   CL 102 01/27/2017 1302   CO2 33 (H) 09/05/2019 1044   CO2 27 01/27/2017 1302   CO2 26 09/16/2016 1105   BUN 15 09/05/2019 1044  BUN 14 01/27/2017 1302   BUN 11.9 09/16/2016 1105   CREATININE 1.14 (H) 09/05/2019 1044   CREATININE 1.1 01/27/2017 1302   CREATININE 0.8 09/16/2016 1105      Component Value Date/Time   CALCIUM 9.8 09/05/2019 1044   CALCIUM 9.5 01/27/2017 1302   CALCIUM 9.0 09/16/2016 1105   ALKPHOS 92 09/05/2019 1044   ALKPHOS 116 (H) 01/27/2017 1302   ALKPHOS 136 09/16/2016 1105   AST 10 (L) 09/05/2019 1044   AST 8 09/16/2016 1105   ALT 11 09/05/2019 1044   ALT 25 01/27/2017 1302   ALT 12 09/16/2016 1105   BILITOT 0.5 09/05/2019 1044   BILITOT 0.32 09/16/2016 1105       Impression and Plan: Tabitha Estrada is a very pleasant 49 yo with chronic phase CMLand history of iron deficiency anemia. She is currently off treatment with Bosulif due to diarrhea.  I spoke with Dr. Marin Olp and we will now try her on Hydrea. He will place the initial order.  She is in agreement with the plan and will contact our office with any questions or concerns.  We will go ahead and plan to see her back in 5 weeks.   Laverna [name], NP 7/7/202111:29 AM

## 2019-09-06 ENCOUNTER — Other Ambulatory Visit: Payer: Self-pay | Admitting: Hematology & Oncology

## 2019-09-06 MED ORDER — HYDROXYUREA 500 MG PO CAPS
500.0000 mg | ORAL_CAPSULE | Freq: Two times a day (BID) | ORAL | 6 refills | Status: DC
Start: 2019-09-06 — End: 2019-12-21

## 2019-09-17 LAB — BCR/ABL

## 2019-09-18 ENCOUNTER — Telehealth: Payer: Self-pay | Admitting: *Deleted

## 2019-09-18 NOTE — Telephone Encounter (Signed)
Per Laverna [name], NP, I called patient and left a voice mail message asking her to call the office and let us know if she had started taking Hydrea?

## 2019-09-18 NOTE — Telephone Encounter (Signed)
-----   Message from Eliezer Bottom, NP sent at 09/18/2019  2:21 PM EDT ----- She is around 60% but had not been on anything for a while when this was drawn. Has she started the Hydrea yet? This was ordered on 09/05/2019.   Sarah  ----- Message ----- From: Buel Ream, Lab In [place] Sent: 09/05/2019  10:56 AM EDT To: Eliezer Bottom, NP

## 2019-10-06 ENCOUNTER — Other Ambulatory Visit: Payer: Self-pay | Admitting: Hematology & Oncology

## 2019-10-10 ENCOUNTER — Inpatient Hospital Stay: Payer: Medicaid Other | Attending: Hematology & Oncology

## 2019-10-10 ENCOUNTER — Inpatient Hospital Stay (HOSPITAL_BASED_OUTPATIENT_CLINIC_OR_DEPARTMENT_OTHER): Payer: Medicaid Other | Admitting: Family

## 2019-10-10 ENCOUNTER — Other Ambulatory Visit: Payer: Self-pay

## 2019-10-10 ENCOUNTER — Telehealth: Payer: Self-pay | Admitting: Family

## 2019-10-10 VITALS — BP 109/61 | HR 77 | Temp 98.5°F | Resp 18 | Ht 65.0 in | Wt 259.0 lb

## 2019-10-10 DIAGNOSIS — C9210 Chronic myeloid leukemia, BCR/ABL-positive, not having achieved remission: Secondary | ICD-10-CM | POA: Diagnosis not present

## 2019-10-10 DIAGNOSIS — D509 Iron deficiency anemia, unspecified: Secondary | ICD-10-CM | POA: Diagnosis not present

## 2019-10-10 DIAGNOSIS — Z79899 Other long term (current) drug therapy: Secondary | ICD-10-CM | POA: Diagnosis not present

## 2019-10-10 LAB — CBC WITH DIFFERENTIAL (CANCER CENTER ONLY)
Abs Immature Granulocytes: 0.15 10*3/uL — ABNORMAL HIGH (ref 0.00–0.07)
Basophils Absolute: 0.4 10*3/uL — ABNORMAL HIGH (ref 0.0–0.1)
Basophils Relative: 3 %
Eosinophils Absolute: 0.1 10*3/uL (ref 0.0–0.5)
Eosinophils Relative: 1 %
HCT: 39.9 % (ref 36.0–46.0)
Hemoglobin: 12.6 g/dL (ref 12.0–15.0)
Immature Granulocytes: 1 %
Lymphocytes Relative: 16 %
Lymphs Abs: 1.9 10*3/uL (ref 0.7–4.0)
MCH: 29.7 pg (ref 26.0–34.0)
MCHC: 31.6 g/dL (ref 30.0–36.0)
MCV: 94.1 fL (ref 80.0–100.0)
Monocytes Absolute: 0.4 10*3/uL (ref 0.1–1.0)
Monocytes Relative: 3 %
Neutro Abs: 8.7 10*3/uL — ABNORMAL HIGH (ref 1.7–7.7)
Neutrophils Relative %: 76 %
Platelet Count: 300 10*3/uL (ref 150–400)
RBC: 4.24 MIL/uL (ref 3.87–5.11)
RDW: 18.6 % — ABNORMAL HIGH (ref 11.5–15.5)
WBC Count: 11.5 10*3/uL — ABNORMAL HIGH (ref 4.0–10.5)
nRBC: 0 % (ref 0.0–0.2)

## 2019-10-10 LAB — CMP (CANCER CENTER ONLY)
ALT: 14 U/L (ref 0–44)
AST: 10 U/L — ABNORMAL LOW (ref 15–41)
Albumin: 3.9 g/dL (ref 3.5–5.0)
Alkaline Phosphatase: 96 U/L (ref 38–126)
Anion gap: 7 (ref 5–15)
BUN: 24 mg/dL — ABNORMAL HIGH (ref 6–20)
CO2: 33 mmol/L — ABNORMAL HIGH (ref 22–32)
Calcium: 9.5 mg/dL (ref 8.9–10.3)
Chloride: 100 mmol/L (ref 98–111)
Creatinine: 1.29 mg/dL — ABNORMAL HIGH (ref 0.44–1.00)
GFR, Est AFR Am: 57 mL/min — ABNORMAL LOW (ref >60)
GFR, Estimated: 49 mL/min — ABNORMAL LOW (ref >60)
Glucose, Bld: 204 mg/dL — ABNORMAL HIGH (ref 70–99)
Potassium: 4.7 mmol/L (ref 3.5–5.1)
Sodium: 140 mmol/L (ref 135–145)
Total Bilirubin: 0.6 mg/dL (ref 0.3–1.2)
Total Protein: 6.4 g/dL — ABNORMAL LOW (ref 6.5–8.1)

## 2019-10-10 LAB — SAVE SMEAR(SSMR), FOR PROVIDER SLIDE REVIEW

## 2019-10-10 LAB — LACTATE DEHYDROGENASE: LDH: 255 U/L — ABNORMAL HIGH (ref 98–192)

## 2019-10-10 NOTE — Telephone Encounter (Signed)
Appointments scheduled patient to get update from [facility] Chart per 8/11 los

## 2019-10-10 NOTE — Progress Notes (Signed)
Hematology and Oncology Follow Up Visit  Tabitha Estrada [id number] 1970-08-03 49 y.o. 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 Tabitha Estrada is here today for follow-up. She looks great today. She is tolerating Hydrea well so far with minimal diarrhea.  Her WBC count today is down to 11.5! BCR/ABL is pending.  She has mild fatigue at times.  She is getting over an upper respiratory infection and is currently taking azithromycin.  She has a cough with occasional yellow phlegm.  No fever, chills, n/v, rash, dizziness, SOB, chest pain, palpitations, abdominal pain or changes in bowel or bladder habits.  No swelling or tenderness in her extremities at this time.  She has numbness and tingling in her right foot associated with bast 3 back surgeries.  No falls or syncope to report.  She has maintained a good appetite and states that she is staying well hydrated. Her weight is stable.  She now has an insulin pump and states that her blood sugars have been "ok".   ECOG Performance Status: 1 - Symptomatic but completely ambulatory  Medications:  Allergies as of 10/10/2019      Reactions   Dulaglutide Nausea And Vomiting, Other (See Comments)   Sulfur Nausea And Vomiting   Metformin Diarrhea      Medication List       Accurate as of October 10, 2019 11:21 AM. If you have any questions, ask your nurse or doctor.        albuterol 108 (90 Base) MCG/ACT inhaler Commonly known as: VENTOLIN HFA Inhale 1-2 puffs into the lungs every 4 (four) hours as needed for wheezing or shortness of breath.   albuterol (2.5  MG/3ML) 0.083% nebulizer solution Commonly known as: PROVENTIL Inhale the contents of one vial in nebulizer every four to six hours as needed for cough or wheeze.   aluminum-magnesium hydroxide-simethicone 353-614-43 MG/5ML Susp Commonly known as: MAALOX Take by mouth.   atorvastatin 80 MG tablet Commonly known as: LIPITOR TAKE 1 TABLET (80 MG TOTAL) BY MOUTH DAILY. NEEDS LAB WORK   budesonide-formoterol 160-4.5 MCG/ACT inhaler Commonly known as: SYMBICORT Inhale 2 puffs into the lungs 2 (two) times daily.   buPROPion 75 MG tablet Commonly known as: WELLBUTRIN Take by mouth.   clobetasol ointment 0.05 % Commonly known as: TEMOVATE Apply 1 application topically 2 (two) times daily.   diphenoxylate-atropine 2.5-0.025 MG tablet Commonly known as: LOMOTIL Take 1 tablet by mouth 4 (four) times daily as needed for diarrhea or loose stools (as needed after taking Sprycel for diarrhea).   fluticasone 50 MCG/ACT nasal spray Commonly known as: FLONASE Use two sprays in each nostril once daily as directed.   furosemide 40 MG tablet Commonly known as: LASIX TAKE 1 TABLET BY MOUTH EVERY DAY FOR 3 DAYS FOR FACIAL SWELLING THEN TABLET DAILY AS NEEDED   hydroxyurea 500 MG capsule Commonly known as: HYDREA Take 1 capsule (500 mg total) by mouth 2 (two) times daily. May take with food to minimize GI side effects.   insulin aspart 100 UNIT/ML FlexPen Commonly known as: NovoLOG FlexPen Inject  40 Units into the skin daily with supper. What changed:   how much to take  additional instructions   insulin glargine 100 UNIT/ML Solostar Pen Commonly known as: Lantus SoloStar Inject 140 Units into the skin daily. And pen needles 5/day What changed: how much to take   ketoconazole 2 % cream Commonly known as: NIZORAL Apply topically 2 (two) times a day.   meclizine 25 MG tablet Commonly known as: ANTIVERT Take 1 tablet (25 mg total) by mouth 3 (three) times daily as needed for  dizziness.   meloxicam 7.5 MG tablet Commonly known as: MOBIC Take 7.5 mg by mouth daily.   montelukast 10 MG tablet Commonly known as: SINGULAIR TAKE 1 TABLET (10 MG TOTAL) BY MOUTH DAILY   omeprazole 40 MG capsule Commonly known as: PRILOSEC Take 40 mg by mouth daily.   OmniPod Dash 5 Pack Pods Misc Inject into the skin.   pregabalin 200 MG capsule Commonly known as: LYRICA TAKE 1 CAPSULE BY MOUTH 2 TIMES DAILY.   valACYclovir 500 MG tablet Commonly known as: VALTREX TAKE ONE TABLET BY MOUTH AT BEDTIME   valsartan 320 MG tablet Commonly known as: DIOVAN Take by mouth.       Allergies:  Allergies  Allergen Reactions  . Dulaglutide Nausea And Vomiting and Other (See Comments)  . Sulfur Nausea And Vomiting  . Metformin Diarrhea    Past Medical History, Surgical history, Social history, and Family History were reviewed and updated.  Review of Systems: All other 10 point review of systems is negative.   Physical Exam:  height is 5\' 5"  (1.651 m) and weight is 259 lb (117.5 kg). Her oral temperature is 98.5 F (36.9 C). Her blood pressure is 109/61 and her pulse is 77. Her respiration is 18 and oxygen saturation is 99%.   Wt Readings from Last 3 Encounters:  10/10/19 259 lb (117.5 kg)  09/05/19 258 lb 11.2 oz (117.3 kg)  07/25/19 254 lb (115.2 kg)    Ocular: Sclerae unicteric, pupils equal, round and reactive to light Ear-nose-throat: Oropharynx clear, dentition fair Lymphatic: No cervical or supraclavicular adenopathy Lungs no rales or rhonchi, good excursion bilaterally Heart regular rate and rhythm, no murmur appreciated Abd soft, nontender, positive bowel sounds, no liver or spleen tip palpated on exam, no fluid wave  MSK no focal spinal tenderness, no joint edema Neuro: non-focal, well-oriented, appropriate affect Breasts: Deferred   Lab Results  Component Value Date   WBC 11.5 (H) 10/10/2019   HGB 12.6 10/10/2019   HCT 39.9 10/10/2019   MCV 94.1  10/10/2019   PLT 300 10/10/2019   Lab Results  Component Value Date   FERRITIN 119 05/01/2019   IRON 77 05/01/2019   TIBC 286 05/01/2019   UIBC 209 05/01/2019   IRONPCTSAT 27 05/01/2019   Lab Results  Component Value Date   RETICCTPCT 1.4 05/01/2019   RBC 4.24 10/10/2019   No results found for: KPAFRELGTCHN, LAMBDASER, KAPLAMBRATIO Lab Results  Component Value Date   IGGSERUM 675 (L) 11/10/2016   IGMSERUM 86 11/10/2016   No results found for: Ronnald Ramp, A1GS, A2GS, Violet Baldy, MSPIKE, SPEI   Chemistry      Component Value Date/Time   NA 140 10/10/2019 1043   NA 144 01/27/2017 1302   NA 138 09/16/2016 1105   K 4.7 10/10/2019 1043   K 4.3 01/27/2017 1302   K 4.2 09/16/2016 1105   CL 100 10/10/2019 1043   CL 102 01/27/2017 1302  CO2 33 (H) 10/10/2019 1043   CO2 27 01/27/2017 1302   CO2 26 09/16/2016 1105   BUN 24 (H) 10/10/2019 1043   BUN 14 01/27/2017 1302   BUN 11.9 09/16/2016 1105   CREATININE 1.29 (H) 10/10/2019 1043   CREATININE 1.1 01/27/2017 1302   CREATININE 0.8 09/16/2016 1105      Component Value Date/Time   CALCIUM 9.5 10/10/2019 1043   CALCIUM 9.5 01/27/2017 1302   CALCIUM 9.0 09/16/2016 1105   ALKPHOS 96 10/10/2019 1043   ALKPHOS 116 (H) 01/27/2017 1302   ALKPHOS 136 09/16/2016 1105   AST 10 (L) 10/10/2019 1043   AST 8 09/16/2016 1105   ALT 14 10/10/2019 1043   ALT 25 01/27/2017 1302   ALT 12 09/16/2016 1105   BILITOT 0.6 10/10/2019 1043   BILITOT 0.32 09/16/2016 1105       Impression and Plan: Ms. Deutscher is a very pleasant 49 yo with chronic phase CMLand history of iron deficiency anemia. She is tolerating Hydrea well and WBC count is now 11.5.  We will continue to follow along closely with her and see her again in 1 month.  She can contact our office with any questions or concerns.    Laverna [name], NP 8/11/202111:21 AM

## 2019-10-18 ENCOUNTER — Encounter: Payer: Self-pay | Admitting: Hematology & Oncology

## 2019-10-22 ENCOUNTER — Telehealth: Payer: Self-pay | Admitting: *Deleted

## 2019-10-22 LAB — BCR/ABL

## 2019-10-22 NOTE — Telephone Encounter (Signed)
-----   Message from Eliezer Bottom, NP sent at 10/22/2019  3:30 PM EDT ----- BCR/ABL is down by half!!!!!!!! WOOOOOO HOOOOOOO!!!!!!! The Hydrea is working!!!! She is doing an awesome job :)   ----- Message ----- From: [place]: 10/10/2019  10:54 AM EDT To: Eliezer Bottom, NP

## 2019-10-22 NOTE — Telephone Encounter (Signed)
Notified pt of results. No concerns at this time. 

## 2019-11-09 ENCOUNTER — Encounter: Payer: Self-pay | Admitting: Family

## 2019-11-09 ENCOUNTER — Other Ambulatory Visit: Payer: Self-pay

## 2019-11-09 ENCOUNTER — Inpatient Hospital Stay: Payer: Medicaid Other | Attending: Hematology & Oncology

## 2019-11-09 ENCOUNTER — Inpatient Hospital Stay (HOSPITAL_BASED_OUTPATIENT_CLINIC_OR_DEPARTMENT_OTHER): Payer: Medicaid Other | Admitting: Family

## 2019-11-09 VITALS — BP 125/88 | HR 77 | Temp 98.2°F | Resp 20 | Ht 65.0 in | Wt 262.1 lb

## 2019-11-09 DIAGNOSIS — R202 Paresthesia of skin: Secondary | ICD-10-CM | POA: Diagnosis not present

## 2019-11-09 DIAGNOSIS — C9210 Chronic myeloid leukemia, BCR/ABL-positive, not having achieved remission: Secondary | ICD-10-CM | POA: Diagnosis not present

## 2019-11-09 DIAGNOSIS — E119 Type 2 diabetes mellitus without complications: Secondary | ICD-10-CM | POA: Diagnosis not present

## 2019-11-09 DIAGNOSIS — D509 Iron deficiency anemia, unspecified: Secondary | ICD-10-CM | POA: Insufficient documentation

## 2019-11-09 DIAGNOSIS — Z9641 Presence of insulin pump (external) (internal): Secondary | ICD-10-CM | POA: Diagnosis not present

## 2019-11-09 DIAGNOSIS — Z79899 Other long term (current) drug therapy: Secondary | ICD-10-CM | POA: Insufficient documentation

## 2019-11-09 DIAGNOSIS — R200 Anesthesia of skin: Secondary | ICD-10-CM | POA: Insufficient documentation

## 2019-11-09 LAB — CMP (CANCER CENTER ONLY)
ALT: 11 U/L (ref 0–44)
AST: 8 U/L — ABNORMAL LOW (ref 15–41)
Albumin: 3.9 g/dL (ref 3.5–5.0)
Alkaline Phosphatase: 105 U/L (ref 38–126)
Anion gap: 9 (ref 5–15)
BUN: 15 mg/dL (ref 6–20)
CO2: 35 mmol/L — ABNORMAL HIGH (ref 22–32)
Calcium: 9.3 mg/dL (ref 8.9–10.3)
Chloride: 95 mmol/L — ABNORMAL LOW (ref 98–111)
Creatinine: 1.17 mg/dL — ABNORMAL HIGH (ref 0.44–1.00)
GFR, Est AFR Am: >60 mL/min (ref >60)
GFR, Estimated: 55 mL/min — ABNORMAL LOW (ref >60)
Glucose, Bld: 287 mg/dL — ABNORMAL HIGH (ref 70–99)
Potassium: 3.8 mmol/L (ref 3.5–5.1)
Sodium: 139 mmol/L (ref 135–145)
Total Bilirubin: 0.5 mg/dL (ref 0.3–1.2)
Total Protein: 6.7 g/dL (ref 6.5–8.1)

## 2019-11-09 LAB — CBC WITH DIFFERENTIAL (CANCER CENTER ONLY)
Abs Immature Granulocytes: 1.9 10*3/uL — ABNORMAL HIGH (ref 0.00–0.07)
Basophils Absolute: 0.5 10*3/uL — ABNORMAL HIGH (ref 0.0–0.1)
Basophils Relative: 3 %
Eosinophils Absolute: 0 10*3/uL (ref 0.0–0.5)
Eosinophils Relative: 0 %
HCT: 39.5 % (ref 36.0–46.0)
Hemoglobin: 12.7 g/dL (ref 12.0–15.0)
Immature Granulocytes: 9 %
Lymphocytes Relative: 11 %
Lymphs Abs: 2.2 10*3/uL (ref 0.7–4.0)
MCH: 32.1 pg (ref 26.0–34.0)
MCHC: 32.2 g/dL (ref 30.0–36.0)
MCV: 99.7 fL (ref 80.0–100.0)
Monocytes Absolute: 0.7 10*3/uL (ref 0.1–1.0)
Monocytes Relative: 4 %
Neutro Abs: 15.4 10*3/uL — ABNORMAL HIGH (ref 1.7–7.7)
Neutrophils Relative %: 73 %
Platelet Count: 332 10*3/uL (ref 150–400)
RBC: 3.96 MIL/uL (ref 3.87–5.11)
RDW: 21.9 % — ABNORMAL HIGH (ref 11.5–15.5)
WBC Count: 20.8 10*3/uL — ABNORMAL HIGH (ref 4.0–10.5)
nRBC: 0 % (ref 0.0–0.2)

## 2019-11-09 LAB — SAVE SMEAR(SSMR), FOR PROVIDER SLIDE REVIEW

## 2019-11-09 NOTE — Progress Notes (Signed)
Hematology and Oncology Follow Up Visit  Tabitha Estrada [id number] 03/09/1970 49 y.o. 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 She is doing fairly well but currently has a sinus infection. She is taking an antibiotic and plans to follow-up with her PCP.  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 This makes it hard for her to get around at times. She has a can to use when needed.  No falls or syncopal episodes to report. No swelling in her extremities at this time. Pedal pulses are 2+.  She has numbness and tingling in the right foot associated with multiple back surgeries.  BCR/ABL was 36.9% at her last visit in August.  She states that she is taking her Hydrea BID as prescribed.  WBC count is 20.8.  Her blood glucose is 287. She has an insulin pump and also takes insulin at bedtime. She states that her blood sugars are still not well controlled.  She has maintained a good appetite and is doing her best to stay well hydrated. Her weight is stable. No episodes of bleeding. No bruising or petechiae.    ECOG Performance Status: 1 - Symptomatic but completely ambulatory  Medications:  Allergies as of 11/09/2019      Reactions   Dulaglutide Nausea And Vomiting, Other (See Comments)   Liraglutide Nausea And Vomiting   Sulfur Nausea And Vomiting   Metformin Diarrhea      Medication List       Accurate as of November 09, 2019 12:11 PM. If you have any questions, ask your nurse or doctor.        albuterol 108 (90 Base) MCG/ACT inhaler Commonly known as: VENTOLIN  HFA Inhale 1-2 puffs into the lungs every 4 (four) hours as needed for wheezing or shortness of breath.   albuterol (2.5 MG/3ML) 0.083% nebulizer solution Commonly known as: PROVENTIL Inhale the contents of one vial in nebulizer every four to six hours as needed for cough or wheeze.   aluminum-magnesium hydroxide-simethicone 130-865-78 MG/5ML Susp Commonly known as: MAALOX Take by mouth.   atorvastatin 80 MG tablet Commonly known as: LIPITOR TAKE 1 TABLET (80 MG TOTAL) BY MOUTH DAILY. NEEDS LAB WORK   budesonide-formoterol 160-4.5 MCG/ACT inhaler Commonly known as: SYMBICORT Inhale 2 puffs into the lungs 2 (two) times daily.   buPROPion 75 MG tablet Commonly known as: WELLBUTRIN Take by mouth.   clobetasol ointment 0.05 % Commonly known as: TEMOVATE Apply 1 application topically 2 (two) times daily.   diphenoxylate-atropine 2.5-0.025 MG tablet Commonly known as: LOMOTIL Take 1 tablet by mouth 4 (four) times daily as needed for diarrhea or loose stools (as needed after taking Sprycel for diarrhea).   fluticasone 50 MCG/ACT nasal spray Commonly known as: FLONASE Use two sprays in each nostril once daily as directed.   furosemide 40 MG tablet Commonly known as: LASIX TAKE 1 TABLET BY MOUTH EVERY DAY FOR 3 DAYS FOR FACIAL SWELLING THEN TABLET DAILY AS NEEDED   hydroxyurea 500 MG capsule Commonly known as: HYDREA Take 1 capsule (500 mg total) by mouth 2 (two)  times daily. May take with food to minimize GI side effects.   insulin aspart 100 UNIT/ML FlexPen Commonly known as: NovoLOG FlexPen Inject 40 Units into the skin daily with supper. What changed:   how much to take  additional instructions   insulin glargine 100 UNIT/ML Solostar Pen Commonly known as: Lantus SoloStar Inject 140 Units into the skin daily. And pen needles 5/day What changed: how much to take   ketoconazole 2 % cream Commonly known as: NIZORAL Apply topically 2 (two) times a day.   meclizine  25 MG tablet Commonly known as: ANTIVERT Take 1 tablet (25 mg total) by mouth 3 (three) times daily as needed for dizziness.   meloxicam 7.5 MG tablet Commonly known as: MOBIC Take 7.5 mg by mouth daily.   montelukast 10 MG tablet Commonly known as: SINGULAIR TAKE 1 TABLET (10 MG TOTAL) BY MOUTH DAILY   omeprazole 40 MG capsule Commonly known as: PRILOSEC Take 40 mg by mouth daily.   OmniPod Dash 5 Pack Pods Misc Inject into the skin.   pregabalin 200 MG capsule Commonly known as: LYRICA TAKE 1 CAPSULE BY MOUTH 2 TIMES DAILY.   valACYclovir 500 MG tablet Commonly known as: VALTREX TAKE ONE TABLET BY MOUTH AT BEDTIME   valsartan 320 MG tablet Commonly known as: DIOVAN Take by mouth.       Allergies:  Allergies  Allergen Reactions  . Dulaglutide Nausea And Vomiting and Other (See Comments)  . Liraglutide Nausea And Vomiting  . Sulfur Nausea And Vomiting  . Metformin Diarrhea    Past Medical History, Surgical history, Social history, and Family History were reviewed and updated.  Review of Systems: All other 10 point review of systems is negative.   Physical Exam:  vitals were not taken for this visit.   Wt Readings from Last 3 Encounters:  10/10/19 259 lb (117.5 kg)  09/05/19 258 lb 11.2 oz (117.3 kg)  07/25/19 254 lb (115.2 kg)    Ocular: Sclerae unicteric, pupils equal, round and reactive to light Ear-nose-throat: Oropharynx clear, dentition fair Lymphatic: No cervical or supraclavicular adenopathy Lungs no rales or rhonchi, good excursion bilaterally Heart regular rate and rhythm, no murmur appreciated Abd soft, nontender, positive bowel sounds, no liver or spleen tip palpated on exam, no fluid wave  MSK no focal spinal tenderness, no joint edema Neuro: non-focal, well-oriented, appropriate affect Breasts: Deferred   Lab Results  Component Value Date   WBC 20.8 (H) 11/09/2019   HGB 12.7 11/09/2019   HCT 39.5 11/09/2019   MCV 99.7 11/09/2019    PLT 332 11/09/2019   Lab Results  Component Value Date   FERRITIN 119 05/01/2019   IRON 77 05/01/2019   TIBC 286 05/01/2019   UIBC 209 05/01/2019   IRONPCTSAT 27 05/01/2019   Lab Results  Component Value Date   RETICCTPCT 1.4 05/01/2019   RBC 3.96 11/09/2019   No results found for: KPAFRELGTCHN, LAMBDASER, KAPLAMBRATIO Lab Results  Component Value Date   IGGSERUM 675 (L) 11/10/2016   IGMSERUM 86 11/10/2016   No results found for: Ronnald Ramp, A1GS, A2GS, Violet Baldy, MSPIKE, SPEI   Chemistry      Component Value Date/Time   NA 139 11/09/2019 1105   NA 144 01/27/2017 1302   NA 138 09/16/2016 1105   K 3.8 11/09/2019 1105   K 4.3 01/27/2017 1302   K 4.2 09/16/2016 1105   CL 95 (L) 11/09/2019 1105   CL 102 01/27/2017 1302   CO2 35 (  H) 11/09/2019 1105   CO2 27 01/27/2017 1302   CO2 26 09/16/2016 1105   BUN 15 11/09/2019 1105   BUN 14 01/27/2017 1302   BUN 11.9 09/16/2016 1105   CREATININE 1.17 (H) 11/09/2019 1105   CREATININE 1.1 01/27/2017 1302   CREATININE 0.8 09/16/2016 1105      Component Value Date/Time   CALCIUM 9.3 11/09/2019 1105   CALCIUM 9.5 01/27/2017 1302   CALCIUM 9.0 09/16/2016 1105   ALKPHOS 105 11/09/2019 1105   ALKPHOS 116 (H) 01/27/2017 1302   ALKPHOS 136 09/16/2016 1105   AST 8 (L) 11/09/2019 1105   AST 8 09/16/2016 1105   ALT 11 11/09/2019 1105   ALT 25 01/27/2017 1302   ALT 12 09/16/2016 1105   BILITOT 0.5 11/09/2019 1105   BILITOT 0.32 09/16/2016 1105       Impression and Plan: Tabitha Estrada is a very pleasant 49 yo with chronic phase CMLand history of iron deficiency anemia. She is getting over a sinus infection and WBC count is a little higher this visit.  We will see what her BCR/ABL looks like. Result is pending.  We will plan to see her again in another 6 weeks.  She can contact our office with any questions or concerns.   Laverna [name], NP 9/10/202112:11 PM

## 2019-11-12 LAB — LACTATE DEHYDROGENASE: LDH: 327 U/L — ABNORMAL HIGH (ref 98–192)

## 2019-11-16 ENCOUNTER — Telehealth: Payer: Self-pay | Admitting: Family

## 2019-11-16 ENCOUNTER — Other Ambulatory Visit: Payer: Self-pay | Admitting: Family

## 2019-11-16 ENCOUNTER — Other Ambulatory Visit: Payer: Self-pay | Admitting: Hematology & Oncology

## 2019-11-16 LAB — BCR/ABL

## 2019-11-16 MED ORDER — TASIGNA 50 MG PO CAPS: 50.0000 mg | ORAL_CAPSULE | Freq: Every day | ORAL | 3 refills | Status: DC

## 2019-11-16 NOTE — Telephone Encounter (Signed)
I left message with call back number to discuss recent lab work and new medication change.   BCR/ABL is going back up significantly. Hydrea is no longer working. Dr. Marin Olp has placed a new order for Tasigna. Will need to go over side effects and dosing with her.

## 2019-11-18 ENCOUNTER — Other Ambulatory Visit: Payer: Self-pay | Admitting: Hematology & Oncology

## 2019-11-19 ENCOUNTER — Telehealth: Payer: Self-pay

## 2019-11-19 ENCOUNTER — Telehealth: Payer: Self-pay | Admitting: Pharmacy Technician

## 2019-11-19 ENCOUNTER — Telehealth: Payer: Self-pay | Admitting: Pharmacist

## 2019-11-19 NOTE — Telephone Encounter (Signed)
Oral Oncology Patient Advocate Encounter  After completing a benefits investigation, prior authorization for Tasigna is not required at this time through [facility].  Patient's copay is $0.00.    [place] Patient [place] Phone [phone] Fax [phone] 11/19/2019 11:17 AM

## 2019-11-19 NOTE — Telephone Encounter (Signed)
Oral Oncology Pharmacist Encounter  Received new prescription for Tasigna (nilotinib) for the treatment of CML, planned duration until disease progression or unacceptable drug toxicity. She has been through several other CML TKIs which were stopped due to intolerance.  CMP from 11/09/19 assessed, no relevant lab abnormalities. Prescription dose and frequency assessed. Reached out the MD about the low dosing in the prescription for nilotinib, he is starting her on the low dose due to concern about intolerance and he plans on increase the dose as tolerated.  Current medication list in Epic reviewed, several DDIs with nilotinib identified: -Maalox and omeprazole: may decrease the concentration of nilotinib. Omeprazole should be discontinued if possible. Maalox should be separated from the nilotinib by 2 hours or more. If needed H2 antagonist can be used but should be given 10 hours before or 2 hours after nilotinib. -Atorvastatin: Nilotinib may increase the concentration of atorvastatin. Monitor patient for increased atorvastatin adverse events (eg, myopathy, rhabdomyolysis)  Evaluated chart and no patient barriers to medication adherence identified.   Prescription has been e-scribed to the [facility] for benefits analysis and approval.  Oral Oncology Clinic will continue to follow for insurance authorization, copayment issues, initial counseling and start date.  Darl Pikes, PharmD, BCPS, BCOP, CPP Hematology/Oncology Clinical Pharmacist Practitioner [facility] [place] [facility] [phone]  11/20/2019 9:48 AM

## 2019-11-19 NOTE — Telephone Encounter (Signed)
Called and informed patient per Tabitha Roch Cinncinati,NP the hydrea was no longer working, BCR/ABl were increasing and she wants to try a new medication called Tasigna, informed her the pharmacist will be calling her this afternoon to discuss side effects and dosing information. She verbalized understanding.

## 2019-11-21 NOTE — Telephone Encounter (Signed)
Oral Oncology Patient Advocate Encounter  I spoke with Tabitha Estrada this afternoon to set up delivery of Tasigna.  Address verified for shipment.  Tasigna will be filled through [facility] and mailed 11/23/19 for delivery 11/26/19.    [place] will call 7-10 days before next refill is due to complete adherence call and set up delivery of medication.     [place] Patient [place] Phone [phone] Fax [phone] 11/21/2019 3:10 PM

## 2019-11-21 NOTE — Telephone Encounter (Signed)
Oral Chemotherapy Pharmacist Encounter  Patient Education I spoke with patient for overview of new oral chemotherapy medication: Tasigna (nilotinib) for the treatment of CML, planned duration until disease progression or unacceptable drug toxicity.   Pt is doing well. Counseled patient on administration, dosing, side effects, monitoring, drug-food interactions, safe handling, storage, and disposal. Patient will take 50 mg by mouth daily. I explained to Tabitha Estrada that this is a low dose of nilotinib. MD is starting her on the low dose due to concern about intolerance and he plans on increase the dose as tolerated.  Side effects include but not limited to: rash/itchy skin, N/V, fatgiue, HA, decreased plt/wbc, change in liver function.    Reviewed with patient importance of keeping a medication schedule and plan for any missed doses.  After discussion with patient no patient barriers to medication adherence identified.   Tabitha Estrada voiced understanding and appreciation. All questions answered. Medication handout provided.  Provided patient with Oral [facility] phone number. Patient knows to call the office with questions or concerns. Oral Chemotherapy Navigation Clinic will continue to follow.  Darl Pikes, PharmD, BCPS, BCOP, CPP Hematology/Oncology Clinical Pharmacist Practitioner [facility] [place] [facility] [phone]  11/21/2019 3:16 PM

## 2019-11-22 ENCOUNTER — Other Ambulatory Visit: Payer: Self-pay

## 2019-11-22 DIAGNOSIS — C9210 Chronic myeloid leukemia, BCR/ABL-positive, not having achieved remission: Secondary | ICD-10-CM

## 2019-11-22 MED ORDER — PROCHLORPERAZINE MALEATE 10 MG PO TABS: 10.0000 mg | ORAL_TABLET | Freq: Four times a day (QID) | ORAL | 0 refills | Status: DC | PRN

## 2019-11-23 MED FILL — TASIGNA 50 MG CAPS: 50 | 30 days supply | Qty: 30 | Fill #0

## 2019-11-25 ENCOUNTER — Other Ambulatory Visit: Payer: Self-pay | Admitting: Hematology & Oncology

## 2019-11-25 DIAGNOSIS — C9210 Chronic myeloid leukemia, BCR/ABL-positive, not having achieved remission: Secondary | ICD-10-CM

## 2019-12-10 ENCOUNTER — Telehealth: Payer: Self-pay

## 2019-12-10 DIAGNOSIS — E876 Hypokalemia: Secondary | ICD-10-CM

## 2019-12-10 NOTE — Telephone Encounter (Signed)
Patient left voicemail to call regarding medications - new on Tasigna. Want to check on new start to therapy and any other new medications. Left voicemail with patient to call back at [phone].  Benn Moulder, PharmD Pharmacy Resident  12/10/2019 11:49 AM

## 2019-12-10 NOTE — Telephone Encounter (Signed)
Patient called concerning new start of potassium chloride - drug interaction check was insignificant for interactions with potassium.  Patient had concerns regarding timing of medication nilotinib - pt eats breakfast immediately upon wakening due to blood glucose/diabetes concerns. Informed pt that nilotinib can be taken 1 hour before or 2 hours after meals - pt will begin taking 2 hours after breakfast and waiting 1 hour after dose to eat next meal. Pt believes this will help with timing.  Pt has no other questions or concerns regarding niltonib at this time. Informed pt she is welcome to call if any questions or concerns arise at [phone].  Benn Moulder, PharmD Pharmacy Resident  12/10/2019 12:20 PM

## 2019-12-21 ENCOUNTER — Other Ambulatory Visit: Payer: Self-pay

## 2019-12-21 ENCOUNTER — Inpatient Hospital Stay: Payer: Medicaid Other | Attending: Hematology & Oncology | Admitting: Family

## 2019-12-21 ENCOUNTER — Encounter: Payer: Self-pay | Admitting: Family

## 2019-12-21 ENCOUNTER — Inpatient Hospital Stay: Payer: Medicaid Other

## 2019-12-21 VITALS — BP 103/80 | HR 88 | Temp 98.0°F | Resp 16 | Wt 262.0 lb

## 2019-12-21 DIAGNOSIS — R739 Hyperglycemia, unspecified: Secondary | ICD-10-CM | POA: Diagnosis not present

## 2019-12-21 DIAGNOSIS — D509 Iron deficiency anemia, unspecified: Secondary | ICD-10-CM | POA: Insufficient documentation

## 2019-12-21 DIAGNOSIS — R202 Paresthesia of skin: Secondary | ICD-10-CM | POA: Diagnosis not present

## 2019-12-21 DIAGNOSIS — Z79899 Other long term (current) drug therapy: Secondary | ICD-10-CM | POA: Diagnosis not present

## 2019-12-21 DIAGNOSIS — C9210 Chronic myeloid leukemia, BCR/ABL-positive, not having achieved remission: Secondary | ICD-10-CM | POA: Insufficient documentation

## 2019-12-21 DIAGNOSIS — R110 Nausea: Secondary | ICD-10-CM | POA: Diagnosis not present

## 2019-12-21 DIAGNOSIS — J45901 Unspecified asthma with (acute) exacerbation: Secondary | ICD-10-CM | POA: Insufficient documentation

## 2019-12-21 DIAGNOSIS — R531 Weakness: Secondary | ICD-10-CM | POA: Insufficient documentation

## 2019-12-21 DIAGNOSIS — R200 Anesthesia of skin: Secondary | ICD-10-CM | POA: Diagnosis not present

## 2019-12-21 DIAGNOSIS — R5383 Other fatigue: Secondary | ICD-10-CM | POA: Diagnosis not present

## 2019-12-21 LAB — CBC WITH DIFFERENTIAL (CANCER CENTER ONLY)
Abs Immature Granulocytes: 2.63 10*3/uL — ABNORMAL HIGH (ref 0.00–0.07)
Basophils Absolute: 0.2 10*3/uL — ABNORMAL HIGH (ref 0.0–0.1)
Basophils Relative: 1 %
Eosinophils Absolute: 0.2 10*3/uL (ref 0.0–0.5)
Eosinophils Relative: 1 %
HCT: 40.9 % (ref 36.0–46.0)
Hemoglobin: 13.2 g/dL (ref 12.0–15.0)
Immature Granulocytes: 9 %
Lymphocytes Relative: 11 %
Lymphs Abs: 3.1 10*3/uL (ref 0.7–4.0)
MCH: 32.2 pg (ref 26.0–34.0)
MCHC: 32.3 g/dL (ref 30.0–36.0)
MCV: 99.8 fL (ref 80.0–100.0)
Monocytes Absolute: 0.8 10*3/uL (ref 0.1–1.0)
Monocytes Relative: 3 %
Neutro Abs: 22.4 10*3/uL — ABNORMAL HIGH (ref 1.7–7.7)
Neutrophils Relative %: 75 %
Platelet Count: 607 10*3/uL — ABNORMAL HIGH (ref 150–400)
RBC: 4.1 MIL/uL (ref 3.87–5.11)
RDW: 19.7 % — ABNORMAL HIGH (ref 11.5–15.5)
WBC Count: 29.3 10*3/uL — ABNORMAL HIGH (ref 4.0–10.5)
nRBC: 0 % (ref 0.0–0.2)

## 2019-12-21 LAB — CMP (CANCER CENTER ONLY)
ALT: 9 U/L (ref 0–44)
AST: <5 U/L — ABNORMAL LOW (ref 15–41)
Albumin: 3.9 g/dL (ref 3.5–5.0)
Alkaline Phosphatase: 108 U/L (ref 38–126)
Anion gap: 9 (ref 5–15)
BUN: 21 mg/dL — ABNORMAL HIGH (ref 6–20)
CO2: 34 mmol/L — ABNORMAL HIGH (ref 22–32)
Calcium: 10 mg/dL (ref 8.9–10.3)
Chloride: 96 mmol/L — ABNORMAL LOW (ref 98–111)
Creatinine: 1.37 mg/dL — ABNORMAL HIGH (ref 0.44–1.00)
GFR, Estimated: 48 mL/min — ABNORMAL LOW (ref >60)
Glucose, Bld: 224 mg/dL — ABNORMAL HIGH (ref 70–99)
Potassium: 4.2 mmol/L (ref 3.5–5.1)
Sodium: 139 mmol/L (ref 135–145)
Total Bilirubin: 0.4 mg/dL (ref 0.3–1.2)
Total Protein: 7.1 g/dL (ref 6.5–8.1)

## 2019-12-21 LAB — SAVE SMEAR(SSMR), FOR PROVIDER SLIDE REVIEW

## 2019-12-21 NOTE — Progress Notes (Signed)
Hematology and Oncology Follow Up Visit  Tabitha Estrada [id number] 1970/04/26 49 y.o. 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 Tabitha Estrada is here today for follow-up. She is doing fairly well but feels fatigued.  WBC count today is 29, platelets 607, HGB 13.2.  She started the Tasigna almost 2 weeks ago. She verbalized that she is taking daily as prescribed and so far, she is tolerating well. No adverse effects.  No episodes of blood loss. No abnormal bruising, no petechiae.  She is still having issues with hyperglycemia. She stopped the insulin pump sue to it coming off at night. She is currently on an insulin regimen but here glucose remains elevated.  She has occasional episodes of nausea. No vomiting.  No fever, chills, n/v, cough, rash, dizziness, palpitations, abdominal pain or changes in bowel or bladder habits.  She has occasional SOB and chest soreness she states is due to COPD/Asthma exacerbation. She takes a break to rest as needed.  No swelling or tenderness in her extremities.  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  No falls or syncopal episodes to report but she does note that her legs are weak.  She has a good appetite and is staying well hydrated. Her weight is stable at 262 lbs.   ECOG Performance Status: 1 - Symptomatic but completely ambulatory  Medications:  Allergies as of 12/21/2019      Reactions   Dulaglutide Nausea And Vomiting, Other (See Comments)   Liraglutide Nausea And Vomiting   Sulfur  Nausea And Vomiting   Metformin Diarrhea      Medication List       Accurate as of December 21, 2019  1:21 PM. If you have any questions, ask your nurse or doctor.        STOP taking these medications   buPROPion 75 MG tablet Commonly known as: WELLBUTRIN Stopped by: Laverna [name], NP   furosemide 40 MG tablet Commonly known as: LASIX Stopped by: Laverna [name], NP   hydroxyurea 500 MG capsule Commonly known as: HYDREA Stopped by: Laverna [name], NP   OmniPod Dash 5 Pack Pods Misc Stopped by: Laverna [name], NP     TAKE these medications   albuterol 108 (90 Base) MCG/ACT inhaler Commonly known as: VENTOLIN HFA Inhale 1-2 puffs into the lungs every 4 (four) hours as needed for wheezing or shortness of breath.   albuterol (2.5 MG/3ML) 0.083% nebulizer solution Commonly known as: PROVENTIL Inhale the contents of one vial in nebulizer every four to six hours as needed for cough or wheeze.   aluminum-magnesium hydroxide-simethicone 196-222-97 MG/5ML Susp Commonly known as: MAALOX Take by mouth.   atorvastatin 80 MG tablet Commonly known as: LIPITOR TAKE 1 TABLET (80 MG TOTAL) BY MOUTH DAILY. NEEDS LAB WORK   Baqsimi One Pack 3 MG/DOSE Powd Generic drug: Glucagon Place 1 spray into the nose as needed.   budesonide-formoterol 160-4.5 MCG/ACT inhaler Commonly known as: SYMBICORT Inhale 2 puffs into the lungs 2 (two) times daily.   clobetasol ointment 0.05 % Commonly known as: TEMOVATE Apply  1 application topically 2 (two) times daily.   cyclobenzaprine 10 MG tablet Commonly known as: FLEXERIL Take 10 mg by mouth as needed.   diclofenac Sodium 1 % Gel Commonly known as: VOLTAREN SMARTSIG:Gram(s) Topical Twice Daily   diphenoxylate-atropine 2.5-0.025 MG tablet Commonly known as: LOMOTIL Take 1 tablet by mouth 4 (four) times daily as needed for diarrhea or loose stools (as needed after taking Sprycel for diarrhea).   DULoxetine 30 MG capsule Commonly  known as: CYMBALTA Take 30 mg by mouth daily.   fluticasone 50 MCG/ACT nasal spray Commonly known as: FLONASE Use two sprays in each nostril once daily as directed.   insulin aspart 100 UNIT/ML FlexPen Commonly known as: NovoLOG FlexPen Inject 40 Units into the skin daily with supper. What changed:   how much to take  additional instructions   insulin glargine (1 Unit Dial) 300 UNIT/ML Solostar Pen Commonly known as: TOUJEO Inject 70 Units into the skin daily. What changed: Another medication with the same name was removed. Continue taking this medication, and follow the directions you see here. Changed by: Laverna [name], NP   ketoconazole 2 % cream Commonly known as: NIZORAL Apply topically 2 (two) times a day.   meclizine 25 MG tablet Commonly known as: ANTIVERT Take 1 tablet (25 mg total) by mouth 3 (three) times daily as needed for dizziness.   meloxicam 7.5 MG tablet Commonly known as: MOBIC Take 7.5 mg by mouth daily.   montelukast 10 MG tablet Commonly known as: SINGULAIR TAKE 1 TABLET (10 MG TOTAL) BY MOUTH DAILY   omeprazole 40 MG capsule Commonly known as: PRILOSEC Take 40 mg by mouth daily.   potassium chloride 10 MEQ tablet Commonly known as: KLOR-CON Take 10 mEq by mouth daily.   pregabalin 200 MG capsule Commonly known as: LYRICA TAKE 1 CAPSULE BY MOUTH 2 TIMES DAILY.   prochlorperazine 10 MG tablet Commonly known as: COMPAZINE TAKE 1 TABLET BY MOUTH EVERY 6 HOURS AS NEEDED FOR NAUSEA AND VOMITING   Tasigna 50 MG Caps Generic drug: Nilotinib HCl Take 50 mg by mouth daily.   torsemide 20 MG tablet Commonly known as: DEMADEX Take 1 tablet by mouth daily.   valACYclovir 500 MG tablet Commonly known as: VALTREX TAKE ONE TABLET BY MOUTH AT BEDTIME   valsartan 320 MG tablet Commonly known as: DIOVAN Take by mouth.       Allergies:  Allergies  Allergen Reactions  . Dulaglutide Nausea And Vomiting and Other (See Comments)  .  Liraglutide Nausea And Vomiting  . Sulfur Nausea And Vomiting  . Metformin Diarrhea    Past Medical History, Surgical history, Social history, and Family History were reviewed and updated.  Review of Systems: All other 10 point review of systems is negative.   Physical Exam:  weight is 262 lb (118.8 kg). Her oral temperature is 98 F (36.7 C). Her blood pressure is 103/80 and her pulse is 88. Her respiration is 16 and oxygen saturation is 96%.   Wt Readings from Last 3 Encounters:  12/21/19 262 lb (118.8 kg)  11/09/19 262 lb 1.3 oz (118.9 kg)  10/10/19 259 lb (117.5 kg)    Ocular: Sclerae unicteric, pupils equal, round and reactive to light Ear-nose-throat: Oropharynx clear, dentition fair Lymphatic: No cervical or supraclavicular adenopathy Lungs no rales or rhonchi, good excursion bilaterally Heart regular rate and rhythm, no murmur appreciated Abd soft, nontender, positive bowel sounds, no liver or spleen tip palpated on exam, no fluid wave  MSK no  focal spinal tenderness, no joint edema Neuro: non-focal, well-oriented, appropriate affect Breasts: Deferred   Lab Results  Component Value Date   WBC 29.3 (H) 12/21/2019   HGB 13.2 12/21/2019   HCT 40.9 12/21/2019   MCV 99.8 12/21/2019   PLT 607 (H) 12/21/2019   Lab Results  Component Value Date   FERRITIN 119 05/01/2019   IRON 77 05/01/2019   TIBC 286 05/01/2019   UIBC 209 05/01/2019   IRONPCTSAT 27 05/01/2019   Lab Results  Component Value Date   RETICCTPCT 1.4 05/01/2019   RBC 4.10 12/21/2019   No results found for: KPAFRELGTCHN, LAMBDASER, [facility] Lab Results  Component Value Date   IGGSERUM 675 (L) 11/10/2016   IGMSERUM 86 11/10/2016   No results found for: Odetta Pink, SPEI   Chemistry      Component Value Date/Time   NA 139 11/09/2019 1105   NA 144 01/27/2017 1302   NA 138 09/16/2016 1105   K 3.8 11/09/2019 1105   K 4.3 01/27/2017 1302     K 4.2 09/16/2016 1105   CL 95 (L) 11/09/2019 1105   CL 102 01/27/2017 1302   CO2 35 (H) 11/09/2019 1105   CO2 27 01/27/2017 1302   CO2 26 09/16/2016 1105   BUN 15 11/09/2019 1105   BUN 14 01/27/2017 1302   BUN 11.9 09/16/2016 1105   CREATININE 1.17 (H) 11/09/2019 1105   CREATININE 1.1 01/27/2017 1302   CREATININE 0.8 09/16/2016 1105      Component Value Date/Time   CALCIUM 9.3 11/09/2019 1105   CALCIUM 9.5 01/27/2017 1302   CALCIUM 9.0 09/16/2016 1105   ALKPHOS 105 11/09/2019 1105   ALKPHOS 116 (H) 01/27/2017 1302   ALKPHOS 136 09/16/2016 1105   AST 8 (L) 11/09/2019 1105   AST 8 09/16/2016 1105   ALT 11 11/09/2019 1105   ALT 25 01/27/2017 1302   ALT 12 09/16/2016 1105   BILITOT 0.5 11/09/2019 1105   BILITOT 0.32 09/16/2016 1105       Impression and Plan: Ms. Console is a very pleasant 49 yo with chronic phase CMLand history of iron deficiency anemia. BCR/ABL is pending.  She will continue her same regimen with Tasigna and we will follow-up in 1 months.  She was encouraged to contact our office with any questions or concerns.   Laverna [name], NP 10/22/20211:21 PM

## 2019-12-24 LAB — LACTATE DEHYDROGENASE: LDH: 347 U/L — ABNORMAL HIGH (ref 98–192)

## 2019-12-27 LAB — BCR/ABL

## 2019-12-28 ENCOUNTER — Telehealth: Payer: Self-pay | Admitting: Family

## 2019-12-28 NOTE — Telephone Encounter (Signed)
Left voicemail with call back number to go over patients BCR/ABL result. Will recheck at her next visit after she has been on [place] a little over a month.

## 2019-12-31 ENCOUNTER — Encounter: Payer: Self-pay | Admitting: Family

## 2019-12-31 ENCOUNTER — Encounter: Payer: Self-pay | Admitting: *Deleted

## 2020-01-04 ENCOUNTER — Encounter: Payer: Self-pay | Admitting: Family

## 2020-01-07 ENCOUNTER — Other Ambulatory Visit: Payer: Self-pay | Admitting: *Deleted

## 2020-01-07 DIAGNOSIS — C9210 Chronic myeloid leukemia, BCR/ABL-positive, not having achieved remission: Secondary | ICD-10-CM

## 2020-01-07 MED FILL — TASIGNA 50 MG CAPS: 50 | 30 days supply | Qty: 30 | Fill #1

## 2020-01-08 ENCOUNTER — Other Ambulatory Visit: Payer: Self-pay | Admitting: Family

## 2020-01-08 ENCOUNTER — Inpatient Hospital Stay: Payer: Medicaid Other | Attending: Hematology & Oncology

## 2020-01-08 ENCOUNTER — Other Ambulatory Visit: Payer: Self-pay

## 2020-01-08 DIAGNOSIS — C9210 Chronic myeloid leukemia, BCR/ABL-positive, not having achieved remission: Secondary | ICD-10-CM | POA: Diagnosis present

## 2020-01-08 DIAGNOSIS — Z79899 Other long term (current) drug therapy: Secondary | ICD-10-CM | POA: Insufficient documentation

## 2020-01-08 DIAGNOSIS — D509 Iron deficiency anemia, unspecified: Secondary | ICD-10-CM | POA: Insufficient documentation

## 2020-01-08 DIAGNOSIS — J449 Chronic obstructive pulmonary disease, unspecified: Secondary | ICD-10-CM | POA: Insufficient documentation

## 2020-01-08 DIAGNOSIS — D508 Other iron deficiency anemias: Secondary | ICD-10-CM

## 2020-01-08 LAB — CBC WITH DIFFERENTIAL (CANCER CENTER ONLY)
Abs Immature Granulocytes: 1.45 10*3/uL — ABNORMAL HIGH (ref 0.00–0.07)
Basophils Absolute: 0.5 10*3/uL — ABNORMAL HIGH (ref 0.0–0.1)
Basophils Relative: 2 %
Eosinophils Absolute: 0.1 10*3/uL (ref 0.0–0.5)
Eosinophils Relative: 1 %
HCT: 37.1 % (ref 36.0–46.0)
Hemoglobin: 11.8 g/dL — ABNORMAL LOW (ref 12.0–15.0)
Immature Granulocytes: 6 %
Lymphocytes Relative: 12 %
Lymphs Abs: 2.8 10*3/uL (ref 0.7–4.0)
MCH: 31.9 pg (ref 26.0–34.0)
MCHC: 31.8 g/dL (ref 30.0–36.0)
MCV: 100.3 fL — ABNORMAL HIGH (ref 80.0–100.0)
Monocytes Absolute: 0.6 10*3/uL (ref 0.1–1.0)
Monocytes Relative: 3 %
Neutro Abs: 18.3 10*3/uL — ABNORMAL HIGH (ref 1.7–7.7)
Neutrophils Relative %: 76 %
Platelet Count: 489 10*3/uL — ABNORMAL HIGH (ref 150–400)
RBC: 3.7 MIL/uL — ABNORMAL LOW (ref 3.87–5.11)
RDW: 18.8 % — ABNORMAL HIGH (ref 11.5–15.5)
WBC Count: 23.8 10*3/uL — ABNORMAL HIGH (ref 4.0–10.5)
nRBC: 0 % (ref 0.0–0.2)

## 2020-01-08 LAB — CMP (CANCER CENTER ONLY)
ALT: 8 U/L (ref 0–44)
AST: 5 U/L — ABNORMAL LOW (ref 15–41)
Albumin: 3.7 g/dL (ref 3.5–5.0)
Alkaline Phosphatase: 92 U/L (ref 38–126)
Anion gap: 8 (ref 5–15)
BUN: 21 mg/dL — ABNORMAL HIGH (ref 6–20)
CO2: 33 mmol/L — ABNORMAL HIGH (ref 22–32)
Calcium: 9 mg/dL (ref 8.9–10.3)
Chloride: 100 mmol/L (ref 98–111)
Creatinine: 1.22 mg/dL — ABNORMAL HIGH (ref 0.44–1.00)
GFR, Estimated: 55 mL/min — ABNORMAL LOW (ref >60)
Glucose, Bld: 148 mg/dL — ABNORMAL HIGH (ref 70–99)
Potassium: 4.2 mmol/L (ref 3.5–5.1)
Sodium: 141 mmol/L (ref 135–145)
Total Bilirubin: 0.6 mg/dL (ref 0.3–1.2)
Total Protein: 6.4 g/dL — ABNORMAL LOW (ref 6.5–8.1)

## 2020-01-23 ENCOUNTER — Encounter: Payer: Self-pay | Admitting: Hematology & Oncology

## 2020-01-23 ENCOUNTER — Inpatient Hospital Stay: Payer: Medicaid Other

## 2020-01-23 ENCOUNTER — Other Ambulatory Visit: Payer: Self-pay | Admitting: Family

## 2020-01-23 ENCOUNTER — Other Ambulatory Visit: Payer: Self-pay

## 2020-01-23 ENCOUNTER — Inpatient Hospital Stay (HOSPITAL_BASED_OUTPATIENT_CLINIC_OR_DEPARTMENT_OTHER): Payer: Medicaid Other | Admitting: Hematology & Oncology

## 2020-01-23 VITALS — BP 118/81 | HR 81 | Temp 97.9°F | Resp 20 | Wt 269.0 lb

## 2020-01-23 DIAGNOSIS — C9210 Chronic myeloid leukemia, BCR/ABL-positive, not having achieved remission: Secondary | ICD-10-CM

## 2020-01-23 DIAGNOSIS — D508 Other iron deficiency anemias: Secondary | ICD-10-CM

## 2020-01-23 LAB — CMP (CANCER CENTER ONLY)
ALT: 9 U/L (ref 0–44)
AST: 7 U/L — ABNORMAL LOW (ref 15–41)
Albumin: 3.7 g/dL (ref 3.5–5.0)
Alkaline Phosphatase: 95 U/L (ref 38–126)
Anion gap: 9 (ref 5–15)
BUN: 16 mg/dL (ref 6–20)
CO2: 35 mmol/L — ABNORMAL HIGH (ref 22–32)
Calcium: 8.5 mg/dL — ABNORMAL LOW (ref 8.9–10.3)
Chloride: 99 mmol/L (ref 98–111)
Creatinine: 1.11 mg/dL — ABNORMAL HIGH (ref 0.44–1.00)
GFR, Estimated: >60 mL/min (ref >60)
Glucose, Bld: 186 mg/dL — ABNORMAL HIGH (ref 70–99)
Potassium: 3.6 mmol/L (ref 3.5–5.1)
Sodium: 143 mmol/L (ref 135–145)
Total Bilirubin: 0.5 mg/dL (ref 0.3–1.2)
Total Protein: 6.5 g/dL (ref 6.5–8.1)

## 2020-01-23 LAB — CBC WITH DIFFERENTIAL (CANCER CENTER ONLY)
Abs Immature Granulocytes: 0.45 10*3/uL — ABNORMAL HIGH (ref 0.00–0.07)
Basophils Absolute: 0.3 10*3/uL — ABNORMAL HIGH (ref 0.0–0.1)
Basophils Relative: 2 %
Eosinophils Absolute: 0.1 10*3/uL (ref 0.0–0.5)
Eosinophils Relative: 1 %
HCT: 35.2 % — ABNORMAL LOW (ref 36.0–46.0)
Hemoglobin: 11.2 g/dL — ABNORMAL LOW (ref 12.0–15.0)
Immature Granulocytes: 3 %
Lymphocytes Relative: 13 %
Lymphs Abs: 2.1 10*3/uL (ref 0.7–4.0)
MCH: 32.1 pg (ref 26.0–34.0)
MCHC: 31.8 g/dL (ref 30.0–36.0)
MCV: 100.9 fL — ABNORMAL HIGH (ref 80.0–100.0)
Monocytes Absolute: 0.4 10*3/uL (ref 0.1–1.0)
Monocytes Relative: 3 %
Neutro Abs: 12.3 10*3/uL — ABNORMAL HIGH (ref 1.7–7.7)
Neutrophils Relative %: 78 %
Platelet Count: 485 10*3/uL — ABNORMAL HIGH (ref 150–400)
RBC: 3.49 MIL/uL — ABNORMAL LOW (ref 3.87–5.11)
RDW: 17.3 % — ABNORMAL HIGH (ref 11.5–15.5)
WBC Count: 15.7 10*3/uL — ABNORMAL HIGH (ref 4.0–10.5)
nRBC: 0 % (ref 0.0–0.2)

## 2020-01-23 LAB — IRON AND TIBC
Iron: 50 ug/dL (ref 41–142)
Saturation Ratios: 20 % — ABNORMAL LOW (ref 21–57)
TIBC: 243 ug/dL (ref 236–444)
UIBC: 193 ug/dL (ref 120–384)

## 2020-01-23 LAB — FERRITIN: Ferritin: 206 ng/mL (ref 11–307)

## 2020-01-23 MED ORDER — MUPIROCIN CALCIUM 2 % EX CREA: 1.0000 "application " | TOPICAL_CREAM | Freq: Two times a day (BID) | CUTANEOUS | 0 refills | Status: DC

## 2020-01-23 NOTE — Progress Notes (Signed)
Hematology and Oncology Follow Up Visit  Tabitha Estrada [id number] 1970-12-31 49 y.o. 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  We now have her on low-dose Tasigna.  Thankfully, she is tolerating this fairly well right now.  She is having a little bit of diarrhea but not all that much.  Her blood sugars are still on the high side.  This I think will be her biggest problem in the future.  She has had no rashes.  She has this little area on the right side of her face.  This is in the angle of the jaw.  She has had this for about 5 months.  She is not seeing a dermatologist.  She is put some over-the-counter topical agent on without any effect.  We will try some Bactroban.  If the Bactroban does not work, then she really needs to see a dermatologist.  She is still having COPD issues.  She sees her pulmonologist in December.  She has had no fever.  She has had a little bit of a cough.  It is nonproductive.  She has back and head issues.  She has had x-rays.  She has had degenerative changes.  Currently, I would say her performance status is ECOG 1-2.    Medications:  Allergies as of 01/23/2020      Reactions   Dulaglutide Nausea And Vomiting, Other (See Comments)   Liraglutide Nausea And Vomiting   Sulfur Nausea And Vomiting   Metformin Diarrhea      Medication List       Accurate as of January 23, 2020  1:47 PM. If you have any questions, ask your nurse or doctor.        STOP taking these medications   DULoxetine 30 MG capsule Commonly known as:  CYMBALTA Stopped by: [name], MD     TAKE these medications   albuterol 108 (90 Base) MCG/ACT inhaler Commonly known as: VENTOLIN HFA Inhale 1-2 puffs into the lungs every 4 (four) hours as needed for wheezing or shortness of breath.   albuterol (2.5 MG/3ML) 0.083% nebulizer solution Commonly known as: PROVENTIL Inhale the contents of one vial in nebulizer every four to six hours as needed for cough or wheeze.   aluminum-magnesium hydroxide-simethicone 299-371-69 MG/5ML Susp Commonly known as: MAALOX Take by mouth daily as needed.   atorvastatin 80 MG tablet Commonly known as: LIPITOR TAKE 1 TABLET (80 MG TOTAL) BY MOUTH DAILY. NEEDS LAB WORK   Baqsimi One Pack 3 MG/DOSE Powd Generic drug: Glucagon Place 1 spray into the nose as needed.   budesonide-formoterol 160-4.5 MCG/ACT inhaler Commonly known as: SYMBICORT Inhale 2 puffs into the lungs 2 (two) times daily.   clobetasol ointment 0.05 % Commonly known as: TEMOVATE Apply 1 application topically 2 (two) times daily.   cyclobenzaprine 10 MG tablet Commonly known as: FLEXERIL Take 10 mg by mouth daily as needed.   diclofenac Sodium 1 % Gel Commonly known as: VOLTAREN SMARTSIG:Gram(s) Topical Twice Daily   diphenoxylate-atropine 2.5-0.025 MG tablet Commonly known as: LOMOTIL Take 1 tablet by mouth  4 (four) times daily as needed for diarrhea or loose stools (as needed after taking Sprycel for diarrhea).   fluticasone 50 MCG/ACT nasal spray Commonly known as: FLONASE Use two sprays in each nostril once daily as directed.   insulin aspart 100 UNIT/ML FlexPen Commonly known as: NovoLOG FlexPen Inject 40 Units into the skin daily with supper. What changed:   how much to take  additional instructions   insulin glargine (1 Unit Dial) 300 UNIT/ML Solostar Pen Commonly known as: TOUJEO Inject 70 Units into the skin daily.   ketoconazole 2 % cream Commonly known as: NIZORAL Apply topically 2 (two) times a  day.   meclizine 25 MG tablet Commonly known as: ANTIVERT Take 1 tablet (25 mg total) by mouth 3 (three) times daily as needed for dizziness.   meloxicam 7.5 MG tablet Commonly known as: MOBIC Take 7.5 mg by mouth daily.   montelukast 10 MG tablet Commonly known as: SINGULAIR TAKE 1 TABLET (10 MG TOTAL) BY MOUTH DAILY   omeprazole 40 MG capsule Commonly known as: PRILOSEC Take 40 mg by mouth daily.   potassium chloride 10 MEQ tablet Commonly known as: KLOR-CON Take 10 mEq by mouth daily.   pregabalin 200 MG capsule Commonly known as: LYRICA TAKE 1 CAPSULE BY MOUTH 2 TIMES DAILY.   prochlorperazine 10 MG tablet Commonly known as: COMPAZINE TAKE 1 TABLET BY MOUTH EVERY 6 HOURS AS NEEDED FOR NAUSEA AND VOMITING   Tasigna 50 MG Caps Generic drug: Nilotinib HCl Take 50 mg by mouth daily.   torsemide 20 MG tablet Commonly known as: DEMADEX Take 1 tablet by mouth daily.   valACYclovir 500 MG tablet Commonly known as: VALTREX TAKE ONE TABLET BY MOUTH AT BEDTIME   valsartan 320 MG tablet Commonly known as: DIOVAN Take by mouth daily.       Allergies:  Allergies  Allergen Reactions  . Dulaglutide Nausea And Vomiting and Other (See Comments)  . Liraglutide Nausea And Vomiting  . Sulfur Nausea And Vomiting  . Metformin Diarrhea    Past Medical History, Surgical history, Social history, and Family History were reviewed and updated.  Review of Systems: Review of Systems  Constitutional: Negative.   Eyes: Negative.   Respiratory: Positive for cough.   Cardiovascular: Positive for palpitations.  Gastrointestinal: Positive for diarrhea.  Genitourinary: Negative.   Musculoskeletal: Positive for back pain and neck pain.  Skin: Positive for rash.  Neurological: Positive for headaches.  Endo/Heme/Allergies: Negative.   Psychiatric/Behavioral: Negative.      Physical Exam:  vitals were not taken for this visit.   Wt Readings from Last 3 Encounters:  12/21/19  262 lb (118.8 kg)  11/09/19 262 lb 1.3 oz (118.9 kg)  10/10/19 259 lb (117.5 kg)  Her vital signs are temperature of 97.9.  Pulse 81.  Blood pressure 118/58.  Weight is 269 pounds.  Physical Exam Vitals reviewed.  HENT:     Head: Normocephalic and atraumatic.  Eyes:     Pupils: Pupils are equal, round, and reactive to light.  Cardiovascular:     Rate and Rhythm: Normal rate and regular rhythm.     Heart sounds: Normal heart sounds.  Pulmonary:     Effort: Pulmonary effort is normal.     Breath sounds: Normal breath sounds.  Abdominal:     General: Bowel sounds are normal.     Palpations: Abdomen is soft.  Musculoskeletal:        General: No tenderness or deformity. Normal range of motion.  Cervical back: Normal range of motion.  Lymphadenopathy:     Cervical: No cervical adenopathy.  Skin:    General: Skin is warm and dry.     Findings: No erythema or rash.  Neurological:     Mental Status: She is alert and oriented to person, place, and time.  Psychiatric:        Behavior: Behavior normal.        Thought Content: Thought content normal.        Judgment: Judgment normal.      Lab Results  Component Value Date   WBC 15.7 (H) 01/23/2020   HGB 11.2 (L) 01/23/2020   HCT 35.2 (L) 01/23/2020   MCV 100.9 (H) 01/23/2020   PLT 485 (H) 01/23/2020   Lab Results  Component Value Date   FERRITIN 119 05/01/2019   IRON 77 05/01/2019   TIBC 286 05/01/2019   UIBC 209 05/01/2019   IRONPCTSAT 27 05/01/2019   Lab Results  Component Value Date   RETICCTPCT 1.4 05/01/2019   RBC 3.49 (L) 01/23/2020   No results found for: KPAFRELGTCHN, LAMBDASER, [facility]LAMBRATIO Lab Results  Component Value Date   IGGSERUM 675 (L) 11/10/2016   IGMSERUM 86 11/10/2016   No results found for: Odetta Pink, SPEI   Chemistry      Component Value Date/Time   NA 141 01/08/2020 1325   NA 144 01/27/2017 1302   NA 138 09/16/2016 1105   K 4.2  01/08/2020 1325   K 4.3 01/27/2017 1302   K 4.2 09/16/2016 1105   CL 100 01/08/2020 1325   CL 102 01/27/2017 1302   CO2 33 (H) 01/08/2020 1325   CO2 27 01/27/2017 1302   CO2 26 09/16/2016 1105   BUN 21 (H) 01/08/2020 1325   BUN 14 01/27/2017 1302   BUN 11.9 09/16/2016 1105   CREATININE 1.22 (H) 01/08/2020 1325   CREATININE 1.1 01/27/2017 1302   CREATININE 0.8 09/16/2016 1105      Component Value Date/Time   CALCIUM 9.0 01/08/2020 1325   CALCIUM 9.5 01/27/2017 1302   CALCIUM 9.0 09/16/2016 1105   ALKPHOS 92 01/08/2020 1325   ALKPHOS 116 (H) 01/27/2017 1302   ALKPHOS 136 09/16/2016 1105   AST 5 (L) 01/08/2020 1325   AST 8 09/16/2016 1105   ALT 8 01/08/2020 1325   ALT 25 01/27/2017 1302   ALT 12 09/16/2016 1105   BILITOT 0.6 01/08/2020 1325   BILITOT 0.32 09/16/2016 1105       Impression and Plan: Ms. Lemonds is a very pleasant 49 yo with chronic phase CML.  We had a really hard time try to treat this.  Hopefully, the very low-dose Tasigna is going to help.  Her white cell count is better.  Her platelet count is better.  As such, I have to believe that the BCR/ABL is going to be lower.  We will just keep her on the 50 mg dose of Tasigna.  I think this is reasonable.  She just has a exquisite sensitivity to TKI medications.  I would like to get her back after the holiday season.  I think we can get her back in 6 weeks.   [name], MD 11/24/20211:47 PM

## 2020-02-07 MED FILL — TASIGNA 50 MG CAPS: 50 | 30 days supply | Qty: 30 | Fill #2

## 2020-02-19 MED FILL — TASIGNA 50 MG CAPS: 50 | 30 days supply | Qty: 30 | Fill #2

## 2020-03-05 ENCOUNTER — Inpatient Hospital Stay: Payer: Medicaid Other | Admitting: Family

## 2020-03-05 ENCOUNTER — Inpatient Hospital Stay: Payer: Medicaid Other | Attending: Hematology & Oncology

## 2020-03-12 ENCOUNTER — Telehealth: Payer: Self-pay

## 2020-03-12 NOTE — Telephone Encounter (Signed)
Pt called to r/s her no show appts from 03/05/20     aom

## 2020-03-14 ENCOUNTER — Ambulatory Visit: Payer: Medicaid Other | Admitting: Family

## 2020-03-14 ENCOUNTER — Other Ambulatory Visit: Payer: Medicaid Other

## 2020-03-14 ENCOUNTER — Telehealth: Payer: Self-pay

## 2020-03-14 NOTE — Telephone Encounter (Signed)
Pt called in to r/s todays appts as she has been dx with covid   aom

## 2020-03-25 ENCOUNTER — Telehealth: Payer: Self-pay

## 2020-03-25 NOTE — Telephone Encounter (Signed)
S/w pt regarding r/s appts due to being in hosp + covid on 1/14, appts has been r/s to +21 days    Tabitha Estrada

## 2020-03-27 MED FILL — TASIGNA 50 MG CAPS: 50 | 30 days supply | Qty: 30 | Fill #3

## 2020-03-28 ENCOUNTER — Ambulatory Visit: Payer: Medicaid Other | Admitting: Family

## 2020-03-28 ENCOUNTER — Other Ambulatory Visit: Payer: Medicaid Other

## 2020-03-31 ENCOUNTER — Other Ambulatory Visit: Payer: Self-pay | Admitting: *Deleted

## 2020-03-31 DIAGNOSIS — C9210 Chronic myeloid leukemia, BCR/ABL-positive, not having achieved remission: Secondary | ICD-10-CM

## 2020-04-01 ENCOUNTER — Inpatient Hospital Stay: Payer: Medicaid Other | Attending: Hematology & Oncology

## 2020-04-01 ENCOUNTER — Other Ambulatory Visit: Payer: Self-pay

## 2020-04-01 DIAGNOSIS — C9210 Chronic myeloid leukemia, BCR/ABL-positive, not having achieved remission: Secondary | ICD-10-CM | POA: Insufficient documentation

## 2020-04-01 DIAGNOSIS — Z79899 Other long term (current) drug therapy: Secondary | ICD-10-CM | POA: Insufficient documentation

## 2020-04-01 DIAGNOSIS — R197 Diarrhea, unspecified: Secondary | ICD-10-CM | POA: Insufficient documentation

## 2020-04-01 DIAGNOSIS — E119 Type 2 diabetes mellitus without complications: Secondary | ICD-10-CM | POA: Diagnosis not present

## 2020-04-01 DIAGNOSIS — D509 Iron deficiency anemia, unspecified: Secondary | ICD-10-CM | POA: Insufficient documentation

## 2020-04-01 LAB — CBC WITH DIFFERENTIAL (CANCER CENTER ONLY)
Abs Immature Granulocytes: 16.8 10*3/uL — ABNORMAL HIGH (ref 0.00–0.07)
Band Neutrophils: 0 %
Basophils Absolute: 0.9 10*3/uL — ABNORMAL HIGH (ref 0.0–0.1)
Basophils Relative: 2 %
Eosinophils Absolute: 0.1 10*3/uL (ref 0.0–0.5)
Eosinophils Relative: 1 %
HCT: 31.9 % — ABNORMAL LOW (ref 36.0–46.0)
Hemoglobin: 10 g/dL — ABNORMAL LOW (ref 12.0–15.0)
Immature Granulocytes: 29 %
Lymphocytes Relative: 6 %
Lymphs Abs: 3.3 10*3/uL (ref 0.7–4.0)
MCH: 29.4 pg (ref 26.0–34.0)
MCHC: 31.3 g/dL (ref 30.0–36.0)
MCV: 93.8 fL (ref 80.0–100.0)
Monocytes Absolute: 2.1 10*3/uL — ABNORMAL HIGH (ref 0.1–1.0)
Monocytes Relative: 4 %
Neutro Abs: 34.1 10*3/uL — ABNORMAL HIGH (ref 1.7–7.7)
Neutrophils Relative %: 60 %
Platelet Count: 842 10*3/uL — ABNORMAL HIGH (ref 150–400)
RBC: 3.4 MIL/uL — ABNORMAL LOW (ref 3.87–5.11)
RDW: 18.3 % — ABNORMAL HIGH (ref 11.5–15.5)
WBC Count: 57.2 10*3/uL (ref 4.0–10.5)
nRBC: 0.2 % (ref 0.0–0.2)

## 2020-04-01 LAB — CMP (CANCER CENTER ONLY)
ALT: 13 U/L (ref 0–44)
AST: 11 U/L — ABNORMAL LOW (ref 15–41)
Albumin: 3.6 g/dL (ref 3.5–5.0)
Alkaline Phosphatase: 60 U/L (ref 38–126)
Anion gap: 9 (ref 5–15)
BUN: 14 mg/dL (ref 6–20)
CO2: 30 mmol/L (ref 22–32)
Calcium: 9.3 mg/dL (ref 8.9–10.3)
Chloride: 101 mmol/L (ref 98–111)
Creatinine: 1.15 mg/dL — ABNORMAL HIGH (ref 0.44–1.00)
GFR, Estimated: 59 mL/min — ABNORMAL LOW (ref >60)
Glucose, Bld: 149 mg/dL — ABNORMAL HIGH (ref 70–99)
Potassium: 3.4 mmol/L — ABNORMAL LOW (ref 3.5–5.1)
Sodium: 140 mmol/L (ref 135–145)
Total Bilirubin: 0.4 mg/dL (ref 0.3–1.2)
Total Protein: 6.4 g/dL — ABNORMAL LOW (ref 6.5–8.1)

## 2020-04-01 LAB — SAVE SMEAR(SSMR), FOR PROVIDER SLIDE REVIEW

## 2020-04-02 ENCOUNTER — Encounter: Payer: Self-pay | Admitting: Hematology & Oncology

## 2020-04-02 LAB — IRON AND TIBC
Iron: 31 ug/dL — ABNORMAL LOW (ref 41–142)
Saturation Ratios: 16 % — ABNORMAL LOW (ref 21–57)
TIBC: 198 ug/dL — ABNORMAL LOW (ref 236–444)
UIBC: 166 ug/dL (ref 120–384)

## 2020-04-04 ENCOUNTER — Other Ambulatory Visit: Payer: Medicaid Other

## 2020-04-04 ENCOUNTER — Inpatient Hospital Stay: Payer: Medicaid Other

## 2020-04-08 ENCOUNTER — Inpatient Hospital Stay: Payer: Medicaid Other | Admitting: Family

## 2020-04-08 ENCOUNTER — Other Ambulatory Visit: Payer: Medicaid Other

## 2020-04-09 ENCOUNTER — Other Ambulatory Visit: Payer: Self-pay

## 2020-04-09 ENCOUNTER — Encounter: Payer: Self-pay | Admitting: Hematology & Oncology

## 2020-04-09 ENCOUNTER — Other Ambulatory Visit: Payer: Self-pay | Admitting: Hematology & Oncology

## 2020-04-09 ENCOUNTER — Inpatient Hospital Stay (HOSPITAL_BASED_OUTPATIENT_CLINIC_OR_DEPARTMENT_OTHER): Payer: Medicaid Other | Admitting: Hematology & Oncology

## 2020-04-09 ENCOUNTER — Inpatient Hospital Stay: Payer: Medicaid Other

## 2020-04-09 VITALS — BP 121/59 | HR 77 | Temp 98.3°F | Resp 20 | Wt 270.0 lb

## 2020-04-09 DIAGNOSIS — C9210 Chronic myeloid leukemia, BCR/ABL-positive, not having achieved remission: Secondary | ICD-10-CM

## 2020-04-09 DIAGNOSIS — K591 Functional diarrhea: Secondary | ICD-10-CM | POA: Diagnosis not present

## 2020-04-09 LAB — CBC WITH DIFFERENTIAL (CANCER CENTER ONLY)
Abs Immature Granulocytes: 3.6 10*3/uL — ABNORMAL HIGH (ref 0.00–0.07)
Band Neutrophils: 5 %
Basophils Absolute: 0 10*3/uL (ref 0.0–0.1)
Basophils Relative: 0 %
Eosinophils Absolute: 0 10*3/uL (ref 0.0–0.5)
Eosinophils Relative: 0 %
HCT: 32.1 % — ABNORMAL LOW (ref 36.0–46.0)
Hemoglobin: 9.9 g/dL — ABNORMAL LOW (ref 12.0–15.0)
Lymphocytes Relative: 8 %
Lymphs Abs: 2.9 10*3/uL (ref 0.7–4.0)
MCH: 29.2 pg (ref 26.0–34.0)
MCHC: 30.8 g/dL (ref 30.0–36.0)
MCV: 94.7 fL (ref 80.0–100.0)
Metamyelocytes Relative: 10 %
Monocytes Absolute: 0.4 10*3/uL (ref 0.1–1.0)
Monocytes Relative: 1 %
Neutro Abs: 29.3 10*3/uL — ABNORMAL HIGH (ref 1.7–7.7)
Neutrophils Relative %: 76 %
Platelet Count: 582 10*3/uL — ABNORMAL HIGH (ref 150–400)
RBC: 3.39 MIL/uL — ABNORMAL LOW (ref 3.87–5.11)
RDW: 18 % — ABNORMAL HIGH (ref 11.5–15.5)
WBC Count: 36.2 10*3/uL — ABNORMAL HIGH (ref 4.0–10.5)
nRBC: 0.9 % — ABNORMAL HIGH (ref 0.0–0.2)
nRBC: 2 /100 WBC — ABNORMAL HIGH

## 2020-04-09 LAB — CMP (CANCER CENTER ONLY)
ALT: 14 U/L (ref 0–44)
AST: 10 U/L — ABNORMAL LOW (ref 15–41)
Albumin: 3.7 g/dL (ref 3.5–5.0)
Alkaline Phosphatase: 83 U/L (ref 38–126)
Anion gap: 8 (ref 5–15)
BUN: 13 mg/dL (ref 6–20)
CO2: 32 mmol/L (ref 22–32)
Calcium: 9.1 mg/dL (ref 8.9–10.3)
Chloride: 103 mmol/L (ref 98–111)
Creatinine: 1.14 mg/dL — ABNORMAL HIGH (ref 0.44–1.00)
GFR, Estimated: 59 mL/min — ABNORMAL LOW (ref >60)
Glucose, Bld: 112 mg/dL — ABNORMAL HIGH (ref 70–99)
Potassium: 4 mmol/L (ref 3.5–5.1)
Sodium: 143 mmol/L (ref 135–145)
Total Bilirubin: 0.4 mg/dL (ref 0.3–1.2)
Total Protein: 6.3 g/dL — ABNORMAL LOW (ref 6.5–8.1)

## 2020-04-09 LAB — LACTATE DEHYDROGENASE: LDH: 457 U/L — ABNORMAL HIGH (ref 98–192)

## 2020-04-09 LAB — SAVE SMEAR(SSMR), FOR PROVIDER SLIDE REVIEW

## 2020-04-09 MED ORDER — SCEMBLIX 40 MG PO TABS: 40.0000 mg | ORAL_TABLET | Freq: Two times a day (BID) | ORAL | 4 refills | Status: DC

## 2020-04-09 NOTE — Progress Notes (Signed)
Hematology and Oncology Follow Up Visit  Tabitha Estrada [id number] March 03, 1970 50 y.o. 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  She really has been quite busy since we last saw her.  She has been hospitalized twice.  She had COVID.  She really had a tough time with Covid.  She even had her vaccinations.  Thank God that she had her vaccinations or I am not sure if she would have survived.  However, I was called by one of the hospital doctors when she was in the hospital.  Her white cell count was quite high.  They had stopped her Tasigna.  It is hard to say how much she was off Tasigna.  She is back on Tasigna.  She is having problems with diarrhea.  She really has had a tough time due to side effects.  I really think that we are going to have to make a change.  Of note, when we last saw her or had her labs done, the BCR/ABL was 149%.  This is certainly not acceptable.  We will try her on the new, non- all TKI agent,Asciminib (Scemblix).  Hopefully she will be able to tolerate this.  We will start her at 40 mg p.o. twice daily.  She has a lot of other health issues.  She is diabetic.  She just really has issues and I just feel bad that she has to deal with everything else.  Again she is having diarrhea.  She has had this with all of the TKI drugs that she is taking.  I will call in some Lomotil refills.  Her mom comes in with her today.  I think her mom probably drove her today.  She has had no cough.   She has had no vomiting.  She has had no rashes.  There has been no bleeding.  Overall, I would say her performance status is ECOG 1.   Medications:  Allergies as of 04/09/2020      Reactions   Dulaglutide Nausea And Vomiting, Other (See Comments)   Elemental Sulfur Nausea And Vomiting   Liraglutide Nausea And Vomiting   Metformin Diarrhea   Sulfamethoxazole Nausea And Vomiting      Medication List       Accurate as of April 09, 2020  3:14 PM. If you have any questions, ask your nurse or doctor.        albuterol 108 (90 Base) MCG/ACT inhaler Commonly known as: VENTOLIN HFA Inhale 1-2 puffs into the lungs every 4 (four) hours as needed for wheezing or shortness of breath.   albuterol (2.5 MG/3ML) 0.083% nebulizer solution Commonly known as: PROVENTIL Inhale the contents of one vial in nebulizer every four to six hours as needed for cough or wheeze.   aluminum-magnesium hydroxide-simethicone 892-119-41 MG/5ML Susp Commonly known as: MAALOX Take by mouth daily as needed.   atorvastatin 80 MG tablet Commonly known as: LIPITOR TAKE 1 TABLET (80 MG TOTAL) BY MOUTH DAILY. NEEDS LAB WORK   Baqsimi One  Pack 3 MG/DOSE Powd Generic drug: Glucagon Place 1 spray into the nose as needed.   budesonide-formoterol 160-4.5 MCG/ACT inhaler Commonly known as: SYMBICORT Inhale 2 puffs into the lungs 2 (two) times daily.   buPROPion 75 MG tablet Commonly known as: WELLBUTRIN Take 75 mg by mouth 2 (two) times daily.   cholestyramine 4 g packet Commonly known as: QUESTRAN Take by mouth.   clobetasol ointment 0.05 % Commonly known as: TEMOVATE Apply 1 application topically 2 (two) times daily.   colesevelam 625 MG tablet Commonly known as: WELCHOL Take 1,875 mg by mouth 2 (two) times daily.   cyclobenzaprine 10 MG tablet Commonly known as: FLEXERIL Take 10 mg by mouth daily as needed.   Dexcom G6 Sensor Misc Apply topically.   dextromethorphan 30 MG/5ML liquid Commonly  known as: DELSYM Take by mouth.   diclofenac Sodium 1 % Gel Commonly known as: VOLTAREN SMARTSIG:Gram(s) Topical Twice Daily   diphenoxylate-atropine 2.5-0.025 MG tablet Commonly known as: LOMOTIL Take 1 tablet by mouth 4 (four) times daily as needed for diarrhea or loose stools (as needed after taking Sprycel for diarrhea).   fluticasone 50 MCG/ACT nasal spray Commonly known as: FLONASE Use two sprays in each nostril once daily as directed.   hyoscyamine 0.125 MG Tbdp disintergrating tablet Commonly known as: ANASPAZ Take by mouth.   insulin aspart 100 UNIT/ML FlexPen Commonly known as: NovoLOG FlexPen Inject 40 Units into the skin daily with supper. What changed:   how much to take  additional instructions   insulin glargine (1 Unit Dial) 300 UNIT/ML Solostar Pen Commonly known as: TOUJEO Inject 70 Units into the skin daily.   ketoconazole 2 % cream Commonly known as: NIZORAL Apply topically 2 (two) times a day.   meclizine 25 MG tablet Commonly known as: ANTIVERT Take 1 tablet (25 mg total) by mouth 3 (three) times daily as needed for dizziness.   meloxicam 7.5 MG tablet Commonly known as: MOBIC Take 7.5 mg by mouth daily.   montelukast 10 MG tablet Commonly known as: SINGULAIR TAKE 1 TABLET (10 MG TOTAL) BY MOUTH DAILY   mupirocin cream 2 % Commonly known as: Bactroban Apply 1 application topically 2 (two) times daily.   nicotine 21 mg/24hr patch Commonly known as: NICODERM CQ - dosed in mg/24 hours 21 mg daily.   omeprazole 40 MG capsule Commonly known as: PRILOSEC Take 40 mg by mouth daily.   potassium chloride 10 MEQ tablet Commonly known as: KLOR-CON Take 10 mEq by mouth daily.   pregabalin 200 MG capsule Commonly known as: LYRICA TAKE 1 CAPSULE BY MOUTH 2 TIMES DAILY.   prochlorperazine 10 MG tablet Commonly known as: COMPAZINE TAKE 1 TABLET BY MOUTH EVERY 6 HOURS AS NEEDED FOR NAUSEA AND VOMITING   Scemblix 40 MG Tabs Generic drug:  Asciminib HCl Take 40 mg by mouth 2 (two) times daily. Started by: Tabitha Napoleon, MD   Tasigna 50 MG Caps Generic drug: Nilotinib HCl Take 50 mg by mouth daily.   tiZANidine 4 MG tablet Commonly known as: ZANAFLEX Take by mouth daily as needed.   torsemide 20 MG tablet Commonly known as: DEMADEX Take 1 tablet by mouth daily.   valACYclovir 500 MG tablet Commonly known as: VALTREX TAKE ONE TABLET BY MOUTH AT BEDTIME   valsartan 320 MG tablet Commonly known as: DIOVAN Take by mouth daily.       Allergies:  Allergies  Allergen Reactions  . Dulaglutide Nausea And Vomiting and Other (See Comments)  .  Elemental Sulfur Nausea And Vomiting  . Liraglutide Nausea And Vomiting  . Metformin Diarrhea  . Sulfamethoxazole Nausea And Vomiting    Past Medical History, Surgical history, Social history, and Family History were reviewed and updated.  Review of Systems: Review of Systems  Constitutional: Negative.   Eyes: Negative.   Respiratory: Positive for cough.   Cardiovascular: Positive for palpitations.  Gastrointestinal: Positive for diarrhea.  Genitourinary: Negative.   Musculoskeletal: Positive for back pain and neck pain.  Skin: Positive for rash.  Neurological: Positive for headaches.  Endo/Heme/Allergies: Negative.   Psychiatric/Behavioral: Negative.      Physical Exam:  weight is 270 lb (122.5 kg). Her oral temperature is 98.3 F (36.8 C). Her blood pressure is 121/59 (abnormal) and her pulse is 77. Her respiration is 20 and oxygen saturation is 99%.   Wt Readings from Last 3 Encounters:  04/09/20 270 lb (122.5 kg)  01/23/20 269 lb (122 kg)  12/21/19 262 lb (118.8 kg)  Her vital signs are temperature of 97.9.  Pulse 81.  Blood pressure 118/58.  Weight is 269 pounds.  Physical Exam Vitals reviewed.  HENT:     Head: Normocephalic and atraumatic.  Eyes:     Pupils: Pupils are equal, round, and reactive to light.  Cardiovascular:     Rate and Rhythm:  Normal rate and regular rhythm.     Heart sounds: Normal heart sounds.  Pulmonary:     Effort: Pulmonary effort is normal.     Breath sounds: Normal breath sounds.  Abdominal:     General: Bowel sounds are normal.     Palpations: Abdomen is soft.  Musculoskeletal:        General: No tenderness or deformity. Normal range of motion.     Cervical back: Normal range of motion.  Lymphadenopathy:     Cervical: No cervical adenopathy.  Skin:    General: Skin is warm and dry.     Findings: No erythema or rash.  Neurological:     Mental Status: She is alert and oriented to person, place, and time.  Psychiatric:        Behavior: Behavior normal.        Thought Content: Thought content normal.        Judgment: Judgment normal.      Lab Results  Component Value Date   WBC 36.2 (H) 04/09/2020   HGB 9.9 (L) 04/09/2020   HCT 32.1 (L) 04/09/2020   MCV 94.7 04/09/2020   PLT 582 (H) 04/09/2020   Lab Results  Component Value Date   FERRITIN 206 01/23/2020   IRON 31 (L) 04/01/2020   TIBC 198 (L) 04/01/2020   UIBC 166 04/01/2020   IRONPCTSAT 16 (L) 04/01/2020   Lab Results  Component Value Date   RETICCTPCT 1.4 05/01/2019   RBC 3.39 (L) 04/09/2020   No results found for: KPAFRELGTCHN, LAMBDASER, KAPLAMBRATIO Lab Results  Component Value Date   IGGSERUM 675 (L) 11/10/2016   IGMSERUM 86 11/10/2016   No results found for: Odetta Pink, SPEI   Chemistry      Component Value Date/Time   NA 143 04/09/2020 1344   NA 144 01/27/2017 1302   NA 138 09/16/2016 1105   K 4.0 04/09/2020 1344   K 4.3 01/27/2017 1302   K 4.2 09/16/2016 1105   CL 103 04/09/2020 1344   CL 102 01/27/2017 1302   CO2 32 04/09/2020 1344   CO2 27 01/27/2017 1302   CO2  26 09/16/2016 1105   BUN 13 04/09/2020 1344   BUN 14 01/27/2017 1302   BUN 11.9 09/16/2016 1105   CREATININE 1.14 (H) 04/09/2020 1344   CREATININE 1.1 01/27/2017 1302   CREATININE 0.8  09/16/2016 1105      Component Value Date/Time   CALCIUM 9.1 04/09/2020 1344   CALCIUM 9.5 01/27/2017 1302   CALCIUM 9.0 09/16/2016 1105   ALKPHOS 83 04/09/2020 1344   ALKPHOS 116 (H) 01/27/2017 1302   ALKPHOS 136 09/16/2016 1105   AST 10 (L) 04/09/2020 1344   AST 8 09/16/2016 1105   ALT 14 04/09/2020 1344   ALT 25 01/27/2017 1302   ALT 12 09/16/2016 1105   BILITOT 0.4 04/09/2020 1344   BILITOT 0.32 09/16/2016 1105       Impression and Plan: Ms. Pollet is a very pleasant 50 yo with chronic phase CML.  We had a really hard time try to treat this.  We are going to make a switch over to Scemblix.  Hopefully, she will tolerate this.  Hopefully it will work.  Of note, I did send off a mutation analysis.  There is no T315I mutation.  As such, we do not need high dose of Scemblix.  I realize that we have a lot of other issues to help with.  I just feel bad that she is dealing with so much.  Regarding have to get her back in about 3 weeks just to make sure everything is doing okay with her.  The BCR/ABL was quite high.  When I looked at her blood smear, I did see some immature myeloid cells.  There were what appeared to be a couple blasts.  As such, it is conceivable that she might be trying to progress to a more "accelerated phase."  This is quite complicated.  I spent a good 45 minutes with she and her mom.  Given all that she has been through, we had to deal with a lot of issues.    Tabitha Napoleon, MD 2/9/20223:14 PM

## 2020-04-10 ENCOUNTER — Other Ambulatory Visit: Payer: Self-pay | Admitting: *Deleted

## 2020-04-10 ENCOUNTER — Encounter: Payer: Self-pay | Admitting: *Deleted

## 2020-04-10 ENCOUNTER — Telehealth: Payer: Self-pay | Admitting: Pharmacy Technician

## 2020-04-10 ENCOUNTER — Telehealth: Payer: Self-pay | Admitting: Pharmacist

## 2020-04-10 DIAGNOSIS — C9210 Chronic myeloid leukemia, BCR/ABL-positive, not having achieved remission: Secondary | ICD-10-CM

## 2020-04-10 DIAGNOSIS — K591 Functional diarrhea: Secondary | ICD-10-CM

## 2020-04-10 LAB — FERRITIN: Ferritin: 334 ng/mL — ABNORMAL HIGH (ref 11–307)

## 2020-04-10 MED ORDER — DIPHENOXYLATE-ATROPINE 2.5-0.025 MG PO TABS: 1.0000 | ORAL_TABLET | Freq: Four times a day (QID) | ORAL | 1 refills | Status: DC | PRN

## 2020-04-10 NOTE — Telephone Encounter (Signed)
Oral Oncology Pharmacist Encounter  Received new prescription for Scemblix (asciminib) for the treatment of CML planned duration until disease progression or unacceptable drug toxicity.  Labs from 04/09/20 assessed and are okay to initiate Scemblix. Prescription dose and frequency assessed.   Current medication list in Epic reviewed, potential DDIs with torsemide and meloxicam identified as Scemblix may inhibit their metabolism therefore increasing their serum concentrations and potentially toxicities. No changes to therapy recommended at this point, monitor patient for development of side effects from torsemide and meloxicam.   Evaluated chart and no patient barriers to medication adherence identified.   Prescription has been e-scribed to the [facility] for benefits analysis and approval.  Oral Oncology Clinic will continue to follow for insurance authorization, copayment issues, initial counseling and start date.  Eddie Candle, PharmD, BCPS PGY2 Hematology/Oncology Pharmacy Resident Oral Chemotherapy Navigation Clinic 04/10/2020 8:36 AM

## 2020-04-10 NOTE — Telephone Encounter (Signed)
Oral Oncology Patient Advocate Encounter  After completing a benefits investigation, prior authorization for Scemblix is not required at this time through [facility] plan.  Patient's copay is $0.00  [facility] [place] Phone [phone] Fax [phone] 04/10/2020 10:29 AM .

## 2020-04-11 LAB — BCR/ABL

## 2020-04-11 LAB — TKI RESISTANCE BY PCR

## 2020-04-11 MED FILL — SCEMBLIX 40 MG TABS: 40 | 30 days supply | Qty: 60 | Fill #0

## 2020-04-11 NOTE — Telephone Encounter (Signed)
Oral Chemotherapy Pharmacist Encounter  Patient Education I spoke with patient for overview of new oral chemotherapy medication: Scemblix (asciminib) for the treatment of CML planned duration until disease progression or unacceptable drug toxicity.  Pt is doing well. Counseled patient on administration, dosing, side effects, monitoring, drug-food interactions, safe handling, storage, and disposal.  Patient will take 40 mg (one tablet) twice daily, without food.   Side effects include but not limited to: myelosuppression, myalgias, and fatigue.    Reviewed with patient importance of keeping a medication schedule and plan for any missed doses.   After discussion with patient no patient barriers to medication adherence identified.   Of note, discussed potential DDI with Scemblix and torsemide and meloxicam. Meloxicam is currently a PRN medication that she infrequently takes but she will alert [place] if she begins taking it more regularly. She takes torsemide daily and was educated to alert [place] if she finds her urination has increased to the point she feels dehydrated, as this could be a sign of supratherapeutic torsemide exposure.    Ms. Mulhall voiced understanding and appreciation. All questions answered. Medication handout provided.  Provided patient with [facility] phone number. Patient knows to call the office with questions or concerns. Oral Chemotherapy Navigation Clinic will continue to follow.   Eddie Candle, PharmD, BCPS PGY2 Hematology/Oncology Pharmacy Resident Oral Chemotherapy Navigation Clinic 04/11/2020 1:00 PM

## 2020-04-14 NOTE — Telephone Encounter (Signed)
Scheduled first fill of Scemblix for 2/11 from [facility].  Medication was delivered on 04/12/20.  [place] [facility] [place] Phone [phone] Fax [phone] 04/14/2020 11:33 AM

## 2020-04-16 LAB — BCR/ABL

## 2020-04-17 ENCOUNTER — Encounter: Payer: Self-pay | Admitting: Hematology & Oncology

## 2020-04-17 ENCOUNTER — Telehealth: Payer: Self-pay

## 2020-04-17 ENCOUNTER — Inpatient Hospital Stay: Payer: Medicaid Other

## 2020-04-17 NOTE — Telephone Encounter (Signed)
Called patient back and informed her Dr.Ennever recommends patient goes straight to ED. Patient verbalized understanding and will go to ED.

## 2020-04-17 NOTE — Telephone Encounter (Signed)
Pt called in stating that she had to r/s todays iron appt due to diarrhea, appt has been r/s to 2/21 and her call was trans to the desk nurse    Trevell Pariseau

## 2020-04-18 ENCOUNTER — Telehealth: Payer: Self-pay

## 2020-04-18 NOTE — Telephone Encounter (Signed)
Pts Mother called to cancel pts iron tx for 04/21/20 as she was admitted to [facility] 04/17/20 for a blood clot in her neck.  Pt is meeting with neuro surg today.

## 2020-04-21 ENCOUNTER — Ambulatory Visit: Payer: Medicaid Other

## 2020-05-01 ENCOUNTER — Other Ambulatory Visit: Payer: Self-pay

## 2020-05-01 ENCOUNTER — Inpatient Hospital Stay: Payer: Medicaid Other | Attending: Hematology & Oncology

## 2020-05-01 ENCOUNTER — Encounter: Payer: Self-pay | Admitting: Hematology & Oncology

## 2020-05-01 ENCOUNTER — Inpatient Hospital Stay (HOSPITAL_BASED_OUTPATIENT_CLINIC_OR_DEPARTMENT_OTHER): Payer: Medicaid Other | Admitting: Hematology & Oncology

## 2020-05-01 VITALS — BP 107/58 | HR 87 | Temp 98.2°F | Resp 20 | Wt 264.0 lb

## 2020-05-01 DIAGNOSIS — E119 Type 2 diabetes mellitus without complications: Secondary | ICD-10-CM | POA: Diagnosis not present

## 2020-05-01 DIAGNOSIS — C9210 Chronic myeloid leukemia, BCR/ABL-positive, not having achieved remission: Secondary | ICD-10-CM | POA: Insufficient documentation

## 2020-05-01 DIAGNOSIS — Z79899 Other long term (current) drug therapy: Secondary | ICD-10-CM | POA: Diagnosis not present

## 2020-05-01 DIAGNOSIS — D509 Iron deficiency anemia, unspecified: Secondary | ICD-10-CM | POA: Diagnosis present

## 2020-05-01 LAB — CBC WITH DIFFERENTIAL (CANCER CENTER ONLY)
Abs Immature Granulocytes: 0.25 10*3/uL — ABNORMAL HIGH (ref 0.00–0.07)
Basophils Absolute: 0.2 10*3/uL — ABNORMAL HIGH (ref 0.0–0.1)
Basophils Relative: 2 %
Eosinophils Absolute: 0 10*3/uL (ref 0.0–0.5)
Eosinophils Relative: 0 %
HCT: 32.3 % — ABNORMAL LOW (ref 36.0–46.0)
Hemoglobin: 10 g/dL — ABNORMAL LOW (ref 12.0–15.0)
Immature Granulocytes: 3 %
Lymphocytes Relative: 12 %
Lymphs Abs: 1.2 10*3/uL (ref 0.7–4.0)
MCH: 28.7 pg (ref 26.0–34.0)
MCHC: 31 g/dL (ref 30.0–36.0)
MCV: 92.6 fL (ref 80.0–100.0)
Monocytes Absolute: 0.3 10*3/uL (ref 0.1–1.0)
Monocytes Relative: 3 %
Neutro Abs: 8.2 10*3/uL — ABNORMAL HIGH (ref 1.7–7.7)
Neutrophils Relative %: 80 %
Platelet Count: 389 10*3/uL (ref 150–400)
RBC: 3.49 MIL/uL — ABNORMAL LOW (ref 3.87–5.11)
RDW: 18.5 % — ABNORMAL HIGH (ref 11.5–15.5)
WBC Count: 10.2 10*3/uL (ref 4.0–10.5)
nRBC: 1.3 % — ABNORMAL HIGH (ref 0.0–0.2)

## 2020-05-01 LAB — CMP (CANCER CENTER ONLY)
ALT: 8 U/L (ref 0–44)
AST: 8 U/L — ABNORMAL LOW (ref 15–41)
Albumin: 3.8 g/dL (ref 3.5–5.0)
Alkaline Phosphatase: 81 U/L (ref 38–126)
Anion gap: 10 (ref 5–15)
BUN: 14 mg/dL (ref 6–20)
CO2: 32 mmol/L (ref 22–32)
Calcium: 8.6 mg/dL — ABNORMAL LOW (ref 8.9–10.3)
Chloride: 99 mmol/L (ref 98–111)
Creatinine: 1.19 mg/dL — ABNORMAL HIGH (ref 0.44–1.00)
GFR, Estimated: 56 mL/min — ABNORMAL LOW (ref >60)
Glucose, Bld: 190 mg/dL — ABNORMAL HIGH (ref 70–99)
Potassium: 3.4 mmol/L — ABNORMAL LOW (ref 3.5–5.1)
Sodium: 141 mmol/L (ref 135–145)
Total Bilirubin: 0.5 mg/dL (ref 0.3–1.2)
Total Protein: 6.5 g/dL (ref 6.5–8.1)

## 2020-05-01 LAB — SAVE SMEAR(SSMR), FOR PROVIDER SLIDE REVIEW

## 2020-05-01 LAB — LACTATE DEHYDROGENASE: LDH: 271 U/L — ABNORMAL HIGH (ref 98–192)

## 2020-05-01 NOTE — Progress Notes (Signed)
Hematology and Oncology Follow Up Visit  Tabitha Estrada [id number] 03-22-1970 50 y.o. 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  9 unfortunately, since we last saw her, she has been hospitalized again.  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  She had to go back to see vascular surgery and neurosurgery.  She has weakness on the right side, mostly in the right leg.  I do not think this is anything related to the Scemblix that she is taking.  She is only taking Scemblix daily.  I do think it is working nicely because her white cell count is down and her platelet count is down.  Again, she has so many other health issues.  Diabetes is the biggest issue that she has.  She has had no fever.  She has had no obvious bleeding.  There is no bowel or bladder incontinence.  Currently, I would say performance status is ECOG 1.    Medications:  Allergies as of 05/01/2020      Reactions   Dulaglutide Nausea And Vomiting, Other (See Comments)   Elemental Sulfur Nausea And Vomiting   Liraglutide Nausea And Vomiting   Metformin Diarrhea   Sulfamethoxazole Nausea And Vomiting      Medication List       Accurate as of May 01, 2020  2:57 PM. If you have any questions, ask your nurse or doctor.        STOP taking these medications   Baqsimi One Pack 3 MG/DOSE Powd Generic drug: Glucagon Stopped by: [name], MD   buPROPion 75 MG tablet Commonly known as: WELLBUTRIN Stopped by: [name], MD   mupirocin cream 2 % Commonly known as: Bactroban Stopped by: [name], MD   potassium chloride 10 MEQ tablet Commonly known as: KLOR-CON Stopped by: [name], MD     TAKE these medications   albuterol 108 (90 Base) MCG/ACT inhaler Commonly known as: VENTOLIN HFA Inhale 1-2 puffs into the lungs every 4 (four) hours as needed for wheezing or shortness of breath.   albuterol (2.5 MG/3ML) 0.083% nebulizer solution Commonly known as: PROVENTIL Inhale the contents of one vial in nebulizer every four to six hours as needed for cough or wheeze.   aluminum-magnesium hydroxide-simethicone 785-885-02 MG/5ML Susp Commonly known as: MAALOX Take by mouth daily as needed.   Aspirin Low Dose 81 MG EC tablet Generic drug: aspirin Take 81 mg by mouth daily.   atorvastatin 80 MG tablet Commonly known as: LIPITOR TAKE 1 TABLET (80 MG TOTAL) BY MOUTH DAILY. NEEDS LAB WORK   budesonide-formoterol 160-4.5 MCG/ACT inhaler Commonly known as: SYMBICORT Inhale 2 puffs into the lungs 2 (two) times daily.   clobetasol ointment 0.05 % Commonly known as: TEMOVATE Apply 1 application topically 2 (two) times daily.   clopidogrel 75 MG tablet Commonly known as: PLAVIX Take 75 mg by mouth daily.   colesevelam 625 MG tablet Commonly known as: WELCHOL Take 1,875 mg  by mouth 2 (two) times daily.   cyclobenzaprine 10 MG tablet Commonly known as: FLEXERIL Take 10 mg by mouth daily as needed.   Dexcom G6 Sensor Misc Apply topically.   dextromethorphan 30 MG/5ML liquid Commonly known as: DELSYM Take by mouth.   diclofenac Sodium 1 % Gel Commonly known as: VOLTAREN SMARTSIG:Gram(s) Topical Twice Daily   diphenoxylate-atropine 2.5-0.025 MG tablet Commonly known as: LOMOTIL Take 1 tablet by mouth 4 (four) times daily as needed for diarrhea or loose stools (as needed after taking Sprycel for diarrhea).   fluticasone 50 MCG/ACT nasal spray Commonly known as:  FLONASE Use two sprays in each nostril once daily as directed.   hyoscyamine 0.125 MG Tbdp disintergrating tablet Commonly known as: ANASPAZ Take by mouth.   insulin aspart 100 UNIT/ML FlexPen Commonly known as: NovoLOG FlexPen Inject 40 Units into the skin daily with supper. What changed:   how much to take  additional instructions   insulin glargine (1 Unit Dial) 300 UNIT/ML Solostar Pen Commonly known as: TOUJEO Inject 70 Units into the skin daily.   ketoconazole 2 % cream Commonly known as: NIZORAL Apply topically 2 (two) times a day.   meclizine 25 MG tablet Commonly known as: ANTIVERT Take 1 tablet (25 mg total) by mouth 3 (three) times daily as needed for dizziness.   meloxicam 7.5 MG tablet Commonly known as: MOBIC Take 7.5 mg by mouth daily.   montelukast 10 MG tablet Commonly known as: SINGULAIR TAKE 1 TABLET (10 MG TOTAL) BY MOUTH DAILY   nicotine 21 mg/24hr patch Commonly known as: NICODERM CQ - dosed in mg/24 hours 21 mg daily.   omeprazole 40 MG capsule Commonly known as: PRILOSEC Take 40 mg by mouth daily.   pregabalin 200 MG capsule Commonly known as: LYRICA TAKE 1 CAPSULE BY MOUTH 2 TIMES DAILY.   prochlorperazine 10 MG tablet Commonly known as: COMPAZINE TAKE 1 TABLET BY MOUTH EVERY 6 HOURS AS NEEDED FOR NAUSEA AND VOMITING   Scemblix 40 MG Tabs Generic drug: Asciminib HCl Take 40 mg by mouth 2 (two) times daily.   tiZANidine 4 MG tablet Commonly known as: ZANAFLEX Take by mouth daily as needed.   torsemide 20 MG tablet Commonly known as: DEMADEX Take 1 tablet by mouth daily.   valACYclovir 500 MG tablet Commonly known as: VALTREX TAKE ONE TABLET BY MOUTH AT BEDTIME   valsartan 320 MG tablet Commonly known as: DIOVAN Take by mouth daily.       Allergies:  Allergies  Allergen Reactions  . Dulaglutide Nausea And Vomiting and Other (See Comments)  . Elemental Sulfur Nausea And Vomiting  . Liraglutide Nausea And Vomiting   . Metformin Diarrhea  . Sulfamethoxazole Nausea And Vomiting    Past Medical History, Surgical history, Social history, and Family History were reviewed and updated.  Review of Systems: Review of Systems  Constitutional: Negative.   Eyes: Negative.   Respiratory: Positive for cough.   Cardiovascular: Positive for palpitations.  Gastrointestinal: Positive for diarrhea.  Genitourinary: Negative.   Musculoskeletal: Positive for back pain and neck pain.  Skin: Positive for rash.  Neurological: Positive for headaches.  Endo/Heme/Allergies: Negative.   Psychiatric/Behavioral: Negative.      Physical Exam:  weight is 264 lb (119.7 kg). Her oral temperature is 98.2 F (36.8 C). Her blood pressure is 107/58 (abnormal) and her pulse is 87. Her respiration is 20 and oxygen saturation is 96%.   Wt Readings from Last 3 Encounters:  05/01/20 264 lb (119.7  kg)  04/09/20 270 lb (122.5 kg)  01/23/20 269 lb (122 kg)  Her vital signs are temperature of 97.9.  Pulse 81.  Blood pressure 118/58.  Weight is 269 pounds.  Physical Exam Vitals reviewed.  HENT:     Head: Normocephalic and atraumatic.  Eyes:     Pupils: Pupils are equal, round, and reactive to light.  Cardiovascular:     Rate and Rhythm: Normal rate and regular rhythm.     Heart sounds: Normal heart sounds.  Pulmonary:     Effort: Pulmonary effort is normal.     Breath sounds: Normal breath sounds.  Abdominal:     General: Bowel sounds are normal.     Palpations: Abdomen is soft.  Musculoskeletal:        General: No tenderness or deformity. Normal range of motion.     Cervical back: Normal range of motion.  Lymphadenopathy:     Cervical: No cervical adenopathy.  Skin:    General: Skin is warm and dry.     Findings: No erythema or rash.  Neurological:     Mental Status: She is alert and oriented to person, place, and time.  Psychiatric:        Behavior: Behavior normal.        Thought Content: Thought content normal.         Judgment: Judgment normal.      Lab Results  Component Value Date   WBC 10.2 05/01/2020   HGB 10.0 (L) 05/01/2020   HCT 32.3 (L) 05/01/2020   MCV 92.6 05/01/2020   PLT 389 05/01/2020   Lab Results  Component Value Date   FERRITIN 334 (H) 04/09/2020   IRON 31 (L) 04/01/2020   TIBC 198 (L) 04/01/2020   UIBC 166 04/01/2020   IRONPCTSAT 16 (L) 04/01/2020   Lab Results  Component Value Date   RETICCTPCT 1.4 05/01/2019   RBC 3.49 (L) 05/01/2020   No results found for: Nils Pyle, [facility] Lab Results  Component Value Date   IGGSERUM 675 (L) 11/10/2016   IGMSERUM 86 11/10/2016   No results found for: Odetta Pink, SPEI   Chemistry      Component Value Date/Time   NA 143 04/09/2020 1344   NA 144 01/27/2017 1302   NA 138 09/16/2016 1105   K 4.0 04/09/2020 1344   K 4.3 01/27/2017 1302   K 4.2 09/16/2016 1105   CL 103 04/09/2020 1344   CL 102 01/27/2017 1302   CO2 32 04/09/2020 1344   CO2 27 01/27/2017 1302   CO2 26 09/16/2016 1105   BUN 13 04/09/2020 1344   BUN 14 01/27/2017 1302   BUN 11.9 09/16/2016 1105   CREATININE 1.14 (H) 04/09/2020 1344   CREATININE 1.1 01/27/2017 1302   CREATININE 0.8 09/16/2016 1105      Component Value Date/Time   CALCIUM 9.1 04/09/2020 1344   CALCIUM 9.5 01/27/2017 1302   CALCIUM 9.0 09/16/2016 1105   ALKPHOS 83 04/09/2020 1344   ALKPHOS 116 (H) 01/27/2017 1302   ALKPHOS 136 09/16/2016 1105   AST 10 (L) 04/09/2020 1344   AST 8 09/16/2016 1105   ALT 14 04/09/2020 1344   ALT 25 01/27/2017 1302   ALT 12 09/16/2016 1105   BILITOT 0.4 04/09/2020 1344   BILITOT 0.32 09/16/2016 1105       Impression and Plan: Ms. Celestin is a very pleasant 50 yo with chronic phase CML.  We had a really hard  time try to treat this.  We are going to make a switch over to Scemblix.  Hopefully, she will tolerate this.  Hopefully it will work.  Of note, I did send off a mutation  analysis.  There is no T315I mutation.  As such, we do not need high dose of Scemblix.  I had the fact that she had this CVA.  She is on aspirin and Plavix right now.  It is a baby aspirin.  She does have the weakness in the right lower leg.  She seems to be get around okay.  If she needs surgery for the carotid occlusion, I will see any problems with her having this.  I probably would have her stop the Scemblix a week before she has surgery.  He will be incredibly interesting to see what her BCR/ABL level is.  Again she is on Scemblix once a day.  We will keep her on once a day for right now.  I will plan to get her back to see [place] in about 6 weeks now.    [name], MD 3/3/20222:57 PM

## 2020-05-02 LAB — IRON AND TIBC
Iron: 38 ug/dL — ABNORMAL LOW (ref 41–142)
Saturation Ratios: 15 % — ABNORMAL LOW (ref 21–57)
TIBC: 261 ug/dL (ref 236–444)
UIBC: 223 ug/dL (ref 120–384)

## 2020-05-02 LAB — FERRITIN: Ferritin: 185 ng/mL (ref 11–307)

## 2020-05-08 LAB — BCR/ABL

## 2020-05-28 IMAGING — MG DIGITAL SCREENING BILAT W/ TOMO W/ CAD
6 of 10 series · 6 of 30 positions shown · non-contrast
Comparison: Previous exam(s).

CLINICAL DATA: Screening.

EXAM:
DIGITAL SCREENING BILATERAL MAMMOGRAM WITH TOMO AND CAD

[R CC synth-2D]
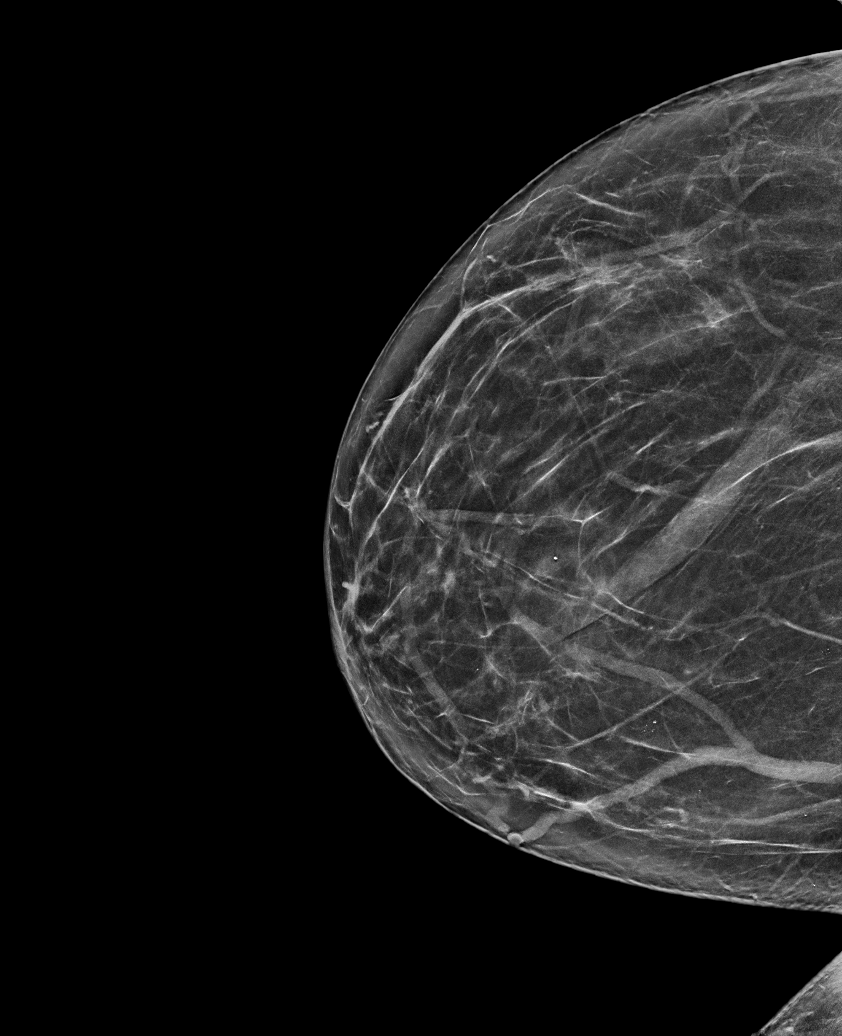

[R MLO synth-2D]
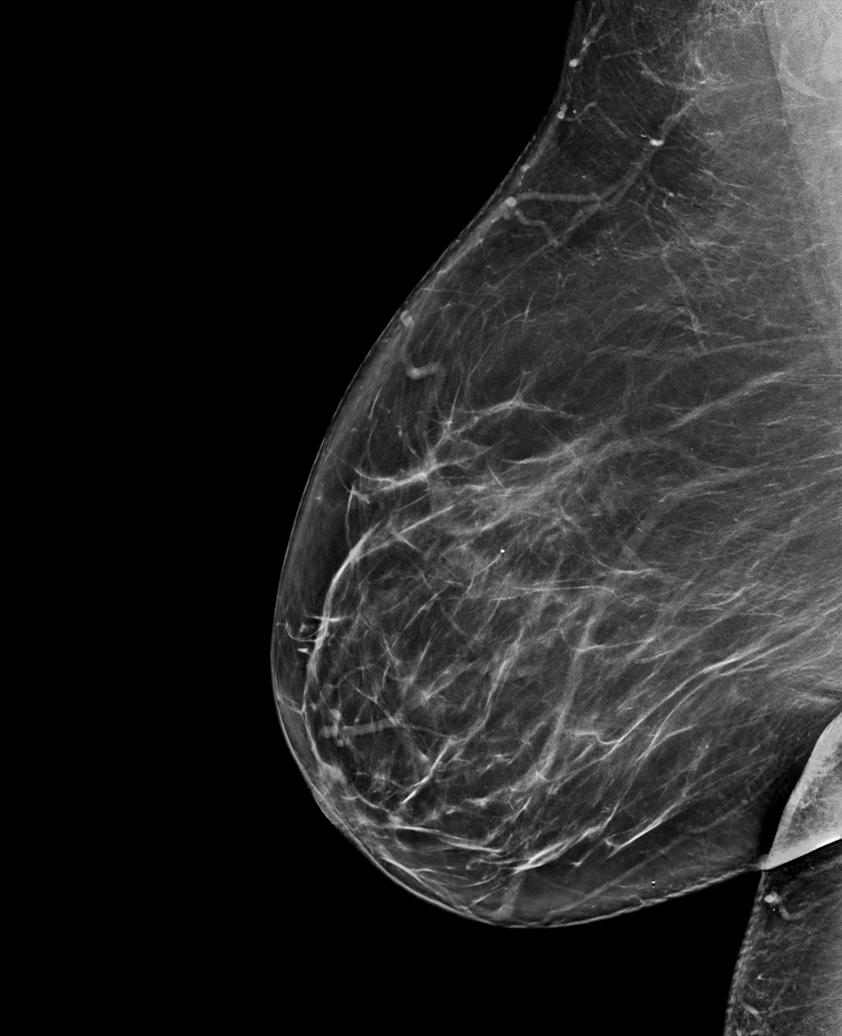

[L MLO synth-2D]
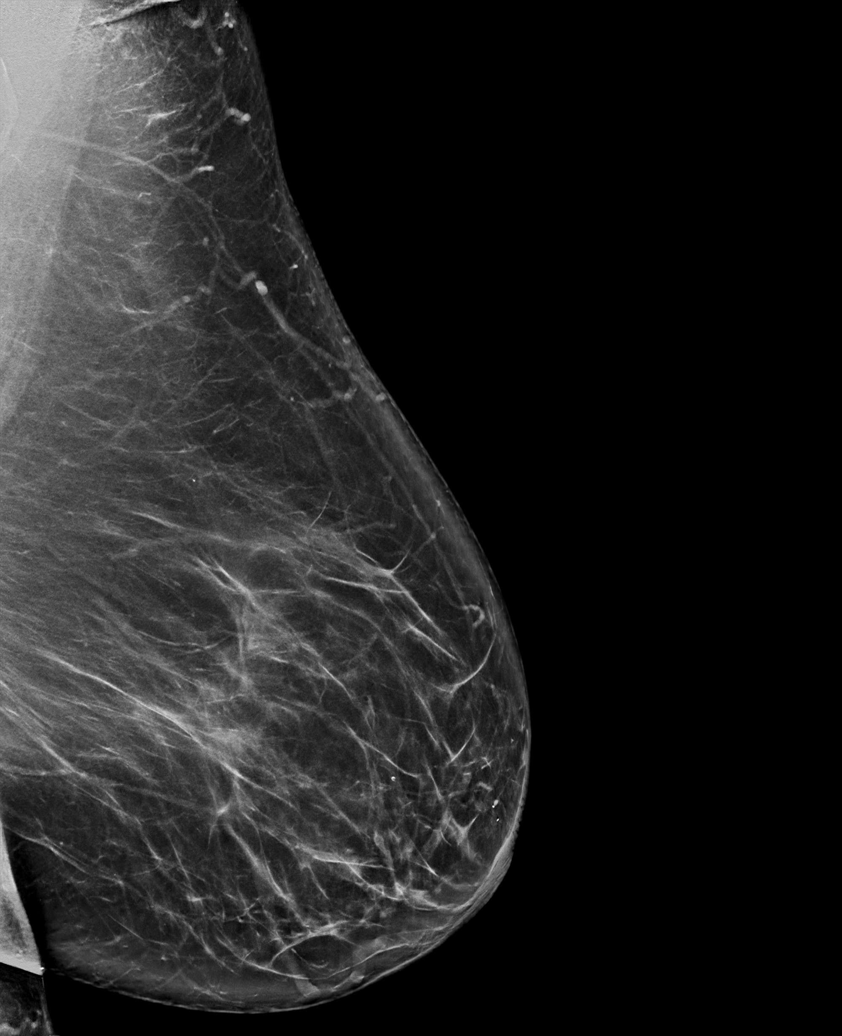

[L CC synth-2D]
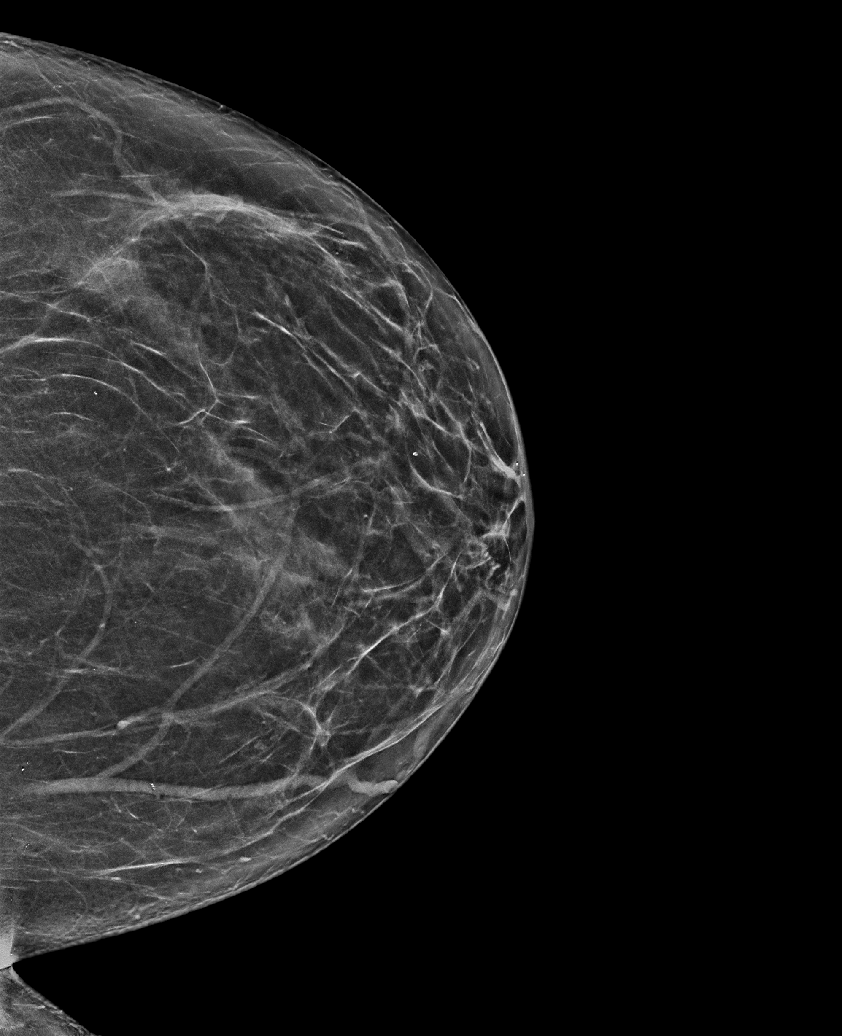

[R XCCM synth-2D]
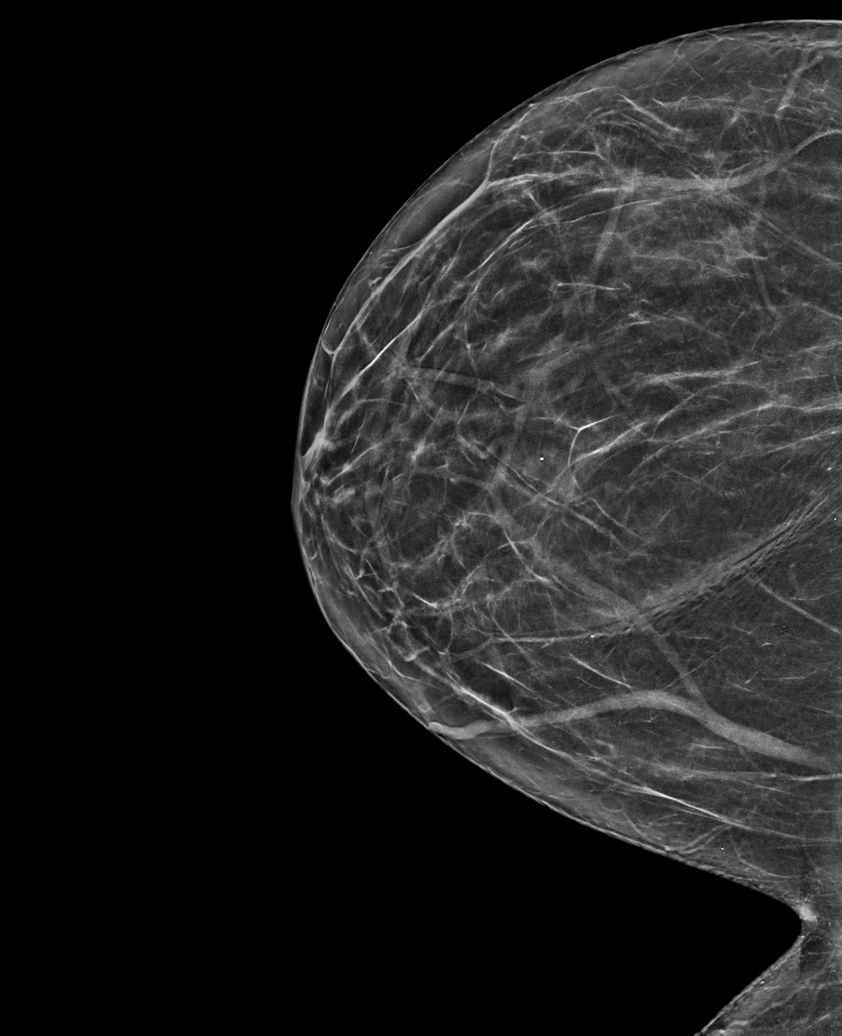

[L CC tomo · tomo slice 35/68.0]
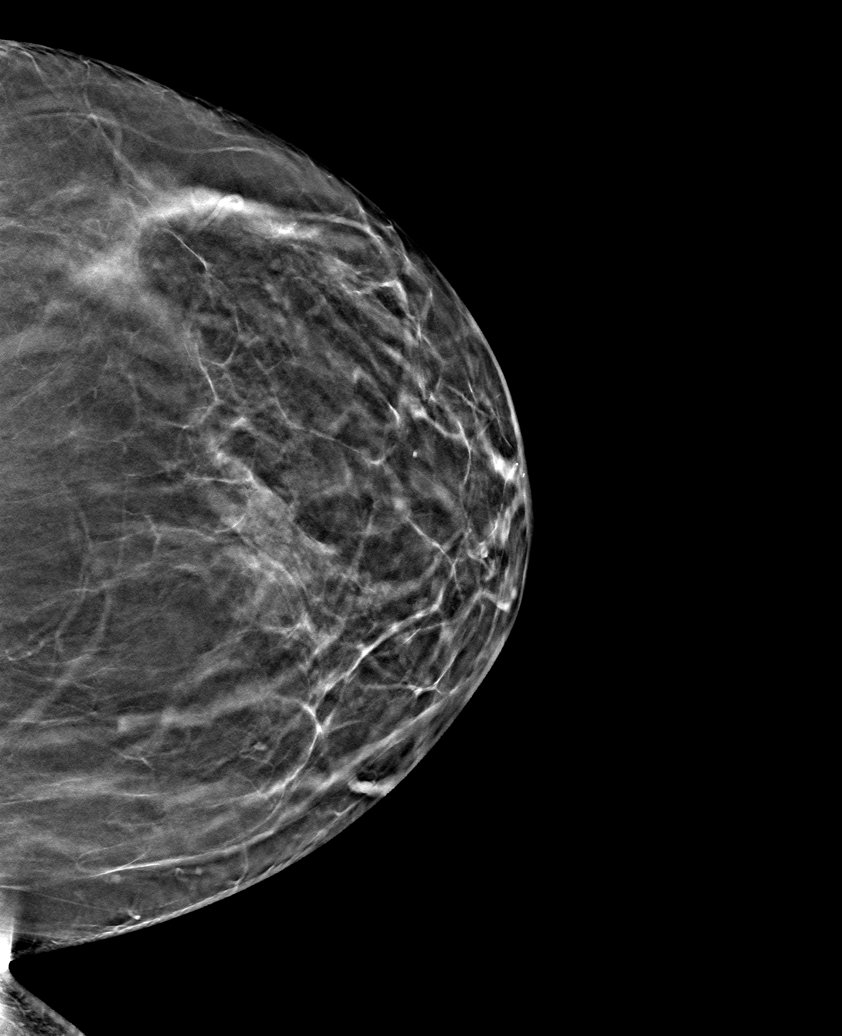

[6 of 30 positions shown; findings below may reference images not displayed]

ACR Breast Density Category b: There are scattered areas of
fibroglandular density.
FINDINGS: There are no findings suspicious for malignancy. Images were
processed with CAD.
IMPRESSION: No mammographic evidence of malignancy. A result letter of this
screening mammogram will be mailed directly to the patient.

RECOMMENDATION:
Screening mammogram in one year. (Code:[id number])

BI-RADS CATEGORY  1: Negative.

## 2020-05-29 ENCOUNTER — Other Ambulatory Visit (HOSPITAL_COMMUNITY): Payer: Self-pay

## 2020-06-06 ENCOUNTER — Other Ambulatory Visit (HOSPITAL_COMMUNITY): Payer: Self-pay

## 2020-06-09 ENCOUNTER — Other Ambulatory Visit: Payer: Self-pay | Admitting: Hematology & Oncology

## 2020-06-09 ENCOUNTER — Other Ambulatory Visit (HOSPITAL_COMMUNITY): Payer: Self-pay

## 2020-06-09 MED ORDER — SCEMBLIX 40 MG PO TABS
ORAL_TABLET | ORAL | 4 refills | Status: DC
  Filled 2020-06-09: qty 60, 30d supply, fill #0

## 2020-06-12 ENCOUNTER — Inpatient Hospital Stay (HOSPITAL_BASED_OUTPATIENT_CLINIC_OR_DEPARTMENT_OTHER): Payer: Medicaid Other | Admitting: Hematology & Oncology

## 2020-06-12 ENCOUNTER — Other Ambulatory Visit: Payer: Self-pay

## 2020-06-12 ENCOUNTER — Encounter: Payer: Self-pay | Admitting: Hematology & Oncology

## 2020-06-12 ENCOUNTER — Inpatient Hospital Stay: Payer: Medicaid Other | Attending: Hematology & Oncology

## 2020-06-12 VITALS — BP 116/60 | HR 85 | Temp 99.1°F | Resp 16 | Wt 260.0 lb

## 2020-06-12 DIAGNOSIS — Z79899 Other long term (current) drug therapy: Secondary | ICD-10-CM | POA: Diagnosis not present

## 2020-06-12 DIAGNOSIS — I6529 Occlusion and stenosis of unspecified carotid artery: Secondary | ICD-10-CM | POA: Diagnosis not present

## 2020-06-12 DIAGNOSIS — C9210 Chronic myeloid leukemia, BCR/ABL-positive, not having achieved remission: Secondary | ICD-10-CM | POA: Diagnosis present

## 2020-06-12 DIAGNOSIS — D509 Iron deficiency anemia, unspecified: Secondary | ICD-10-CM | POA: Insufficient documentation

## 2020-06-12 DIAGNOSIS — E1165 Type 2 diabetes mellitus with hyperglycemia: Secondary | ICD-10-CM

## 2020-06-12 LAB — CMP (CANCER CENTER ONLY)
ALT: 8 U/L (ref 0–44)
AST: 8 U/L — ABNORMAL LOW (ref 15–41)
Albumin: 3.9 g/dL (ref 3.5–5.0)
Alkaline Phosphatase: 97 U/L (ref 38–126)
Anion gap: 8 (ref 5–15)
BUN: 17 mg/dL (ref 6–20)
CO2: 35 mmol/L — ABNORMAL HIGH (ref 22–32)
Calcium: 9 mg/dL (ref 8.9–10.3)
Chloride: 99 mmol/L (ref 98–111)
Creatinine: 1.11 mg/dL — ABNORMAL HIGH (ref 0.44–1.00)
GFR, Estimated: >60 mL/min (ref >60)
Glucose, Bld: 70 mg/dL (ref 70–99)
Potassium: 3.3 mmol/L — ABNORMAL LOW (ref 3.5–5.1)
Sodium: 142 mmol/L (ref 135–145)
Total Bilirubin: 0.6 mg/dL (ref 0.3–1.2)
Total Protein: 6.6 g/dL (ref 6.5–8.1)

## 2020-06-12 LAB — CBC WITH DIFFERENTIAL (CANCER CENTER ONLY)
Abs Immature Granulocytes: 0.07 10*3/uL (ref 0.00–0.07)
Basophils Absolute: 0.1 10*3/uL (ref 0.0–0.1)
Basophils Relative: 1 %
Eosinophils Absolute: 0 10*3/uL (ref 0.0–0.5)
Eosinophils Relative: 0 %
HCT: 34.7 % — ABNORMAL LOW (ref 36.0–46.0)
Hemoglobin: 11 g/dL — ABNORMAL LOW (ref 12.0–15.0)
Immature Granulocytes: 1 %
Lymphocytes Relative: 15 %
Lymphs Abs: 1.7 10*3/uL (ref 0.7–4.0)
MCH: 27.8 pg (ref 26.0–34.0)
MCHC: 31.7 g/dL (ref 30.0–36.0)
MCV: 87.6 fL (ref 80.0–100.0)
Monocytes Absolute: 0.2 10*3/uL (ref 0.1–1.0)
Monocytes Relative: 1 %
Neutro Abs: 9.1 10*3/uL — ABNORMAL HIGH (ref 1.7–7.7)
Neutrophils Relative %: 82 %
Platelet Count: 284 10*3/uL (ref 150–400)
RBC: 3.96 MIL/uL (ref 3.87–5.11)
RDW: 18 % — ABNORMAL HIGH (ref 11.5–15.5)
WBC Count: 11.1 10*3/uL — ABNORMAL HIGH (ref 4.0–10.5)
nRBC: 0 % (ref 0.0–0.2)

## 2020-06-12 LAB — SAVE SMEAR(SSMR), FOR PROVIDER SLIDE REVIEW

## 2020-06-12 LAB — LACTATE DEHYDROGENASE: LDH: 193 U/L — ABNORMAL HIGH (ref 98–192)

## 2020-06-12 NOTE — Progress Notes (Signed)
Hematology and Oncology Follow Up Visit  Tabitha Estrada [id number] Dec 29, 1970 50 y.o. 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  She seems to be doing pretty well.  She is on baby aspirin and Plavix.  She did have the CVA.  She seems to be through this.  Not sure what can happen with this carotid thrombus.  I will know she has to see vascular surgery for this.  She is doing well on the Scemblix.  She has had no problems with diarrhea.  She is losing some of her hair.  I suppose this might be from the Scemblix.  Her blood sugars are doing incredibly well.  She is on insulin.  Her glucose today is 70.  She has had no problems with nausea or vomiting.  There is little bit of a cough.  She has allergy issues.  She has underlying lung problems.  She has had no bleeding.  Is been no rashes.  Overall, I would say her performance status is ECOG 1.    Medications:  Allergies as of 06/12/2020      Reactions   Dulaglutide Nausea And Vomiting, Other (See Comments)   Elemental Sulfur Nausea And Vomiting   Liraglutide Nausea And Vomiting   Metformin Diarrhea   Sulfamethoxazole Nausea And Vomiting      Medication List       Accurate as of June 12, 2020  3:41 PM. If you have any questions, ask your nurse or doctor.        albuterol 108 (90 Base) MCG/ACT inhaler Commonly known as: VENTOLIN HFA Inhale 1-2 puffs into the lungs every 4 (four) hours as needed for wheezing or shortness of breath.    albuterol (2.5 MG/3ML) 0.083% nebulizer solution Commonly known as: PROVENTIL Inhale the contents of one vial in nebulizer every four to six hours as needed for cough or wheeze.   aluminum-magnesium hydroxide-simethicone 371-696-78 MG/5ML Susp Commonly known as: MAALOX Take by mouth daily as needed.   Aspirin Low Dose 81 MG EC tablet Generic drug: aspirin Take 81 mg by mouth daily.   atorvastatin 80 MG tablet Commonly known as: LIPITOR TAKE 1 TABLET (80 MG TOTAL) BY MOUTH DAILY. NEEDS LAB WORK   budesonide-formoterol 160-4.5 MCG/ACT inhaler Commonly known as: SYMBICORT Inhale 2 puffs into the lungs 2 (two) times daily.   clobetasol ointment 0.05 % Commonly known as: TEMOVATE Apply 1 application topically 2 (two) times daily.   clopidogrel 75 MG tablet Commonly known as: PLAVIX Take 75 mg by mouth daily.   colesevelam 625 MG tablet Commonly known as: WELCHOL Take 1,875 mg by mouth 2 (two) times daily.   cyclobenzaprine 10 MG tablet Commonly known as: FLEXERIL Take 10 mg by mouth daily as needed.   Dexcom G6 Sensor Misc Apply topically.   dextromethorphan 30 MG/5ML liquid Commonly known as: DELSYM Take by mouth.   diclofenac Sodium 1 % Gel Commonly known as: VOLTAREN SMARTSIG:Gram(s) Topical Twice Daily   diphenoxylate-atropine 2.5-0.025 MG tablet Commonly known as: LOMOTIL  Take 1 tablet by mouth 4 (four) times daily as needed for diarrhea or loose stools (as needed after taking Sprycel for diarrhea).   fluticasone 50 MCG/ACT nasal spray Commonly known as: FLONASE Use two sprays in each nostril once daily as directed.   hyoscyamine 0.125 MG Tbdp disintergrating tablet Commonly known as: ANASPAZ Take by mouth.   insulin aspart 100 UNIT/ML FlexPen Commonly known as: NovoLOG FlexPen Inject 40 Units into the skin daily with supper. What changed:   how much to take  additional instructions   insulin glargine (1 Unit Dial) 300 UNIT/ML Solostar  Pen Commonly known as: TOUJEO Inject 70 Units into the skin daily.   ketoconazole 2 % cream Commonly known as: NIZORAL Apply topically 2 (two) times a day.   Magnesium Chloride 64 MG Tbec Take 64 mg by mouth daily.   meclizine 25 MG tablet Commonly known as: ANTIVERT Take 1 tablet (25 mg total) by mouth 3 (three) times daily as needed for dizziness.   meloxicam 7.5 MG tablet Commonly known as: MOBIC Take 7.5 mg by mouth daily.   montelukast 10 MG tablet Commonly known as: SINGULAIR TAKE 1 TABLET (10 MG TOTAL) BY MOUTH DAILY   nicotine 21 mg/24hr patch Commonly known as: NICODERM CQ - dosed in mg/24 hours 21 mg daily.   omeprazole 40 MG capsule Commonly known as: PRILOSEC Take 40 mg by mouth daily.   pregabalin 200 MG capsule Commonly known as: LYRICA TAKE 1 CAPSULE BY MOUTH 2 TIMES DAILY.   prochlorperazine 10 MG tablet Commonly known as: COMPAZINE TAKE 1 TABLET BY MOUTH EVERY 6 HOURS AS NEEDED FOR NAUSEA AND VOMITING   Scemblix 40 MG Tabs Generic drug: Asciminib HCl TAKE 1 TAB BY MOUTH 2 (TWO) TIMES DAILY.   tiZANidine 4 MG tablet Commonly known as: ZANAFLEX Take by mouth daily as needed.   torsemide 20 MG tablet Commonly known as: DEMADEX Take 1 tablet by mouth daily.   valACYclovir 500 MG tablet Commonly known as: VALTREX TAKE ONE TABLET BY MOUTH AT BEDTIME   valsartan 320 MG tablet Commonly known as: DIOVAN Take by mouth daily.       Allergies:  Allergies  Allergen Reactions  . Dulaglutide Nausea And Vomiting and Other (See Comments)  . Elemental Sulfur Nausea And Vomiting  . Liraglutide Nausea And Vomiting  . Metformin Diarrhea  . Sulfamethoxazole Nausea And Vomiting    Past Medical History, Surgical history, Social history, and Family History were reviewed and updated.  Review of Systems: Review of Systems  Constitutional: Negative.   Eyes: Negative.   Respiratory: Positive for cough.   Cardiovascular: Positive for palpitations.   Gastrointestinal: Positive for diarrhea.  Genitourinary: Negative.   Musculoskeletal: Positive for back pain and neck pain.  Skin: Positive for rash.  Neurological: Positive for headaches.  Endo/Heme/Allergies: Negative.   Psychiatric/Behavioral: Negative.      Physical Exam:  weight is 260 lb (117.9 kg). Her oral temperature is 99.1 F (37.3 C). Her blood pressure is 116/60 and her pulse is 85. Her respiration is 16 and oxygen saturation is 97%.   Wt Readings from Last 3 Encounters:  06/12/20 260 lb (117.9 kg)  05/01/20 264 lb (119.7 kg)  04/09/20 270 lb (122.5 kg)  Her vital signs are temperature of 97.9.  Pulse 81.  Blood pressure 118/58.  Weight is 269 pounds.  Physical Exam Vitals reviewed.  HENT:     Head: Normocephalic and atraumatic.  Eyes:     Pupils: Pupils are equal,  round, and reactive to light.  Cardiovascular:     Rate and Rhythm: Normal rate and regular rhythm.     Heart sounds: Normal heart sounds.  Pulmonary:     Effort: Pulmonary effort is normal.     Breath sounds: Normal breath sounds.  Abdominal:     General: Bowel sounds are normal.     Palpations: Abdomen is soft.  Musculoskeletal:        General: No tenderness or deformity. Normal range of motion.     Cervical back: Normal range of motion.  Lymphadenopathy:     Cervical: No cervical adenopathy.  Skin:    General: Skin is warm and dry.     Findings: No erythema or rash.  Neurological:     Mental Status: She is alert and oriented to person, place, and time.  Psychiatric:        Behavior: Behavior normal.        Thought Content: Thought content normal.        Judgment: Judgment normal.      Lab Results  Component Value Date   WBC 11.1 (H) 06/12/2020   HGB 11.0 (L) 06/12/2020   HCT 34.7 (L) 06/12/2020   MCV 87.6 06/12/2020   PLT 284 06/12/2020   Lab Results  Component Value Date   FERRITIN 185 05/01/2020   IRON 38 (L) 05/01/2020   TIBC 261 05/01/2020   UIBC 223 05/01/2020    IRONPCTSAT 15 (L) 05/01/2020   Lab Results  Component Value Date   RETICCTPCT 1.4 05/01/2019   RBC 3.96 06/12/2020   No results found for: KPAFRELGTCHN, LAMBDASER, KAPLAMBRATIO Lab Results  Component Value Date   IGGSERUM 675 (L) 11/10/2016   IGMSERUM 86 11/10/2016   No results found for: Odetta Pink, SPEI   Chemistry      Component Value Date/Time   NA 142 06/12/2020 1450   NA 144 01/27/2017 1302   NA 138 09/16/2016 1105   K 3.3 (L) 06/12/2020 1450   K 4.3 01/27/2017 1302   K 4.2 09/16/2016 1105   CL 99 06/12/2020 1450   CL 102 01/27/2017 1302   CO2 35 (H) 06/12/2020 1450   CO2 27 01/27/2017 1302   CO2 26 09/16/2016 1105   BUN 17 06/12/2020 1450   BUN 14 01/27/2017 1302   BUN 11.9 09/16/2016 1105   CREATININE 1.11 (H) 06/12/2020 1450   CREATININE 1.1 01/27/2017 1302   CREATININE 0.8 09/16/2016 1105      Component Value Date/Time   CALCIUM 9.0 06/12/2020 1450   CALCIUM 9.5 01/27/2017 1302   CALCIUM 9.0 09/16/2016 1105   ALKPHOS 97 06/12/2020 1450   ALKPHOS 116 (H) 01/27/2017 1302   ALKPHOS 136 09/16/2016 1105   AST 8 (L) 06/12/2020 1450   AST 8 09/16/2016 1105   ALT 8 06/12/2020 1450   ALT 25 01/27/2017 1302   ALT 12 09/16/2016 1105   BILITOT 0.6 06/12/2020 1450   BILITOT 0.32 09/16/2016 1105       Impression and Plan: Tabitha Estrada is a very pleasant 50 yo with chronic phase CML.  We had a really hard time trying to treat this.  We made a switch over to Scemblix.  Hopefully, she will continue to tolerate this.  Hopefully it will work.  We will have to see with the BCR/ABL is.  Of note, I did send off a mutation analysis.  There is no T315I mutation.  As such, we do not  need high dose of Scemblix.  I suppose of the issue now is whether or not she is going need surgery for the carotid thrombus.  I am just happy that she looks good.  I am glad that she seems to be tolerating the Scemblix without the problems  that she had with the TKI agents that we tried on her.  I will plan to still follow her up in 6 weeks.      [name], MD 4/14/20223:41 PM

## 2020-06-17 ENCOUNTER — Other Ambulatory Visit: Payer: Self-pay | Admitting: *Deleted

## 2020-06-17 ENCOUNTER — Telehealth: Payer: Self-pay

## 2020-06-17 DIAGNOSIS — C9210 Chronic myeloid leukemia, BCR/ABL-positive, not having achieved remission: Secondary | ICD-10-CM

## 2020-06-17 NOTE — Telephone Encounter (Signed)
Called pt per sch message and appts made her 06/12/20 los   Tabitha Estrada

## 2020-06-17 NOTE — Telephone Encounter (Signed)
Called pt per sch message and she is aware of her lab only appt and now her 06/2020 appt as well   Tabitha Estrada

## 2020-06-18 LAB — BCR/ABL

## 2020-06-19 ENCOUNTER — Other Ambulatory Visit: Payer: Self-pay

## 2020-06-19 ENCOUNTER — Encounter: Payer: Self-pay | Admitting: Pharmacist

## 2020-06-19 ENCOUNTER — Inpatient Hospital Stay: Payer: Medicaid Other

## 2020-06-19 ENCOUNTER — Other Ambulatory Visit (HOSPITAL_COMMUNITY): Payer: Self-pay

## 2020-06-19 ENCOUNTER — Other Ambulatory Visit: Payer: Self-pay | Admitting: Pharmacist

## 2020-06-19 DIAGNOSIS — C9210 Chronic myeloid leukemia, BCR/ABL-positive, not having achieved remission: Secondary | ICD-10-CM

## 2020-06-19 MED ORDER — SCEMBLIX 40 MG PO TABS
40.0000 mg | ORAL_TABLET | Freq: Every day | ORAL | 2 refills | Status: DC
  Filled 2020-06-19: qty 60, 60d supply, fill #0

## 2020-06-19 NOTE — Progress Notes (Signed)
Oral Chemotherapy Pharmacist Encounter   Provider changed patient to Scemblix 40mg  daily dosing. Prescription with updated dosing instructions sent to [place].   Darl Pikes, PharmD, BCPS, BCOP, CPP Hematology/Oncology Clinical Pharmacist [facility] [phone]  06/19/2020 3:58 PM

## 2020-06-20 ENCOUNTER — Other Ambulatory Visit (HOSPITAL_COMMUNITY): Payer: Self-pay

## 2020-06-20 MED ORDER — SCEMBLIX 20 MG PO TABS
40.0000 mg | ORAL_TABLET | Freq: Every day | ORAL | 4 refills | Status: DC
  Filled 2020-06-20: qty 60, 30d supply, fill #0

## 2020-06-20 NOTE — Addendum Note (Signed)
Addended by: Darl Pikes on: 06/20/2020 01:29 PM   Modules accepted: Orders

## 2020-06-23 ENCOUNTER — Telehealth: Payer: Self-pay

## 2020-06-23 ENCOUNTER — Other Ambulatory Visit (HOSPITAL_COMMUNITY): Payer: Self-pay

## 2020-06-23 NOTE — Telephone Encounter (Signed)
Pt called in to r/s her 5/26 appt to the first week of June   Tabitha Estrada

## 2020-06-25 ENCOUNTER — Encounter: Payer: Self-pay | Admitting: *Deleted

## 2020-06-25 ENCOUNTER — Other Ambulatory Visit (HOSPITAL_COMMUNITY): Payer: Self-pay

## 2020-06-25 LAB — BCR/ABL

## 2020-07-16 ENCOUNTER — Other Ambulatory Visit (HOSPITAL_COMMUNITY): Payer: Self-pay

## 2020-07-22 ENCOUNTER — Other Ambulatory Visit (HOSPITAL_COMMUNITY): Payer: Self-pay

## 2020-07-22 ENCOUNTER — Other Ambulatory Visit: Payer: Self-pay | Admitting: Hematology & Oncology

## 2020-07-22 DIAGNOSIS — C9210 Chronic myeloid leukemia, BCR/ABL-positive, not having achieved remission: Secondary | ICD-10-CM

## 2020-07-22 MED ORDER — SCEMBLIX 20 MG PO TABS
40.0000 mg | ORAL_TABLET | Freq: Every day | ORAL | 4 refills | Status: DC
  Filled 2020-07-22 – 2020-08-13 (×2): qty 60, 30d supply, fill #0
  Filled 2020-09-12: qty 60, 30d supply, fill #1

## 2020-07-24 ENCOUNTER — Other Ambulatory Visit: Payer: Medicaid Other

## 2020-07-24 ENCOUNTER — Ambulatory Visit: Payer: Medicaid Other | Admitting: Hematology & Oncology

## 2020-08-07 ENCOUNTER — Telehealth: Payer: Self-pay

## 2020-08-07 ENCOUNTER — Encounter: Payer: Self-pay | Admitting: Hematology & Oncology

## 2020-08-07 ENCOUNTER — Inpatient Hospital Stay: Payer: Medicaid Other

## 2020-08-07 ENCOUNTER — Inpatient Hospital Stay: Payer: Medicaid Other | Admitting: Hematology & Oncology

## 2020-08-07 NOTE — Telephone Encounter (Signed)
Pt called in stating that she had a bad uti and will be going to her pcp and does not wish to travel to our office today, she req fridays and or req to see sarah whom has no avail spots  Lucile Hillmann

## 2020-08-13 ENCOUNTER — Other Ambulatory Visit (HOSPITAL_COMMUNITY): Payer: Self-pay

## 2020-08-14 ENCOUNTER — Other Ambulatory Visit (HOSPITAL_COMMUNITY): Payer: Self-pay

## 2020-08-15 ENCOUNTER — Other Ambulatory Visit (HOSPITAL_COMMUNITY): Payer: Self-pay

## 2020-08-29 ENCOUNTER — Other Ambulatory Visit: Payer: Self-pay

## 2020-08-29 ENCOUNTER — Inpatient Hospital Stay: Payer: Medicaid Other | Attending: Hematology & Oncology

## 2020-08-29 ENCOUNTER — Inpatient Hospital Stay: Payer: Medicaid Other

## 2020-08-29 ENCOUNTER — Encounter: Payer: Self-pay | Admitting: Hematology & Oncology

## 2020-08-29 ENCOUNTER — Telehealth: Payer: Self-pay | Admitting: *Deleted

## 2020-08-29 ENCOUNTER — Inpatient Hospital Stay (HOSPITAL_BASED_OUTPATIENT_CLINIC_OR_DEPARTMENT_OTHER): Payer: Medicaid Other | Admitting: Hematology & Oncology

## 2020-08-29 VITALS — BP 119/61 | HR 71 | Temp 98.1°F | Resp 19 | Wt 258.0 lb

## 2020-08-29 DIAGNOSIS — R059 Cough, unspecified: Secondary | ICD-10-CM | POA: Diagnosis not present

## 2020-08-29 DIAGNOSIS — R053 Chronic cough: Secondary | ICD-10-CM

## 2020-08-29 DIAGNOSIS — C9210 Chronic myeloid leukemia, BCR/ABL-positive, not having achieved remission: Secondary | ICD-10-CM

## 2020-08-29 DIAGNOSIS — L659 Nonscarring hair loss, unspecified: Secondary | ICD-10-CM | POA: Diagnosis not present

## 2020-08-29 DIAGNOSIS — E1165 Type 2 diabetes mellitus with hyperglycemia: Secondary | ICD-10-CM

## 2020-08-29 LAB — CMP (CANCER CENTER ONLY)
ALT: 9 U/L (ref 0–44)
AST: 6 U/L — ABNORMAL LOW (ref 15–41)
Albumin: 3.8 g/dL (ref 3.5–5.0)
Alkaline Phosphatase: 114 U/L (ref 38–126)
Anion gap: 9 (ref 5–15)
BUN: 19 mg/dL (ref 6–20)
CO2: 36 mmol/L — ABNORMAL HIGH (ref 22–32)
Calcium: 8.7 mg/dL — ABNORMAL LOW (ref 8.9–10.3)
Chloride: 93 mmol/L — ABNORMAL LOW (ref 98–111)
Creatinine: 1.19 mg/dL — ABNORMAL HIGH (ref 0.44–1.00)
GFR, Estimated: 56 mL/min — ABNORMAL LOW (ref >60)
Glucose, Bld: 409 mg/dL — ABNORMAL HIGH (ref 70–99)
Potassium: 3.6 mmol/L (ref 3.5–5.1)
Sodium: 138 mmol/L (ref 135–145)
Total Bilirubin: 0.5 mg/dL (ref 0.3–1.2)
Total Protein: 6.5 g/dL (ref 6.5–8.1)

## 2020-08-29 LAB — CBC WITH DIFFERENTIAL (CANCER CENTER ONLY)
Abs Immature Granulocytes: 0.03 10*3/uL (ref 0.00–0.07)
Basophils Absolute: 0 10*3/uL (ref 0.0–0.1)
Basophils Relative: 0 %
Eosinophils Absolute: 0 10*3/uL (ref 0.0–0.5)
Eosinophils Relative: 0 %
HCT: 37.4 % (ref 36.0–46.0)
Hemoglobin: 11.9 g/dL — ABNORMAL LOW (ref 12.0–15.0)
Immature Granulocytes: 0 %
Lymphocytes Relative: 19 %
Lymphs Abs: 1.4 10*3/uL (ref 0.7–4.0)
MCH: 27.1 pg (ref 26.0–34.0)
MCHC: 31.8 g/dL (ref 30.0–36.0)
MCV: 85.2 fL (ref 80.0–100.0)
Monocytes Absolute: 0.3 10*3/uL (ref 0.1–1.0)
Monocytes Relative: 3 %
Neutro Abs: 5.8 10*3/uL (ref 1.7–7.7)
Neutrophils Relative %: 78 %
Platelet Count: 134 10*3/uL — ABNORMAL LOW (ref 150–400)
RBC: 4.39 MIL/uL (ref 3.87–5.11)
RDW: 16.3 % — ABNORMAL HIGH (ref 11.5–15.5)
WBC Count: 7.5 10*3/uL (ref 4.0–10.5)
nRBC: 0 % (ref 0.0–0.2)

## 2020-08-29 LAB — SAVE SMEAR(SSMR), FOR PROVIDER SLIDE REVIEW

## 2020-08-29 LAB — RETICULOCYTES
Immature Retic Fract: 16.7 % — ABNORMAL HIGH (ref 2.3–15.9)
RBC.: 4.41 MIL/uL (ref 3.87–5.11)
Retic Count, Absolute: 64.4 10*3/uL (ref 19.0–186.0)
Retic Ct Pct: 1.5 % (ref 0.4–3.1)

## 2020-08-29 LAB — IRON AND TIBC
Iron: 47 ug/dL (ref 41–142)
Saturation Ratios: 18 % — ABNORMAL LOW (ref 21–57)
TIBC: 268 ug/dL (ref 236–444)
UIBC: 221 ug/dL (ref 120–384)

## 2020-08-29 LAB — TSH: TSH: 4.636 u[IU]/mL — ABNORMAL HIGH (ref 0.308–3.960)

## 2020-08-29 LAB — FERRITIN: Ferritin: 123 ng/mL (ref 11–307)

## 2020-08-29 LAB — LACTATE DEHYDROGENASE: LDH: 209 U/L — ABNORMAL HIGH (ref 98–192)

## 2020-08-29 NOTE — Telephone Encounter (Signed)
Per 08/29/20 los - gave patient upcoming appointments - patient confirmed - view mychart 

## 2020-08-29 NOTE — Progress Notes (Signed)
Hematology and Oncology Follow Up Visit  Tabitha Estrada [id number] [birth year] 50 y.o. 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  She is incredibly tanned.  She went to the beach right before [facility] Day.  She has maintained her 10 since then.  She has some bruising.  This might be from aspirin and Plavix that she is taking.  She is having some hair loss.  I suppose this could be from the Scemblix.  I will have to check to see whether the instances of hair loss with Scemblix.  Her last BCR/ABL back in April was 71%.  I would like to think that it would be a lot lower this time.  She has had no problems with nausea or vomiting.  She had a little bit of diarrhea.  This does seem to be fairly well controlled.  She has had no fever.  She has had no bleeding.  There is been no urinary issues.  Overall, she has had no problems with respect to cerebrovascular issues.  She does not see the vascular doctor for another few months.  She has had this cough.  It is productive.  She says that the cough is productive of a yellow sputum.  She has had this for about a month.  We will have to get a chest x-ray on her.  Overall, her performance status is probably ECOG 1.    Medications:  Allergies as of 08/29/2020       Reactions   Dulaglutide Nausea And Vomiting, Other (See Comments)   Elemental Sulfur Nausea And Vomiting   Liraglutide Nausea And Vomiting   Metformin Diarrhea   Sulfamethoxazole Nausea And  Vomiting        Medication List        Accurate as of August 29, 2020  8:09 AM. If you have any questions, ask your nurse or doctor.          albuterol 108 (90 Base) MCG/ACT inhaler Commonly known as: VENTOLIN HFA Inhale 1-2 puffs into the lungs every 4 (four) hours as needed for wheezing or shortness of breath.   albuterol (2.5 MG/3ML) 0.083% nebulizer solution Commonly known as: PROVENTIL Inhale the contents of one vial in nebulizer every four to six hours as needed for cough or wheeze.   aluminum-magnesium hydroxide-simethicone 923-300-76 MG/5ML Susp Commonly known as: MAALOX Take by mouth daily as needed.   Aspirin Low Dose 81 MG EC tablet Generic drug: aspirin Take 81 mg by mouth daily.   atorvastatin 80 MG tablet Commonly known as: LIPITOR TAKE 1 TABLET (80 MG TOTAL) BY MOUTH DAILY. NEEDS LAB WORK   budesonide-formoterol 160-4.5 MCG/ACT inhaler Commonly known as: SYMBICORT Inhale 2 puffs into the lungs 2 (two) times daily.   clobetasol ointment 0.05 % Commonly known as: TEMOVATE Apply 1 application topically 2 (two) times daily.   clopidogrel 75 MG tablet Commonly known as: PLAVIX  Take 75 mg by mouth daily.   colesevelam 625 MG tablet Commonly known as: WELCHOL Take 1,875 mg by mouth 2 (two) times daily.   cyclobenzaprine 10 MG tablet Commonly known as: FLEXERIL Take 10 mg by mouth daily as needed.   Dexcom G6 Sensor Misc Apply topically.   dextromethorphan 30 MG/5ML liquid Commonly known as: DELSYM Take by mouth.   diclofenac Sodium 1 % Gel Commonly known as: VOLTAREN SMARTSIG:Gram(s) Topical Twice Daily   diphenoxylate-atropine 2.5-0.025 MG tablet Commonly known as: LOMOTIL Take 1 tablet by mouth 4 (four) times daily as needed for diarrhea or loose stools (as needed after taking Sprycel for diarrhea).   fluticasone 50 MCG/ACT nasal spray Commonly known as: FLONASE Use two sprays in each nostril once daily as directed.   hyoscyamine 0.125  MG Tbdp disintergrating tablet Commonly known as: ANASPAZ Take by mouth.   insulin aspart 100 UNIT/ML FlexPen Commonly known as: NovoLOG FlexPen Inject 40 Units into the skin daily with supper. What changed:  how much to take additional instructions   insulin glargine (1 Unit Dial) 300 UNIT/ML Solostar Pen Commonly known as: TOUJEO Inject 70 Units into the skin daily.   ketoconazole 2 % cream Commonly known as: NIZORAL Apply topically 2 (two) times a day.   Magnesium Chloride 64 MG Tbec Take 64 mg by mouth daily.   meclizine 25 MG tablet Commonly known as: ANTIVERT Take 1 tablet (25 mg total) by mouth 3 (three) times daily as needed for dizziness.   meloxicam 7.5 MG tablet Commonly known as: MOBIC Take 7.5 mg by mouth daily.   montelukast 10 MG tablet Commonly known as: SINGULAIR TAKE 1 TABLET (10 MG TOTAL) BY MOUTH DAILY   nicotine 21 mg/24hr patch Commonly known as: NICODERM CQ - dosed in mg/24 hours 21 mg daily.   omeprazole 40 MG capsule Commonly known as: PRILOSEC Take 40 mg by mouth daily.   pregabalin 200 MG capsule Commonly known as: LYRICA TAKE 1 CAPSULE BY MOUTH 2 TIMES DAILY.   prochlorperazine 10 MG tablet Commonly known as: COMPAZINE TAKE 1 TABLET BY MOUTH EVERY 6 HOURS AS NEEDED FOR NAUSEA AND VOMITING   Scemblix 20 MG Tabs Generic drug: Asciminib HCl Take 2 tabs (40 mg) by mouth daily.   tiZANidine 4 MG tablet Commonly known as: ZANAFLEX Take by mouth daily as needed.   torsemide 20 MG tablet Commonly known as: DEMADEX Take 1 tablet by mouth daily.   valACYclovir 500 MG tablet Commonly known as: VALTREX TAKE ONE TABLET BY MOUTH AT BEDTIME   valsartan 320 MG tablet Commonly known as: DIOVAN Take by mouth daily.        Allergies:  Allergies  Allergen Reactions   Dulaglutide Nausea And Vomiting and Other (See Comments)   Elemental Sulfur Nausea And Vomiting   Liraglutide Nausea And Vomiting   Metformin Diarrhea    Sulfamethoxazole Nausea And Vomiting    Past Medical History, Surgical history, Social history, and Family History were reviewed and updated.  Review of Systems: Review of Systems  Constitutional: Negative.   Eyes: Negative.   Respiratory:  Positive for cough.   Cardiovascular:  Positive for palpitations.  Gastrointestinal:  Positive for diarrhea.  Genitourinary: Negative.   Musculoskeletal:  Positive for back pain and neck pain.  Skin:  Positive for rash.  Neurological:  Positive for headaches.  Endo/Heme/Allergies: Negative.   Psychiatric/Behavioral: Negative.      Physical Exam:  vitals were not taken for this visit.   Wt Readings  from Last 3 Encounters:  06/12/20 260 lb (117.9 kg)  05/01/20 264 lb (119.7 kg)  04/09/20 270 lb (122.5 kg)  Her vital signs are temperature of 97.9.  Pulse 81.  Blood pressure 118/58.  Weight is 269 pounds.  Physical Exam Vitals reviewed.  HENT:     Head: Normocephalic and atraumatic.  Eyes:     Pupils: Pupils are equal, round, and reactive to light.  Cardiovascular:     Rate and Rhythm: Normal rate and regular rhythm.     Heart sounds: Normal heart sounds.  Pulmonary:     Effort: Pulmonary effort is normal.     Breath sounds: Normal breath sounds.  Abdominal:     General: Bowel sounds are normal.     Palpations: Abdomen is soft.  Musculoskeletal:        General: No tenderness or deformity. Normal range of motion.     Cervical back: Normal range of motion.  Lymphadenopathy:     Cervical: No cervical adenopathy.  Skin:    General: Skin is warm and dry.     Findings: No erythema or rash.  Neurological:     Mental Status: She is alert and oriented to person, place, and time.  Psychiatric:        Behavior: Behavior normal.        Thought Content: Thought content normal.        Judgment: Judgment normal.     Lab Results  Component Value Date   WBC 7.5 08/29/2020   HGB 11.9 (L) 08/29/2020   HCT 37.4 08/29/2020   MCV 85.2  08/29/2020   PLT 134 (L) 08/29/2020   Lab Results  Component Value Date   FERRITIN 185 05/01/2020   IRON 38 (L) 05/01/2020   TIBC 261 05/01/2020   UIBC 223 05/01/2020   IRONPCTSAT 15 (L) 05/01/2020   Lab Results  Component Value Date   RETICCTPCT 1.4 05/01/2019   RBC 4.39 08/29/2020   No results found for: KPAFRELGTCHN, LAMBDASER, KAPLAMBRATIO Lab Results  Component Value Date   IGGSERUM 675 (L) 11/10/2016   IGMSERUM 86 11/10/2016   No results found for: Odetta Pink, SPEI   Chemistry      Component Value Date/Time   NA 142 06/12/2020 1450   NA 144 01/27/2017 1302   NA 138 09/16/2016 1105   K 3.3 (L) 06/12/2020 1450   K 4.3 01/27/2017 1302   K 4.2 09/16/2016 1105   CL 99 06/12/2020 1450   CL 102 01/27/2017 1302   CO2 35 (H) 06/12/2020 1450   CO2 27 01/27/2017 1302   CO2 26 09/16/2016 1105   BUN 17 06/12/2020 1450   BUN 14 01/27/2017 1302   BUN 11.9 09/16/2016 1105   CREATININE 1.11 (H) 06/12/2020 1450   CREATININE 1.1 01/27/2017 1302   CREATININE 0.8 09/16/2016 1105      Component Value Date/Time   CALCIUM 9.0 06/12/2020 1450   CALCIUM 9.5 01/27/2017 1302   CALCIUM 9.0 09/16/2016 1105   ALKPHOS 97 06/12/2020 1450   ALKPHOS 116 (H) 01/27/2017 1302   ALKPHOS 136 09/16/2016 1105   AST 8 (L) 06/12/2020 1450   AST 8 09/16/2016 1105   ALT 8 06/12/2020 1450   ALT 25 01/27/2017 1302   ALT 12 09/16/2016 1105   BILITOT 0.6 06/12/2020 1450   BILITOT 0.32 09/16/2016 1105       Impression and Plan: Ms. Heinrich is a very pleasant 50 yo with chronic phase  CML.  We had a really hard time trying to treat this.  We made a switch over to Scemblix.  Hopefully, she will continue to tolerate this.  Hopefully it will work.  We will have to see with the BCR/ABL is.  We will have to see what the chest x-ray shows.  I would not put her on any antibiotics until we see what the x-ray looks like.  Hopefully, we will see a nice  drop in the BCR/ABL.  I just feel bad about the hair loss.  Again I am not sure what we can do if this is from the Scemblix.  This is Medicine should be able to tolerate so far.  I would like to see her back after Labor Day.   [name], MD 7/1/20228:09 AM

## 2020-09-03 ENCOUNTER — Telehealth: Payer: Self-pay

## 2020-09-03 ENCOUNTER — Telehealth: Payer: Self-pay | Admitting: *Deleted

## 2020-09-03 NOTE — Telephone Encounter (Signed)
LMTCB. -MM 

## 2020-09-03 NOTE — Telephone Encounter (Signed)
Called patient to schedule (1) dose of IV Iron - patient confirmed.

## 2020-09-03 NOTE — Telephone Encounter (Signed)
Called and informed patient of lab results, patient verbalized understanding and denies any questions or concerns at this time.  Message sent to scheduling for apt.

## 2020-09-03 NOTE — Telephone Encounter (Signed)
-----   Message from [name], MD sent at 09/02/2020  3:11 PM EDT ----- Call - the iron level is low.  We can give her some IV iron for this.  The thyroid is slightly low -- I would not give any Synthroid for this.  Tabitha Estrada

## 2020-09-03 NOTE — Telephone Encounter (Signed)
Per Dr E- pt needs 3 doses of iron. [facility]  Message sent to scheduling per [facility].

## 2020-09-04 ENCOUNTER — Other Ambulatory Visit: Payer: Self-pay | Admitting: *Deleted

## 2020-09-04 DIAGNOSIS — C9210 Chronic myeloid leukemia, BCR/ABL-positive, not having achieved remission: Secondary | ICD-10-CM

## 2020-09-05 ENCOUNTER — Ambulatory Visit: Payer: Medicaid Other

## 2020-09-08 ENCOUNTER — Other Ambulatory Visit (HOSPITAL_COMMUNITY): Payer: Self-pay

## 2020-09-11 ENCOUNTER — Other Ambulatory Visit (HOSPITAL_COMMUNITY): Payer: Self-pay

## 2020-09-12 ENCOUNTER — Other Ambulatory Visit (HOSPITAL_COMMUNITY): Payer: Self-pay

## 2020-09-12 ENCOUNTER — Ambulatory Visit: Payer: Medicaid Other

## 2020-09-16 ENCOUNTER — Encounter: Payer: Self-pay | Admitting: Hematology & Oncology

## 2020-09-17 ENCOUNTER — Other Ambulatory Visit (HOSPITAL_COMMUNITY): Payer: Self-pay

## 2020-09-19 ENCOUNTER — Ambulatory Visit: Payer: Medicaid Other

## 2020-09-30 ENCOUNTER — Inpatient Hospital Stay: Payer: Medicaid Other | Attending: Hematology & Oncology

## 2020-09-30 ENCOUNTER — Other Ambulatory Visit: Payer: Self-pay

## 2020-09-30 ENCOUNTER — Inpatient Hospital Stay: Payer: Medicaid Other

## 2020-09-30 VITALS — BP 106/73 | HR 73 | Temp 97.9°F | Resp 18

## 2020-09-30 DIAGNOSIS — C9210 Chronic myeloid leukemia, BCR/ABL-positive, not having achieved remission: Secondary | ICD-10-CM

## 2020-09-30 DIAGNOSIS — D508 Other iron deficiency anemias: Secondary | ICD-10-CM

## 2020-09-30 DIAGNOSIS — D509 Iron deficiency anemia, unspecified: Secondary | ICD-10-CM | POA: Insufficient documentation

## 2020-09-30 DIAGNOSIS — Z79899 Other long term (current) drug therapy: Secondary | ICD-10-CM | POA: Diagnosis not present

## 2020-09-30 MED ORDER — SODIUM CHLORIDE 0.9 % IV SOLN
510.0000 mg | Freq: Once | INTRAVENOUS | Status: AC
  Administered 2020-09-30: 510 mg via INTRAVENOUS
  Filled 2020-09-30: qty 510

## 2020-09-30 MED ORDER — SODIUM CHLORIDE 0.9 % IV SOLN
Freq: Once | INTRAVENOUS | Status: AC
  Filled 2020-09-30: qty 250

## 2020-09-30 NOTE — Patient Instructions (Signed)
Ferumoxytol injection What is this medication? FERUMOXYTOL is an iron complex. Iron is used to make healthy red blood cells, which carry oxygen and nutrients throughout the body. This medicine is used totreat iron deficiency anemia. This medicine may be used for other purposes; ask your health care provider orpharmacist if you have questions. COMMON BRAND NAME(S): Feraheme What should I tell my care team before I take this medication? They need to know if you have any of these conditions: anemia not caused by low iron levels high levels of iron in the blood magnetic resonance imaging (MRI) test scheduled an unusual or allergic reaction to iron, other medicines, foods, dyes, or preservatives pregnant or trying to get pregnant breast-feeding How should I use this medication? This medicine is for injection into a vein. It is given by a health careprofessional in a hospital or clinic setting. Talk to your pediatrician regarding the use of this medicine in children.Special care may be needed. Overdosage: If you think you have taken too much of this medicine contact apoison control center or emergency room at once. NOTE: This medicine is only for you. Do not share this medicine with others. What if I miss a dose? It is important not to miss your dose. Call your doctor or health careprofessional if you are unable to keep an appointment. What may interact with this medication? This medicine may interact with the following medications: other iron products This list may not describe all possible interactions. Give your health care provider a list of all the medicines, herbs, non-prescription drugs, or dietary supplements you use. Also tell them if you smoke, drink alcohol, or use illegaldrugs. Some items may interact with your medicine. What should I watch for while using this medication? Visit your doctor or healthcare professional regularly. Tell your doctor or healthcare professional if your  symptoms do not start to get better or if theyget worse. You may need blood work done while you are taking this medicine. You may need to follow a special diet. Talk to your doctor. Foods that contain iron include: whole grains/cereals, dried fruits, beans, or peas, leafy greenvegetables, and organ meats (liver, kidney). What side effects may I notice from receiving this medication? Side effects that you should report to your doctor or health care professionalas soon as possible: allergic reactions like skin rash, itching or hives, swelling of the face, lips, or tongue breathing problems changes in blood pressure feeling faint or lightheaded, falls fever or chills flushing, sweating, or hot feelings swelling of the ankles or feet Side effects that usually do not require medical attention (report to yourdoctor or health care professional if they continue or are bothersome): diarrhea headache nausea, vomiting stomach pain This list may not describe all possible side effects. Call your doctor for medical advice about side effects. You may report side effects to FDA at1-800-FDA-1088. Where should I keep my medication? This drug is given in a hospital or clinic and will not be stored at home. NOTE: This sheet is a summary. It may not cover all possible information. If you have questions about this medicine, talk to your doctor, pharmacist, orhealth care provider.  2022 Elsevier/Gold Standard (2016-04-05 20:21:10)  

## 2020-10-07 ENCOUNTER — Encounter: Payer: Self-pay | Admitting: *Deleted

## 2020-10-07 ENCOUNTER — Inpatient Hospital Stay: Payer: Medicaid Other

## 2020-10-07 ENCOUNTER — Other Ambulatory Visit: Payer: Self-pay

## 2020-10-07 VITALS — BP 110/79 | HR 82 | Temp 97.6°F | Resp 18

## 2020-10-07 DIAGNOSIS — D509 Iron deficiency anemia, unspecified: Secondary | ICD-10-CM | POA: Diagnosis not present

## 2020-10-07 DIAGNOSIS — D508 Other iron deficiency anemias: Secondary | ICD-10-CM

## 2020-10-07 MED ORDER — SODIUM CHLORIDE 0.9 % IV SOLN
Freq: Once | INTRAVENOUS | Status: AC
  Filled 2020-10-07: qty 250

## 2020-10-07 MED ORDER — SODIUM CHLORIDE 0.9 % IV SOLN
510.0000 mg | Freq: Once | INTRAVENOUS | Status: AC
  Administered 2020-10-07: 510 mg via INTRAVENOUS
  Filled 2020-10-07: qty 510

## 2020-10-07 NOTE — Patient Instructions (Signed)
Ferumoxytol injection What is this medication? FERUMOXYTOL is an iron complex. Iron is used to make healthy red blood cells, which carry oxygen and nutrients throughout the body. This medicine is used totreat iron deficiency anemia. This medicine may be used for other purposes; ask your health care provider orpharmacist if you have questions. COMMON BRAND NAME(S): Feraheme What should I tell my care team before I take this medication? They need to know if you have any of these conditions: anemia not caused by low iron levels high levels of iron in the blood magnetic resonance imaging (MRI) test scheduled an unusual or allergic reaction to iron, other medicines, foods, dyes, or preservatives pregnant or trying to get pregnant breast-feeding How should I use this medication? This medicine is for injection into a vein. It is given by a health careprofessional in a hospital or clinic setting. Talk to your pediatrician regarding the use of this medicine in children.Special care may be needed. Overdosage: If you think you have taken too much of this medicine contact apoison control center or emergency room at once. NOTE: This medicine is only for you. Do not share this medicine with others. What if I miss a dose? It is important not to miss your dose. Call your doctor or health careprofessional if you are unable to keep an appointment. What may interact with this medication? This medicine may interact with the following medications: other iron products This list may not describe all possible interactions. Give your health care provider a list of all the medicines, herbs, non-prescription drugs, or dietary supplements you use. Also tell them if you smoke, drink alcohol, or use illegaldrugs. Some items may interact with your medicine. What should I watch for while using this medication? Visit your doctor or healthcare professional regularly. Tell your doctor or healthcare professional if your  symptoms do not start to get better or if theyget worse. You may need blood work done while you are taking this medicine. You may need to follow a special diet. Talk to your doctor. Foods that contain iron include: whole grains/cereals, dried fruits, beans, or peas, leafy greenvegetables, and organ meats (liver, kidney). What side effects may I notice from receiving this medication? Side effects that you should report to your doctor or health care professionalas soon as possible: allergic reactions like skin rash, itching or hives, swelling of the face, lips, or tongue breathing problems changes in blood pressure feeling faint or lightheaded, falls fever or chills flushing, sweating, or hot feelings swelling of the ankles or feet Side effects that usually do not require medical attention (report to yourdoctor or health care professional if they continue or are bothersome): diarrhea headache nausea, vomiting stomach pain This list may not describe all possible side effects. Call your doctor for medical advice about side effects. You may report side effects to FDA at1-800-FDA-1088. Where should I keep my medication? This drug is given in a hospital or clinic and will not be stored at home. NOTE: This sheet is a summary. It may not cover all possible information. If you have questions about this medicine, talk to your doctor, pharmacist, orhealth care provider.  2022 Elsevier/Gold Standard (2016-04-05 20:21:10)  

## 2020-10-09 ENCOUNTER — Encounter: Payer: Self-pay | Admitting: *Deleted

## 2020-10-09 LAB — BCR/ABL

## 2020-10-13 ENCOUNTER — Other Ambulatory Visit (HOSPITAL_COMMUNITY): Payer: Self-pay

## 2020-10-13 ENCOUNTER — Other Ambulatory Visit: Payer: Self-pay | Admitting: *Deleted

## 2020-10-13 DIAGNOSIS — K591 Functional diarrhea: Secondary | ICD-10-CM

## 2020-10-13 DIAGNOSIS — C9210 Chronic myeloid leukemia, BCR/ABL-positive, not having achieved remission: Secondary | ICD-10-CM

## 2020-10-13 MED ORDER — DIPHENOXYLATE-ATROPINE 2.5-0.025 MG PO TABS: 1.0000 | ORAL_TABLET | Freq: Four times a day (QID) | ORAL | 1 refills | Status: DC | PRN

## 2020-10-14 ENCOUNTER — Inpatient Hospital Stay: Payer: Medicaid Other

## 2020-10-15 ENCOUNTER — Other Ambulatory Visit (HOSPITAL_COMMUNITY): Payer: Self-pay

## 2020-10-17 ENCOUNTER — Other Ambulatory Visit (HOSPITAL_COMMUNITY): Payer: Self-pay

## 2020-10-30 ENCOUNTER — Other Ambulatory Visit (HOSPITAL_COMMUNITY): Payer: Self-pay

## 2020-10-31 ENCOUNTER — Other Ambulatory Visit (HOSPITAL_COMMUNITY): Payer: Self-pay

## 2020-11-18 ENCOUNTER — Other Ambulatory Visit (HOSPITAL_COMMUNITY): Payer: Self-pay

## 2020-11-20 ENCOUNTER — Inpatient Hospital Stay (HOSPITAL_BASED_OUTPATIENT_CLINIC_OR_DEPARTMENT_OTHER): Payer: Medicaid Other | Admitting: Hematology & Oncology

## 2020-11-20 ENCOUNTER — Other Ambulatory Visit (HOSPITAL_COMMUNITY): Payer: Self-pay

## 2020-11-20 ENCOUNTER — Encounter: Payer: Self-pay | Admitting: Hematology & Oncology

## 2020-11-20 ENCOUNTER — Other Ambulatory Visit: Payer: Self-pay

## 2020-11-20 ENCOUNTER — Inpatient Hospital Stay: Payer: Medicaid Other | Attending: Hematology & Oncology

## 2020-11-20 VITALS — BP 99/64 | HR 90 | Temp 98.4°F | Resp 18 | Ht 65.0 in | Wt 257.8 lb

## 2020-11-20 DIAGNOSIS — C9210 Chronic myeloid leukemia, BCR/ABL-positive, not having achieved remission: Secondary | ICD-10-CM

## 2020-11-20 DIAGNOSIS — R197 Diarrhea, unspecified: Secondary | ICD-10-CM | POA: Insufficient documentation

## 2020-11-20 DIAGNOSIS — D509 Iron deficiency anemia, unspecified: Secondary | ICD-10-CM | POA: Diagnosis present

## 2020-11-20 DIAGNOSIS — C9310 Chronic myelomonocytic leukemia not having achieved remission: Secondary | ICD-10-CM | POA: Diagnosis present

## 2020-11-20 DIAGNOSIS — E559 Vitamin D deficiency, unspecified: Secondary | ICD-10-CM

## 2020-11-20 DIAGNOSIS — E119 Type 2 diabetes mellitus without complications: Secondary | ICD-10-CM | POA: Diagnosis not present

## 2020-11-20 DIAGNOSIS — E538 Deficiency of other specified B group vitamins: Secondary | ICD-10-CM | POA: Diagnosis not present

## 2020-11-20 LAB — CBC WITH DIFFERENTIAL (CANCER CENTER ONLY)
Abs Immature Granulocytes: 0.05 10*3/uL (ref 0.00–0.07)
Basophils Absolute: 0 10*3/uL (ref 0.0–0.1)
Basophils Relative: 0 %
Eosinophils Absolute: 0 10*3/uL (ref 0.0–0.5)
Eosinophils Relative: 0 %
HCT: 36.1 % (ref 36.0–46.0)
Hemoglobin: 11.8 g/dL — ABNORMAL LOW (ref 12.0–15.0)
Immature Granulocytes: 1 %
Lymphocytes Relative: 15 %
Lymphs Abs: 1.5 10*3/uL (ref 0.7–4.0)
MCH: 30.3 pg (ref 26.0–34.0)
MCHC: 32.7 g/dL (ref 30.0–36.0)
MCV: 92.8 fL (ref 80.0–100.0)
Monocytes Absolute: 0.4 10*3/uL (ref 0.1–1.0)
Monocytes Relative: 4 %
Neutro Abs: 8.4 10*3/uL — ABNORMAL HIGH (ref 1.7–7.7)
Neutrophils Relative %: 80 %
Platelet Count: 147 10*3/uL — ABNORMAL LOW (ref 150–400)
RBC: 3.89 MIL/uL (ref 3.87–5.11)
RDW: 16.5 % — ABNORMAL HIGH (ref 11.5–15.5)
WBC Count: 10.4 10*3/uL (ref 4.0–10.5)
nRBC: 0 % (ref 0.0–0.2)

## 2020-11-20 LAB — CMP (CANCER CENTER ONLY)
ALT: 10 U/L (ref 0–44)
AST: 10 U/L — ABNORMAL LOW (ref 15–41)
Albumin: 3.6 g/dL (ref 3.5–5.0)
Alkaline Phosphatase: 99 U/L (ref 38–126)
Anion gap: 12 (ref 5–15)
BUN: 13 mg/dL (ref 6–20)
CO2: 38 mmol/L — ABNORMAL HIGH (ref 22–32)
Calcium: 7.2 mg/dL — ABNORMAL LOW (ref 8.9–10.3)
Chloride: 93 mmol/L — ABNORMAL LOW (ref 98–111)
Creatinine: 1.19 mg/dL — ABNORMAL HIGH (ref 0.44–1.00)
GFR, Estimated: 56 mL/min — ABNORMAL LOW (ref >60)
Glucose, Bld: 112 mg/dL — ABNORMAL HIGH (ref 70–99)
Potassium: 3.1 mmol/L — ABNORMAL LOW (ref 3.5–5.1)
Sodium: 143 mmol/L (ref 135–145)
Total Bilirubin: 0.7 mg/dL (ref 0.3–1.2)
Total Protein: 6.7 g/dL (ref 6.5–8.1)

## 2020-11-20 LAB — SAVE SMEAR(SSMR), FOR PROVIDER SLIDE REVIEW

## 2020-11-20 LAB — LACTATE DEHYDROGENASE: LDH: 328 U/L — ABNORMAL HIGH (ref 98–192)

## 2020-11-20 MED ORDER — PANCRELIPASE (LIP-PROT-AMYL) 36000-114000 UNITS PO CPEP: ORAL_CAPSULE | ORAL | 5 refills | Status: DC

## 2020-11-20 MED ORDER — POTASSIUM CHLORIDE CRYS ER 20 MEQ PO TBCR: 20.0000 meq | EXTENDED_RELEASE_TABLET | Freq: Every day | ORAL | 4 refills | Status: DC

## 2020-11-20 NOTE — Progress Notes (Signed)
Hematology and Oncology Follow Up Visit  Tabitha Estrada [id number] 26-Jul-1970 50 y.o. 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 Crabbe is here today for follow-up.  The main problem that she is having is diarrhea.  She is having diarrhea several times a day.  I do not know if this is from the Scemblix.  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  I told her to stop the Scemblix.  I do not think will be to hurt to stop the Scemblix right now.  I think that given her last BCR/ABL of 1.2%, the Scemblix clearly is working well.  She will be prescribed some Creon.  Maybe, the Creon will help with the diarrhea.  She has had no bleeding.  She has had no problems with cough.  There is no COVID problems.  There is been no urinary issues.  She has had no dysuria.  She has had no problems with leg swelling.  Patient does have some ecchymoses on her arms.  She is on blood thinner.  I think she is on Plavix and aspirin.  Overall, I would have to say that her performance status is probably ECOG 1.    Medications:  Allergies as of 11/20/2020       Reactions   Dulaglutide Nausea And Vomiting, Other (See Comments)   Elemental Sulfur Nausea And Vomiting   Liraglutide Nausea And Vomiting   Metformin Diarrhea   Sulfamethoxazole Nausea And Vomiting        Medication List        Accurate as of November 20, 2020  1:34 PM. If you have any questions, ask your nurse or doctor.           albuterol 108 (90 Base) MCG/ACT inhaler Commonly known as: VENTOLIN HFA Inhale 1-2 puffs into the lungs every 4 (four) hours as needed for wheezing or shortness of breath.   albuterol (2.5 MG/3ML) 0.083% nebulizer solution Commonly known as: PROVENTIL Inhale the contents of one vial in nebulizer every four to six hours as needed for cough or wheeze.   aluminum-magnesium hydroxide-simethicone 588-502-77 MG/5ML Susp Commonly known as: MAALOX Take by mouth daily as needed.   Aspirin Low Dose 81 MG EC tablet Generic drug: aspirin Take 81 mg by mouth daily.   atorvastatin 80 MG tablet Commonly known as: LIPITOR TAKE 1 TABLET (80 MG TOTAL) BY MOUTH DAILY. NEEDS LAB WORK   budesonide-formoterol 160-4.5 MCG/ACT inhaler Commonly known as: SYMBICORT Inhale 2 puffs into the lungs 2 (two) times daily.   clobetasol ointment 0.05 % Commonly known as: TEMOVATE Apply 1 application topically 2 (two) times daily.   clopidogrel 75 MG tablet Commonly known as: PLAVIX Take 75 mg by mouth daily.   colesevelam 625 MG tablet Commonly known as: WELCHOL Take 1,875 mg by mouth 2 (two) times daily.   cyclobenzaprine 10 MG tablet Commonly known  as: FLEXERIL Take 10 mg by mouth daily as needed.   Dexcom G6 Sensor Misc Apply topically.   dextromethorphan 30 MG/5ML liquid Commonly known as: DELSYM Take by mouth.   diclofenac Sodium 1 % Gel Commonly known as: VOLTAREN SMARTSIG:Gram(s) Topical Twice Daily   diphenoxylate-atropine 2.5-0.025 MG tablet Commonly known as: LOMOTIL Take 1 tablet by mouth 4 (four) times daily as needed for diarrhea or loose stools (as needed after taking Sprycel for diarrhea).   fluticasone 50 MCG/ACT nasal spray Commonly known as: FLONASE Use two sprays in each nostril once daily as directed.   hyoscyamine 0.125 MG Tbdp disintergrating tablet Commonly known as: ANASPAZ Take by mouth.   insulin aspart 100 UNIT/ML FlexPen Commonly known as: NovoLOG  FlexPen Inject 40 Units into the skin daily with supper. What changed:  how much to take additional instructions   insulin glargine (1 Unit Dial) 300 UNIT/ML Solostar Pen Commonly known as: TOUJEO Inject 70 Units into the skin daily.   ketoconazole 2 % cream Commonly known as: NIZORAL Apply topically 2 (two) times a day.   Magnesium Chloride 64 MG Tbec Take 64 mg by mouth daily.   meclizine 25 MG tablet Commonly known as: ANTIVERT Take 1 tablet (25 mg total) by mouth 3 (three) times daily as needed for dizziness.   meloxicam 7.5 MG tablet Commonly known as: MOBIC Take 7.5 mg by mouth daily.   montelukast 10 MG tablet Commonly known as: SINGULAIR TAKE 1 TABLET (10 MG TOTAL) BY MOUTH DAILY   nicotine 21 mg/24hr patch Commonly known as: NICODERM CQ - dosed in mg/24 hours 21 mg daily.   omeprazole 40 MG capsule Commonly known as: PRILOSEC Take 40 mg by mouth daily.   pregabalin 200 MG capsule Commonly known as: LYRICA TAKE 1 CAPSULE BY MOUTH 2 TIMES DAILY.   prochlorperazine 10 MG tablet Commonly known as: COMPAZINE TAKE 1 TABLET BY MOUTH EVERY 6 HOURS AS NEEDED FOR NAUSEA AND VOMITING   Scemblix 20 MG Tabs Generic drug: Asciminib HCl Take 2 tabs (40 mg) by mouth daily.   tiZANidine 4 MG tablet Commonly known as: ZANAFLEX Take by mouth daily as needed.   torsemide 20 MG tablet Commonly known as: DEMADEX Take 1 tablet by mouth daily.   valACYclovir 500 MG tablet Commonly known as: VALTREX TAKE ONE TABLET BY MOUTH AT BEDTIME   valsartan 320 MG tablet Commonly known as: DIOVAN Take by mouth daily.        Allergies:  Allergies  Allergen Reactions   Dulaglutide Nausea And Vomiting and Other (See Comments)   Elemental Sulfur Nausea And Vomiting   Liraglutide Nausea And Vomiting   Metformin Diarrhea   Sulfamethoxazole Nausea And Vomiting    Past Medical History, Surgical history, Social history, and Family History were reviewed and  updated.  Review of Systems: Review of Systems  Constitutional: Negative.   Eyes: Negative.   Respiratory:  Positive for cough.   Cardiovascular:  Positive for palpitations.  Gastrointestinal:  Positive for diarrhea.  Genitourinary: Negative.   Musculoskeletal:  Positive for back pain and neck pain.  Skin:  Positive for rash.  Neurological:  Positive for headaches.  Endo/Heme/Allergies: Negative.   Psychiatric/Behavioral: Negative.      Physical Exam:  height is 5\' 5"  (1.651 m) and weight is 257 lb 12.8 oz (116.9 kg). Her oral temperature is 98.4 F (36.9 C). Her blood pressure is 99/64 and her pulse is 90. Her respiration is 18 and oxygen saturation is 97%.  Wt Readings from Last 3 Encounters:  11/20/20 257 lb 12.8 oz (116.9 kg)  08/29/20 258 lb (117 kg)  06/12/20 260 lb (117.9 kg)  Her vital signs are temperature of 97.9.  Pulse 81.  Blood pressure 118/58.  Weight is 269 pounds.  Physical Exam Vitals reviewed.  HENT:     Head: Normocephalic and atraumatic.  Eyes:     Pupils: Pupils are equal, round, and reactive to light.  Cardiovascular:     Rate and Rhythm: Normal rate and regular rhythm.     Heart sounds: Normal heart sounds.  Pulmonary:     Effort: Pulmonary effort is normal.     Breath sounds: Normal breath sounds.  Abdominal:     General: Bowel sounds are normal.     Palpations: Abdomen is soft.  Musculoskeletal:        General: No tenderness or deformity. Normal range of motion.     Cervical back: Normal range of motion.  Lymphadenopathy:     Cervical: No cervical adenopathy.  Skin:    General: Skin is warm and dry.     Findings: No erythema or rash.  Neurological:     Mental Status: She is alert and oriented to person, place, and time.  Psychiatric:        Behavior: Behavior normal.        Thought Content: Thought content normal.        Judgment: Judgment normal.     Lab Results  Component Value Date   WBC 10.4 11/20/2020   HGB 11.8 (L)  11/20/2020   HCT 36.1 11/20/2020   MCV 92.8 11/20/2020   PLT 147 (L) 11/20/2020   Lab Results  Component Value Date   FERRITIN 123 08/29/2020   IRON 47 08/29/2020   TIBC 268 08/29/2020   UIBC 221 08/29/2020   IRONPCTSAT 18 (L) 08/29/2020   Lab Results  Component Value Date   RETICCTPCT 1.5 08/29/2020   RBC 3.89 11/20/2020   No results found for: Nils Pyle, [facility] Lab Results  Component Value Date   IGGSERUM 675 (L) 11/10/2016   IGMSERUM 86 11/10/2016   No results found for: Odetta Pink, SPEI   Chemistry      Component Value Date/Time   NA 138 08/29/2020 0746   NA 144 01/27/2017 1302   NA 138 09/16/2016 1105   K 3.6 08/29/2020 0746   K 4.3 01/27/2017 1302   K 4.2 09/16/2016 1105   CL 93 (L) 08/29/2020 0746   CL 102 01/27/2017 1302   CO2 36 (H) 08/29/2020 0746   CO2 27 01/27/2017 1302   CO2 26 09/16/2016 1105   BUN 19 08/29/2020 0746   BUN 14 01/27/2017 1302   BUN 11.9 09/16/2016 1105   CREATININE 1.19 (H) 08/29/2020 0746   CREATININE 1.1 01/27/2017 1302   CREATININE 0.8 09/16/2016 1105      Component Value Date/Time   CALCIUM 8.7 (L) 08/29/2020 0746   CALCIUM 9.5 01/27/2017 1302   CALCIUM 9.0 09/16/2016 1105   ALKPHOS 114 08/29/2020 0746   ALKPHOS 116 (H) 01/27/2017 1302   ALKPHOS 136 09/16/2016 1105   AST 6 (L) 08/29/2020 0746   AST 8 09/16/2016 1105   ALT 9 08/29/2020 0746   ALT 25 01/27/2017 1302   ALT 12 09/16/2016 1105   BILITOT 0.5 08/29/2020 0746   BILITOT 0.32 09/16/2016 1105       Impression and Plan: Ms. Freimuth is a very pleasant 49  yo with chronic phase CML.  We had a really hard time trying to treat this.  We made a switch over to Scemblix.  The Scemblix has worked well.  However, the Scemblix might be causing her diarrhea.  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  Again we will stop the Scemblix until we see her back.  She will start Creon.  I  told her to call us in a week.  She will let us know if the diarrhea is better.  I really think that the Scemblix is the way to go for her CML.  Again she has responded very nicely.  Her blood counts look great.  I do not see anything on her blood smear that look like active disease.  Her quality of life this is not that great because of the diarrhea.  She really cannot go anywhere.  I really hate this for her.  I would like to get her back to see me in another 3 weeks or so.    [name], MD 9/22/20221:34 PM

## 2020-11-21 ENCOUNTER — Telehealth: Payer: Self-pay

## 2020-11-21 LAB — IRON AND TIBC
Iron: 46 ug/dL (ref 41–142)
Saturation Ratios: 21 % (ref 21–57)
TIBC: 223 ug/dL — ABNORMAL LOW (ref 236–444)
UIBC: 177 ug/dL (ref 120–384)

## 2020-11-21 LAB — FERRITIN: Ferritin: 352 ng/mL — ABNORMAL HIGH (ref 11–307)

## 2020-11-21 NOTE — Telephone Encounter (Signed)
-----   Message from [name], MD sent at 11/21/2020  2:12 PM EDT ----- Please call and tell her that the iron level is actually okay.  Tabitha Estrada

## 2020-11-21 NOTE — Telephone Encounter (Signed)
Responded via MyChart.

## 2020-11-24 ENCOUNTER — Other Ambulatory Visit (HOSPITAL_COMMUNITY): Payer: Self-pay

## 2020-12-04 LAB — BCR/ABL

## 2020-12-11 ENCOUNTER — Other Ambulatory Visit: Payer: Medicaid Other

## 2020-12-11 ENCOUNTER — Ambulatory Visit: Payer: Medicaid Other | Admitting: Hematology & Oncology

## 2020-12-17 ENCOUNTER — Other Ambulatory Visit (HOSPITAL_COMMUNITY): Payer: Self-pay

## 2021-01-05 ENCOUNTER — Other Ambulatory Visit: Payer: Self-pay

## 2021-01-05 ENCOUNTER — Encounter: Payer: Self-pay | Admitting: Hematology & Oncology

## 2021-01-05 ENCOUNTER — Telehealth: Payer: Self-pay | Admitting: *Deleted

## 2021-01-05 ENCOUNTER — Inpatient Hospital Stay (HOSPITAL_BASED_OUTPATIENT_CLINIC_OR_DEPARTMENT_OTHER): Payer: Medicaid Other | Admitting: Hematology & Oncology

## 2021-01-05 ENCOUNTER — Inpatient Hospital Stay: Payer: Medicaid Other | Attending: Hematology & Oncology

## 2021-01-05 VITALS — BP 103/60 | HR 90 | Temp 98.4°F | Resp 18 | Wt 258.0 lb

## 2021-01-05 DIAGNOSIS — E559 Vitamin D deficiency, unspecified: Secondary | ICD-10-CM

## 2021-01-05 DIAGNOSIS — C9210 Chronic myeloid leukemia, BCR/ABL-positive, not having achieved remission: Secondary | ICD-10-CM

## 2021-01-05 DIAGNOSIS — C9310 Chronic myelomonocytic leukemia not having achieved remission: Secondary | ICD-10-CM | POA: Diagnosis present

## 2021-01-05 DIAGNOSIS — R197 Diarrhea, unspecified: Secondary | ICD-10-CM | POA: Diagnosis not present

## 2021-01-05 DIAGNOSIS — E538 Deficiency of other specified B group vitamins: Secondary | ICD-10-CM

## 2021-01-05 DIAGNOSIS — D509 Iron deficiency anemia, unspecified: Secondary | ICD-10-CM | POA: Diagnosis present

## 2021-01-05 LAB — CBC WITH DIFFERENTIAL (CANCER CENTER ONLY)
Abs Immature Granulocytes: 0.09 10*3/uL — ABNORMAL HIGH (ref 0.00–0.07)
Basophils Absolute: 0.1 10*3/uL (ref 0.0–0.1)
Basophils Relative: 1 %
Eosinophils Absolute: 0 10*3/uL (ref 0.0–0.5)
Eosinophils Relative: 0 %
HCT: 42.1 % (ref 36.0–46.0)
Hemoglobin: 13.6 g/dL (ref 12.0–15.0)
Immature Granulocytes: 1 %
Lymphocytes Relative: 15 %
Lymphs Abs: 1.5 10*3/uL (ref 0.7–4.0)
MCH: 30.9 pg (ref 26.0–34.0)
MCHC: 32.3 g/dL (ref 30.0–36.0)
MCV: 95.7 fL (ref 80.0–100.0)
Monocytes Absolute: 0.4 10*3/uL (ref 0.1–1.0)
Monocytes Relative: 4 %
Neutro Abs: 7.4 10*3/uL (ref 1.7–7.7)
Neutrophils Relative %: 79 %
Platelet Count: 164 10*3/uL (ref 150–400)
RBC: 4.4 MIL/uL (ref 3.87–5.11)
RDW: 14.7 % (ref 11.5–15.5)
WBC Count: 9.5 10*3/uL (ref 4.0–10.5)
nRBC: 0 % (ref 0.0–0.2)

## 2021-01-05 LAB — CMP (CANCER CENTER ONLY)
ALT: 24 U/L (ref 0–44)
AST: 25 U/L (ref 15–41)
Albumin: 3.9 g/dL (ref 3.5–5.0)
Alkaline Phosphatase: 108 U/L (ref 38–126)
Anion gap: 9 (ref 5–15)
BUN: 19 mg/dL (ref 6–20)
CO2: 33 mmol/L — ABNORMAL HIGH (ref 22–32)
Calcium: 10 mg/dL (ref 8.9–10.3)
Chloride: 96 mmol/L — ABNORMAL LOW (ref 98–111)
Creatinine: 1.35 mg/dL — ABNORMAL HIGH (ref 0.44–1.00)
GFR, Estimated: 48 mL/min — ABNORMAL LOW (ref >60)
Glucose, Bld: 185 mg/dL — ABNORMAL HIGH (ref 70–99)
Potassium: 4.5 mmol/L (ref 3.5–5.1)
Sodium: 138 mmol/L (ref 135–145)
Total Bilirubin: 0.4 mg/dL (ref 0.3–1.2)
Total Protein: 6.6 g/dL (ref 6.5–8.1)

## 2021-01-05 LAB — VITAMIN D 25 HYDROXY (VIT D DEFICIENCY, FRACTURES): Vit D, 25-Hydroxy: 58.11 ng/mL (ref 30–100)

## 2021-01-05 LAB — MAGNESIUM: Magnesium: 1.6 mg/dL — ABNORMAL LOW (ref 1.7–2.4)

## 2021-01-05 NOTE — Telephone Encounter (Signed)
Per 01/05/21 los - gave upcoming appointments - confirmed 

## 2021-01-05 NOTE — Progress Notes (Signed)
Hematology and Oncology Follow Up Visit  Tabitha Estrada [id number] 1970/03/06 50 y.o. 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  Diarrhea is much better now.  Unfortunately, I think the diarrhea was from the Scemblix.  I just hate that she has had this..  She had a very nice response to the Scemblix.  Her last BCR/ABL was up to 8.6%.  Again she is not having diarrhea which is nice to see.  Her blood sugars are also doing better.  She says her hemoglobin A1c is down to a little above 8.  She has had no issues with bleeding.  There is no fever.  She has had little bit of a cough.  She has a little bit of congestion.  There is been no leg swelling.  She has had no rashes.  Overall, I would say performance status is probably ECOG 1.     Medications:  Allergies as of 01/05/2021       Reactions   Dulaglutide Nausea And Vomiting, Other (See Comments)   Elemental Sulfur Nausea And Vomiting   Liraglutide Nausea And Vomiting   Metformin Diarrhea   Sulfamethoxazole Nausea And Vomiting        Medication List        Accurate as of January 05, 2021 12:41 PM. If you have any questions, ask your nurse or doctor.          albuterol 108 (90 Base) MCG/ACT inhaler Commonly known as: VENTOLIN HFA Inhale 1-2 puffs into the lungs every 4 (four) hours as needed for wheezing or shortness of breath.   albuterol (2.5 MG/3ML) 0.083% nebulizer solution Commonly known as:  PROVENTIL Inhale the contents of one vial in nebulizer every four to six hours as needed for cough or wheeze.   aluminum-magnesium hydroxide-simethicone 768-115-72 MG/5ML Susp Commonly known as: MAALOX Take by mouth daily as needed.   amLODipine 10 MG tablet Commonly known as: NORVASC Take 10 mg by mouth daily.   Aspirin Low Dose 81 MG EC tablet Generic drug: aspirin Take 81 mg by mouth daily.   atorvastatin 80 MG tablet Commonly known as: LIPITOR TAKE 1 TABLET (80 MG TOTAL) BY MOUTH DAILY. NEEDS LAB WORK   budesonide-formoterol 160-4.5 MCG/ACT inhaler Commonly known as: SYMBICORT Inhale 2 puffs into the lungs 2 (two) times daily.   clobetasol ointment 0.05 % Commonly known as: TEMOVATE Apply 1 application topically 2 (two) times daily.   clopidogrel 75 MG tablet Commonly known as: PLAVIX Take 75 mg by mouth daily.   colesevelam 625 MG tablet Commonly known as: WELCHOL Take 1,875 mg by mouth 2 (two) times daily.   cyclobenzaprine 10 MG tablet Commonly known as: FLEXERIL Take 10 mg by mouth daily as needed.   cyclobenzaprine 10 MG tablet Commonly known as: FLEXERIL Take 10 mg by mouth 3 (three) times daily.   Dexcom G6 Sensor  Misc Apply topically.   dextromethorphan 30 MG/5ML liquid Commonly known as: DELSYM Take by mouth.   diclofenac Sodium 1 % Gel Commonly known as: VOLTAREN SMARTSIG:Gram(s) Topical Twice Daily   diphenoxylate-atropine 2.5-0.025 MG tablet Commonly known as: LOMOTIL Take 1 tablet by mouth 4 (four) times daily as needed for diarrhea or loose stools (as needed after taking Sprycel for diarrhea).   ergocalciferol 1.25 MG (50000 UT) capsule Commonly known as: VITAMIN D2 Take 1 capsule by mouth once a week.   fluconazole 150 MG tablet Commonly known as: DIFLUCAN Take 150 mg by mouth once a week.   fluticasone 50 MCG/ACT nasal spray Commonly known as: FLONASE Use two sprays in each nostril once daily as directed.   folic acid 1 MG  tablet Commonly known as: FOLVITE Take 1 mg by mouth daily.   hyoscyamine 0.125 MG Tbdp disintergrating tablet Commonly known as: ANASPAZ Take by mouth.   insulin aspart 100 UNIT/ML FlexPen Commonly known as: NovoLOG FlexPen Inject 40 Units into the skin daily with supper. What changed:  how much to take additional instructions   insulin glargine (1 Unit Dial) 300 UNIT/ML Solostar Pen Commonly known as: TOUJEO Inject 70 Units into the skin daily.   ketoconazole 2 % cream Commonly known as: NIZORAL Apply topically 2 (two) times a day.   lipase/protease/amylase 36000 UNITS Cpep capsule Commonly known as: Creon Take 2 capsules (72,000 Units total) by mouth 3 (three) times daily with meals. May also take 1 capsule (36,000 Units total) as needed (with snacks).   Magnesium Chloride 64 MG Tbec Take 64 mg by mouth daily.   meclizine 25 MG tablet Commonly known as: ANTIVERT Take 1 tablet (25 mg total) by mouth 3 (three) times daily as needed for dizziness.   meloxicam 7.5 MG tablet Commonly known as: MOBIC Take 7.5 mg by mouth daily.   montelukast 10 MG tablet Commonly known as: SINGULAIR TAKE 1 TABLET (10 MG TOTAL) BY MOUTH DAILY   nicotine 21 mg/24hr patch Commonly known as: NICODERM CQ - dosed in mg/24 hours 21 mg daily.   omeprazole 40 MG capsule Commonly known as: PRILOSEC Take 40 mg by mouth daily.   potassium chloride SA 20 MEQ tablet Commonly known as: KLOR-CON Take 1 tablet (20 mEq total) by mouth daily.   pregabalin 200 MG capsule Commonly known as: LYRICA Take 1 capsule by mouth 2 (two) times daily. What changed: Another medication with the same name was removed. Continue taking this medication, and follow the directions you see here. Changed by: [name], MD   prochlorperazine 10 MG tablet Commonly known as: COMPAZINE TAKE 1 TABLET BY MOUTH EVERY 6 HOURS AS NEEDED FOR NAUSEA AND VOMITING   Scemblix 20 MG Tabs Generic drug: Asciminib HCl Take  2 tabs (40 mg) by mouth daily.   tiZANidine 4 MG tablet Commonly known as: ZANAFLEX Take by mouth daily as needed.   torsemide 20 MG tablet Commonly known as: DEMADEX Take 1 tablet by mouth daily.   valACYclovir 500 MG tablet Commonly known as: VALTREX TAKE ONE TABLET BY MOUTH AT BEDTIME   valsartan 320 MG tablet Commonly known as: DIOVAN Take by mouth daily.        Allergies:  Allergies  Allergen Reactions   Dulaglutide Nausea And Vomiting and Other (See Comments)   Elemental Sulfur Nausea And Vomiting   Liraglutide Nausea And Vomiting   Metformin Diarrhea   Sulfamethoxazole Nausea And Vomiting    Past Medical History, Surgical history, Social  history, and Family History were reviewed and updated.  Review of Systems: Review of Systems  Constitutional: Negative.   Eyes: Negative.   Respiratory:  Positive for cough.   Cardiovascular:  Positive for palpitations.  Gastrointestinal:  Positive for diarrhea.  Genitourinary: Negative.   Musculoskeletal:  Positive for back pain and neck pain.  Skin:  Positive for rash.  Neurological:  Positive for headaches.  Endo/Heme/Allergies: Negative.   Psychiatric/Behavioral: Negative.      Physical Exam:  vitals were not taken for this visit.   Wt Readings from Last 3 Encounters:  11/20/20 257 lb 12.8 oz (116.9 kg)  08/29/20 258 lb (117 kg)  06/12/20 260 lb (117.9 kg)  Her vital signs are temperature of 97.9.  Pulse 81.  Blood pressure 118/58.  Weight is 269 pounds.  Physical Exam Vitals reviewed.  HENT:     Head: Normocephalic and atraumatic.  Eyes:     Pupils: Pupils are equal, round, and reactive to light.  Cardiovascular:     Rate and Rhythm: Normal rate and regular rhythm.     Heart sounds: Normal heart sounds.  Pulmonary:     Effort: Pulmonary effort is normal.     Breath sounds: Normal breath sounds.  Abdominal:     General: Bowel sounds are normal.     Palpations: Abdomen is soft.  Musculoskeletal:         General: No tenderness or deformity. Normal range of motion.     Cervical back: Normal range of motion.  Lymphadenopathy:     Cervical: No cervical adenopathy.  Skin:    General: Skin is warm and dry.     Findings: No erythema or rash.  Neurological:     Mental Status: She is alert and oriented to person, place, and time.  Psychiatric:        Behavior: Behavior normal.        Thought Content: Thought content normal.        Judgment: Judgment normal.     Lab Results  Component Value Date   WBC 9.5 01/05/2021   HGB 13.6 01/05/2021   HCT 42.1 01/05/2021   MCV 95.7 01/05/2021   PLT 164 01/05/2021   Lab Results  Component Value Date   FERRITIN 352 (H) 11/20/2020   IRON 46 11/20/2020   TIBC 223 (L) 11/20/2020   UIBC 177 11/20/2020   IRONPCTSAT 21 11/20/2020   Lab Results  Component Value Date   RETICCTPCT 1.5 08/29/2020   RBC 4.40 01/05/2021   No results found for: KPAFRELGTCHN, LAMBDASER, KAPLAMBRATIO Lab Results  Component Value Date   IGGSERUM 675 (L) 11/10/2016   IGMSERUM 86 11/10/2016   No results found for: Odetta Pink, SPEI   Chemistry      Component Value Date/Time   NA 138 01/05/2021 1159   NA 144 01/27/2017 1302   NA 138 09/16/2016 1105   K 4.5 01/05/2021 1159   K 4.3 01/27/2017 1302   K 4.2 09/16/2016 1105   CL 96 (L) 01/05/2021 1159   CL 102 01/27/2017 1302   CO2 33 (H) 01/05/2021 1159   CO2 27 01/27/2017 1302   CO2 26 09/16/2016 1105   BUN 19 01/05/2021 1159   BUN 14 01/27/2017 1302   BUN 11.9 09/16/2016 1105   CREATININE 1.35 (H) 01/05/2021 1159   CREATININE 1.1 01/27/2017 1302   CREATININE 0.8 09/16/2016 1105      Component Value Date/Time   CALCIUM 10.0 01/05/2021 1159  CALCIUM 9.5 01/27/2017 1302   CALCIUM 9.0 09/16/2016 1105   ALKPHOS 108 01/05/2021 1159   ALKPHOS 116 (H) 01/27/2017 1302   ALKPHOS 136 09/16/2016 1105   AST 25 01/05/2021 1159   AST 8 09/16/2016 1105   ALT 24  01/05/2021 1159   ALT 25 01/27/2017 1302   ALT 12 09/16/2016 1105   BILITOT 0.4 01/05/2021 1159   BILITOT 0.32 09/16/2016 1105       Impression and Plan: Ms. Hulon is a very pleasant 50 yo with chronic phase CML.  We had a really hard time trying to treat this.  We made a switch over to Scemblix.  The Scemblix has worked well.  However, the Scemblix might be causing her diarrhea.    We will continue to watch her for right now.  Again her blood counts are looking okay.  We do not have to make any type of changes right now.  As to what we can treat her with, this is going to be an issue.  She has an intolerance to TKI medications.  She cannot tolerate the Scemblix.  Maybe, if we decrease the dose of Scemblix a little bit more she might do okay with this.  We will probably have to get her back in about 6 weeks.  I think this would be reasonable for follow-up.     [name], MD 11/7/202212:41 PM

## 2021-01-07 ENCOUNTER — Other Ambulatory Visit (HOSPITAL_COMMUNITY): Payer: Self-pay

## 2021-01-12 ENCOUNTER — Encounter: Payer: Self-pay | Admitting: *Deleted

## 2021-01-12 LAB — BCR/ABL

## 2021-01-26 ENCOUNTER — Other Ambulatory Visit (HOSPITAL_COMMUNITY): Payer: Self-pay

## 2021-02-16 ENCOUNTER — Encounter: Payer: Self-pay | Admitting: Hematology & Oncology

## 2021-02-16 ENCOUNTER — Inpatient Hospital Stay: Payer: Medicaid Other | Attending: Hematology & Oncology

## 2021-02-16 ENCOUNTER — Other Ambulatory Visit: Payer: Self-pay

## 2021-02-16 ENCOUNTER — Inpatient Hospital Stay (HOSPITAL_BASED_OUTPATIENT_CLINIC_OR_DEPARTMENT_OTHER): Payer: Medicaid Other | Admitting: Hematology & Oncology

## 2021-02-16 VITALS — BP 108/63 | HR 74 | Temp 98.1°F | Resp 19 | Wt 264.0 lb

## 2021-02-16 DIAGNOSIS — C9210 Chronic myeloid leukemia, BCR/ABL-positive, not having achieved remission: Secondary | ICD-10-CM

## 2021-02-16 DIAGNOSIS — E119 Type 2 diabetes mellitus without complications: Secondary | ICD-10-CM | POA: Insufficient documentation

## 2021-02-16 DIAGNOSIS — R197 Diarrhea, unspecified: Secondary | ICD-10-CM | POA: Insufficient documentation

## 2021-02-16 DIAGNOSIS — D509 Iron deficiency anemia, unspecified: Secondary | ICD-10-CM | POA: Insufficient documentation

## 2021-02-16 DIAGNOSIS — C9310 Chronic myelomonocytic leukemia not having achieved remission: Secondary | ICD-10-CM | POA: Insufficient documentation

## 2021-02-16 LAB — CBC WITH DIFFERENTIAL (CANCER CENTER ONLY)
Abs Immature Granulocytes: 1.14 10*3/uL — ABNORMAL HIGH (ref 0.00–0.07)
Basophils Absolute: 0.4 10*3/uL — ABNORMAL HIGH (ref 0.0–0.1)
Basophils Relative: 3 %
Eosinophils Absolute: 0.1 10*3/uL (ref 0.0–0.5)
Eosinophils Relative: 1 %
HCT: 35.4 % — ABNORMAL LOW (ref 36.0–46.0)
Hemoglobin: 11.4 g/dL — ABNORMAL LOW (ref 12.0–15.0)
Immature Granulocytes: 8 %
Lymphocytes Relative: 16 %
Lymphs Abs: 2.3 10*3/uL (ref 0.7–4.0)
MCH: 31.7 pg (ref 26.0–34.0)
MCHC: 32.2 g/dL (ref 30.0–36.0)
MCV: 98.3 fL (ref 80.0–100.0)
Monocytes Absolute: 0.5 10*3/uL (ref 0.1–1.0)
Monocytes Relative: 3 %
Neutro Abs: 10.6 10*3/uL — ABNORMAL HIGH (ref 1.7–7.7)
Neutrophils Relative %: 69 %
Platelet Count: 293 10*3/uL (ref 150–400)
RBC: 3.6 MIL/uL — ABNORMAL LOW (ref 3.87–5.11)
RDW: 16.1 % — ABNORMAL HIGH (ref 11.5–15.5)
WBC Count: 15 10*3/uL — ABNORMAL HIGH (ref 4.0–10.5)
nRBC: 0.4 % — ABNORMAL HIGH (ref 0.0–0.2)

## 2021-02-16 LAB — SAVE SMEAR(SSMR), FOR PROVIDER SLIDE REVIEW

## 2021-02-16 LAB — CMP (CANCER CENTER ONLY)
ALT: 13 U/L (ref 0–44)
AST: 11 U/L — ABNORMAL LOW (ref 15–41)
Albumin: 3.5 g/dL (ref 3.5–5.0)
Alkaline Phosphatase: 91 U/L (ref 38–126)
Anion gap: 7 (ref 5–15)
BUN: 12 mg/dL (ref 6–20)
CO2: 35 mmol/L — ABNORMAL HIGH (ref 22–32)
Calcium: 9 mg/dL (ref 8.9–10.3)
Chloride: 102 mmol/L (ref 98–111)
Creatinine: 1.27 mg/dL — ABNORMAL HIGH (ref 0.44–1.00)
GFR, Estimated: 52 mL/min — ABNORMAL LOW (ref >60)
Glucose, Bld: 72 mg/dL (ref 70–99)
Potassium: 4.2 mmol/L (ref 3.5–5.1)
Sodium: 144 mmol/L (ref 135–145)
Total Bilirubin: 0.4 mg/dL (ref 0.3–1.2)
Total Protein: 6 g/dL — ABNORMAL LOW (ref 6.5–8.1)

## 2021-02-16 LAB — LACTATE DEHYDROGENASE: LDH: 338 U/L — ABNORMAL HIGH (ref 98–192)

## 2021-02-16 LAB — HEMOGLOBIN A1C
Hgb A1c MFr Bld: 11.9 % — ABNORMAL HIGH (ref 4.8–5.6)
Mean Plasma Glucose: 294.83 mg/dL

## 2021-02-16 MED ORDER — SCEMBLIX 20 MG PO TABS: 20.0000 mg | ORAL_TABLET | Freq: Every day | ORAL | 4 refills | Status: DC

## 2021-02-16 NOTE — Progress Notes (Signed)
Hematology and Oncology Follow Up Visit  Tabitha Estrada [id number] 02-14-1971 50 y.o. 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  She has her myriad of health issues.  She was recently in the emergency room.  She does have a lot of sinus problems.  She is given some steroids and antibiotic for this.  This might be a little bit better..  She has asthma.  She is on nebulizers and inhalers.  She still is wheezing a little bit.  She is having lower back discomfort.  She is having problems with swallowing.  She says she has Barrett's esophagus was not been evaluated for a while as she said her Gastroenterologist would not take her insurance.  She has a BCR/ABL increasing.  We last saw her in November it was up to 18.7%.  Regarding have to get her back onto treatment.  I will try Scemblix at 20 mg a day.  We will see if she tolerates this well.  If not, then we will have to go with interferon or possibly homoharringtonine.both are injectable.    She has little bit of swelling in the legs.  I suspect this might be from the steroids that she was taking.  She is having some urinary issues.  Again, she has a myriad of health problems.  She is on 3 pages of medications.  I applaud her for being able to take all these medicines.  She has had no rashes.  Overall, I would say her performance status is ECOG 1.     Medications:  Allergies as of 02/16/2021       Reactions   Dulaglutide Nausea And Vomiting, Other  (See Comments)   Elemental Sulfur Nausea And Vomiting   Liraglutide Nausea And Vomiting   Metformin Diarrhea   Sulfamethoxazole Nausea And Vomiting        Medication List        Accurate as of February 16, 2021 12:32 PM. If you have any questions, ask your nurse or doctor.          albuterol 108 (90 Base) MCG/ACT inhaler Commonly known as: VENTOLIN HFA Inhale 1-2 puffs into the lungs every 4 (four) hours as needed for wheezing or shortness of breath.   albuterol (2.5 MG/3ML) 0.083% nebulizer solution Commonly known as: PROVENTIL Inhale the contents of one vial in nebulizer every four to six hours as needed for cough or wheeze.   aluminum-magnesium hydroxide-simethicone 903-009-23 MG/5ML Susp Commonly known as: MAALOX Take by mouth daily as needed.   amLODipine 10 MG tablet Commonly known as: NORVASC Take 10 mg by mouth daily.   Aspirin Low Dose 81 MG EC tablet Generic drug: aspirin Take 81 mg by mouth daily.   atorvastatin 80 MG tablet Commonly known as: LIPITOR TAKE 1 TABLET (80 MG TOTAL) BY MOUTH DAILY. NEEDS LAB WORK   budesonide-formoterol 160-4.5 MCG/ACT inhaler Commonly  known as: SYMBICORT Inhale 2 puffs into the lungs 2 (two) times daily.   clobetasol ointment 0.05 % Commonly known as: TEMOVATE Apply 1 application topically 2 (two) times daily.   clopidogrel 75 MG tablet Commonly known as: PLAVIX Take 75 mg by mouth daily.   colesevelam 625 MG tablet Commonly known as: WELCHOL Take 1,875 mg by mouth 2 (two) times daily.   cyclobenzaprine 10 MG tablet Commonly known as: FLEXERIL Take 10 mg by mouth daily as needed.   cyclobenzaprine 10 MG tablet Commonly known as: FLEXERIL Take 10 mg by mouth 3 (three) times daily.   Dexcom G6 Sensor Misc Apply topically.   dextromethorphan 30 MG/5ML liquid Commonly known as: DELSYM Take by mouth.   diclofenac Sodium 1 % Gel Commonly known as: VOLTAREN SMARTSIG:Gram(s) Topical Twice Daily    diphenoxylate-atropine 2.5-0.025 MG tablet Commonly known as: LOMOTIL Take 1 tablet by mouth 4 (four) times daily as needed for diarrhea or loose stools (as needed after taking Sprycel for diarrhea).   doxycycline 100 MG capsule Commonly known as: VIBRAMYCIN Take 100 mg by mouth in the morning and at bedtime.   ergocalciferol 1.25 MG (50000 UT) capsule Commonly known as: VITAMIN D2 Take 1 capsule by mouth once a week.   fluconazole 150 MG tablet Commonly known as: DIFLUCAN Take 150 mg by mouth once a week.   fluticasone 50 MCG/ACT nasal spray Commonly known as: FLONASE Use two sprays in each nostril once daily as directed.   folic acid 1 MG tablet Commonly known as: FOLVITE Take 1 mg by mouth daily.   hyoscyamine 0.125 MG Tbdp disintergrating tablet Commonly known as: ANASPAZ Take by mouth.   insulin aspart 100 UNIT/ML FlexPen Commonly known as: NovoLOG FlexPen Inject 40 Units into the skin daily with supper. What changed:  how much to take additional instructions   insulin glargine (1 Unit Dial) 300 UNIT/ML Solostar Pen Commonly known as: TOUJEO Inject 70 Units into the skin daily.   ketoconazole 2 % cream Commonly known as: NIZORAL Apply topically 2 (two) times a day.   lipase/protease/amylase 36000 UNITS Cpep capsule Commonly known as: Creon Take 2 capsules (72,000 Units total) by mouth 3 (three) times daily with meals. May also take 1 capsule (36,000 Units total) as needed (with snacks).   Magnesium Chloride 64 MG Tbec Take 64 mg by mouth daily.   meclizine 25 MG tablet Commonly known as: ANTIVERT Take 1 tablet (25 mg total) by mouth 3 (three) times daily as needed for dizziness.   meloxicam 7.5 MG tablet Commonly known as: MOBIC Take 7.5 mg by mouth daily.   montelukast 10 MG tablet Commonly known as: SINGULAIR TAKE 1 TABLET (10 MG TOTAL) BY MOUTH DAILY   nicotine 21 mg/24hr patch Commonly known as: NICODERM CQ - dosed in mg/24 hours 21 mg  daily.   omeprazole 40 MG capsule Commonly known as: PRILOSEC Take 40 mg by mouth daily.   potassium chloride SA 20 MEQ tablet Commonly known as: KLOR-CON M Take 1 tablet (20 mEq total) by mouth daily.   pregabalin 200 MG capsule Commonly known as: LYRICA Take 1 capsule by mouth 2 (two) times daily.   prochlorperazine 10 MG tablet Commonly known as: COMPAZINE TAKE 1 TABLET BY MOUTH EVERY 6 HOURS AS NEEDED FOR NAUSEA AND VOMITING   Scemblix 20 MG Tabs Generic drug: Asciminib HCl Take 2 tabs (40 mg) by mouth daily.   tiZANidine 4 MG tablet Commonly known as: ZANAFLEX Take by mouth daily as  needed.   torsemide 20 MG tablet Commonly known as: DEMADEX Take 1 tablet by mouth daily.   valACYclovir 500 MG tablet Commonly known as: VALTREX TAKE ONE TABLET BY MOUTH AT BEDTIME   valsartan 40 MG tablet Commonly known as: DIOVAN Take 40 mg by mouth daily. What changed: Another medication with the same name was removed. Continue taking this medication, and follow the directions you see here. Changed by: [name], MD        Allergies:  Allergies  Allergen Reactions   Dulaglutide Nausea And Vomiting and Other (See Comments)   Elemental Sulfur Nausea And Vomiting   Liraglutide Nausea And Vomiting   Metformin Diarrhea   Sulfamethoxazole Nausea And Vomiting    Past Medical History, Surgical history, Social history, and Family History were reviewed and updated.  Review of Systems: Review of Systems  Constitutional: Negative.   Eyes: Negative.   Respiratory:  Positive for cough.   Cardiovascular:  Positive for palpitations.  Gastrointestinal:  Positive for diarrhea.  Genitourinary: Negative.   Musculoskeletal:  Positive for back pain and neck pain.  Skin:  Positive for rash.  Neurological:  Positive for headaches.  Endo/Heme/Allergies: Negative.   Psychiatric/Behavioral: Negative.      Physical Exam:  weight is 264 lb (119.7 kg). Her oral temperature is 98.1  F (36.7 C). Her blood pressure is 108/63 and her pulse is 74. Her respiration is 19 and oxygen saturation is 99%.   Wt Readings from Last 3 Encounters:  02/16/21 264 lb (119.7 kg)  01/05/21 258 lb (117 kg)  11/20/20 257 lb 12.8 oz (116.9 kg)  Her vital signs are temperature of 97.9.  Pulse 81.  Blood pressure 118/58.  Weight is 269 pounds.  Physical Exam Vitals reviewed.  HENT:     Head: Normocephalic and atraumatic.  Eyes:     Pupils: Pupils are equal, round, and reactive to light.  Cardiovascular:     Rate and Rhythm: Normal rate and regular rhythm.     Heart sounds: Normal heart sounds.  Pulmonary:     Effort: Pulmonary effort is normal.     Breath sounds: Normal breath sounds.  Abdominal:     General: Bowel sounds are normal.     Palpations: Abdomen is soft.  Musculoskeletal:        General: No tenderness or deformity. Normal range of motion.     Cervical back: Normal range of motion.  Lymphadenopathy:     Cervical: No cervical adenopathy.  Skin:    General: Skin is warm and dry.     Findings: No erythema or rash.  Neurological:     Mental Status: She is alert and oriented to person, place, and time.  Psychiatric:        Behavior: Behavior normal.        Thought Content: Thought content normal.        Judgment: Judgment normal.     Lab Results  Component Value Date   WBC 15.0 (H) 02/16/2021   HGB 11.4 (L) 02/16/2021   HCT 35.4 (L) 02/16/2021   MCV 98.3 02/16/2021   PLT 293 02/16/2021   Lab Results  Component Value Date   FERRITIN 352 (H) 11/20/2020   IRON 46 11/20/2020   TIBC 223 (L) 11/20/2020   UIBC 177 11/20/2020   IRONPCTSAT 21 11/20/2020   Lab Results  Component Value Date   RETICCTPCT 1.5 08/29/2020   RBC 3.60 (L) 02/16/2021   No results found for: KPAFRELGTCHN, LAMBDASER, KAPLAMBRATIO  Lab Results  Component Value Date   RWCHJSCB 837 (L) 11/10/2016   IGMSERUM 86 11/10/2016   No results found for: Odetta Pink, SPEI   Chemistry      Component Value Date/Time   NA 138 01/05/2021 1159   NA 144 01/27/2017 1302   NA 138 09/16/2016 1105   K 4.5 01/05/2021 1159   K 4.3 01/27/2017 1302   K 4.2 09/16/2016 1105   CL 96 (L) 01/05/2021 1159   CL 102 01/27/2017 1302   CO2 33 (H) 01/05/2021 1159   CO2 27 01/27/2017 1302   CO2 26 09/16/2016 1105   BUN 19 01/05/2021 1159   BUN 14 01/27/2017 1302   BUN 11.9 09/16/2016 1105   CREATININE 1.35 (H) 01/05/2021 1159   CREATININE 1.1 01/27/2017 1302   CREATININE 0.8 09/16/2016 1105      Component Value Date/Time   CALCIUM 10.0 01/05/2021 1159   CALCIUM 9.5 01/27/2017 1302   CALCIUM 9.0 09/16/2016 1105   ALKPHOS 108 01/05/2021 1159   ALKPHOS 116 (H) 01/27/2017 1302   ALKPHOS 136 09/16/2016 1105   AST 25 01/05/2021 1159   AST 8 09/16/2016 1105   ALT 24 01/05/2021 1159   ALT 25 01/27/2017 1302   ALT 12 09/16/2016 1105   BILITOT 0.4 01/05/2021 1159   BILITOT 0.32 09/16/2016 1105       Impression and Plan: Ms. Fate is a very pleasant 50 yo with chronic phase CML.  We have had a miserable time try to treat this because of side effects with no matter what  TKI medication that we use.  She has diarrhea.  We will try low-dose Scemblix and see how she tolerates this.  Again, she has multiple health issues.  She has diabetes.  She has had her last hemoglobin A1c was 8.5.  We will have to see about making referral for Gastroenterology.  Para she says she sees her family right after Christmas.  I just feel bad for her.  I know she is trying hard.  Her poor body just has its issues and this certainly is making it challenging for [place].    [name], MD 12/19/202212:32 PM

## 2021-02-17 ENCOUNTER — Telehealth: Payer: Self-pay

## 2021-02-17 ENCOUNTER — Telehealth: Payer: Self-pay | Admitting: Hematology & Oncology

## 2021-02-17 NOTE — Telephone Encounter (Signed)
Attempted to contact patient to advise about appts that were scheduled, per los 12/19. A letter will be sent out

## 2021-02-17 NOTE — Telephone Encounter (Signed)
Advised via MyChart.

## 2021-02-17 NOTE — Telephone Encounter (Signed)
-----   Message from [name], MD sent at 02/17/2021  7:11 AM EST ----- Call --the diabetes is not good!!  The Hemoglobin A1c is 11.9.  You will have a very difficult time with treating the CML if your blood sugars do not get better!!   Tabitha Estrada

## 2021-02-20 LAB — BCR/ABL

## 2021-02-24 ENCOUNTER — Telehealth: Payer: Self-pay

## 2021-02-24 ENCOUNTER — Encounter: Payer: Self-pay | Admitting: Hematology & Oncology

## 2021-02-24 ENCOUNTER — Other Ambulatory Visit (HOSPITAL_COMMUNITY): Payer: Self-pay

## 2021-02-24 DIAGNOSIS — C9210 Chronic myeloid leukemia, BCR/ABL-positive, not having achieved remission: Secondary | ICD-10-CM

## 2021-02-24 MED ORDER — SCEMBLIX 20 MG PO TABS
20.0000 mg | ORAL_TABLET | Freq: Every day | ORAL | 4 refills | Status: DC
  Filled 2021-02-24: qty 30, fill #0
  Filled 2021-02-25: qty 30, 30d supply, fill #0

## 2021-02-24 NOTE — Telephone Encounter (Signed)
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  We really need to try the Scemblix at a low-dose of 20 mg a day.  If she starts having diarrhea please let us know.  Please try to make sure her blood sugars are under better control.  Laurey Arrow

## 2021-02-24 NOTE — Telephone Encounter (Signed)
Called and informed patient of lab results, patient verbalized understanding and denies any questions or concerns at this time. Pt has not received Scemblix from [facility] yet due to being on back order. Pt to f/u with pharmacy today.

## 2021-02-25 ENCOUNTER — Other Ambulatory Visit (HOSPITAL_COMMUNITY): Payer: Self-pay

## 2021-03-20 ENCOUNTER — Other Ambulatory Visit: Payer: Self-pay

## 2021-03-20 ENCOUNTER — Ambulatory Visit (INDEPENDENT_AMBULATORY_CARE_PROVIDER_SITE_OTHER): Payer: Medicaid Other | Admitting: Gastroenterology

## 2021-03-20 ENCOUNTER — Encounter: Payer: Self-pay | Admitting: Gastroenterology

## 2021-03-20 VITALS — BP 118/72 | HR 79 | Ht 64.0 in | Wt 262.5 lb

## 2021-03-20 DIAGNOSIS — K219 Gastro-esophageal reflux disease without esophagitis: Secondary | ICD-10-CM

## 2021-03-20 DIAGNOSIS — Z9989 Dependence on other enabling machines and devices: Secondary | ICD-10-CM

## 2021-03-20 DIAGNOSIS — K529 Noninfective gastroenteritis and colitis, unspecified: Secondary | ICD-10-CM | POA: Diagnosis not present

## 2021-03-20 DIAGNOSIS — C9210 Chronic myeloid leukemia, BCR/ABL-positive, not having achieved remission: Secondary | ICD-10-CM

## 2021-03-20 DIAGNOSIS — K2270 Barrett's esophagus without dysplasia: Secondary | ICD-10-CM

## 2021-03-20 DIAGNOSIS — G4733 Obstructive sleep apnea (adult) (pediatric): Secondary | ICD-10-CM

## 2021-03-20 DIAGNOSIS — K580 Irritable bowel syndrome with diarrhea: Secondary | ICD-10-CM

## 2021-03-20 DIAGNOSIS — E1165 Type 2 diabetes mellitus with hyperglycemia: Secondary | ICD-10-CM

## 2021-03-20 DIAGNOSIS — J449 Chronic obstructive pulmonary disease, unspecified: Secondary | ICD-10-CM

## 2021-03-20 DIAGNOSIS — Z6841 Body Mass Index (BMI) 40.0 and over, adult: Secondary | ICD-10-CM

## 2021-03-20 DIAGNOSIS — Z794 Long term (current) use of insulin: Secondary | ICD-10-CM

## 2021-03-20 DIAGNOSIS — Z87891 Personal history of nicotine dependence: Secondary | ICD-10-CM

## 2021-03-20 DIAGNOSIS — I10 Essential (primary) hypertension: Secondary | ICD-10-CM

## 2021-03-20 MED ORDER — COLESTIPOL HCL 1 G PO TABS: 1.0000 g | ORAL_TABLET | Freq: Every morning | ORAL | 5 refills | Status: AC

## 2021-03-20 MED ORDER — RABEPRAZOLE SODIUM 20 MG PO TBEC: 20.0000 mg | DELAYED_RELEASE_TABLET | Freq: Two times a day (BID) | ORAL | 5 refills | Status: DC

## 2021-03-20 NOTE — Patient Instructions (Signed)
If you are age 50 or older, your body mass index should be between 23-30. Your Body mass index is 45.06 kg/m. If this is out of the aforementioned range listed, please consider follow up with your Primary Care Provider.  If you are age 34 or younger, your body mass index should be between 19-25. Your Body mass index is 45.06 kg/m. If this is out of the aformentioned range listed, please consider follow up with your Primary Care Provider.   __________________________________________________________  The [facility] providers would like to encourage you to use [facility] to communicate with providers for non-urgent requests or questions.  Due to long hold times on the telephone, sending your provider a message by [facility] may be a faster and more efficient way to get a response.  Please allow 48 business hours for a response.  Please remember that this is for non-urgent requests.    We have sent the following medications to your pharmacy for you to pick up at your convenience:  Colestipol 1 gram daily in the morning Aciphex 20mg  twice daily  Please stop your prilosec when you start the Aciphex.  Return to the clinic in 6 months or sooner if needed.  Thank you for choosing me and [facility].  Vito Cirigliano, D.O.

## 2021-03-20 NOTE — Progress Notes (Signed)
Chief Complaint: GERD, Barrett's Esophagus, dysphagia  Referring Provider: Burney Gauze, MD      HPI:     Tabitha Estrada is a 51 y.o. 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  She reports a longstanding history of GERD.  Index symptoms of heartburn.  History of intermittent dysphagia, which improved after empiric dilation in 11/2018.  Currently treated with omeprazole 40 mg/day.  Reports a prior history of Barrett's Esophagus.  Was last seen at [facility] in 08/2020.  Prior to that followed with Dr. Arlana Pouch at [facility].  At that time, had recommended Colestid 1 g with breakfast, Lomotil, fiber supplement, Levsin prn.  Also recommended increasing PPI to bid to see if that would improve dysphagia symptoms.  Recommended repeat EGD in 11/2021 for BE surveillance or sooner prn.  Separately, longstanding history of diarrhea since cholecystectomy in the early 1990s.  Baseline 5 BM/day, typically liquid.  Has had a few episodes of fecal incontinence.  Rare nocturnal symptoms.  No hematochezia or melena.  Does have bilateral lower quadrant abdominal pain, exacerbated by defecation.  Had trialed cholestyramine in 2022, but stopped after a few weeks due to inability to balance cholestyramine and her many other medications timing wise.  Have prescribed Levsin in 2022, but not sure if she ever took this.  Never trialed fiber supplement.  Continues to smoke 1 PPD.  Chronic anemia with baseline Hgb 11-12.  Endoscopic history: - Colonoscopy (04/2016): Diverticulosis, internal  hemorrhoids - EGD (04/2018) 3 cm segment of nondysplastic Barrett's Esophagus.  Empiric dilation with 56 French Maloney dilator - EGD (11/2018): Nondysplastic Barrett's Esophagus.  Empirically dilated with 56 French Maloney dilator - Colonoscopy (11/2018): Normal.  Biopsies negative for [facility]  -CT enterography (12/2018): Normal - Abdominal ultrasound (04/2018): Increase hepatic echogenicity consistent with fatty infiltration. ccy.  States her reflux currently well controlled with Prilosec daily. No regurgitation. Occasional dysphagia, but overall unchanged from prior and has had empiric dilation x2. Points to anterior neck.   Taking Plavix 75 mg/day  Does still have longstanding history of loose, nonbloody stools.  States symptoms started after her cholecystectomy.  Stopped Creon (not efficacious).  Stopped hyoscyamine. Takes Lomotil prn.  Takes Metamucil daily.   Past Medical History:  Diagnosis Date   Allergy    Anemia    Anxiety    Arthritis    Asthma    CML (chronic myelocytic leukemia) ([place]) 05/31/2018   COPD (chronic obstructive pulmonary disease) ([place])    Depression    Diabetes mellitus without complication (HCC)    Dyspnea    with exertion and bronchititis   Fibromyalgia    GERD (gastroesophageal reflux disease)    History of hiatal hernia    History of kidney stones    Hyperlipidemia    Neuromuscular disorder ([place])    Pneumonia    hs, 01-19-15   Sleep apnea    has Cpap have not been using     Past Surgical History:  Procedure Laterality Date   BACK SURGERY  2015,2016, 2018   CARPAL TUNNEL RELEASE Right 2008   [place]  2015   TONSILLECTOMY  1979   TUBAL LIGATION  2005   Family History  Problem Relation Age of Onset   Diabetes Father    Hyperlipidemia Father    Hypertension Father    Diabetes Paternal Aunt    Hypertension Paternal Aunt    Heart attack Paternal Uncle    Emphysema Maternal Grandmother    Heart failure  Maternal Grandmother    Cancer Maternal Grandfather    Colon cancer Neg Hx    Social History   Tobacco Use   Smoking status: Every Day    Packs/day: 1.00    Years: 35.00    Pack years: 35.00    Types: Cigarettes   Smokeless tobacco: Never   Tobacco comments:    Wellbutrin started 07/25/2019  Vaping Use   Vaping Use: Never used  Substance Use Topics   Alcohol use: No   Drug use: No   Current Outpatient Medications  Medication Sig Dispense Refill   albuterol (PROVENTIL HFA;VENTOLIN HFA) 108 (90 Base) MCG/ACT inhaler Inhale 1-2 puffs into the lungs every 4 (four) hours as needed for wheezing or shortness of breath. 1 Inhaler 12   albuterol (PROVENTIL) (2.5 MG/3ML) 0.083% nebulizer solution Inhale the contents of one vial in nebulizer every four to six hours as needed for cough or wheeze. 180 mL 1   aluminum-magnesium hydroxide-simethicone (MAALOX) 096-283-66 MG/5ML SUSP Take by mouth daily as needed.      amLODipine (NORVASC) 10 MG tablet Take 10 mg by mouth daily.     Asciminib HCl (SCEMBLIX) 20 MG TABS Take 20 mg by mouth daily after breakfast. 30 tablet 4   ASPIRIN LOW DOSE 81 MG EC tablet Take 81 mg by mouth daily.     atorvastatin (LIPITOR) 80 MG tablet TAKE 1 TABLET (80 MG TOTAL) BY MOUTH DAILY. NEEDS LAB WORK 15 tablet 0   budesonide-formoterol (SYMBICORT) 160-4.5 MCG/ACT inhaler Inhale 2 puffs into the lungs 2 (two) times daily. 1 Inhaler 11   clobetasol ointment (TEMOVATE) 2.94 % Apply 1 application topically 2 (two) times daily. 60 g 0   colestipol (COLESTID) 1 g tablet Take 1 tablet (1 g total) by mouth every morning. 30 tablet 5   Continuous Blood Gluc Sensor (DEXCOM G6 SENSOR) MISC Apply topically.     cyclobenzaprine (FLEXERIL) 10 MG tablet Take 10 mg by mouth daily as needed.      diclofenac Sodium (VOLTAREN) 1 % GEL SMARTSIG:Gram(s) Topical Twice Daily     diphenoxylate-atropine (LOMOTIL) 2.5-0.025 MG tablet Take 1 tablet by mouth 4 (four) times daily as needed for  diarrhea or loose stools (as needed after taking Sprycel for diarrhea). 60 tablet 1   ergocalciferol (VITAMIN D2) 1.25 MG (50000 UT) capsule Take 1 capsule by mouth once a week.     fluticasone (FLONASE) 50 MCG/ACT nasal spray Use two sprays in each nostril once daily as directed. 16 g 5   folic acid (FOLVITE) 1 MG tablet Take 1 mg by mouth daily.     insulin aspart (NOVOLOG FLEXPEN) 100 UNIT/ML FlexPen Inject 40 Units into the skin daily with supper. (Patient taking differently: Inject into the skin daily with supper. Sliding scale 10-40 units) 15 mL 6   insulin glargine, 1 Unit Dial, (TOUJEO) 300 UNIT/ML Solostar Pen Inject 70 Units into the skin daily.     ketoconazole (NIZORAL) 2 % cream Apply topically 2 (two) times a day.     Magnesium Chloride 64 MG TBEC Take 64 mg by mouth daily.  meloxicam (MOBIC) 7.5 MG tablet Take 7.5 mg by mouth daily.     montelukast (SINGULAIR) 10 MG tablet TAKE 1 TABLET (10 MG TOTAL) BY MOUTH DAILY 90 tablet 0   OXYGEN Inhale into the lungs. Pt reports 2-3 lpm continuous.     potassium chloride SA (KLOR-CON) 20 MEQ tablet Take 1 tablet (20 mEq total) by mouth daily. 30 tablet 4   pregabalin (LYRICA) 200 MG capsule Take 1 capsule by mouth 2 (two) times daily.     RABEprazole (ACIPHEX) 20 MG tablet Take 1 tablet (20 mg total) by mouth 2 (two) times daily. 60 tablet 5   valACYclovir (VALTREX) 500 MG tablet TAKE ONE TABLET BY MOUTH AT BEDTIME 90 tablet 1   valsartan (DIOVAN) 40 MG tablet Take 40 mg by mouth daily.     nicotine (NICODERM CQ - DOSED IN MG/24 HOURS) 21 mg/24hr patch 21 mg daily. (Patient not taking: Reported on 05/01/2020)     tiZANidine (ZANAFLEX) 4 MG tablet Take by mouth daily as needed.  (Patient not taking: Reported on 05/01/2020)     torsemide (DEMADEX) 20 MG tablet Take 1 tablet by mouth daily.     Current Facility-Administered Medications  Medication Dose Route Frequency Provider Last Rate Last Admin   ipratropium-albuterol (DUONEB) 0.5-2.5 (3)  MG/3ML nebulizer solution 3 mL  3 mL Nebulization Q6H Emeterio Reeve, DO       Allergies  Allergen Reactions   Dulaglutide Nausea And Vomiting and Other (See Comments)   Elemental Sulfur Nausea And Vomiting   Liraglutide Nausea And Vomiting   Metformin Diarrhea   Sulfamethoxazole Nausea And Vomiting     Review of Systems: All systems reviewed and negative except where noted in HPI.     Physical Exam:    Wt Readings from Last 3 Encounters:  03/20/21 262 lb 8 oz (119.1 kg)  02/16/21 264 lb (119.7 kg)  01/05/21 258 lb (117 kg)    BP 118/72 (BP Location: Right Arm, Patient Position: Sitting, Cuff Size: Large)    Pulse 79    Ht 5\' 4"  (1.626 m)    Wt 262 lb 8 oz (119.1 kg)    LMP 11/17/2016 (Approximate)    SpO2 92% Comment: not on oxygen   BMI 45.06 kg/m  Constitutional:  Pleasant, in no acute distress. Psychiatric: Normal mood and affect. Behavior is normal. EENT: Pupils normal.  Conjunctivae are normal. No scleral icterus. Neck supple. No cervical LAD. Cardiovascular: Normal rate, regular rhythm. No edema Pulmonary/chest: Effort normal and breath sounds normal. No wheezing, rales or rhonchi. Abdominal: Soft, nondistended, nontender. Bowel sounds active throughout. There are no masses palpable. No hepatomegaly. Neurological: Alert and oriented to person place and time. Skin: Skin is warm and dry. No rashes noted.   ASSESSMENT AND PLAN;   1) GERD -Change from Prilosec to Aciphex 20 mg PO BID if able to get insurance approval - Continue antireflux lifestyle/dietary modifications - Obtain endo and path reports from [facility]  2) Barrett's Esophagus - Per review of prior notes, reported history of 3 cm segment of nondysplastic Barrett's Esophagus diagnosed in 2020.  Unable to see endoscopy report and pathology reports - As above, will try to obtain endoscopy and pathology reports from Dr. Arlana Pouch - Continue PPI as above - Depending on prior endoscopy reports, will discuss  optimal timing for repeat upper endoscopy  3) Chronic diarrhea - Trial course of Colestid - Continue fiber - Continue adequate hydration - Discussed possibility of polypharmacy and CML medications also  playing a role in addition to suspected bile salt diarrhea and/or IBS-D - Not a candidate for Viberzi  4) Morbid obesity 5) COPD prescribed home O2 7) Hypertension 8) OSA 9) History of CVA in 04/2020 10) Chronic antiplatelet therapy 11) CML - If/when scheduling, endoscopic procedures will need to be scheduled at [facility] due to elevated periprocedural risks   RTC in 6 months or sooner prn    Malynn Lucy V Elidia Bonenfant, DO, FACG  03/20/2021, 4:04 PM   No ref. provider found

## 2021-03-26 ENCOUNTER — Telehealth: Payer: Self-pay | Admitting: General Surgery

## 2021-03-26 NOTE — Telephone Encounter (Signed)
Patients insurance denied Rabeprazole sodium. Patient has not tried 2 of the other medications on preferred drug list. She has been on only one Omeprazole. Notified the patient of this and explained she will have to pay out of pocket. Left the patient a voicemail to contact the office. Dr Bryan Lemma stated to change the Prilosec to aciphex if able to get ins approval.

## 2021-03-31 ENCOUNTER — Other Ambulatory Visit (HOSPITAL_COMMUNITY): Payer: Self-pay

## 2021-04-02 ENCOUNTER — Inpatient Hospital Stay: Payer: Medicaid Other | Attending: Hematology & Oncology

## 2021-04-02 ENCOUNTER — Inpatient Hospital Stay (HOSPITAL_BASED_OUTPATIENT_CLINIC_OR_DEPARTMENT_OTHER): Payer: Medicaid Other | Admitting: Hematology & Oncology

## 2021-04-02 ENCOUNTER — Other Ambulatory Visit: Payer: Self-pay

## 2021-04-02 ENCOUNTER — Other Ambulatory Visit: Payer: Self-pay | Admitting: *Deleted

## 2021-04-02 ENCOUNTER — Encounter: Payer: Self-pay | Admitting: Hematology & Oncology

## 2021-04-02 VITALS — BP 120/66 | HR 78 | Temp 98.2°F | Resp 18 | Ht 64.0 in | Wt 259.0 lb

## 2021-04-02 DIAGNOSIS — C9210 Chronic myeloid leukemia, BCR/ABL-positive, not having achieved remission: Secondary | ICD-10-CM

## 2021-04-02 DIAGNOSIS — R197 Diarrhea, unspecified: Secondary | ICD-10-CM | POA: Insufficient documentation

## 2021-04-02 DIAGNOSIS — C9310 Chronic myelomonocytic leukemia not having achieved remission: Secondary | ICD-10-CM | POA: Insufficient documentation

## 2021-04-02 DIAGNOSIS — E119 Type 2 diabetes mellitus without complications: Secondary | ICD-10-CM | POA: Diagnosis not present

## 2021-04-02 DIAGNOSIS — D509 Iron deficiency anemia, unspecified: Secondary | ICD-10-CM | POA: Insufficient documentation

## 2021-04-02 DIAGNOSIS — D75839 Thrombocytosis, unspecified: Secondary | ICD-10-CM | POA: Insufficient documentation

## 2021-04-02 LAB — CBC WITH DIFFERENTIAL (CANCER CENTER ONLY)
Abs Immature Granulocytes: 5.41 10*3/uL — ABNORMAL HIGH (ref 0.00–0.07)
Basophils Absolute: 1.2 10*3/uL — ABNORMAL HIGH (ref 0.0–0.1)
Basophils Relative: 5 %
Eosinophils Absolute: 0 10*3/uL (ref 0.0–0.5)
Eosinophils Relative: 0 %
HCT: 39.6 % (ref 36.0–46.0)
Hemoglobin: 12.6 g/dL (ref 12.0–15.0)
Immature Granulocytes: 22 %
Lymphocytes Relative: 13 %
Lymphs Abs: 3 10*3/uL (ref 0.7–4.0)
MCH: 30.4 pg (ref 26.0–34.0)
MCHC: 31.8 g/dL (ref 30.0–36.0)
MCV: 95.7 fL (ref 80.0–100.0)
Monocytes Absolute: 0.4 10*3/uL (ref 0.1–1.0)
Monocytes Relative: 2 %
Neutro Abs: 14.2 10*3/uL — ABNORMAL HIGH (ref 1.7–7.7)
Neutrophils Relative %: 58 %
Platelet Count: 680 10*3/uL — ABNORMAL HIGH (ref 150–400)
RBC: 4.14 MIL/uL (ref 3.87–5.11)
RDW: 16 % — ABNORMAL HIGH (ref 11.5–15.5)
Smear Review: NORMAL
WBC Count: 24.3 10*3/uL — ABNORMAL HIGH (ref 4.0–10.5)
nRBC: 0.2 % (ref 0.0–0.2)

## 2021-04-02 LAB — CMP (CANCER CENTER ONLY)
ALT: 7 U/L (ref 0–44)
AST: 8 U/L — ABNORMAL LOW (ref 15–41)
Albumin: 3.9 g/dL (ref 3.5–5.0)
Alkaline Phosphatase: 106 U/L (ref 38–126)
Anion gap: 9 (ref 5–15)
BUN: 13 mg/dL (ref 6–20)
CO2: 36 mmol/L — ABNORMAL HIGH (ref 22–32)
Calcium: 8.8 mg/dL — ABNORMAL LOW (ref 8.9–10.3)
Chloride: 94 mmol/L — ABNORMAL LOW (ref 98–111)
Creatinine: 1.19 mg/dL — ABNORMAL HIGH (ref 0.44–1.00)
GFR, Estimated: 56 mL/min — ABNORMAL LOW (ref >60)
Glucose, Bld: 316 mg/dL — ABNORMAL HIGH (ref 70–99)
Potassium: 3.7 mmol/L (ref 3.5–5.1)
Sodium: 139 mmol/L (ref 135–145)
Total Bilirubin: 0.6 mg/dL (ref 0.3–1.2)
Total Protein: 6.4 g/dL — ABNORMAL LOW (ref 6.5–8.1)

## 2021-04-02 LAB — SAVE SMEAR(SSMR), FOR PROVIDER SLIDE REVIEW

## 2021-04-02 LAB — LACTATE DEHYDROGENASE: LDH: 350 U/L — ABNORMAL HIGH (ref 98–192)

## 2021-04-02 MED ORDER — CEFDINIR 300 MG PO CAPS: 600.0000 mg | ORAL_CAPSULE | Freq: Every day | ORAL | 0 refills | Status: DC

## 2021-04-02 MED ORDER — HYDROXYUREA 500 MG PO CAPS: 500.0000 mg | ORAL_CAPSULE | Freq: Two times a day (BID) | ORAL | 3 refills | Status: DC

## 2021-04-02 NOTE — Progress Notes (Signed)
Hematology and Oncology Follow Up Visit  Tabitha Estrada [id number] 1970/03/15 51 y.o. 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  Unfortunately, she cannot tolerate the Scemblix.  She still had diarrhea.  I think it is apparent now that she is not tolerant of any of the TKI drugs.  We really are not a tough way right now.  Her BCR/ABL is up to 81%.  She clearly has an active marrow secondary to CML.  I think were going to have to get her to [facility] for a consultation to see what they may be able to offer her.  Again, she is not that old.  I realize that an allogeneic transplant would be incredibly difficult for her given her health issues.  I think were going to have to get her on Hydrea just to try to get her blood counts under better control.  I know she has a sinus issue right now.  She says she does not feel all that good.  She has a lot of congestion.  She is coughing up some purulent type mucus.  She is to have a appoint with her ENT in a week or so.  I will call in [place] (600 mg p.o. daily x10 days) to try to help.  She has diabetes.  She has her blood sugars quite high.  Again, this is a very tough problem.  We have tried all of the TKI drugs.  No matter what we have tried, she has side effects mostly with diarrhea.  Again, the other options would be interferon or homoharringtonine.  Both of these are injectable.  She has  had no bleeding.  She does have leg swelling.  I am sure this is probably from her overall health issues.  Currently, I would say performance status is probably ECOG 1.    Medications:  Allergies as of 04/02/2021       Reactions   Dulaglutide Nausea And Vomiting, Other (See Comments)   Elemental Sulfur Nausea And Vomiting   Liraglutide Nausea And Vomiting   Metformin Diarrhea   Sulfamethoxazole Nausea And Vomiting        Medication List        Accurate as of April 02, 2021 11:47 AM. If you have any questions, ask your nurse or doctor.          albuterol 108 (90 Base) MCG/ACT inhaler Commonly known as: VENTOLIN HFA Inhale 1-2 puffs into the lungs every 4 (four) hours as needed for wheezing or shortness of breath.   albuterol (2.5 MG/3ML) 0.083% nebulizer solution Commonly known as: PROVENTIL Inhale the contents of one vial in nebulizer every four to six hours as needed for cough or wheeze.   aluminum-magnesium hydroxide-simethicone 867-619-50 MG/5ML Susp Commonly known as: MAALOX Take by mouth daily as needed.  amLODipine 10 MG tablet Commonly known as: NORVASC Take 10 mg by mouth daily.   Aspirin Low Dose 81 MG EC tablet Generic drug: aspirin Take 81 mg by mouth daily.   atorvastatin 80 MG tablet Commonly known as: LIPITOR TAKE 1 TABLET (80 MG TOTAL) BY MOUTH DAILY. NEEDS LAB WORK   budesonide-formoterol 160-4.5 MCG/ACT inhaler Commonly known as: SYMBICORT Inhale 2 puffs into the lungs 2 (two) times daily.   clobetasol ointment 0.05 % Commonly known as: TEMOVATE Apply 1 application topically 2 (two) times daily.   colestipol 1 g tablet Commonly known as: COLESTID Take 1 tablet (1 g total) by mouth every morning.   cyclobenzaprine 10 MG tablet Commonly known as: FLEXERIL Take 10 mg by mouth daily as needed.   Dexcom G6 Sensor Misc Apply topically.   diclofenac Sodium 1 % Gel Commonly known as: VOLTAREN SMARTSIG:Gram(s) Topical Twice Daily    diphenoxylate-atropine 2.5-0.025 MG tablet Commonly known as: LOMOTIL Take 1 tablet by mouth 4 (four) times daily as needed for diarrhea or loose stools (as needed after taking Sprycel for diarrhea).   ergocalciferol 1.25 MG (50000 UT) capsule Commonly known as: VITAMIN D2 Take 1 capsule by mouth once a week.   fluticasone 50 MCG/ACT nasal spray Commonly known as: FLONASE Use two sprays in each nostril once daily as directed.   folic acid 1 MG tablet Commonly known as: FOLVITE Take 1 mg by mouth daily.   insulin aspart 100 UNIT/ML FlexPen Commonly known as: NovoLOG FlexPen Inject 40 Units into the skin daily with supper. What changed:  how much to take additional instructions   insulin glargine (1 Unit Dial) 300 UNIT/ML Solostar Pen Commonly known as: TOUJEO Inject 70 Units into the skin daily.   ketoconazole 2 % cream Commonly known as: NIZORAL Apply topically 2 (two) times a day.   Magnesium Chloride 64 MG Tbec Take 64 mg by mouth daily.   meloxicam 7.5 MG tablet Commonly known as: MOBIC Take 7.5 mg by mouth daily.   montelukast 10 MG tablet Commonly known as: SINGULAIR TAKE 1 TABLET (10 MG TOTAL) BY MOUTH DAILY   nicotine 21 mg/24hr patch Commonly known as: NICODERM CQ - dosed in mg/24 hours 21 mg daily.   OXYGEN Inhale into the lungs. Pt reports 2-3 lpm continuous.   potassium chloride SA 20 MEQ tablet Commonly known as: KLOR-CON M Take 1 tablet (20 mEq total) by mouth daily.   pregabalin 200 MG capsule Commonly known as: LYRICA Take 1 capsule by mouth 2 (two) times daily.   RABEprazole 20 MG tablet Commonly known as: ACIPHEX Take 1 tablet (20 mg total) by mouth 2 (two) times daily.   Scemblix 20 MG Tabs Generic drug: Asciminib HCl Take 20 mg by mouth daily after breakfast.   tiZANidine 4 MG tablet Commonly known as: ZANAFLEX Take by mouth daily as needed.   torsemide 20 MG tablet Commonly known as: DEMADEX Take 1 tablet by mouth daily.    valACYclovir 500 MG tablet Commonly known as: VALTREX TAKE ONE TABLET BY MOUTH AT BEDTIME   valsartan 40 MG tablet Commonly known as: DIOVAN Take 40 mg by mouth daily.        Allergies:  Allergies  Allergen Reactions   Dulaglutide Nausea And Vomiting and Other (See Comments)   Elemental Sulfur Nausea And Vomiting   Liraglutide Nausea And Vomiting   Metformin Diarrhea   Sulfamethoxazole Nausea And Vomiting    Past Medical History, Surgical history, Social history, and Family History  were reviewed and updated.  Review of Systems: Review of Systems  Constitutional: Negative.   Eyes: Negative.   Respiratory:  Positive for cough.   Cardiovascular:  Positive for palpitations.  Gastrointestinal:  Positive for diarrhea.  Genitourinary: Negative.   Musculoskeletal:  Positive for back pain and neck pain.  Skin:  Positive for rash.  Neurological:  Positive for headaches.  Endo/Heme/Allergies: Negative.   Psychiatric/Behavioral: Negative.      Physical Exam:  height is 5\' 4"  (1.626 m) and weight is 259 lb (117.5 kg). Her oral temperature is 98.2 F (36.8 C). Her blood pressure is 120/66 and her pulse is 78. Her respiration is 18 and oxygen saturation is 100%.   Wt Readings from Last 3 Encounters:  04/02/21 259 lb (117.5 kg)  03/20/21 262 lb 8 oz (119.1 kg)  02/16/21 264 lb (119.7 kg)  Her vital signs are temperature of 97.9.  Pulse 81.  Blood pressure 118/58.  Weight is 269 pounds.  Physical Exam Vitals reviewed.  HENT:     Head: Normocephalic and atraumatic.  Eyes:     Pupils: Pupils are equal, round, and reactive to light.  Cardiovascular:     Rate and Rhythm: Normal rate and regular rhythm.     Heart sounds: Normal heart sounds.  Pulmonary:     Effort: Pulmonary effort is normal.     Breath sounds: Normal breath sounds.  Abdominal:     General: Bowel sounds are normal.     Palpations: Abdomen is soft.  Musculoskeletal:        General: No tenderness or  deformity. Normal range of motion.     Cervical back: Normal range of motion.  Lymphadenopathy:     Cervical: No cervical adenopathy.  Skin:    General: Skin is warm and dry.     Findings: No erythema or rash.  Neurological:     Mental Status: She is alert and oriented to person, place, and time.  Psychiatric:        Behavior: Behavior normal.        Thought Content: Thought content normal.        Judgment: Judgment normal.     Lab Results  Component Value Date   WBC 15.0 (H) 02/16/2021   HGB 11.4 (L) 02/16/2021   HCT 35.4 (L) 02/16/2021   MCV 98.3 02/16/2021   PLT 293 02/16/2021   Lab Results  Component Value Date   FERRITIN 352 (H) 11/20/2020   IRON 46 11/20/2020   TIBC 223 (L) 11/20/2020   UIBC 177 11/20/2020   IRONPCTSAT 21 11/20/2020   Lab Results  Component Value Date   RETICCTPCT 1.5 08/29/2020   RBC 3.60 (L) 02/16/2021   No results found for: KPAFRELGTCHN, LAMBDASER, KAPLAMBRATIO Lab Results  Component Value Date   IGGSERUM 675 (L) 11/10/2016   IGMSERUM 86 11/10/2016   No results found for: Odetta Pink, SPEI   Chemistry      Component Value Date/Time   NA 144 02/16/2021 1152   NA 144 01/27/2017 1302   NA 138 09/16/2016 1105   K 4.2 02/16/2021 1152   K 4.3 01/27/2017 1302   K 4.2 09/16/2016 1105   CL 102 02/16/2021 1152   CL 102 01/27/2017 1302   CO2 35 (H) 02/16/2021 1152   CO2 27 01/27/2017 1302   CO2 26 09/16/2016 1105   BUN 12 02/16/2021 1152   BUN 14 01/27/2017 1302   BUN 11.9 09/16/2016 1105  CREATININE 1.27 (H) 02/16/2021 1152   CREATININE 1.1 01/27/2017 1302   CREATININE 0.8 09/16/2016 1105      Component Value Date/Time   CALCIUM 9.0 02/16/2021 1152   CALCIUM 9.5 01/27/2017 1302   CALCIUM 9.0 09/16/2016 1105   ALKPHOS 91 02/16/2021 1152   ALKPHOS 116 (H) 01/27/2017 1302   ALKPHOS 136 09/16/2016 1105   AST 11 (L) 02/16/2021 1152   AST 8 09/16/2016 1105   ALT 13 02/16/2021  1152   ALT 25 01/27/2017 1302   ALT 12 09/16/2016 1105   BILITOT 0.4 02/16/2021 1152   BILITOT 0.32 09/16/2016 1105       Impression and Plan: Ms. Odonnel is a very pleasant 51 yo with chronic phase CML.  We have had a miserable time try to treat this because of side effects with no matter what  TKI medication that we use.  She has diarrhea.  Again, we will try her on Hydrea.  We will see if this might be able to get her blood counts under better control.  By her blood smear, she clearly has a very active bone marrow.  I do see some immature myeloid cells.  I do not see any obvious blasts.  Will be interesting to see what the BCR/ABL is.  I am sure will be much higher.  I worry that the platelet count is quite high right now.  I realize some of the leukocytosis and thrombocytosis might be from infection.  However, given the appearance of the blood smear I do worry that the CML is beginning to try and transformed to a more accelerated phase.  I suppose she may need to have a bone marrow test done to see exactly where her bone marrow stands.  Again, we will see about referring her out to [facility] and have one of the leukemia specialist take a look at her out there.  I would probably will plan to get her back to see [place] in a month.  It would not surprise me if we have to see her back sooner if she has any issues with the Hydrea.  We will try low-dose Scemblix and see how she tolerates this.  Again, she has multiple health issues.  She has diabetes.  She has had her last hemoglobin A1c was 8.5.  We will have to see about making referral for Gastroenterology.  Para she says she sees her family right after Christmas.  I just feel bad for her.  I know she is trying hard.  Her poor body just has its issues and this certainly is making it challenging for [place].    [name], MD 2/2/202311:47 AM

## 2021-04-07 ENCOUNTER — Encounter: Payer: Self-pay | Admitting: Hematology & Oncology

## 2021-04-08 LAB — BCR/ABL

## 2021-04-08 LAB — TKI RESISTANCE BY PCR

## 2021-04-28 ENCOUNTER — Encounter: Payer: Self-pay | Admitting: Hematology & Oncology

## 2021-04-29 ENCOUNTER — Other Ambulatory Visit (HOSPITAL_COMMUNITY): Payer: Self-pay

## 2021-04-29 ENCOUNTER — Other Ambulatory Visit: Payer: Self-pay | Admitting: Hematology & Oncology

## 2021-04-29 MED ORDER — PONATINIB HCL 15 MG PO TABS
15.0000 mg | ORAL_TABLET | Freq: Every day | ORAL | 4 refills | Status: DC
  Filled 2021-04-29: qty 30, 30d supply, fill #0

## 2021-04-30 ENCOUNTER — Telehealth: Payer: Self-pay | Admitting: Pharmacist

## 2021-04-30 ENCOUNTER — Other Ambulatory Visit (HOSPITAL_COMMUNITY): Payer: Self-pay

## 2021-04-30 ENCOUNTER — Telehealth: Payer: Self-pay | Admitting: Pharmacy Technician

## 2021-04-30 ENCOUNTER — Encounter: Payer: Self-pay | Admitting: Family

## 2021-04-30 DIAGNOSIS — C9210 Chronic myeloid leukemia, BCR/ABL-positive, not having achieved remission: Secondary | ICD-10-CM

## 2021-04-30 MED ORDER — PONATINIB HCL 15 MG PO TABS: 15.0000 mg | ORAL_TABLET | Freq: Every day | ORAL | 4 refills | Status: DC

## 2021-04-30 NOTE — Telephone Encounter (Signed)
Oral Oncology Pharmacist Encounter ? 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 Note previous therapies include Gleevec, Sprycel, Bosulif, Tasigna, and Scemblix - all discontinued due to intolerable side effects.  ? ?CBC w/ Diff and CMP from 04/17/21 assessed, WBC 25.7 K/uL secondary to disease. Pltc 679 K/uL No renal dose adjustments required. LFTs stable.LDH 339 U/L Prescription dose and frequency assessed for appropriateness. Patient starting on dose reduction due to previous history of intolerance to TKIs. ? ?Current medication list in Epic reviewed, no relevant/significant DDIs with Iclusig identified. ? ?Evaluated chart and no patient barriers to medication adherence noted.  ? ?Prescription has been e-scribed to the [facility] for benefits analysis and approval. ? ?Oral Oncology Clinic will continue to follow for insurance authorization, copayment issues, initial counseling and start date. ? ?Leron Croak, PharmD, BCPS ?Hematology/Oncology Clinical Pharmacist ?[facility] Oral Chemotherapy Navigation Clinics ?[phone] ?04/30/2021 8:31 AM ? ?

## 2021-04-30 NOTE — Telephone Encounter (Signed)
Oral Oncology Patient Advocate Encounter ? ?After completing a benefits investigation, prior authorization for Iclusig is not required at this time through [facility]. ? ?Patient's copay is $4.00.   ? ?Must use [facility] (designated pharmacy) to fill Iclusig. ? ?Dennison Nancy CPHT ?Specialty Pharmacy Patient Advocate ?[place] ?Phone [phone] ?Fax [phone] ?04/30/2021 9:27 AM ?  ? ? ?  ? ?

## 2021-05-06 ENCOUNTER — Other Ambulatory Visit (HOSPITAL_COMMUNITY): Payer: Self-pay

## 2021-05-06 ENCOUNTER — Inpatient Hospital Stay: Payer: Medicaid Other | Attending: Hematology & Oncology

## 2021-05-06 ENCOUNTER — Inpatient Hospital Stay (HOSPITAL_BASED_OUTPATIENT_CLINIC_OR_DEPARTMENT_OTHER): Payer: Medicaid Other | Admitting: Hematology & Oncology

## 2021-05-06 ENCOUNTER — Other Ambulatory Visit: Payer: Self-pay

## 2021-05-06 ENCOUNTER — Encounter: Payer: Self-pay | Admitting: Hematology & Oncology

## 2021-05-06 DIAGNOSIS — C9210 Chronic myeloid leukemia, BCR/ABL-positive, not having achieved remission: Secondary | ICD-10-CM

## 2021-05-06 DIAGNOSIS — D509 Iron deficiency anemia, unspecified: Secondary | ICD-10-CM | POA: Diagnosis present

## 2021-05-06 DIAGNOSIS — C9310 Chronic myelomonocytic leukemia not having achieved remission: Secondary | ICD-10-CM | POA: Insufficient documentation

## 2021-05-06 LAB — CBC WITH DIFFERENTIAL (CANCER CENTER ONLY)
Abs Immature Granulocytes: 11.91 10*3/uL — ABNORMAL HIGH (ref 0.00–0.07)
Basophils Absolute: 1.5 10*3/uL — ABNORMAL HIGH (ref 0.0–0.1)
Basophils Relative: 4 %
Eosinophils Absolute: 0.1 10*3/uL (ref 0.0–0.5)
Eosinophils Relative: 0 %
HCT: 40.3 % (ref 36.0–46.0)
Hemoglobin: 12.7 g/dL (ref 12.0–15.0)
Immature Granulocytes: 32 %
Lymphocytes Relative: 10 %
Lymphs Abs: 3.8 10*3/uL (ref 0.7–4.0)
MCH: 29.5 pg (ref 26.0–34.0)
MCHC: 31.5 g/dL (ref 30.0–36.0)
MCV: 93.7 fL (ref 80.0–100.0)
Monocytes Absolute: 0.4 10*3/uL (ref 0.1–1.0)
Monocytes Relative: 1 %
Neutro Abs: 19.1 10*3/uL — ABNORMAL HIGH (ref 1.7–7.7)
Neutrophils Relative %: 53 %
Platelet Count: 716 10*3/uL — ABNORMAL HIGH (ref 150–400)
RBC: 4.3 MIL/uL (ref 3.87–5.11)
RDW: 16.9 % — ABNORMAL HIGH (ref 11.5–15.5)
WBC Count: 36.8 10*3/uL — ABNORMAL HIGH (ref 4.0–10.5)
nRBC: 0.4 % — ABNORMAL HIGH (ref 0.0–0.2)

## 2021-05-06 LAB — CMP (CANCER CENTER ONLY)
ALT: 7 U/L (ref 0–44)
AST: 8 U/L — ABNORMAL LOW (ref 15–41)
Albumin: 3.7 g/dL (ref 3.5–5.0)
Alkaline Phosphatase: 96 U/L (ref 38–126)
Anion gap: 8 (ref 5–15)
BUN: 13 mg/dL (ref 6–20)
CO2: 38 mmol/L — ABNORMAL HIGH (ref 22–32)
Calcium: 8.3 mg/dL — ABNORMAL LOW (ref 8.9–10.3)
Chloride: 94 mmol/L — ABNORMAL LOW (ref 98–111)
Creatinine: 1.24 mg/dL — ABNORMAL HIGH (ref 0.44–1.00)
GFR, Estimated: 53 mL/min — ABNORMAL LOW (ref >60)
Glucose, Bld: 179 mg/dL — ABNORMAL HIGH (ref 70–99)
Potassium: 3.5 mmol/L (ref 3.5–5.1)
Sodium: 140 mmol/L (ref 135–145)
Total Bilirubin: 0.5 mg/dL (ref 0.3–1.2)
Total Protein: 6.4 g/dL — ABNORMAL LOW (ref 6.5–8.1)

## 2021-05-06 LAB — LACTATE DEHYDROGENASE: LDH: 453 U/L — ABNORMAL HIGH (ref 98–192)

## 2021-05-06 LAB — SAVE SMEAR(SSMR), FOR PROVIDER SLIDE REVIEW

## 2021-05-06 MED ORDER — PONATINIB HCL 15 MG PO TABS
15.0000 mg | ORAL_TABLET | Freq: Every day | ORAL | 4 refills | Status: DC
  Filled 2021-05-06 – 2021-09-17 (×2): qty 30, 30d supply, fill #0

## 2021-05-06 NOTE — Progress Notes (Signed)
Hematology and Oncology Follow Up Visit  Tabitha Estrada [id number] 1970-08-23 51 y.o. 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  She did see Dr. Florene Glen at [facility].  They do not think that she is a transplant candidate because of her other health issues.  However, there will not totally rule this out.  He suggested that we try her on ponatinib.  He was suggested the 15 mg daily dose.  I told her to start Lomotil before she takes the ponatinib.  Maybe this will help with her diarrhea.  Currently, she does not have any diarrhea.  Her last BCR/ABL was 102%.  She has some pain in the left upper quadrant of her abdomen.  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  We will get an ultrasound of her abdomen to see how that looks.  She still smokes about a pack of cigarettes a day.  Her blood sugars are actually pretty good for her.  She has had no fever.  Currently, I would say performance status is probably ECOG 1.  Medications:  Allergies as of 05/06/2021       Reactions   Dulaglutide Nausea And Vomiting, Other (See Comments)   Elemental Sulfur Nausea And Vomiting   Liraglutide Nausea And Vomiting   Metformin Diarrhea   Sulfamethoxazole Nausea And Vomiting        Medication List        Accurate as of May 06, 2021 12:15 PM. If you have any questions, ask your nurse or doctor.           albuterol 108 (90 Base) MCG/ACT inhaler Commonly known as: VENTOLIN HFA Inhale 1-2 puffs into the lungs every 4 (four) hours as needed for wheezing or shortness of breath.   albuterol (2.5 MG/3ML) 0.083% nebulizer solution Commonly known as: PROVENTIL Inhale the contents of one vial in nebulizer every four to six hours as needed for cough or wheeze.   aluminum-magnesium hydroxide-simethicone 222-979-89 MG/5ML Susp Commonly known as: MAALOX Take by mouth daily as needed.   amLODipine 10 MG tablet Commonly known as: NORVASC Take 10 mg by mouth daily.   Aspirin Low Dose 81 MG EC tablet Generic drug: aspirin Take 81 mg by mouth daily.   atorvastatin 80 MG tablet Commonly known as: LIPITOR TAKE 1 TABLET (80 MG TOTAL) BY MOUTH DAILY. NEEDS LAB WORK   budesonide-formoterol 160-4.5 MCG/ACT inhaler Commonly known as: SYMBICORT Inhale 2 puffs into the lungs 2 (two) times daily.   cefdinir 300 MG capsule Commonly known as: OMNICEF Take 2 capsules (600 mg total) by mouth daily.   clobetasol ointment 0.05 % Commonly known as: TEMOVATE Apply 1 application topically 2 (two) times daily.   colestipol 1 g tablet Commonly known as: COLESTID  Take 1 tablet (1 g total) by mouth every morning.   cyclobenzaprine 10 MG tablet Commonly known as: FLEXERIL Take 10 mg by mouth daily as needed.   Dexcom G6 Sensor Misc Apply topically.   diclofenac Sodium 1 % Gel Commonly known as: VOLTAREN SMARTSIG:Gram(s) Topical Twice Daily   diphenoxylate-atropine 2.5-0.025 MG tablet Commonly known as: LOMOTIL Take 1 tablet by mouth 4 (four) times daily as needed for diarrhea or loose stools (as needed after taking Sprycel for diarrhea).   ergocalciferol 1.25 MG (50000 UT) capsule Commonly known as: VITAMIN D2 Take 1 capsule by mouth once a week.   fluticasone 50 MCG/ACT nasal spray Commonly known as: FLONASE Use two sprays in each nostril once daily as directed.   folic acid 1 MG  tablet Commonly known as: FOLVITE Take 1 mg by mouth daily.   hydroxyurea 500 MG capsule Commonly known as: HYDREA Take 1 capsule (500 mg total) by mouth 2 (two) times daily. May take with food to minimize GI side effects.   insulin aspart 100 UNIT/ML FlexPen Commonly known as: NovoLOG FlexPen Inject 40 Units into the skin daily with supper. What changed:  how much to take additional instructions   insulin glargine (1 Unit Dial) 300 UNIT/ML Solostar Pen Commonly known as: TOUJEO Inject 70 Units into the skin daily.   ketoconazole 2 % cream Commonly known as: NIZORAL Apply topically 2 (two) times a day.   Magnesium Chloride 64 MG Tbec Take 64 mg by mouth daily.   meloxicam 7.5 MG tablet Commonly known as: MOBIC Take 7.5 mg by mouth daily.   montelukast 10 MG tablet Commonly known as: SINGULAIR TAKE 1 TABLET (10 MG TOTAL) BY MOUTH DAILY   nicotine 21 mg/24hr patch Commonly known as: NICODERM CQ - dosed in mg/24 hours 21 mg daily.   OXYGEN Inhale into the lungs. Pt reports 2-3 lpm continuous.   ponatinib HCl 15 MG tablet Commonly known as: ICLUSIG Take 1 tablet (15 mg total) by mouth daily.   potassium chloride SA 20 MEQ tablet Commonly known as: KLOR-CON M Take 1 tablet (20 mEq total) by mouth daily.   pregabalin 200 MG capsule Commonly known as: LYRICA Take 1 capsule by mouth 2 (two) times daily.   RABEprazole 20 MG tablet Commonly known as: ACIPHEX Take 1 tablet (20 mg total) by mouth 2 (two) times daily.   tiZANidine 4 MG tablet Commonly known as: ZANAFLEX Take by mouth daily as needed.   torsemide 20 MG tablet Commonly known as: DEMADEX Take 1 tablet by mouth daily.   valACYclovir 500 MG tablet Commonly known as: VALTREX TAKE ONE TABLET BY MOUTH AT BEDTIME   valsartan 40 MG tablet Commonly known as: DIOVAN Take 40 mg by mouth daily.        Allergies:  Allergies  Allergen Reactions   Dulaglutide Nausea And Vomiting and Other (See  Comments)   Elemental Sulfur Nausea And Vomiting   Liraglutide Nausea And Vomiting   Metformin Diarrhea   Sulfamethoxazole Nausea And Vomiting    Past Medical History, Surgical history, Social history, and Family History were reviewed and updated.  Review of Systems: Review of Systems  Constitutional: Negative.   Eyes: Negative.   Respiratory:  Positive for cough.   Cardiovascular:  Positive for palpitations.  Gastrointestinal:  Positive for diarrhea.  Genitourinary: Negative.   Musculoskeletal:  Positive for back pain and neck pain.  Skin:  Positive for rash.  Neurological:  Positive for headaches.  Endo/Heme/Allergies: Negative.  Psychiatric/Behavioral: Negative.      Physical Exam:  height is '5\' 4"'$  (1.626 m) and weight is 254 lb 1.3 oz (115.2 kg). Her oral temperature is 98.1 F (36.7 C). Her blood pressure is 110/61 and her pulse is 77. Her respiration is 18 and oxygen saturation is 95%.   Wt Readings from Last 3 Encounters:  05/06/21 254 lb 1.3 oz (115.2 kg)  04/02/21 259 lb (117.5 kg)  03/20/21 262 lb 8 oz (119.1 kg)  Her vital signs are temperature of 97.9.  Pulse 81.  Blood pressure 118/58.  Weight is 269 pounds.  Physical Exam Vitals reviewed.  HENT:     Head: Normocephalic and atraumatic.  Eyes:     Pupils: Pupils are equal, round, and reactive to light.  Cardiovascular:     Rate and Rhythm: Normal rate and regular rhythm.     Heart sounds: Normal heart sounds.  Pulmonary:     Effort: Pulmonary effort is normal.     Breath sounds: Normal breath sounds.  Abdominal:     General: Bowel sounds are normal.     Palpations: Abdomen is soft.  Musculoskeletal:        General: No tenderness or deformity. Normal range of motion.     Cervical back: Normal range of motion.  Lymphadenopathy:     Cervical: No cervical adenopathy.  Skin:    General: Skin is warm and dry.     Findings: No erythema or rash.  Neurological:     Mental Status: She is alert and oriented  to person, place, and time.  Psychiatric:        Behavior: Behavior normal.        Thought Content: Thought content normal.        Judgment: Judgment normal.     Lab Results  Component Value Date   WBC 36.8 (H) 05/06/2021   HGB 12.7 05/06/2021   HCT 40.3 05/06/2021   MCV 93.7 05/06/2021   PLT 716 (H) 05/06/2021   Lab Results  Component Value Date   FERRITIN 352 (H) 11/20/2020   IRON 46 11/20/2020   TIBC 223 (L) 11/20/2020   UIBC 177 11/20/2020   IRONPCTSAT 21 11/20/2020   Lab Results  Component Value Date   RETICCTPCT 1.5 08/29/2020   RBC 4.30 05/06/2021   No results found for: KPAFRELGTCHN, LAMBDASER, KAPLAMBRATIO Lab Results  Component Value Date   IGGSERUM 675 (L) 11/10/2016   IGMSERUM 86 11/10/2016   No results found for: Odetta Pink, SPEI   Chemistry      Component Value Date/Time   NA 140 05/06/2021 1023   NA 144 01/27/2017 1302   NA 138 09/16/2016 1105   K 3.5 05/06/2021 1023   K 4.3 01/27/2017 1302   K 4.2 09/16/2016 1105   CL 94 (L) 05/06/2021 1023   CL 102 01/27/2017 1302   CO2 38 (H) 05/06/2021 1023   CO2 27 01/27/2017 1302   CO2 26 09/16/2016 1105   BUN 13 05/06/2021 1023   BUN 14 01/27/2017 1302   BUN 11.9 09/16/2016 1105   CREATININE 1.24 (H) 05/06/2021 1023   CREATININE 1.1 01/27/2017 1302   CREATININE 0.8 09/16/2016 1105      Component Value Date/Time   CALCIUM 8.3 (L) 05/06/2021 1023   CALCIUM 9.5 01/27/2017 1302   CALCIUM 9.0 09/16/2016 1105   ALKPHOS 96 05/06/2021 1023   ALKPHOS 116 (H) 01/27/2017 1302   ALKPHOS 136 09/16/2016 1105  AST 8 (L) 05/06/2021 1023   AST 8 09/16/2016 1105   ALT 7 05/06/2021 1023   ALT 25 01/27/2017 1302   ALT 12 09/16/2016 1105   BILITOT 0.5 05/06/2021 1023   BILITOT 0.32 09/16/2016 1105       Impression and Plan: Ms. Nitta is a very pleasant 51 yo with chronic phase CML.  She really has had a hard time with all of the TKI medications.  We  are going to try her on low-dose ponatinib.  Hopefully, this will be tolerable. Again she will take the Lomotil on schedule.  I am sure that her BCR/ABL is quite high.  Her blood smear certainly looks quite active.  Hopefully, she will start the ponatinib within a week.  I recommend that she try to [facility].  Of course she had diarrhea with the Hydrea so she stopped this.  There really is few options for her.  Again I am not sure if she would be a allogenic bone marrow transplant candidate.  This would certainly be quite challenging.  I know another option would be homoharringtonine.  This is injectable.  I would like to get her back in 4 weeks.  We will certainly see her back sooner if she has problems.     [name], MD 3/8/202312:15 PM

## 2021-05-06 NOTE — Telephone Encounter (Signed)
Oral Chemotherapy Pharmacist Encounter  ? ?Called to check on status of Iclusig (ponatinib) with [facility] this AM (pharmacy required to fill patient's Iclusig). Informed by representative that the insurance they had on file for patient was lapsed. Provided most recent/updated insurance for pharmacy and requested that status of prescription be made set to urgent/expedited, since prescription was originally sent to their pharmacy on 04/30/21. ? ?Called to update patient. No answer - unable to leave voicemail due to mailbox being full.  ? ?Oral Oncology Clinic will continue to follow.  ? ?Leron Croak, PharmD, BCPS ?Hematology/Oncology Clinical Pharmacist ?[facility] Oral Chemotherapy Navigation Clinics ?[phone] ?05/06/2021 9:47 AM ? ?

## 2021-05-07 ENCOUNTER — Encounter: Payer: Self-pay | Admitting: Hematology & Oncology

## 2021-05-07 ENCOUNTER — Other Ambulatory Visit: Payer: Self-pay | Admitting: *Deleted

## 2021-05-07 DIAGNOSIS — C9210 Chronic myeloid leukemia, BCR/ABL-positive, not having achieved remission: Secondary | ICD-10-CM

## 2021-05-07 DIAGNOSIS — K591 Functional diarrhea: Secondary | ICD-10-CM

## 2021-05-07 MED ORDER — MECLIZINE HCL 25 MG PO TABS
25.0000 mg | ORAL_TABLET | Freq: Three times a day (TID) | ORAL | 0 refills | Status: AC | PRN
Start: 2021-05-07 — End: ?

## 2021-05-07 MED ORDER — DIPHENOXYLATE-ATROPINE 2.5-0.025 MG PO TABS: 1.0000 | ORAL_TABLET | Freq: Four times a day (QID) | ORAL | 1 refills | Status: DC | PRN

## 2021-05-08 ENCOUNTER — Encounter: Payer: Self-pay | Admitting: Family

## 2021-05-08 ENCOUNTER — Other Ambulatory Visit (HOSPITAL_COMMUNITY): Payer: Self-pay

## 2021-05-11 ENCOUNTER — Other Ambulatory Visit (HOSPITAL_COMMUNITY): Payer: Self-pay

## 2021-05-11 ENCOUNTER — Encounter: Payer: Self-pay | Admitting: Family

## 2021-05-12 ENCOUNTER — Other Ambulatory Visit (HOSPITAL_COMMUNITY): Payer: Self-pay

## 2021-05-12 NOTE — Telephone Encounter (Signed)
[employer] this morning at 10:54 am to check the status of Iclusig. Rep, Margreta Journey, stated she would expedite the benefits investigation for [place] [employer] and it would be completed by the end of day.  ? ?I called back at 4:30 pm to make sure there weren't any issues.  The rep I spoke with stated the managed Medicaid plan that she had was term'd.  I let him know the patient no longer had that plan and it was regular [place] [employer].  I gave him the billing information and he would mark it as urgent to the benefits team.  He stated we should have a summary by tomorrow. ? ?I will call back tomorrow to check status and update this encounter. ? ?Dennison Nancy CPHT ?Specialty Pharmacy Patient Advocate ?[place] ?Phone [phone] ?Fax [phone] ?05/12/2021 4:43 PM ? ?

## 2021-05-13 NOTE — Telephone Encounter (Signed)
Oral Chemotherapy Pharmacist Encounter ? ?Ponatinib (Iclusig) will arrive from [place] on 05/15/21. Patient will start on 3/17 or 3/18 depending on the time medication arrives.  ? ?Patient Education ?I spoke with patient for overview of new oral chemotherapy medication: ponatinib (Iclusig) for the treatment of chronic phase CML, planned duration until disease progression or unacceptable drug toxicity.  ? ?Note previous therapies include Gleevec, Sprycel, Bosulif, Tasigna, and Scemblix - all discontinued due to intolerable side effects.  ? ?Pt is doing well. Counseled patient on administration, dosing, side effects, monitoring, drug-food interactions, safe handling, storage, and disposal. ?Patient will take 1 tablet (15 mg) by mouth daily. ? ?Side effects include but not limited to: elevated blood pressure, rash, itchy skin, constipation, fatigue, or headache.   ? ?Reviewed with patient importance of keeping a medication schedule and plan for any missed doses. ? ?After discussion with patient no patient barriers to medication adherence identified.  ? ?Ms. Nolasco voiced understanding and appreciation. All questions answered. Medication handout provided. ? ?Provided patient with [facility] phone number. Patient knows to call the office with questions or concerns. Oral Chemotherapy Navigation Clinic will continue to follow. ? ?Benn Moulder, PharmD ?Pharmacy Resident  ?05/13/2021 ?12:12 PM ? ?

## 2021-05-13 NOTE — Telephone Encounter (Signed)
[place] to check status of Iclusig prescription.  Rep stated that delivery has been scheduled and patient should receive medication by [employer] (signature required) on 05/15/21. ? ?Dennison Nancy CPHT ?Specialty Pharmacy Patient Advocate ?[place] ?Phone [phone] ?Fax [phone] ?05/13/2021 3:12 PM ? ?

## 2021-05-15 ENCOUNTER — Ambulatory Visit (INDEPENDENT_AMBULATORY_CARE_PROVIDER_SITE_OTHER): Payer: Medicaid Other

## 2021-05-15 ENCOUNTER — Other Ambulatory Visit: Payer: Self-pay

## 2021-05-15 DIAGNOSIS — R161 Splenomegaly, not elsewhere classified: Secondary | ICD-10-CM | POA: Diagnosis not present

## 2021-05-15 DIAGNOSIS — C9210 Chronic myeloid leukemia, BCR/ABL-positive, not having achieved remission: Secondary | ICD-10-CM

## 2021-05-18 ENCOUNTER — Encounter: Payer: Self-pay | Admitting: *Deleted

## 2021-05-18 LAB — BCR/ABL

## 2021-05-29 ENCOUNTER — Encounter: Payer: Self-pay | Admitting: Hematology & Oncology

## 2021-05-29 ENCOUNTER — Encounter: Payer: Self-pay | Admitting: *Deleted

## 2021-05-29 ENCOUNTER — Telehealth: Payer: Self-pay | Admitting: *Deleted

## 2021-05-29 ENCOUNTER — Ambulatory Visit (HOSPITAL_BASED_OUTPATIENT_CLINIC_OR_DEPARTMENT_OTHER): Payer: Medicaid Other

## 2021-05-29 ENCOUNTER — Ambulatory Visit (INDEPENDENT_AMBULATORY_CARE_PROVIDER_SITE_OTHER): Payer: Medicaid Other

## 2021-05-29 ENCOUNTER — Other Ambulatory Visit: Payer: Self-pay | Admitting: Family

## 2021-05-29 DIAGNOSIS — C9210 Chronic myeloid leukemia, BCR/ABL-positive, not having achieved remission: Secondary | ICD-10-CM | POA: Diagnosis not present

## 2021-05-29 NOTE — Telephone Encounter (Signed)
Call placed to patient to inform her that [place] in [place] can do her dopplers, but need her there ASAP to get results to Dr Marin Olp today.  Pt states that she can be there by between 3-3:30PM today.  Call placed back to [facility] in [place] to notify her of pt.'s arrival time.  ? ?

## 2021-05-30 ENCOUNTER — Encounter: Payer: Self-pay | Admitting: Family

## 2021-06-03 ENCOUNTER — Telehealth: Payer: Self-pay

## 2021-06-03 ENCOUNTER — Other Ambulatory Visit: Payer: Self-pay

## 2021-06-03 DIAGNOSIS — K2270 Barrett's esophagus without dysplasia: Secondary | ICD-10-CM

## 2021-06-03 DIAGNOSIS — K219 Gastro-esophageal reflux disease without esophagitis: Secondary | ICD-10-CM

## 2021-06-03 NOTE — Telephone Encounter (Signed)
Called the patient to schedule egd at [facility] on June 6, 23 at 7:30am with Dr. Bryan Lemma. Need to confirm with the patient if available date is ok with her. She has to be at the hospital at [facility]. ?

## 2021-06-04 NOTE — Telephone Encounter (Signed)
Patient called back and confirmed 08/04/21 @ 7.30am will work for her egd at [place].  ?

## 2021-06-04 NOTE — Telephone Encounter (Signed)
LVM to the patient that she is scheduled for an egd on 08/04/21 @ 7.30am at [place]. Patient Instruction is sent to her MyChart and mailed to her home address as well. ?

## 2021-06-05 ENCOUNTER — Inpatient Hospital Stay (HOSPITAL_BASED_OUTPATIENT_CLINIC_OR_DEPARTMENT_OTHER): Payer: Medicaid Other | Admitting: Hematology & Oncology

## 2021-06-05 ENCOUNTER — Other Ambulatory Visit: Payer: Self-pay

## 2021-06-05 ENCOUNTER — Inpatient Hospital Stay: Payer: Medicaid Other | Attending: Hematology & Oncology

## 2021-06-05 ENCOUNTER — Encounter: Payer: Self-pay | Admitting: Hematology & Oncology

## 2021-06-05 VITALS — BP 115/68 | HR 77 | Temp 97.7°F | Resp 18 | Ht 64.0 in | Wt 247.1 lb

## 2021-06-05 DIAGNOSIS — D509 Iron deficiency anemia, unspecified: Secondary | ICD-10-CM | POA: Diagnosis present

## 2021-06-05 DIAGNOSIS — C9310 Chronic myelomonocytic leukemia not having achieved remission: Secondary | ICD-10-CM | POA: Insufficient documentation

## 2021-06-05 DIAGNOSIS — C9210 Chronic myeloid leukemia, BCR/ABL-positive, not having achieved remission: Secondary | ICD-10-CM | POA: Diagnosis not present

## 2021-06-05 DIAGNOSIS — E119 Type 2 diabetes mellitus without complications: Secondary | ICD-10-CM | POA: Insufficient documentation

## 2021-06-05 DIAGNOSIS — K5900 Constipation, unspecified: Secondary | ICD-10-CM | POA: Insufficient documentation

## 2021-06-05 LAB — CBC WITH DIFFERENTIAL (CANCER CENTER ONLY)
Abs Immature Granulocytes: 0.82 10*3/uL — ABNORMAL HIGH (ref 0.00–0.07)
Basophils Absolute: 0.3 10*3/uL — ABNORMAL HIGH (ref 0.0–0.1)
Basophils Relative: 2 %
Eosinophils Absolute: 0.1 10*3/uL (ref 0.0–0.5)
Eosinophils Relative: 0 %
HCT: 39.8 % (ref 36.0–46.0)
Hemoglobin: 12.6 g/dL (ref 12.0–15.0)
Immature Granulocytes: 6 %
Lymphocytes Relative: 13 %
Lymphs Abs: 1.9 10*3/uL (ref 0.7–4.0)
MCH: 28.6 pg (ref 26.0–34.0)
MCHC: 31.7 g/dL (ref 30.0–36.0)
MCV: 90.5 fL (ref 80.0–100.0)
Monocytes Absolute: 0.3 10*3/uL (ref 0.1–1.0)
Monocytes Relative: 2 %
Neutro Abs: 11.2 10*3/uL — ABNORMAL HIGH (ref 1.7–7.7)
Neutrophils Relative %: 77 %
Platelet Count: 489 10*3/uL — ABNORMAL HIGH (ref 150–400)
RBC: 4.4 MIL/uL (ref 3.87–5.11)
RDW: 16.3 % — ABNORMAL HIGH (ref 11.5–15.5)
WBC Count: 14.4 10*3/uL — ABNORMAL HIGH (ref 4.0–10.5)
nRBC: 0 % (ref 0.0–0.2)

## 2021-06-05 LAB — CMP (CANCER CENTER ONLY)
ALT: 16 U/L (ref 0–44)
AST: 8 U/L — ABNORMAL LOW (ref 15–41)
Albumin: 3.9 g/dL (ref 3.5–5.0)
Alkaline Phosphatase: 137 U/L — ABNORMAL HIGH (ref 38–126)
Anion gap: 10 (ref 5–15)
BUN: 14 mg/dL (ref 6–20)
CO2: 29 mmol/L (ref 22–32)
Calcium: 10 mg/dL (ref 8.9–10.3)
Chloride: 100 mmol/L (ref 98–111)
Creatinine: 1.22 mg/dL — ABNORMAL HIGH (ref 0.44–1.00)
GFR, Estimated: 54 mL/min — ABNORMAL LOW (ref >60)
Glucose, Bld: 305 mg/dL — ABNORMAL HIGH (ref 70–99)
Potassium: 5 mmol/L (ref 3.5–5.1)
Sodium: 139 mmol/L (ref 135–145)
Total Bilirubin: 0.5 mg/dL (ref 0.3–1.2)
Total Protein: 6.7 g/dL (ref 6.5–8.1)

## 2021-06-05 LAB — SAVE SMEAR(SSMR), FOR PROVIDER SLIDE REVIEW

## 2021-06-05 NOTE — Progress Notes (Signed)
?Hematology and Oncology Follow Up Visit ? ?Tabitha Estrada ?[id number] ?September 07, 1970 51 y.o. ?06/05/2021 ? ? ?Principle Diagnosis:  ?CML -- Chronic Phase ?Iron deficiency anemia  ?  ?Past Therapy: ?Gleevec 400 mg po q day -- D/C'd on 06/15/2018 for periorbital edema  ?Sprycel 100 mg PO every other day (changed 08/04/2018) - d/c on 05/01/2019 ?Bosulif 200 mg po q day -- start on 05/07/2019 - patient stopped 06/26/2019 due to diarrhea  ?  ?Current Therapy:        ?Tasigna 50 mg po q day -- start in 11/2019   -- d/c on 04/09/2020 ?Scemblix  20 mg po day -- start on 02/19/2021 --DC due to intolerance ?Hydrea 500 mg po BID -- start on 04/02/2021 ?Ponatinib 15 mg po q day -- start on 05/08/2021 ?  ?Interim History:  Tabitha Estrada is here today for follow-up.  She is on the ponatinib now.  Surprisingly, she now has constipation.  I am unsure as to how or why she has this.  Ponatinib is no other TKI.  I thought for sure that she would be having diarrhea. ? ?The ponatinib is working.  Her white cell count is down.  Her platelet count is down.  Over last saw her, the BCR/ABL was down to 92%. ? ?Again, I am not sure as to why she does have the constipation.  I told her to try some MiraLAX to see if this might help. ? ?She does have diabetes.  Her blood sugar today was 305.  She says she was not eating much today. ? ?She still has some pain in the legs.  We did get a sonogram of her legs.  There is no evidence of thromboembolic disease. ? ?She did have ultrasound of her abdomen on 05/15/2021.  This showed that she had a normal splenic size. ? ?She says that she is back to see the transplant doctors at [facility] on the May 4. ? ?Currently, I would say performance status is probably ECOG 1. ? ?Medications:  ?Allergies as of 06/05/2021   ? ?   Reactions  ? Dulaglutide Nausea And Vomiting, Other (See Comments)  ? Elemental Sulfur Nausea And Vomiting  ? Liraglutide Nausea And Vomiting  ? Metformin Diarrhea  ? Sulfamethoxazole Nausea And Vomiting   ? ?  ? ?  ?Medication List  ?  ? ?  ? Accurate as of June 05, 2021 12:33 PM. If you have any questions, ask your nurse or doctor.  ?  ?  ? ?  ? ?STOP taking these medications   ? ?aluminum-magnesium hydroxide-simethicone 200-200-20 MG/5ML Susp ?Commonly known as: MAALOX ?Stopped by: [name], MD ?  ?cefdinir 300 MG capsule ?Commonly known as: OMNICEF ?Stopped by: [name], MD ?  ?clobetasol ointment 0.05 % ?Commonly known as: TEMOVATE ?Stopped by: [name], MD ?  ?hydroxyurea 500 MG capsule ?Commonly known as: HYDREA ?Stopped by: [name], MD ?  ?RABEprazole 20 MG tablet ?Commonly known as: ACIPHEX ?Stopped by: [name], MD ?  ?tiZANidine 4 MG tablet ?Commonly known as: ZANAFLEX ?Stopped by: [name], MD ?  ? ?  ? ?TAKE these medications   ? ?albuterol 108 (90 Base) MCG/ACT inhaler ?Commonly known as: VENTOLIN HFA ?Inhale 1-2 puffs into the lungs every 4 (four) hours as needed for wheezing or shortness of breath. ?  ?albuterol (2.5 MG/3ML) 0.083% nebulizer solution ?Commonly known as: PROVENTIL ?Inhale the contents of one vial in nebulizer every four to  six hours as needed for cough or wheeze. ?  ?amLODipine 10 MG tablet ?Commonly known as: NORVASC ?Take 10 mg by mouth daily. ?  ?Aspirin Low Dose 81 MG EC tablet ?Generic drug: aspirin ?Take 81 mg by mouth daily. ?  ?atorvastatin 80 MG tablet ?Commonly known as: LIPITOR ?TAKE 1 TABLET (80 MG TOTAL) BY MOUTH DAILY. NEEDS LAB WORK ?  ?budesonide-formoterol 160-4.5 MCG/ACT inhaler ?Commonly known as: SYMBICORT ?Inhale 2 puffs into the lungs 2 (two) times daily. ?  ?colestipol 1 g tablet ?Commonly known as: COLESTID ?Take 1 tablet (1 g total) by mouth every morning. ?  ?cyclobenzaprine 10 MG tablet ?Commonly known as: FLEXERIL ?Take 10 mg by mouth daily as needed. ?  ?Dexcom G6 Sensor Misc ?Apply topically. ?  ?diclofenac Sodium 1 % Gel ?Commonly known as: VOLTAREN ?SMARTSIG:Gram(s) Topical Twice Daily ?  ?diphenoxylate-atropine  2.5-0.025 MG tablet ?Commonly known as: LOMOTIL ?Take 1 tablet by mouth 4 (four) times daily as needed for diarrhea or loose stools (as needed after taking Sprycel for diarrhea). ?  ?ergocalciferol 1.25 MG (50000 UT) capsule ?Commonly known as: VITAMIN D2 ?Take 1 capsule by mouth once a week. ?  ?fluticasone 50 MCG/ACT nasal spray ?Commonly known as: FLONASE ?Use two sprays in each nostril once daily as directed. ?  ?folic acid 1 MG tablet ?Commonly known as: FOLVITE ?Take 1 mg by mouth daily. ?  ?insulin aspart 100 UNIT/ML FlexPen ?Commonly known as: NovoLOG FlexPen ?Inject 40 Units into the skin daily with supper. ?What changed:  ?how much to take ?additional instructions ?  ?insulin glargine (1 Unit Dial) 300 UNIT/ML Solostar Pen ?Commonly known as: TOUJEO ?Inject 70 Units into the skin daily. ?  ?ketoconazole 2 % cream ?Commonly known as: NIZORAL ?Apply topically 2 (two) times a day. ?  ?Magnesium Chloride 64 MG Tbec ?Take 64 mg by mouth daily. ?  ?meclizine 25 MG tablet ?Commonly known as: ANTIVERT ?Take 1 tablet (25 mg total) by mouth 3 (three) times daily as needed for dizziness. ?  ?meloxicam 7.5 MG tablet ?Commonly known as: MOBIC ?Take 7.5 mg by mouth daily. ?  ?montelukast 10 MG tablet ?Commonly known as: SINGULAIR ?TAKE 1 TABLET (10 MG TOTAL) BY MOUTH DAILY ?  ?nicotine 21 mg/24hr patch ?Commonly known as: NICODERM CQ - dosed in mg/24 hours ?21 mg daily. ?  ?OXYGEN ?Inhale into the lungs. Pt reports 2-3 lpm continuous. ?  ?ponatinib HCl 15 MG tablet ?Commonly known as: ICLUSIG ?Take 1 tablet (15 mg total) by mouth daily. ?  ?potassium chloride SA 20 MEQ tablet ?Commonly known as: KLOR-CON M ?Take 1 tablet (20 mEq total) by mouth daily. ?  ?pregabalin 200 MG capsule ?Commonly known as: LYRICA ?Take 1 capsule by mouth 2 (two) times daily. ?  ?torsemide 20 MG tablet ?Commonly known as: DEMADEX ?Take 1 tablet by mouth daily. ?  ?valACYclovir 500 MG tablet ?Commonly known as: VALTREX ?TAKE ONE TABLET BY MOUTH  AT BEDTIME ?  ?valsartan 40 MG tablet ?Commonly known as: DIOVAN ?Take 40 mg by mouth daily. ?  ? ?  ? ? ?Allergies:  ?Allergies  ?Allergen Reactions  ? Dulaglutide Nausea And Vomiting and Other (See Comments)  ? Elemental Sulfur Nausea And Vomiting  ? Liraglutide Nausea And Vomiting  ? Metformin Diarrhea  ? Sulfamethoxazole Nausea And Vomiting  ? ? ?Past Medical History, Surgical history, Social history, and Family History were reviewed and updated. ? ?Review of Systems: ?Review of Systems  ?Constitutional: Negative.   ?Eyes: Negative.   ?  Respiratory:  Positive for cough.   ?Cardiovascular:  Positive for palpitations.  ?Gastrointestinal:  Positive for diarrhea.  ?Genitourinary: Negative.   ?Musculoskeletal:  Positive for back pain and neck pain.  ?Skin:  Positive for rash.  ?Neurological:  Positive for headaches.  ?Endo/Heme/Allergies: Negative.   ?Psychiatric/Behavioral: Negative.    ? ? ?Physical Exam: ? height is '5\' 4"'$  (1.626 m) and weight is 247 lb 1.9 oz (112.1 kg). Her oral temperature is 97.7 ?F (36.5 ?C). Her blood pressure is 115/68 and her pulse is 77. Her respiration is 18 and oxygen saturation is 98%.  ? ?Wt Readings from Last 3 Encounters:  ?06/05/21 247 lb 1.9 oz (112.1 kg)  ?05/06/21 254 lb 1.3 oz (115.2 kg)  ?04/02/21 259 lb (117.5 kg)  ?Her vital signs are temperature of 97.9.  Pulse 81.  Blood pressure 118/58.  Weight is 269 pounds. ? ?Physical Exam ?Vitals reviewed.  ?HENT:  ?   Head: Normocephalic and atraumatic.  ?Eyes:  ?   Pupils: Pupils are equal, round, and reactive to light.  ?Cardiovascular:  ?   Rate and Rhythm: Normal rate and regular rhythm.  ?   Heart sounds: Normal heart sounds.  ?Pulmonary:  ?   Effort: Pulmonary effort is normal.  ?   Breath sounds: Normal breath sounds.  ?Abdominal:  ?   General: Bowel sounds are normal.  ?   Palpations: Abdomen is soft.  ?Musculoskeletal:     ?   General: No tenderness or deformity. Normal range of motion.  ?   Cervical back: Normal range of  motion.  ?Lymphadenopathy:  ?   Cervical: No cervical adenopathy.  ?Skin: ?   General: Skin is warm and dry.  ?   Findings: No erythema or rash.  ?Neurological:  ?   Mental Status: She is alert and orie

## 2021-06-12 ENCOUNTER — Encounter: Payer: Self-pay | Admitting: Hematology & Oncology

## 2021-06-16 LAB — BCR/ABL

## 2021-06-17 ENCOUNTER — Encounter: Payer: Self-pay | Admitting: Hematology & Oncology

## 2021-07-01 ENCOUNTER — Encounter: Payer: Self-pay | Admitting: Hematology & Oncology

## 2021-07-01 ENCOUNTER — Inpatient Hospital Stay: Payer: Medicaid Other | Attending: Hematology & Oncology

## 2021-07-01 ENCOUNTER — Other Ambulatory Visit: Payer: Self-pay

## 2021-07-01 ENCOUNTER — Inpatient Hospital Stay (HOSPITAL_BASED_OUTPATIENT_CLINIC_OR_DEPARTMENT_OTHER): Payer: Medicaid Other | Admitting: Hematology & Oncology

## 2021-07-01 VITALS — BP 113/76 | HR 70 | Temp 98.0°F | Resp 18 | Ht 64.0 in | Wt 248.0 lb

## 2021-07-01 DIAGNOSIS — C9210 Chronic myeloid leukemia, BCR/ABL-positive, not having achieved remission: Secondary | ICD-10-CM

## 2021-07-01 DIAGNOSIS — D509 Iron deficiency anemia, unspecified: Secondary | ICD-10-CM | POA: Insufficient documentation

## 2021-07-01 DIAGNOSIS — C9310 Chronic myelomonocytic leukemia not having achieved remission: Secondary | ICD-10-CM | POA: Diagnosis present

## 2021-07-01 DIAGNOSIS — F17210 Nicotine dependence, cigarettes, uncomplicated: Secondary | ICD-10-CM | POA: Diagnosis not present

## 2021-07-01 DIAGNOSIS — K5900 Constipation, unspecified: Secondary | ICD-10-CM | POA: Diagnosis not present

## 2021-07-01 LAB — CMP (CANCER CENTER ONLY)
ALT: 9 U/L (ref 0–44)
AST: 9 U/L — ABNORMAL LOW (ref 15–41)
Albumin: 3.7 g/dL (ref 3.5–5.0)
Alkaline Phosphatase: 105 U/L (ref 38–126)
Anion gap: 9 (ref 5–15)
BUN: 15 mg/dL (ref 6–20)
CO2: 36 mmol/L — ABNORMAL HIGH (ref 22–32)
Calcium: 8.2 mg/dL — ABNORMAL LOW (ref 8.9–10.3)
Chloride: 96 mmol/L — ABNORMAL LOW (ref 98–111)
Creatinine: 1.3 mg/dL — ABNORMAL HIGH (ref 0.44–1.00)
GFR, Estimated: 50 mL/min — ABNORMAL LOW (ref >60)
Glucose, Bld: 98 mg/dL (ref 70–99)
Potassium: 3.8 mmol/L (ref 3.5–5.1)
Sodium: 141 mmol/L (ref 135–145)
Total Bilirubin: 0.6 mg/dL (ref 0.3–1.2)
Total Protein: 6.4 g/dL — ABNORMAL LOW (ref 6.5–8.1)

## 2021-07-01 LAB — CBC WITH DIFFERENTIAL (CANCER CENTER ONLY)
Abs Immature Granulocytes: 0.03 10*3/uL (ref 0.00–0.07)
Basophils Absolute: 0.1 10*3/uL (ref 0.0–0.1)
Basophils Relative: 2 %
Eosinophils Absolute: 0 10*3/uL (ref 0.0–0.5)
Eosinophils Relative: 0 %
HCT: 38.2 % (ref 36.0–46.0)
Hemoglobin: 12.1 g/dL (ref 12.0–15.0)
Immature Granulocytes: 0 %
Lymphocytes Relative: 24 %
Lymphs Abs: 2 10*3/uL (ref 0.7–4.0)
MCH: 27.8 pg (ref 26.0–34.0)
MCHC: 31.7 g/dL (ref 30.0–36.0)
MCV: 87.8 fL (ref 80.0–100.0)
Monocytes Absolute: 0.3 10*3/uL (ref 0.1–1.0)
Monocytes Relative: 4 %
Neutro Abs: 5.8 10*3/uL (ref 1.7–7.7)
Neutrophils Relative %: 70 %
Platelet Count: 128 10*3/uL — ABNORMAL LOW (ref 150–400)
RBC: 4.35 MIL/uL (ref 3.87–5.11)
RDW: 16.9 % — ABNORMAL HIGH (ref 11.5–15.5)
WBC Count: 8.2 10*3/uL (ref 4.0–10.5)
nRBC: 0 % (ref 0.0–0.2)

## 2021-07-01 LAB — LACTATE DEHYDROGENASE: LDH: 169 U/L (ref 98–192)

## 2021-07-01 LAB — SAVE SMEAR(SSMR), FOR PROVIDER SLIDE REVIEW

## 2021-07-01 NOTE — Progress Notes (Signed)
?Hematology and Oncology Follow Up Visit ? ?Tabitha Estrada ?[id number] ?Nov 16, 1970 51 y.o. ?07/01/2021 ? ? ?Principle Diagnosis:  ?CML -- Chronic Phase ?Iron deficiency anemia  ?  ?Past Therapy: ?Gleevec 400 mg po q day -- D/C'd on 06/15/2018 for periorbital edema  ?Sprycel 100 mg PO every other day (changed 08/04/2018) - d/c on 05/01/2019 ?Bosulif 200 mg po q day -- start on 05/07/2019 - patient stopped 06/26/2019 due to diarrhea  ?  ?Current Therapy:        ?Tasigna 50 mg po q day -- start in 11/2019   -- d/c on 04/09/2020 ?Scemblix  20 mg po day -- start on 02/19/2021 --DC due to intolerance ?Hydrea 500 mg po BID -- start on 04/02/2021 ?Ponatinib 15 mg po q day -- start on 05/08/2021 -- hold on 06/15/2021 ?  ?Interim History:  Ms. Tabitha Estrada is here today for follow-up.  We have the ponatinib on hold for right now.  She is having problems with respect to constipation.  Constipation is a little bit better. ? ?She now has problems with the second toe on her left foot.  She needs to see the [facility].  This is on May 16.  I saw a picture of the toe.  I would not be surprised if she needs to have the toe taken off. ? ?I think the big problem is that she is still smoking a pack a day cigarettes. ? ?When we last saw her, the BCR/ABL was 114%. ? ?Her blood counts look great today.  Hopefully, the BCR/ABL is down a little bit. ? ?Her blood sugars are doing a whole lot better. ? ?She has had no cough.  There is no nausea or vomiting.  She has had no problems with urine. ? ?Overall, I would have said that her performance status right now is ECOG 1.   ? ?Medications:  ?Allergies as of 07/01/2021   ? ?   Reactions  ? Dulaglutide Nausea And Vomiting, Other (See Comments)  ? Elemental Sulfur Nausea And Vomiting  ? Liraglutide Nausea And Vomiting  ? Metformin Diarrhea  ? Sulfamethoxazole Nausea And Vomiting  ? ?  ? ?  ?Medication List  ?  ? ?  ? Accurate as of Jul 01, 2021 10:42 AM. If you have any questions, ask your nurse or  doctor.  ?  ?  ? ?  ? ?STOP taking these medications   ? ?diclofenac Sodium 1 % Gel ?Commonly known as: VOLTAREN ?Stopped by: [name], MD ?  ?ketoconazole 2 % cream ?Commonly known as: NIZORAL ?Stopped by: [name], MD ?  ?meloxicam 7.5 MG tablet ?Commonly known as: MOBIC ?Stopped by: [name], MD ?  ? ?  ? ?TAKE these medications   ? ?albuterol 108 (90 Base) MCG/ACT inhaler ?Commonly known as: VENTOLIN HFA ?Inhale 1-2 puffs into the lungs every 4 (four) hours as needed for wheezing or shortness of breath. ?  ?albuterol (2.5 MG/3ML) 0.083% nebulizer solution ?Commonly known as: PROVENTIL ?Inhale the contents of one vial in nebulizer every four to six hours as needed for cough or wheeze. ?  ?amLODipine 10 MG tablet ?Commonly known as: NORVASC ?Take 10 mg by mouth daily. ?  ?Aspirin Low Dose 81 MG EC tablet ?Generic drug: aspirin ?Take 81 mg by mouth daily. ?  ?atorvastatin 80 MG tablet ?Commonly known as: LIPITOR ?TAKE 1 TABLET (80 MG TOTAL) BY MOUTH DAILY. NEEDS LAB WORK ?  ?budesonide-formoterol 160-4.5 MCG/ACT inhaler ?Commonly  known as: SYMBICORT ?Inhale 2 puffs into the lungs 2 (two) times daily. ?  ?colestipol 1 g tablet ?Commonly known as: COLESTID ?Take 1 tablet (1 g total) by mouth every morning. ?  ?cyclobenzaprine 10 MG tablet ?Commonly known as: FLEXERIL ?Take 10 mg by mouth daily as needed. ?  ?Dexcom G6 Sensor Misc ?Apply topically. ?  ?diphenoxylate-atropine 2.5-0.025 MG tablet ?Commonly known as: LOMOTIL ?Take 1 tablet by mouth 4 (four) times daily as needed for diarrhea or loose stools (as needed after taking Sprycel for diarrhea). ?  ?doxycycline 100 MG capsule ?Commonly known as: VIBRAMYCIN ?Take 100 mg by mouth 2 (two) times daily. ?  ?ergocalciferol 1.25 MG (50000 UT) capsule ?Commonly known as: VITAMIN D2 ?Take 1 capsule by mouth once a week. ?  ?fluticasone 50 MCG/ACT nasal spray ?Commonly known as: FLONASE ?Use two sprays in each nostril once daily as directed. ?  ?folic  acid 1 MG tablet ?Commonly known as: FOLVITE ?Take 1 mg by mouth daily. ?  ?insulin aspart 100 UNIT/ML FlexPen ?Commonly known as: NovoLOG FlexPen ?Inject 40 Units into the skin daily with supper. ?What changed:  ?how much to take ?additional instructions ?  ?insulin glargine (1 Unit Dial) 300 UNIT/ML Solostar Pen ?Commonly known as: TOUJEO ?Inject 70 Units into the skin daily. ?  ?Magnesium Chloride 64 MG Tbec ?Take 64 mg by mouth daily. ?  ?meclizine 25 MG tablet ?Commonly known as: ANTIVERT ?Take 1 tablet (25 mg total) by mouth 3 (three) times daily as needed for dizziness. ?  ?montelukast 10 MG tablet ?Commonly known as: SINGULAIR ?TAKE 1 TABLET (10 MG TOTAL) BY MOUTH DAILY ?  ?nicotine 21 mg/24hr patch ?Commonly known as: NICODERM CQ - dosed in mg/24 hours ?21 mg daily. ?  ?OXYGEN ?Inhale into the lungs. Pt reports 2-3 lpm continuous. ?  ?ponatinib HCl 15 MG tablet ?Commonly known as: ICLUSIG ?Take 1 tablet (15 mg total) by mouth daily. ?  ?potassium chloride SA 20 MEQ tablet ?Commonly known as: KLOR-CON M ?Take 1 tablet (20 mEq total) by mouth daily. ?  ?pregabalin 200 MG capsule ?Commonly known as: LYRICA ?Take 1 capsule by mouth 2 (two) times daily. ?  ?torsemide 20 MG tablet ?Commonly known as: DEMADEX ?Take 1 tablet by mouth daily. ?  ?valACYclovir 500 MG tablet ?Commonly known as: VALTREX ?TAKE ONE TABLET BY MOUTH AT BEDTIME ?  ?valsartan 40 MG tablet ?Commonly known as: DIOVAN ?Take 40 mg by mouth daily. ?  ? ?  ? ? ?Allergies:  ?Allergies  ?Allergen Reactions  ? Dulaglutide Nausea And Vomiting and Other (See Comments)  ? Elemental Sulfur Nausea And Vomiting  ? Liraglutide Nausea And Vomiting  ? Metformin Diarrhea  ? Sulfamethoxazole Nausea And Vomiting  ? ? ?Past Medical History, Surgical history, Social history, and Family History were reviewed and updated. ? ?Review of Systems: ?Review of Systems  ?Constitutional: Negative.   ?Eyes: Negative.   ?Respiratory:  Positive for cough.   ?Cardiovascular:   Positive for palpitations.  ?Gastrointestinal:  Positive for diarrhea.  ?Genitourinary: Negative.   ?Musculoskeletal:  Positive for back pain and neck pain.  ?Skin:  Positive for rash.  ?Neurological:  Positive for headaches.  ?Endo/Heme/Allergies: Negative.   ?Psychiatric/Behavioral: Negative.    ? ? ?Physical Exam: ? height is '5\' 4"'$  (1.626 m) and weight is 248 lb (112.5 kg). Her oral temperature is 98 ?F (36.7 ?C). Her blood pressure is 113/76 and her pulse is 70. Her respiration is 18 and oxygen saturation is  100%.  ? ?Wt Readings from Last 3 Encounters:  ?07/01/21 248 lb (112.5 kg)  ?06/05/21 247 lb 1.9 oz (112.1 kg)  ?05/06/21 254 lb 1.3 oz (115.2 kg)  ?Her vital signs are temperature of 97.9.  Pulse 81.  Blood pressure 118/58.  Weight is 269 pounds. ? ?Physical Exam ?Vitals reviewed.  ?HENT:  ?   Head: Normocephalic and atraumatic.  ?Eyes:  ?   Pupils: Pupils are equal, round, and reactive to light.  ?Cardiovascular:  ?   Rate and Rhythm: Normal rate and regular rhythm.  ?   Heart sounds: Normal heart sounds.  ?Pulmonary:  ?   Effort: Pulmonary effort is normal.  ?   Breath sounds: Normal breath sounds.  ?Abdominal:  ?   General: Bowel sounds are normal.  ?   Palpations: Abdomen is soft.  ?Musculoskeletal:     ?   General: No tenderness or deformity. Normal range of motion.  ?   Cervical back: Normal range of motion.  ?Lymphadenopathy:  ?   Cervical: No cervical adenopathy.  ?Skin: ?   General: Skin is warm and dry.  ?   Findings: No erythema or rash.  ?Neurological:  ?   Mental Status: She is alert and oriented to person, place, and time.  ?Psychiatric:     ?   Behavior: Behavior normal.     ?   Thought Content: Thought content normal.     ?   Judgment: Judgment normal.  ? ? ? ?Lab Results  ?Component Value Date  ? WBC 8.2 07/01/2021  ? HGB 12.1 07/01/2021  ? HCT 38.2 07/01/2021  ? MCV 87.8 07/01/2021  ? PLT 128 (L) 07/01/2021  ? ?Lab Results  ?Component Value Date  ? FERRITIN 352 (H) 11/20/2020  ? IRON 46  11/20/2020  ? TIBC 223 (L) 11/20/2020  ? UIBC 177 11/20/2020  ? IRONPCTSAT 21 11/20/2020  ? ?Lab Results  ?Component Value Date  ? RETICCTPCT 1.5 08/29/2020  ? RBC 4.35 07/01/2021  ? ?No results found for: K

## 2021-07-03 ENCOUNTER — Encounter: Payer: Self-pay | Admitting: Hematology & Oncology

## 2021-07-07 ENCOUNTER — Encounter: Payer: Self-pay | Admitting: *Deleted

## 2021-07-07 LAB — BCR/ABL

## 2021-07-16 ENCOUNTER — Encounter: Payer: Self-pay | Admitting: Hematology & Oncology

## 2021-07-21 ENCOUNTER — Encounter (HOSPITAL_COMMUNITY): Payer: Self-pay | Admitting: Gastroenterology

## 2021-07-22 NOTE — Progress Notes (Signed)
Attempted to obtain medical history via telephone, unable to reach at this time. HIPAA compliant voicemail message left requesting return call to pre surgical testing department. 

## 2021-07-30 ENCOUNTER — Inpatient Hospital Stay: Payer: Medicaid Other

## 2021-07-30 ENCOUNTER — Ambulatory Visit: Payer: Medicaid Other | Admitting: Hematology & Oncology

## 2021-08-03 NOTE — Anesthesia Preprocedure Evaluation (Signed)
Anesthesia Evaluation  Patient identified by MRN, date of birth, ID band Patient awake    Reviewed: Allergy & Precautions, NPO status , Patient's Chart, lab work & pertinent test results  Airway Mallampati: III  TM Distance: >3 FB Neck ROM: Full    Dental no notable dental hx. (+) Teeth Intact, Dental Advisory Given   Pulmonary asthma , COPD,  COPD inhaler and oxygen dependent, Current Smoker,  PRN O2   + rhonchi  + wheezing      Cardiovascular hypertension, Normal cardiovascular exam Rhythm:Regular Rate:Normal  TTE 07/2018 ejection  fraction of 60-65%. The cavity size was normal. There is moderate  concentric left ventricular hypertrophy   Neuro/Psych    GI/Hepatic Neg liver ROS, GERD  ,  Endo/Other  diabetesMorbid obesity (BMI 41)  Renal/GU      Musculoskeletal  (+) Fibromyalgia -  Abdominal (+) + obese,   Peds  Hematology  (+) Blood dyscrasia, anemia , Hx of CML   Anesthesia Other Findings All: Sulfa, dulaglutide, metformin,  Reproductive/Obstetrics                            Anesthesia Physical Anesthesia Plan  ASA: 4  Anesthesia Plan: MAC   Post-op Pain Management:    Induction:   PONV Risk Score and Plan: Treatment may vary due to age or medical condition  Airway Management Planned: Nasal Cannula and Natural Airway  Additional Equipment: None  Intra-op Plan:   Post-operative Plan:   Informed Consent:     Dental advisory given  Plan Discussed with:   Anesthesia Plan Comments: (EGD for GERD and Barrets under MAc)        Anesthesia Quick Evaluation

## 2021-08-04 ENCOUNTER — Ambulatory Visit (HOSPITAL_COMMUNITY)
Admission: RE | Admit: 2021-08-04 | Discharge: 2021-08-04 | Disposition: A | Discharge status: Home / Self Care | Payer: Medicaid Other | Attending: Gastroenterology | Admitting: Gastroenterology

## 2021-08-04 ENCOUNTER — Encounter (HOSPITAL_COMMUNITY): Payer: Self-pay | Admitting: Gastroenterology

## 2021-08-04 ENCOUNTER — Ambulatory Visit (HOSPITAL_COMMUNITY): Payer: Medicaid Other | Admitting: Anesthesiology

## 2021-08-04 ENCOUNTER — Ambulatory Visit (HOSPITAL_BASED_OUTPATIENT_CLINIC_OR_DEPARTMENT_OTHER): Payer: Medicaid Other | Admitting: Anesthesiology

## 2021-08-04 ENCOUNTER — Other Ambulatory Visit: Payer: Self-pay

## 2021-08-04 ENCOUNTER — Encounter (HOSPITAL_COMMUNITY)
Admission: RE | Disposition: A | Discharge status: Home / Self Care | Payer: Self-pay | Source: Home / Self Care | Attending: Gastroenterology

## 2021-08-04 DIAGNOSIS — K3189 Other diseases of stomach and duodenum: Secondary | ICD-10-CM

## 2021-08-04 DIAGNOSIS — F17200 Nicotine dependence, unspecified, uncomplicated: Secondary | ICD-10-CM | POA: Insufficient documentation

## 2021-08-04 DIAGNOSIS — I10 Essential (primary) hypertension: Secondary | ICD-10-CM | POA: Diagnosis not present

## 2021-08-04 DIAGNOSIS — R1319 Other dysphagia: Secondary | ICD-10-CM

## 2021-08-04 DIAGNOSIS — K2970 Gastritis, unspecified, without bleeding: Secondary | ICD-10-CM

## 2021-08-04 DIAGNOSIS — R1310 Dysphagia, unspecified: Secondary | ICD-10-CM | POA: Diagnosis not present

## 2021-08-04 DIAGNOSIS — Z9981 Dependence on supplemental oxygen: Secondary | ICD-10-CM | POA: Diagnosis not present

## 2021-08-04 DIAGNOSIS — K2100 Gastro-esophageal reflux disease with esophagitis, without bleeding: Secondary | ICD-10-CM | POA: Insufficient documentation

## 2021-08-04 DIAGNOSIS — R197 Diarrhea, unspecified: Secondary | ICD-10-CM | POA: Insufficient documentation

## 2021-08-04 DIAGNOSIS — K2270 Barrett's esophagus without dysplasia: Secondary | ICD-10-CM | POA: Insufficient documentation

## 2021-08-04 DIAGNOSIS — K449 Diaphragmatic hernia without obstruction or gangrene: Secondary | ICD-10-CM | POA: Diagnosis not present

## 2021-08-04 DIAGNOSIS — Z6841 Body Mass Index (BMI) 40.0 and over, adult: Secondary | ICD-10-CM | POA: Diagnosis not present

## 2021-08-04 DIAGNOSIS — K219 Gastro-esophageal reflux disease without esophagitis: Secondary | ICD-10-CM | POA: Diagnosis not present

## 2021-08-04 DIAGNOSIS — J449 Chronic obstructive pulmonary disease, unspecified: Secondary | ICD-10-CM | POA: Diagnosis not present

## 2021-08-04 DIAGNOSIS — E119 Type 2 diabetes mellitus without complications: Secondary | ICD-10-CM | POA: Diagnosis not present

## 2021-08-04 DIAGNOSIS — M797 Fibromyalgia: Secondary | ICD-10-CM | POA: Diagnosis not present

## 2021-08-04 HISTORY — PX: BIOPSY: SHX5522

## 2021-08-04 HISTORY — PX: ESOPHAGOGASTRODUODENOSCOPY (EGD) WITH PROPOFOL: SHX5813

## 2021-08-04 HISTORY — PX: SAVORY DILATION: SHX5439

## 2021-08-04 LAB — GLUCOSE, CAPILLARY: Glucose-Capillary: 121 mg/dL — ABNORMAL HIGH (ref 70–99)

## 2021-08-04 SURGERY — ESOPHAGOGASTRODUODENOSCOPY (EGD) WITH PROPOFOL
Anesthesia: Monitor Anesthesia Care

## 2021-08-04 MED ORDER — LACTATED RINGERS IV SOLN: INTRAVENOUS | Status: DC

## 2021-08-04 MED ORDER — PROPOFOL 10 MG/ML IV BOLUS
INTRAVENOUS | Status: DC | PRN
  Administered 2021-08-04: 10 mg via INTRAVENOUS
  Administered 2021-08-04: 20 mg via INTRAVENOUS

## 2021-08-04 MED ORDER — SODIUM CHLORIDE 0.9 % IV SOLN: INTRAVENOUS | Status: DC

## 2021-08-04 MED ORDER — PROPOFOL 500 MG/50ML IV EMUL
INTRAVENOUS | Status: DC | PRN
  Administered 2021-08-04: 130 ug/kg/min via INTRAVENOUS

## 2021-08-04 MED ORDER — PROPOFOL 1000 MG/100ML IV EMUL
INTRAVENOUS | Status: AC
  Filled 2021-08-04: qty 100

## 2021-08-04 SURGICAL SUPPLY — 15 items

## 2021-08-04 NOTE — Interval H&P Note (Signed)
History and Physical Interval Note:  08/04/2021 7:27 AM  Tabitha Estrada  has presented today for surgery, with the diagnosis of GERD, Barrett's Esophagus.  The various methods of treatment have been discussed with the patient and family. After consideration of risks, benefits and other options for treatment, the patient has consented to  Procedure(s): ESOPHAGOGASTRODUODENOSCOPY (EGD) WITH PROPOFOL (N/A) with biopsies and possible esophageal dilation as a surgical intervention.  The patient's history has been reviewed, patient examined, no change in status, stable for surgery.  I have reviewed the patient's chart and labs.  Questions were answered to the patient's satisfaction.     Dominic Pea Shantana Christon

## 2021-08-04 NOTE — Transfer of Care (Signed)
Immediate Anesthesia Transfer of Care Note  Patient: Tabitha Estrada  Procedure(s) Performed: ESOPHAGOGASTRODUODENOSCOPY (EGD) WITH PROPOFOL BIOPSY SAVORY DILATION  Patient Location: PACU  Anesthesia Type:MAC  Level of Consciousness: sedated  Airway & Oxygen Therapy: Patient Spontanous Breathing and Patient connected to face mask oxygen  Post-op Assessment: Report given to RN and Post -op Vital signs reviewed and stable  Post vital signs: Reviewed and stable  Last Vitals:  Vitals Value Taken Time  BP 139/58 08/04/21 0800  Temp    Pulse 86 08/04/21 0802  Resp 11 08/04/21 0802  SpO2 100 % 08/04/21 0802  Vitals shown include unvalidated device data.  Last Pain:  Vitals:   08/04/21 0655  TempSrc: Oral         Complications: No notable events documented.

## 2021-08-04 NOTE — Anesthesia Postprocedure Evaluation (Signed)
Anesthesia Post Note  Patient: Tabitha Estrada  Procedure(s) Performed: ESOPHAGOGASTRODUODENOSCOPY (EGD) WITH PROPOFOL BIOPSY SAVORY DILATION     Patient location during evaluation: Endoscopy Anesthesia Type: MAC Level of consciousness: awake and alert Pain management: pain level controlled Vital Signs Assessment: post-procedure vital signs reviewed and stable Respiratory status: spontaneous breathing, nonlabored ventilation, respiratory function stable and patient connected to nasal cannula oxygen Cardiovascular status: stable and blood pressure returned to baseline Postop Assessment: no apparent nausea or vomiting Anesthetic complications: no   No notable events documented.  Last Vitals:  Vitals:   08/04/21 0655 08/04/21 0802  BP: (!) 148/78 (!) 139/58  Pulse: 70 86  Resp: 14 14  Temp: 36.4 C (!) 36.3 C  SpO2: 95% 100%    Last Pain:  Vitals:   08/04/21 0802  TempSrc: Temporal  PainSc: 0-No pain                 Barnet Glasgow

## 2021-08-04 NOTE — Op Note (Signed)
[facility] Patient Name: Tabitha Estrada Procedure Date: 08/04/2021 MRN: [id number] Attending MD: Gerrit Heck , MD Date of Birth: 12/27/1970 CSN: [id number] Age: 51 Admit Type: Outpatient Procedure:                Upper GI endoscopy Indications:              Dysphagia, Esophageal reflux, Surveillance for                            malignancy due to personal history of Barrett's                            esophagus, Diarrhea Providers:                Gerrit Heck, MD, Elmer Ramp. Tilden Dome, RN, Despina Pole, Technician, Carrollton Alday CRNA, CRNA Referring MD:              Medicines:                Monitored Anesthesia Care Complications:            No immediate complications. Estimated Blood Loss:     Estimated blood loss was minimal. Procedure:                Pre-Anesthesia Assessment:                           - Prior to the procedure, a History and Physical                            was performed, and patient medications and                            allergies were reviewed. The patient's tolerance of                            previous anesthesia was also reviewed. The risks                            and benefits of the procedure and the sedation                            options and risks were discussed with the patient.                            All questions were answered, and informed consent                            was obtained. Prior Anticoagulants: The patient has                            taken no previous anticoagulant or antiplatelet  agents except for aspirin. ASA Grade Assessment: IV                            - A patient with severe systemic disease that is a                            constant threat to life. After reviewing the risks                            and benefits, the patient was deemed in                            satisfactory condition to undergo the procedure.                            After obtaining informed consent, the endoscope was                            passed under direct vision. Throughout the                            procedure, the patient's blood pressure, pulse, and                            oxygen saturations were monitored continuously. The                            [id number]-H190 ([id number]) Olympus endoscope was introduced                            through the mouth, and advanced to the second part                            of duodenum. The upper GI endoscopy was                            accomplished without difficulty. The patient                            tolerated the procedure well. Scope In: Scope Out: Findings:      There were esophageal mucosal changes classified as Barrett's stage       C3-M3 per Prague criteria present in the lower third of the esophagus.       The maximum longitudinal extent of these mucosal changes was 3 cm in       length. Mucosa was biopsied with a cold forceps for histology in 4       quadrants in the lower third of the esophagus. Estimated blood loss was       minimal.      A 1 cm hiatal hernia was present.      The upper third of the esophagus and middle third of the esophagus were       normal. Given symptoms of dysphagia and previous improvement with       esophageal dilation, the decision  was made to proceed with repeat       esophageal dilation today. A guidewire was placed and the scope was       withdrawn. Dilation was performed with a Savary dilator with no       resistance at 17 mm. The dilation site was examined following endoscope       reinsertion and showed no bleeding, mucosal tear or perforation.       Estimated blood loss: none.      Scattered mild inflammation characterized by congestion (edema) and       erythema was found in the gastric fundus, in the gastric body and in the       gastric antrum. Biopsies were taken with a cold forceps for Helicobacter       pylori testing.  Estimated blood loss was minimal.      Localized mildly erythematous mucosa without active bleeding and with no       stigmata of bleeding was found in the duodenal bulb. Biopsies were taken       with a cold forceps for histology. Estimated blood loss was minimal.      The first portion of the duodenum and second portion of the duodenum       were normal. Biopsies were taken with a cold forceps for histology.       Estimated blood loss was minimal. Impression:               - Esophageal mucosal changes classified as                            Barrett's stage C3-M3 per Prague criteria. Biopsied.                           - 1 cm hiatal hernia.                           - Normal upper third of esophagus and middle third                            of esophagus. Dilated with 17 mm Savary dilator.                           - Gastritis. Biopsied.                           - Erythematous duodenopathy. Biopsied.                           - Normal first portion of the duodenum and second                            portion of the duodenum. Biopsied. Moderate Sedation:      Not Applicable - Patient had care per Anesthesia. Recommendation:           - Patient has a contact number available for                            emergencies. The signs and symptoms of potential  delayed complications were discussed with the                            patient. Return to normal activities tomorrow.                            Written discharge instructions were provided to the                            patient.                           - Resume previous diet.                           - Continue present medications.                           - Await pathology results.                           - Repeat upper endoscopy in 3 - 5 years for                            surveillance based on pathology results.                           - Return to GI clinic PRN. Procedure Code(s):         --- Professional ---                           [phone], Esophagogastroduodenoscopy, flexible,                            transoral; with insertion of guide wire followed by                            passage of dilator(s) through esophagus over guide                            wire                           43239, 7, Esophagogastroduodenoscopy, flexible,                            transoral; with biopsy, single or multiple Diagnosis Code(s):        --- Professional ---                           K22.70, Barrett's esophagus without dysplasia                           K44.9, Diaphragmatic hernia without obstruction or                            gangrene  K29.70, Gastritis, unspecified, without bleeding                           K31.89, Other diseases of stomach and duodenum                           R13.10, Dysphagia, unspecified                           K21.9, Gastro-esophageal reflux disease without                            esophagitis                           R19.7, Diarrhea, unspecified CPT copyright 2019 American Medical Association. All rights reserved. The codes documented in this report are preliminary and upon coder review may  be revised to meet current compliance requirements. Gerrit Heck, MD 08/04/2021 8:10:12 AM Number of Addenda: 0

## 2021-08-04 NOTE — Discharge Instructions (Signed)
YOU HAD AN ENDOSCOPIC PROCEDURE TODAY: Refer to the procedure report and other information in the discharge instructions given to you for any specific questions about what was found during the examination. If this information does not answer your questions, please call [place] office at [phone] to clarify.   YOU SHOULD EXPECT: Some feelings of bloating in the abdomen. Passage of more gas than usual. Walking can help get rid of the air that was put into your GI tract during the procedure and reduce the bloating. If you had a lower endoscopy (such as a colonoscopy or flexible sigmoidoscopy) you may notice spotting of blood in your stool or on the toilet paper. Some abdominal soreness may be present for a day or two, also.  DIET: Your first meal following the procedure should be a light meal and then it is ok to progress to your normal diet. A half-sandwich or bowl of soup is an example of a good first meal. Heavy or fried foods are harder to digest and may make you feel nauseous or bloated. Drink plenty of fluids but you should avoid alcoholic beverages for 24 hours. If you had a esophageal dilation, please see attached instructions for diet.    ACTIVITY: Your care partner should take you home directly after the procedure. You should plan to take it easy, moving slowly for the rest of the day. You can resume normal activity the day after the procedure however YOU SHOULD NOT DRIVE, use power tools, machinery or perform tasks that involve climbing or major physical exertion for 24 hours (because of the sedation medicines used during the test).   SYMPTOMS TO REPORT IMMEDIATELY: A gastroenterologist can be reached at any hour. Please call [phone]  for any of the following symptoms:   Following upper endoscopy (EGD, EUS, ERCP, esophageal dilation) Vomiting of blood or coffee ground material  New, significant abdominal pain  New, significant chest pain or pain under the shoulder blades  Painful or  persistently difficult swallowing  New shortness of breath  Black, tarry-looking or red, bloody stools  FOLLOW UP:  If any biopsies were taken you will be contacted by phone or by letter within the next 1-3 weeks. Call [phone]  if you have not heard about the biopsies in 3 weeks.  Please also call with any specific questions about appointments or follow up tests.  

## 2021-08-04 NOTE — H&P (Signed)
GASTROENTEROLOGY PROCEDURE H&P NOTE   Primary Care Physician: Pcp, No    Reason for Procedure:  GERD, nondysplastic Barrett's Esophagus, dysphagia, diarrhea  Plan:    EGD with biopsies and possible esophageal dilation  Due to elevated risks 2/2 underlying significant comorbidities, endoscopic procedure(s) scheduled today at [facility] Endoscopy unit.  The nature of the procedure, as well as the risks, benefits, and alternatives were carefully and thoroughly reviewed with the patient. Ample time for discussion and questions allowed. The patient understood, was satisfied, and agreed to proceed.     HPI: Tabitha Estrada is a 51 y.o. female who presents for EGD for evaluation of GERD Barrett's Esophagus, dysphagia, and diarrhea.  No longer taking Plavix.  Now taking ASA 81 mg/day.   Endoscopic history: - Colonoscopy (04/2016): Diverticulosis, internal hemorrhoids - EGD (04/2018) 3 cm segment of nondysplastic Barrett's Esophagus.  Empiric dilation with 56 French Maloney dilator - EGD (11/2018): Nondysplastic Barrett's Esophagus.  Empirically dilated with 56 French Maloney dilator - Colonoscopy (11/2018): Normal.  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   COPD (chronic obstructive pulmonary disease) ([place])    Depression    Diabetes mellitus without complication (HCC)    Dyspnea    with exertion and bronchititis   Fibromyalgia    GERD (gastroesophageal reflux disease)    History of hiatal hernia    History of kidney stones    Hyperlipidemia    Neuromuscular disorder ([place])    Pneumonia    hs, 01-19-15   Sleep apnea    has Cpap have not been using    Past Surgical History:  Procedure Laterality Date   BACK SURGERY  2015,2016, 2018   CARPAL TUNNEL RELEASE Right 2008   [place]  2015    TONSILLECTOMY  1979   TUBAL LIGATION  2005    Prior to Admission medications   Medication Sig Start Date End Date Taking? Authorizing Provider  acetaminophen (TYLENOL) 500 MG tablet Take 1,000 mg by mouth every 6 (six) hours as needed for moderate pain.   Yes [provider]  albuterol (PROVENTIL HFA;VENTOLIN HFA) 108 (90 Base) MCG/ACT inhaler Inhale 1-2 puffs into the lungs every 4 (four) hours as needed for wheezing or shortness of breath. 12/24/15  Yes Emeterio Reeve, DO  albuterol (PROVENTIL) (2.5 MG/3ML) 0.083% nebulizer solution Inhale the contents of one vial in nebulizer every four to six hours as needed for cough or wheeze. 11/09/16  Yes Kozlow, Donnamarie Poag, MD  ASPIRIN LOW DOSE 81 MG EC tablet Take 81 mg by mouth daily. 04/18/20  Yes [provider]  atorvastatin (LIPITOR) 80 MG tablet TAKE 1 TABLET (80 MG TOTAL) BY MOUTH DAILY. NEEDS LAB WORK 03/01/19  Yes Emeterio Reeve, DO  budesonide-formoterol [facility]) 160-4.5 MCG/ACT inhaler Inhale 2 puffs into the lungs 2 (two) times daily. 11/01/17  Yes Silverio Decamp, MD  clindamycin (CLEOCIN) 300 MG capsule Take 300 mg by mouth 3 (three) times daily.   Yes [provider]  cyclobenzaprine (FLEXERIL) 10 MG tablet Take 10 mg by mouth daily as needed for muscle spasms. 09/18/19  Yes [provider]  diphenoxylate-atropine (LOMOTIL) 2.5-0.025 MG tablet Take 1 tablet by mouth 4 (four) times daily as needed for diarrhea or loose stools (as  needed after taking Sprycel for diarrhea). 05/07/21  Yes Ennever, Rudell Cobb, MD  folic acid (FOLVITE) 1 MG tablet Take 1 mg by mouth daily. 12/13/20  Yes [provider]  insulin aspart (NOVOLOG FLEXPEN) 100 UNIT/ML FlexPen Inject 40 Units into the skin daily with supper. Patient taking differently: Inject 15-40 Units into the skin See admin instructions. Use in omnipod insulin pump, administers 3 doses per day 07/08/17  Yes Renato Shin, MD  meclizine (ANTIVERT) 25  MG tablet Take 1 tablet (25 mg total) by mouth 3 (three) times daily as needed for dizziness. 05/07/21  Yes [name], MD  montelukast (SINGULAIR) 10 MG tablet TAKE 1 TABLET (10 MG TOTAL) BY MOUTH DAILY 11/03/18  Yes Emeterio Reeve, DO  mupirocin ointment (BACTROBAN) 2 % Apply 1 application. topically daily. 07/21/21  Yes [provider]  omeprazole (PRILOSEC) 40 MG capsule Take 40 mg by mouth 2 (two) times daily.   Yes [provider]  ondansetron (ZOFRAN-ODT) 4 MG disintegrating tablet Take 4 mg by mouth every 8 (eight) hours as needed for nausea or vomiting.   Yes [provider]  ponatinib HCl (ICLUSIG) 15 MG tablet Take 1 tablet (15 mg total) by mouth daily. 05/06/21  Yes Ennever, Rudell Cobb, MD  potassium chloride (KLOR-CON) 20 MEQ packet Take 20 mEq by mouth daily.   Yes [provider]  pregabalin (LYRICA) 200 MG capsule Take 200 mg by mouth 2 (two) times daily. 12/12/20  Yes [provider]  torsemide (DEMADEX) 20 MG tablet Take 20 mg by mouth daily. 12/17/19  Yes [provider]  valACYclovir (VALTREX) 500 MG tablet TAKE ONE TABLET BY MOUTH AT BEDTIME 09/04/18  Yes Emeterio Reeve, DO  valsartan (DIOVAN) 40 MG tablet Take 40 mg by mouth daily. 01/10/21  Yes [provider]  vitamin B-12 (CYANOCOBALAMIN) 1000 MCG tablet Take 1,000 mcg by mouth daily.   Yes [provider]  BAQSIMI ONE PACK 3 MG/DOSE POWD Place 1 spray into both nostrils as needed for low blood sugar. 07/09/21   [provider]  colestipol (COLESTID) 1 g tablet Take 1 tablet (1 g total) by mouth every morning. Patient not taking: Reported on 07/30/2021 03/20/21   Matisse Salais V, DO  Continuous Blood Gluc Sensor (DEXCOM G6 SENSOR) MISC Apply topically. 12/16/19   [provider]  fluticasone (FLONASE) 50 MCG/ACT nasal spray Use two sprays in each nostril once daily as directed. Patient taking differently: Place 2 sprays into both  nostrils daily as needed for allergies. 03/28/17   Collene Gobble, MD  ibuprofen (ADVIL) 200 MG tablet Take 600 mg by mouth every 6 (six) hours as needed for moderate pain.    [provider]  OXYGEN Inhale into the lungs. Pt reports 2-3 lpm continuous.    [provider]  potassium chloride SA (KLOR-CON) 20 MEQ tablet Take 1 tablet (20 mEq total) by mouth daily. Patient not taking: Reported on 07/30/2021 11/20/20   [name], MD    Current Facility-Administered Medications  Medication Dose Route Frequency Provider Last Rate Last Admin   0.9 %  sodium chloride infusion   Intravenous Continuous Erynn Vaca V, DO       lactated ringers infusion   Intravenous Continuous Ziad Maye V, DO 10 mL/hr at 08/04/21 3785 New Bag at 08/04/21 8850    Allergies as of 06/04/2021 - Review Complete 05/06/2021  Allergen Reaction Noted   Dulaglutide Nausea And Vomiting and Other (See Comments) 12/17/2015  Elemental sulfur Nausea And Vomiting 10/05/2010   Liraglutide Nausea And Vomiting 10/28/2019   Metformin Diarrhea 06/04/2013   Sulfamethoxazole Nausea And Vomiting 02/04/2011    Family History  Problem Relation Age of Onset   Diabetes Father    Hyperlipidemia Father    Hypertension Father    Diabetes Paternal Aunt    Hypertension Paternal Aunt    Heart attack Paternal Uncle    Emphysema Maternal Grandmother    Heart failure Maternal Grandmother    Cancer Maternal Grandfather    Colon cancer Neg Hx     Social History   Socioeconomic History   Marital status: Married    Spouse name: Not on file   Number of children: Not on file   Years of education: Not on file   Highest education level: Not on file  Occupational History   Not on file  Tobacco Use   Smoking status: Every Day    Packs/day: 1.00    Years: 35.00    Pack years: 35.00    Types: Cigarettes   Smokeless tobacco: Never   Tobacco comments:    Wellbutrin started 07/25/2019  Vaping Use    Vaping Use: Never used  Substance and Sexual Activity   Alcohol use: No   Drug use: No   Sexual activity: Yes    Birth control/protection: Surgical  Other Topics Concern   Not on file  Social History Narrative   Not on file   Social Determinants of Health   Financial Resource Strain: Not on file  Food Insecurity: Not on file  Transportation Needs: Not on file  Physical Activity: Not on file  Stress: Not on file  Social Connections: Not on file  Intimate Partner Violence: Not on file    Physical Exam: Vital signs in last 24 hours: '@BP'$  (!) 148/78   Pulse 70   Temp 97.6 F (36.4 C) (Oral)   Resp 14   Ht '5\' 5"'$  (1.651 m)   Wt 111.6 kg   LMP 11/17/2016 (Approximate)   SpO2 95%   BMI 40.94 kg/m  GEN: NAD EYE: Sclerae anicteric ENT: MMM CV: Non-tachycardic Pulm: CTA b/l GI: Soft, NT/ND NEURO:  Alert & Oriented x 3   Gerrit Heck, DO [facility]   08/04/2021 7:20 AM

## 2021-08-05 ENCOUNTER — Other Ambulatory Visit (HOSPITAL_COMMUNITY): Payer: Self-pay

## 2021-08-05 ENCOUNTER — Encounter (HOSPITAL_COMMUNITY): Payer: Self-pay | Admitting: Gastroenterology

## 2021-08-05 LAB — SURGICAL PATHOLOGY

## 2021-08-10 ENCOUNTER — Encounter: Payer: Self-pay | Admitting: Hematology & Oncology

## 2021-09-02 ENCOUNTER — Inpatient Hospital Stay (HOSPITAL_BASED_OUTPATIENT_CLINIC_OR_DEPARTMENT_OTHER): Payer: Medicaid Other | Admitting: Hematology & Oncology

## 2021-09-02 ENCOUNTER — Inpatient Hospital Stay: Payer: Medicaid Other | Attending: Hematology & Oncology

## 2021-09-02 ENCOUNTER — Encounter: Payer: Self-pay | Admitting: Hematology & Oncology

## 2021-09-02 VITALS — BP 120/63 | HR 77 | Temp 98.2°F | Resp 19 | Wt 247.0 lb

## 2021-09-02 DIAGNOSIS — D509 Iron deficiency anemia, unspecified: Secondary | ICD-10-CM | POA: Insufficient documentation

## 2021-09-02 DIAGNOSIS — C9210 Chronic myeloid leukemia, BCR/ABL-positive, not having achieved remission: Secondary | ICD-10-CM | POA: Diagnosis not present

## 2021-09-02 DIAGNOSIS — E119 Type 2 diabetes mellitus without complications: Secondary | ICD-10-CM | POA: Insufficient documentation

## 2021-09-02 DIAGNOSIS — R197 Diarrhea, unspecified: Secondary | ICD-10-CM | POA: Diagnosis not present

## 2021-09-02 DIAGNOSIS — F17200 Nicotine dependence, unspecified, uncomplicated: Secondary | ICD-10-CM | POA: Diagnosis not present

## 2021-09-02 DIAGNOSIS — L03311 Cellulitis of abdominal wall: Secondary | ICD-10-CM | POA: Diagnosis not present

## 2021-09-02 DIAGNOSIS — C9310 Chronic myelomonocytic leukemia not having achieved remission: Secondary | ICD-10-CM | POA: Insufficient documentation

## 2021-09-02 LAB — CMP (CANCER CENTER ONLY)
ALT: 15 U/L (ref 0–44)
AST: 14 U/L — ABNORMAL LOW (ref 15–41)
Albumin: 3.6 g/dL (ref 3.5–5.0)
Alkaline Phosphatase: 122 U/L (ref 38–126)
Anion gap: 9 (ref 5–15)
BUN: 18 mg/dL (ref 6–20)
CO2: 30 mmol/L (ref 22–32)
Calcium: 9.4 mg/dL (ref 8.9–10.3)
Chloride: 101 mmol/L (ref 98–111)
Creatinine: 1.1 mg/dL — ABNORMAL HIGH (ref 0.44–1.00)
GFR, Estimated: >60 mL/min (ref >60)
Glucose, Bld: 198 mg/dL — ABNORMAL HIGH (ref 70–99)
Potassium: 3.6 mmol/L (ref 3.5–5.1)
Sodium: 140 mmol/L (ref 135–145)
Total Bilirubin: 0.5 mg/dL (ref 0.3–1.2)
Total Protein: 7.2 g/dL (ref 6.5–8.1)

## 2021-09-02 LAB — CBC WITH DIFFERENTIAL (CANCER CENTER ONLY)
Abs Immature Granulocytes: 0.04 10*3/uL (ref 0.00–0.07)
Basophils Absolute: 0.1 10*3/uL (ref 0.0–0.1)
Basophils Relative: 0 %
Eosinophils Absolute: 0.1 10*3/uL (ref 0.0–0.5)
Eosinophils Relative: 1 %
HCT: 38.6 % (ref 36.0–46.0)
Hemoglobin: 12.9 g/dL (ref 12.0–15.0)
Immature Granulocytes: 0 %
Lymphocytes Relative: 15 %
Lymphs Abs: 1.6 10*3/uL (ref 0.7–4.0)
MCH: 27.9 pg (ref 26.0–34.0)
MCHC: 33.4 g/dL (ref 30.0–36.0)
MCV: 83.5 fL (ref 80.0–100.0)
Monocytes Absolute: 0.4 10*3/uL (ref 0.1–1.0)
Monocytes Relative: 3 %
Neutro Abs: 9.1 10*3/uL — ABNORMAL HIGH (ref 1.7–7.7)
Neutrophils Relative %: 81 %
Platelet Count: 150 10*3/uL (ref 150–400)
RBC: 4.62 MIL/uL (ref 3.87–5.11)
RDW: 15.1 % (ref 11.5–15.5)
WBC Count: 11.2 10*3/uL — ABNORMAL HIGH (ref 4.0–10.5)
nRBC: 0 % (ref 0.0–0.2)

## 2021-09-02 LAB — LACTATE DEHYDROGENASE: LDH: 198 U/L — ABNORMAL HIGH (ref 98–192)

## 2021-09-02 LAB — SAVE SMEAR(SSMR), FOR PROVIDER SLIDE REVIEW

## 2021-09-02 NOTE — Progress Notes (Signed)
Hematology and Oncology Follow Up Visit  Tabitha Estrada [id number] 03-26-1970 51 y.o. 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    Past Therapy: Gleevec 400 mg po q day -- D/C'd on 06/15/2018 for periorbital edema  Sprycel 100 mg PO every other day (changed 08/04/2018) - d/c on 05/01/2019 Bosulif 200 mg po q day -- start on 05/07/2019 - patient stopped 06/26/2019 due to diarrhea    Current Therapy:        Tasigna 50 mg po q day -- start in 11/2019   -- d/c on 04/09/2020 Scemblix  20 mg po day -- start on 02/19/2021 --DC due to intolerance Hydrea 500 mg po BID -- start on 04/02/2021 Ponatinib 15 mg po q day -- start on 05/08/2021 -- hold on 06/15/2021 - restart on 08/12/2021   Interim History:  Tabitha Estrada is here today for follow-up.  She actually looks better than I would have thought.  She actually has restarted the ponatinib.  She is Venofer a couple weeks.  She is having some diarrhea.  However, the Lomotil seems to help.  When we last saw her in May, surprisingly, her BCR/ABL was down to 62%.  She has been having wound issues.  This is from her diabetes.  Her main problem right now is this wound over on the right lower abdominal wall.  I think she sees the doctor for this next week.  The cellulitis might be some kind of localized abscess.  She is still dealing with her diabetes.  Today, her blood sugar was 198.  She is still smoking.  She is trying to cut back.  There has been no rashes.  She has had no bleeding.  There is been no cough or shortness of breath.  Overall, her performance status is probably ECOG 1.  Medications:  Allergies as of 09/02/2021       Reactions   Dulaglutide Nausea And Vomiting, Other (See Comments)   Elemental Sulfur Nausea And Vomiting   Liraglutide Nausea And Vomiting   Metformin Diarrhea   Sulfamethoxazole Nausea And Vomiting        Medication List        Accurate as of September 02, 2021  3:31 PM. If you  have any questions, ask your nurse or doctor.          acetaminophen 500 MG tablet Commonly known as: TYLENOL Take 1,000 mg by mouth every 6 (six) hours as needed for moderate pain.   albuterol 108 (90 Base) MCG/ACT inhaler Commonly known as: VENTOLIN HFA Inhale 1-2 puffs into the lungs every 4 (four) hours as needed for wheezing or shortness of breath.   albuterol (2.5 MG/3ML) 0.083% nebulizer solution Commonly known as: PROVENTIL Inhale the contents of one vial in nebulizer every four to six hours as needed for cough or wheeze.   Aspirin Low Dose 81 MG tablet Generic drug: aspirin EC Take 81 mg by mouth daily.   atorvastatin 80 MG tablet Commonly known as: LIPITOR TAKE 1 TABLET (80 MG TOTAL) BY MOUTH DAILY. NEEDS LAB WORK   Baqsimi One Pack 3 MG/DOSE Powd Generic drug: Glucagon Place 1 spray into both nostrils as needed for low blood sugar.   budesonide-formoterol 160-4.5 MCG/ACT inhaler Commonly known as: SYMBICORT Inhale 2 puffs into the lungs 2 (two) times daily.   clindamycin 300 MG capsule Commonly known as: CLEOCIN Take 300 mg by mouth 3 (three) times daily.   colestipol 1 g tablet  Commonly known as: COLESTID Take 1 tablet (1 g total) by mouth every morning.   cyclobenzaprine 10 MG tablet Commonly known as: FLEXERIL Take 10 mg by mouth daily as needed for muscle spasms.   Dexcom G6 Sensor Misc Apply topically.   diphenoxylate-atropine 2.5-0.025 MG tablet Commonly known as: LOMOTIL Take 1 tablet by mouth 4 (four) times daily as needed for diarrhea or loose stools (as needed after taking Sprycel for diarrhea).   fluticasone 50 MCG/ACT nasal spray Commonly known as: FLONASE Use two sprays in each nostril once daily as directed. What changed:  how much to take how to take this when to take this reasons to take this additional instructions   folic acid 1 MG tablet Commonly known as: FOLVITE Take 1 mg by mouth daily.   ibuprofen 200 MG  tablet Commonly known as: ADVIL Take 600 mg by mouth every 6 (six) hours as needed for moderate pain.   insulin aspart 100 UNIT/ML FlexPen Commonly known as: NovoLOG FlexPen Inject 40 Units into the skin daily with supper. What changed:  how much to take when to take this additional instructions   meclizine 25 MG tablet Commonly known as: ANTIVERT Take 1 tablet (25 mg total) by mouth 3 (three) times daily as needed for dizziness.   montelukast 10 MG tablet Commonly known as: SINGULAIR TAKE 1 TABLET (10 MG TOTAL) BY MOUTH DAILY   mupirocin ointment 2 % Commonly known as: BACTROBAN Apply 1 application. topically daily.   omeprazole 40 MG capsule Commonly known as: PRILOSEC Take 40 mg by mouth 2 (two) times daily.   ondansetron 4 MG disintegrating tablet Commonly known as: ZOFRAN-ODT Take 4 mg by mouth every 8 (eight) hours as needed for nausea or vomiting.   OXYGEN Inhale into the lungs. Pt reports 2-3 lpm continuous.   PONATinib HCl 15 MG tablet Commonly known as: ICLUSIG Take 1 tablet (15 mg total) by mouth daily.   potassium chloride 20 MEQ packet Commonly known as: KLOR-CON Take 20 mEq by mouth daily.   potassium chloride SA 20 MEQ tablet Commonly known as: KLOR-CON M Take 1 tablet (20 mEq total) by mouth daily.   pregabalin 200 MG capsule Commonly known as: LYRICA Take 200 mg by mouth 2 (two) times daily.   torsemide 20 MG tablet Commonly known as: DEMADEX Take 20 mg by mouth daily.   valACYclovir 500 MG tablet Commonly known as: VALTREX TAKE ONE TABLET BY MOUTH AT BEDTIME   valsartan 40 MG tablet Commonly known as: DIOVAN Take 40 mg by mouth daily.   vitamin B-12 1000 MCG tablet Commonly known as: CYANOCOBALAMIN Take 1,000 mcg by mouth daily.        Allergies:  Allergies  Allergen Reactions   Dulaglutide Nausea And Vomiting and Other (See Comments)   Elemental Sulfur Nausea And Vomiting   Liraglutide Nausea And Vomiting   Metformin  Diarrhea   Sulfamethoxazole Nausea And Vomiting    Past Medical History, Surgical history, Social history, and Family History were reviewed and updated.  Review of Systems: Review of Systems  Constitutional: Negative.   Eyes: Negative.   Respiratory:  Positive for cough.   Cardiovascular:  Positive for palpitations.  Gastrointestinal:  Positive for diarrhea.  Genitourinary: Negative.   Musculoskeletal:  Positive for back pain and neck pain.  Skin:  Positive for rash.  Neurological:  Positive for headaches.  Endo/Heme/Allergies: Negative.   Psychiatric/Behavioral: Negative.       Physical Exam:  weight is 247 lb (112  kg). Her oral temperature is 98.2 F (36.8 C). Her blood pressure is 120/63 and her pulse is 77. Her respiration is 19 and oxygen saturation is 95%.   Wt Readings from Last 3 Encounters:  09/02/21 247 lb (112 kg)  08/04/21 246 lb (111.6 kg)  07/01/21 248 lb (112.5 kg)  Her vital signs are temperature of 97.9.  Pulse 81.  Blood pressure 118/58.  Weight is 269 pounds.  Physical Exam Vitals reviewed.  HENT:     Head: Normocephalic and atraumatic.  Eyes:     Pupils: Pupils are equal, round, and reactive to light.  Cardiovascular:     Rate and Rhythm: Normal rate and regular rhythm.     Heart sounds: Normal heart sounds.  Pulmonary:     Effort: Pulmonary effort is normal.     Breath sounds: Normal breath sounds.  Abdominal:     General: Bowel sounds are normal.     Palpations: Abdomen is soft.  Musculoskeletal:        General: No tenderness or deformity. Normal range of motion.     Cervical back: Normal range of motion.  Lymphadenopathy:     Cervical: No cervical adenopathy.  Skin:    General: Skin is warm and dry.     Findings: No erythema or rash.  Neurological:     Mental Status: She is alert and oriented to person, place, and time.  Psychiatric:        Behavior: Behavior normal.        Thought Content: Thought content normal.        Judgment:  Judgment normal.      Lab Results  Component Value Date   WBC 11.2 (H) 09/02/2021   HGB 12.9 09/02/2021   HCT 38.6 09/02/2021   MCV 83.5 09/02/2021   PLT 150 09/02/2021   Lab Results  Component Value Date   FERRITIN 352 (H) 11/20/2020   IRON 46 11/20/2020   TIBC 223 (L) 11/20/2020   UIBC 177 11/20/2020   IRONPCTSAT 21 11/20/2020   Lab Results  Component Value Date   RETICCTPCT 1.5 08/29/2020   RBC 4.62 09/02/2021   No results found for: "KPAFRELGTCHN", "LAMBDASER", "KAPLAMBRATIO" Lab Results  Component Value Date   IGGSERUM 675 (L) 11/10/2016   IGMSERUM 86 11/10/2016   No results found for: "TOTALPROTELP", "ALBUMINELP", "A1GS", "A2GS", "BETS", "BETA2SER", "GAMS", "MSPIKE", "SPEI"   Chemistry      Component Value Date/Time   NA 140 09/02/2021 1444   NA 144 01/27/2017 1302   NA 138 09/16/2016 1105   K 3.6 09/02/2021 1444   K 4.3 01/27/2017 1302   K 4.2 09/16/2016 1105   CL 101 09/02/2021 1444   CL 102 01/27/2017 1302   CO2 30 09/02/2021 1444   CO2 27 01/27/2017 1302   CO2 26 09/16/2016 1105   BUN 18 09/02/2021 1444   BUN 14 01/27/2017 1302   BUN 11.9 09/16/2016 1105   CREATININE 1.10 (H) 09/02/2021 1444   CREATININE 1.1 01/27/2017 1302   CREATININE 0.8 09/16/2016 1105      Component Value Date/Time   CALCIUM 9.4 09/02/2021 1444   CALCIUM 9.5 01/27/2017 1302   CALCIUM 9.0 09/16/2016 1105   ALKPHOS 122 09/02/2021 1444   ALKPHOS 116 (H) 01/27/2017 1302   ALKPHOS 136 09/16/2016 1105   AST 14 (L) 09/02/2021 1444   AST 8 09/16/2016 1105   ALT 15 09/02/2021 1444   ALT 25 01/27/2017 1302   ALT 12 09/16/2016 1105  BILITOT 0.5 09/02/2021 1444   BILITOT 0.32 09/16/2016 1105       Impression and Plan: Tabitha Estrada is a very pleasant 51 yo with chronic phase CML.  She really has had a hard time with all of the TKI medications.  Hopefully, the low-dose ponatinib will agree with her.  It sounds like she is doing okay with that right now.  The diarrhea is being  managed with Lomotil.  We will be interesting to see what the BCR/ABL is.  I am just happy that her quality of life seems to be doing a little bit better.  It would be nice if she was smoking.  Again she is trying to do this slowly.  We will plan to get her back to see [place] now in about 6 weeks.  I think was like to follow her relatively closely because of the diarrhea issues.   [name], MD 7/5/20233:31 PM

## 2021-09-09 LAB — BCR/ABL

## 2021-09-10 ENCOUNTER — Telehealth: Payer: Self-pay

## 2021-09-10 NOTE — Telephone Encounter (Signed)
-----   Message from [name], MD sent at 09/09/2021  9:09 PM EDT ----- Call - the Iclusig is working!!!  The CML is getting better!!   It is not as active.  Tabitha Estrada

## 2021-09-17 ENCOUNTER — Encounter: Payer: Self-pay | Admitting: Hematology & Oncology

## 2021-09-18 ENCOUNTER — Other Ambulatory Visit (HOSPITAL_COMMUNITY): Payer: Self-pay

## 2021-09-18 ENCOUNTER — Other Ambulatory Visit: Payer: Self-pay | Admitting: *Deleted

## 2021-09-18 DIAGNOSIS — C9210 Chronic myeloid leukemia, BCR/ABL-positive, not having achieved remission: Secondary | ICD-10-CM

## 2021-09-21 ENCOUNTER — Inpatient Hospital Stay: Payer: Medicaid Other

## 2021-09-21 DIAGNOSIS — C9310 Chronic myelomonocytic leukemia not having achieved remission: Secondary | ICD-10-CM | POA: Diagnosis not present

## 2021-09-21 DIAGNOSIS — C9210 Chronic myeloid leukemia, BCR/ABL-positive, not having achieved remission: Secondary | ICD-10-CM

## 2021-09-21 LAB — CBC WITH DIFFERENTIAL (CANCER CENTER ONLY)
Abs Immature Granulocytes: 0.04 10*3/uL (ref 0.00–0.07)
Basophils Absolute: 0 10*3/uL (ref 0.0–0.1)
Basophils Relative: 0 %
Eosinophils Absolute: 0.1 10*3/uL (ref 0.0–0.5)
Eosinophils Relative: 1 %
HCT: 40.9 % (ref 36.0–46.0)
Hemoglobin: 13.2 g/dL (ref 12.0–15.0)
Immature Granulocytes: 0 %
Lymphocytes Relative: 19 %
Lymphs Abs: 1.9 10*3/uL (ref 0.7–4.0)
MCH: 27.6 pg (ref 26.0–34.0)
MCHC: 32.3 g/dL (ref 30.0–36.0)
MCV: 85.4 fL (ref 80.0–100.0)
Monocytes Absolute: 0.4 10*3/uL (ref 0.1–1.0)
Monocytes Relative: 4 %
Neutro Abs: 7.6 10*3/uL (ref 1.7–7.7)
Neutrophils Relative %: 76 %
Platelet Count: 163 10*3/uL (ref 150–400)
RBC: 4.79 MIL/uL (ref 3.87–5.11)
RDW: 15.9 % — ABNORMAL HIGH (ref 11.5–15.5)
WBC Count: 10 10*3/uL (ref 4.0–10.5)
nRBC: 0 % (ref 0.0–0.2)

## 2021-10-14 ENCOUNTER — Ambulatory Visit: Payer: Medicaid Other | Admitting: Hematology & Oncology

## 2021-10-14 ENCOUNTER — Inpatient Hospital Stay: Payer: Medicaid Other

## 2021-10-23 ENCOUNTER — Encounter: Payer: Self-pay | Admitting: Hematology & Oncology

## 2021-11-03 ENCOUNTER — Telehealth: Payer: Self-pay

## 2021-11-03 NOTE — Telephone Encounter (Signed)
Pt called stating she has covid and needs permisson to stop her oral chemo to start the covid tx. Discussed with MD who confirmed pt can stop her ponatinib. Called patient and informed her. She verbalized understanding and will call back when her tx is done to see about restarting her medication

## 2021-11-12 ENCOUNTER — Telehealth: Payer: Self-pay | Admitting: *Deleted

## 2021-11-12 NOTE — Telephone Encounter (Signed)
Call received from Case Manager from pt.'s insurance company to inform Dr. Marin Olp that pt was seen by her PCP for chest pain and an EKG was done which was negative.  Dr. Marin Olp notified.  No new orders received.

## 2021-11-19 ENCOUNTER — Encounter: Payer: Self-pay | Admitting: Hematology & Oncology

## 2021-11-19 ENCOUNTER — Inpatient Hospital Stay: Payer: Medicaid Other

## 2021-11-19 ENCOUNTER — Inpatient Hospital Stay: Payer: Medicaid Other | Attending: Hematology & Oncology | Admitting: Hematology & Oncology

## 2021-11-19 ENCOUNTER — Other Ambulatory Visit: Payer: Self-pay

## 2021-11-19 ENCOUNTER — Telehealth: Payer: Self-pay | Admitting: *Deleted

## 2021-11-19 VITALS — BP 118/77 | HR 72 | Temp 98.1°F | Resp 18 | Wt 239.0 lb

## 2021-11-19 DIAGNOSIS — C9310 Chronic myelomonocytic leukemia not having achieved remission: Secondary | ICD-10-CM | POA: Insufficient documentation

## 2021-11-19 DIAGNOSIS — D509 Iron deficiency anemia, unspecified: Secondary | ICD-10-CM | POA: Insufficient documentation

## 2021-11-19 DIAGNOSIS — C9210 Chronic myeloid leukemia, BCR/ABL-positive, not having achieved remission: Secondary | ICD-10-CM

## 2021-11-19 DIAGNOSIS — F17210 Nicotine dependence, cigarettes, uncomplicated: Secondary | ICD-10-CM | POA: Insufficient documentation

## 2021-11-19 DIAGNOSIS — E119 Type 2 diabetes mellitus without complications: Secondary | ICD-10-CM | POA: Diagnosis not present

## 2021-11-19 LAB — CMP (CANCER CENTER ONLY)
ALT: 12 U/L (ref 0–44)
AST: 8 U/L — ABNORMAL LOW (ref 15–41)
Albumin: 4.1 g/dL (ref 3.5–5.0)
Alkaline Phosphatase: 143 U/L — ABNORMAL HIGH (ref 38–126)
Anion gap: 8 (ref 5–15)
BUN: 20 mg/dL (ref 6–20)
CO2: 33 mmol/L — ABNORMAL HIGH (ref 22–32)
Calcium: 9.7 mg/dL (ref 8.9–10.3)
Chloride: 102 mmol/L (ref 98–111)
Creatinine: 1.2 mg/dL — ABNORMAL HIGH (ref 0.44–1.00)
GFR, Estimated: 55 mL/min — ABNORMAL LOW (ref >60)
Glucose, Bld: 213 mg/dL — ABNORMAL HIGH (ref 70–99)
Potassium: 4.2 mmol/L (ref 3.5–5.1)
Sodium: 143 mmol/L (ref 135–145)
Total Bilirubin: 0.6 mg/dL (ref 0.3–1.2)
Total Protein: 7.2 g/dL (ref 6.5–8.1)

## 2021-11-19 LAB — CBC WITH DIFFERENTIAL (CANCER CENTER ONLY)
Abs Immature Granulocytes: 0 10*3/uL (ref 0.00–0.07)
Band Neutrophils: 0 %
Basophils Absolute: 0 10*3/uL (ref 0.0–0.1)
Basophils Relative: 0 %
Blasts: 0 %
Eosinophils Absolute: 0.2 10*3/uL (ref 0.0–0.5)
Eosinophils Relative: 2 %
HCT: 42.8 % (ref 36.0–46.0)
Hemoglobin: 13.8 g/dL (ref 12.0–15.0)
Lymphocytes Relative: 24 %
Lymphs Abs: 2.3 10*3/uL (ref 0.7–4.0)
MCH: 28.5 pg (ref 26.0–34.0)
MCHC: 32.2 g/dL (ref 30.0–36.0)
MCV: 88.4 fL (ref 80.0–100.0)
Metamyelocytes Relative: 0 %
Monocytes Absolute: 0.3 10*3/uL (ref 0.1–1.0)
Monocytes Relative: 3 %
Myelocytes: 0 %
Neutro Abs: 6.9 10*3/uL (ref 1.7–7.7)
Neutrophils Relative %: 71 %
Other: 0 %
Platelet Count: 135 10*3/uL — ABNORMAL LOW (ref 150–400)
Promyelocytes Relative: 0 %
RBC: 4.84 MIL/uL (ref 3.87–5.11)
RDW: 15.6 % — ABNORMAL HIGH (ref 11.5–15.5)
Smear Review: NORMAL
WBC Count: 9.7 10*3/uL (ref 4.0–10.5)
nRBC: 0 % (ref 0.0–0.2)
nRBC: 0 /100 WBC

## 2021-11-19 LAB — SAVE SMEAR(SSMR), FOR PROVIDER SLIDE REVIEW

## 2021-11-19 LAB — LACTATE DEHYDROGENASE: LDH: 200 U/L — ABNORMAL HIGH (ref 98–192)

## 2021-11-19 MED ORDER — CEFDINIR 300 MG PO CAPS: 600.0000 mg | ORAL_CAPSULE | Freq: Every day | ORAL | 0 refills | Status: DC

## 2021-11-19 NOTE — Progress Notes (Signed)
Hematology and Oncology Follow Up Visit  Tabitha Estrada [id number] [birth year] 51 y.o. 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    Past Therapy: Gleevec 400 mg po q day -- D/C'd on 06/15/2018 for periorbital edema  Sprycel 100 mg PO every other day (changed 08/04/2018) - d/c on 05/01/2019 Bosulif 200 mg po q day -- start on 05/07/2019 - patient stopped 06/26/2019 due to diarrhea    Current Therapy:        Tasigna 50 mg po q day -- start in 11/2019   -- d/c on 04/09/2020 Scemblix  20 mg po day -- start on 02/19/2021 --DC due to intolerance Hydrea 500 mg po BID -- start on 04/02/2021 Ponatinib 15 mg po q day -- start on 05/08/2021 -- hold on 06/15/2021 - restart on 08/12/2021   Interim History:  Tabitha Estrada is here today for follow-up.  Tabitha Estrada, unfortunately, had COVID a few weeks ago.  One of Tabitha Estrada children brought at home with them.  Thankfully, Tabitha Estrada was not admitted.  Tabitha Estrada has some congestion for a couple days.  Tabitha Estrada still sounds like Tabitha Estrada has some type of respiratory issue.  Tabitha Estrada is wheezing.  He says Tabitha Estrada coughs up yellow-brown mucus.  I will plan to send in some antibiotic for Tabitha Estrada.  I will send in some Omnicef (600 mg p.o. daily x7 days).  Tabitha Estrada is still smoking.  Tabitha Estrada by smokes a pack and half a day now.  Hopefully, Tabitha Estrada will be able to cut back on this.  Tabitha Estrada is doing well on the ponatinib.  Tabitha Estrada is not having diarrhea.  Tabitha Estrada last BCR/ABL was down to 10%.  Tabitha Estrada has had no issues with Tabitha Estrada diabetes.  Tabitha Estrada is trying to monitor Tabitha Estrada diabetes closely.  Tabitha Estrada has had no bleeding.  There is been no leg swelling.  Tabitha Estrada has had no headache.  Overall, I would say that Tabitha Estrada performance status is probably ECOG 2.    Medications:  Allergies as of 11/19/2021       Reactions   Dulaglutide Nausea And Vomiting, Other (See Comments)   Elemental Sulfur Nausea And Vomiting   Liraglutide Nausea And Vomiting   Metformin Diarrhea   Sulfamethoxazole Nausea And Vomiting         Medication List        Accurate as of November 19, 2021 12:47 PM. If you have any questions, ask your nurse or doctor.          acetaminophen 500 MG tablet Commonly known as: TYLENOL Take 1,000 mg by mouth every 6 (six) hours as needed for moderate pain.   albuterol 108 (90 Base) MCG/ACT inhaler Commonly known as: VENTOLIN HFA Inhale 1-2 puffs into the lungs every 4 (four) hours as needed for wheezing or shortness of breath.   albuterol (2.5 MG/3ML) 0.083% nebulizer solution Commonly known as: PROVENTIL Inhale the contents of one vial in nebulizer every four to six hours as needed for cough or wheeze.   Aspirin Low Dose 81 MG tablet Generic drug: aspirin EC Take 81 mg by mouth daily.   atorvastatin 80 MG tablet Commonly known as: LIPITOR TAKE 1 TABLET (80 MG TOTAL) BY MOUTH DAILY. NEEDS LAB WORK   Baqsimi One Pack 3 MG/DOSE Powd Generic drug: Glucagon Place 1 spray into both nostrils as needed for low blood sugar.   budesonide-formoterol 160-4.5 MCG/ACT inhaler Commonly known as: SYMBICORT Inhale 2 puffs into the lungs 2 (two) times daily.   clindamycin  300 MG capsule Commonly known as: CLEOCIN Take 300 mg by mouth 3 (three) times daily.   colestipol 1 g tablet Commonly known as: COLESTID Take 1 tablet (1 g total) by mouth every morning.   cyanocobalamin 1000 MCG tablet Commonly known as: VITAMIN B12 Take 1,000 mcg by mouth daily.   cyclobenzaprine 10 MG tablet Commonly known as: FLEXERIL Take 10 mg by mouth daily as needed for muscle spasms.   Dexcom G6 Sensor Misc Apply topically.   diphenoxylate-atropine 2.5-0.025 MG tablet Commonly known as: LOMOTIL Take 1 tablet by mouth 4 (four) times daily as needed for diarrhea or loose stools (as needed after taking Sprycel for diarrhea).   fluticasone 50 MCG/ACT nasal spray Commonly known as: FLONASE Use two sprays in each nostril once daily as directed. What changed:  how much to take how to take  this when to take this reasons to take this additional instructions   folic acid 1 MG tablet Commonly known as: FOLVITE Take 1 mg by mouth daily.   ibuprofen 200 MG tablet Commonly known as: ADVIL Take 600 mg by mouth every 6 (six) hours as needed for moderate pain.   insulin aspart 100 UNIT/ML FlexPen Commonly known as: NovoLOG FlexPen Inject 40 Units into the skin daily with supper. What changed:  how much to take when to take this additional instructions   meclizine 25 MG tablet Commonly known as: ANTIVERT Take 1 tablet (25 mg total) by mouth 3 (three) times daily as needed for dizziness.   montelukast 10 MG tablet Commonly known as: SINGULAIR TAKE 1 TABLET (10 MG TOTAL) BY MOUTH DAILY   mupirocin ointment 2 % Commonly known as: BACTROBAN Apply 1 application. topically daily.   omeprazole 40 MG capsule Commonly known as: PRILOSEC Take 40 mg by mouth 2 (two) times daily.   ondansetron 4 MG disintegrating tablet Commonly known as: ZOFRAN-ODT Take 4 mg by mouth every 8 (eight) hours as needed for nausea or vomiting.   OXYGEN Inhale into the lungs. Pt reports 2-3 lpm continuous.   PONATinib HCl 15 MG tablet Commonly known as: ICLUSIG Take 1 tablet (15 mg total) by mouth daily.   potassium chloride 20 MEQ packet Commonly known as: KLOR-CON Take 20 mEq by mouth daily.   potassium chloride SA 20 MEQ tablet Commonly known as: KLOR-CON M Take 1 tablet (20 mEq total) by mouth daily.   pregabalin 200 MG capsule Commonly known as: LYRICA Take 200 mg by mouth 2 (two) times daily.   torsemide 20 MG tablet Commonly known as: DEMADEX Take 20 mg by mouth daily.   valACYclovir 500 MG tablet Commonly known as: VALTREX TAKE ONE TABLET BY MOUTH AT BEDTIME   valsartan 40 MG tablet Commonly known as: DIOVAN Take 40 mg by mouth daily.        Allergies:  Allergies  Allergen Reactions   Dulaglutide Nausea And Vomiting and Other (See Comments)   Elemental  Sulfur Nausea And Vomiting   Liraglutide Nausea And Vomiting   Metformin Diarrhea   Sulfamethoxazole Nausea And Vomiting    Past Medical History, Surgical history, Social history, and Family History were reviewed and updated.  Review of Systems: Review of Systems  Constitutional: Negative.   Eyes: Negative.   Respiratory:  Positive for cough.   Cardiovascular:  Positive for palpitations.  Gastrointestinal:  Positive for diarrhea.  Genitourinary: Negative.   Musculoskeletal:  Positive for back pain and neck pain.  Skin:  Positive for rash.  Neurological:  Positive for  headaches.  Endo/Heme/Allergies: Negative.   Psychiatric/Behavioral: Negative.       Physical Exam:  weight is 239 lb (108.4 kg). Tabitha Estrada oral temperature is 98.1 F (36.7 C). Tabitha Estrada blood pressure is 118/77 and Tabitha Estrada pulse is 72. Tabitha Estrada respiration is 18 and oxygen saturation is 100%.   Wt Readings from Last 3 Encounters:  11/19/21 239 lb (108.4 kg)  09/02/21 247 lb (112 kg)  08/04/21 246 lb (111.6 kg)  Tabitha Estrada vital signs are temperature of 97.9.  Pulse 81.  Blood pressure 118/58.  Weight is 269 pounds.  Physical Exam Vitals reviewed.  HENT:     Head: Normocephalic and atraumatic.  Eyes:     Pupils: Pupils are equal, round, and reactive to light.  Cardiovascular:     Rate and Rhythm: Normal rate and regular rhythm.     Heart sounds: Normal heart sounds.  Pulmonary:     Effort: Pulmonary effort is normal.     Breath sounds: Normal breath sounds.  Abdominal:     General: Bowel sounds are normal.     Palpations: Abdomen is soft.  Musculoskeletal:        General: No tenderness or deformity. Normal range of motion.     Cervical back: Normal range of motion.  Lymphadenopathy:     Cervical: No cervical adenopathy.  Skin:    General: Skin is warm and dry.     Findings: No erythema or rash.  Neurological:     Mental Status: Tabitha Estrada is alert and oriented to person, place, and time.  Psychiatric:        Behavior: Behavior  normal.        Thought Content: Thought content normal.        Judgment: Judgment normal.      Lab Results  Component Value Date   WBC 9.7 11/19/2021   HGB 13.8 11/19/2021   HCT 42.8 11/19/2021   MCV 88.4 11/19/2021   PLT 135 (L) 11/19/2021   Lab Results  Component Value Date   FERRITIN 352 (H) 11/20/2020   IRON 46 11/20/2020   TIBC 223 (L) 11/20/2020   UIBC 177 11/20/2020   IRONPCTSAT 21 11/20/2020   Lab Results  Component Value Date   RETICCTPCT 1.5 08/29/2020   RBC 4.84 11/19/2021   No results found for: "KPAFRELGTCHN", "LAMBDASER", "KAPLAMBRATIO" Lab Results  Component Value Date   IGGSERUM 675 (L) 11/10/2016   IGMSERUM 86 11/10/2016   No results found for: "TOTALPROTELP", "ALBUMINELP", "A1GS", "A2GS", "BETS", "BETA2SER", "GAMS", "MSPIKE", "SPEI"   Chemistry      Component Value Date/Time   NA 143 11/19/2021 1137   NA 144 01/27/2017 1302   NA 138 09/16/2016 1105   K 4.2 11/19/2021 1137   K 4.3 01/27/2017 1302   K 4.2 09/16/2016 1105   CL 102 11/19/2021 1137   CL 102 01/27/2017 1302   CO2 33 (H) 11/19/2021 1137   CO2 27 01/27/2017 1302   CO2 26 09/16/2016 1105   BUN 20 11/19/2021 1137   BUN 14 01/27/2017 1302   BUN 11.9 09/16/2016 1105   CREATININE 1.20 (H) 11/19/2021 1137   CREATININE 1.1 01/27/2017 1302   CREATININE 0.8 09/16/2016 1105      Component Value Date/Time   CALCIUM 9.7 11/19/2021 1137   CALCIUM 9.5 01/27/2017 1302   CALCIUM 9.0 09/16/2016 1105   ALKPHOS 143 (H) 11/19/2021 1137   ALKPHOS 116 (H) 01/27/2017 1302   ALKPHOS 136 09/16/2016 1105   AST 8 (L) 11/19/2021 1137  AST 8 09/16/2016 1105   ALT 12 11/19/2021 1137   ALT 25 01/27/2017 1302   ALT 12 09/16/2016 1105   BILITOT 0.6 11/19/2021 1137   BILITOT 0.32 09/16/2016 1105       Impression and Plan: Tabitha Estrada is a very pleasant 51 yo with chronic phase CML.  Tabitha Estrada really has had a hard time with all of the TKI medications.  The ponatinib is helping.  Tabitha Estrada BCR/ABL is coming down.   We will have to see what the latest level is.  Again, smoking is can be a real problem.  Hopefully, Tabitha Estrada will be able to cut back a little bit.  I know this is very difficult for Tabitha Estrada.  I would like to see Tabitha Estrada back right before the [place] season.  I will see Tabitha Estrada back in November.  If everything looks good at that point, then we will get Tabitha Estrada through the holidays.     [name], MD 9/21/202312:47 PM

## 2021-11-19 NOTE — Telephone Encounter (Signed)
Per 11/19/21 los - called and lvm of upcoming appointments - requested callback to confirm

## 2021-11-26 ENCOUNTER — Encounter: Payer: Self-pay | Admitting: *Deleted

## 2021-11-26 LAB — BCR/ABL

## 2021-12-17 ENCOUNTER — Encounter: Payer: Self-pay | Admitting: Gastroenterology

## 2022-01-05 ENCOUNTER — Inpatient Hospital Stay: Payer: Medicaid Other

## 2022-01-05 ENCOUNTER — Encounter: Payer: Self-pay | Admitting: Hematology & Oncology

## 2022-01-05 ENCOUNTER — Inpatient Hospital Stay: Payer: Medicaid Other | Admitting: Hematology & Oncology

## 2022-01-08 ENCOUNTER — Encounter: Payer: Self-pay | Admitting: *Deleted

## 2022-01-12 ENCOUNTER — Ambulatory Visit: Payer: Medicaid Other | Admitting: Hematology & Oncology

## 2022-01-12 ENCOUNTER — Other Ambulatory Visit: Payer: Medicaid Other

## 2022-02-03 ENCOUNTER — Encounter: Payer: Self-pay | Admitting: Gastroenterology

## 2022-03-10 ENCOUNTER — Other Ambulatory Visit: Payer: Medicaid Other

## 2022-03-10 ENCOUNTER — Ambulatory Visit: Payer: Medicaid Other | Admitting: Hematology & Oncology

## 2022-03-17 ENCOUNTER — Telehealth: Payer: Self-pay

## 2022-03-17 NOTE — Telephone Encounter (Signed)
During PV chart prep, it was found that patient is not due for recall colon at this time--clarification was also received from provider that when patient is recalled for her procedures they will need to be performed at [facility];  Patient to be contacted, PV and colon at [facility] to be cancelled, recall for EGD to be placed for 07/2026 and colon recall to be placed for 11/2023;

## 2022-03-17 NOTE — Telephone Encounter (Signed)
Contacted pt. Let her know about recall dates and that procedure and previsit have been canceled.  Pt verbalized understanding and had no other concerns at end of call. EGD and colon recalls placed.

## 2022-03-19 ENCOUNTER — Other Ambulatory Visit: Payer: Self-pay | Admitting: *Deleted

## 2022-03-19 ENCOUNTER — Other Ambulatory Visit: Payer: Self-pay | Admitting: Hematology & Oncology

## 2022-03-19 DIAGNOSIS — C9210 Chronic myeloid leukemia, BCR/ABL-positive, not having achieved remission: Secondary | ICD-10-CM

## 2022-03-19 MED ORDER — PONATINIB HCL 15 MG PO TABS: 15.0000 mg | ORAL_TABLET | Freq: Every day | ORAL | 4 refills | Status: DC

## 2022-04-07 ENCOUNTER — Encounter: Payer: Self-pay | Admitting: Hematology & Oncology

## 2022-04-07 ENCOUNTER — Inpatient Hospital Stay (HOSPITAL_BASED_OUTPATIENT_CLINIC_OR_DEPARTMENT_OTHER): Payer: Medicaid Other | Admitting: Family

## 2022-04-07 ENCOUNTER — Encounter: Payer: Self-pay | Admitting: Family

## 2022-04-07 ENCOUNTER — Inpatient Hospital Stay: Payer: Medicaid Other | Attending: Hematology & Oncology

## 2022-04-07 VITALS — BP 114/64 | HR 73 | Temp 97.5°F | Resp 20 | Ht 65.0 in | Wt 245.1 lb

## 2022-04-07 DIAGNOSIS — C9210 Chronic myeloid leukemia, BCR/ABL-positive, not having achieved remission: Secondary | ICD-10-CM | POA: Insufficient documentation

## 2022-04-07 DIAGNOSIS — D509 Iron deficiency anemia, unspecified: Secondary | ICD-10-CM | POA: Diagnosis not present

## 2022-04-07 DIAGNOSIS — G629 Polyneuropathy, unspecified: Secondary | ICD-10-CM | POA: Insufficient documentation

## 2022-04-07 DIAGNOSIS — K5900 Constipation, unspecified: Secondary | ICD-10-CM | POA: Diagnosis not present

## 2022-04-07 DIAGNOSIS — D508 Other iron deficiency anemias: Secondary | ICD-10-CM

## 2022-04-07 LAB — CBC WITH DIFFERENTIAL (CANCER CENTER ONLY)
Abs Immature Granulocytes: 0.04 10*3/uL (ref 0.00–0.07)
Basophils Absolute: 0 10*3/uL (ref 0.0–0.1)
Basophils Relative: 0 %
Eosinophils Absolute: 0.1 10*3/uL (ref 0.0–0.5)
Eosinophils Relative: 1 %
HCT: 40.9 % (ref 36.0–46.0)
Hemoglobin: 13 g/dL (ref 12.0–15.0)
Immature Granulocytes: 0 %
Lymphocytes Relative: 14 %
Lymphs Abs: 1.4 10*3/uL (ref 0.7–4.0)
MCH: 29 pg (ref 26.0–34.0)
MCHC: 31.8 g/dL (ref 30.0–36.0)
MCV: 91.3 fL (ref 80.0–100.0)
Monocytes Absolute: 0.4 10*3/uL (ref 0.1–1.0)
Monocytes Relative: 5 %
Neutro Abs: 7.7 10*3/uL (ref 1.7–7.7)
Neutrophils Relative %: 80 %
Platelet Count: 161 10*3/uL (ref 150–400)
RBC: 4.48 MIL/uL (ref 3.87–5.11)
RDW: 14 % (ref 11.5–15.5)
WBC Count: 9.6 10*3/uL (ref 4.0–10.5)
nRBC: 0 % (ref 0.0–0.2)

## 2022-04-07 LAB — CMP (CANCER CENTER ONLY)
ALT: 96 U/L — ABNORMAL HIGH (ref 0–44)
AST: 34 U/L (ref 15–41)
Albumin: 4 g/dL (ref 3.5–5.0)
Alkaline Phosphatase: 198 U/L — ABNORMAL HIGH (ref 38–126)
Anion gap: 8 (ref 5–15)
BUN: 15 mg/dL (ref 6–20)
CO2: 34 mmol/L — ABNORMAL HIGH (ref 22–32)
Calcium: 9.8 mg/dL (ref 8.9–10.3)
Chloride: 100 mmol/L (ref 98–111)
Creatinine: 0.99 mg/dL (ref 0.44–1.00)
GFR, Estimated: >60 mL/min (ref >60)
Glucose, Bld: 171 mg/dL — ABNORMAL HIGH (ref 70–99)
Potassium: 3.7 mmol/L (ref 3.5–5.1)
Sodium: 142 mmol/L (ref 135–145)
Total Bilirubin: 0.5 mg/dL (ref 0.3–1.2)
Total Protein: 7.3 g/dL (ref 6.5–8.1)

## 2022-04-07 LAB — IRON AND IRON BINDING CAPACITY (CC-WL,HP ONLY)
Iron: 46 ug/dL (ref 28–170)
Saturation Ratios: 17 % (ref 10.4–31.8)
TIBC: 274 ug/dL (ref 250–450)
UIBC: 228 ug/dL (ref 148–442)

## 2022-04-07 LAB — FERRITIN: Ferritin: 195 ng/mL (ref 11–307)

## 2022-04-07 LAB — LACTATE DEHYDROGENASE: LDH: 225 U/L — ABNORMAL HIGH (ref 98–192)

## 2022-04-07 LAB — SAVE SMEAR(SSMR), FOR PROVIDER SLIDE REVIEW
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
## 2022-04-08 ENCOUNTER — Telehealth: Payer: Self-pay | Admitting: *Deleted

## 2022-04-08 NOTE — Telephone Encounter (Signed)
Per scheduling message [place] per Dr. Marin Olp, called patient and lvm for a call back to schedule a lab only on 05/07/22

## 2022-04-14 ENCOUNTER — Telehealth: Payer: Self-pay | Admitting: *Deleted

## 2022-04-14 LAB — BCR/ABL

## 2022-04-14 NOTE — Telephone Encounter (Signed)
Per scheduling message Antonietta Barcelona - called and lvm of upcoming lab only appointment - requested call back to confirm

## 2022-04-20 ENCOUNTER — Encounter: Payer: Medicaid Other | Admitting: Gastroenterology

## 2022-05-07 ENCOUNTER — Other Ambulatory Visit: Payer: Medicaid Other

## 2022-05-17 ENCOUNTER — Other Ambulatory Visit (INDEPENDENT_AMBULATORY_CARE_PROVIDER_SITE_OTHER): Payer: Medicaid Other

## 2022-05-17 ENCOUNTER — Ambulatory Visit (INDEPENDENT_AMBULATORY_CARE_PROVIDER_SITE_OTHER): Payer: Medicaid Other | Admitting: Nurse Practitioner

## 2022-05-17 ENCOUNTER — Encounter: Payer: Self-pay | Admitting: Nurse Practitioner

## 2022-05-17 VITALS — BP 126/86 | HR 65 | Ht 64.0 in | Wt 236.0 lb

## 2022-05-17 DIAGNOSIS — R1012 Left upper quadrant pain: Secondary | ICD-10-CM

## 2022-05-17 DIAGNOSIS — R7989 Other specified abnormal findings of blood chemistry: Secondary | ICD-10-CM

## 2022-05-17 DIAGNOSIS — K5900 Constipation, unspecified: Secondary | ICD-10-CM

## 2022-05-17 LAB — BASIC METABOLIC PANEL
BUN: 10 mg/dL (ref 6–23)
CO2: 35 mEq/L — ABNORMAL HIGH (ref 19–32)
Calcium: 9.1 mg/dL (ref 8.4–10.5)
Chloride: 97 mEq/L (ref 96–112)
Creatinine, Ser: 1.12 mg/dL (ref 0.40–1.20)
GFR: 57.37 mL/min — ABNORMAL LOW (ref >60.00)
Glucose, Bld: 180 mg/dL — ABNORMAL HIGH (ref 70–99)
Potassium: 2.9 mEq/L — ABNORMAL LOW (ref 3.5–5.1)
Sodium: 141 mEq/L (ref 135–145)

## 2022-05-17 LAB — HEPATIC FUNCTION PANEL
ALT: 10 U/L (ref 0–35)
AST: 7 U/L (ref 0–37)
Albumin: 3.8 g/dL (ref 3.5–5.2)
Alkaline Phosphatase: 142 U/L — ABNORMAL HIGH (ref 39–117)
Bilirubin, Direct: 0.1 mg/dL (ref 0.0–0.3)
Total Bilirubin: 0.6 mg/dL (ref 0.2–1.2)
Total Protein: 7.2 g/dL (ref 6.0–8.3)

## 2022-05-17 LAB — CBC
HCT: 42 % (ref 36.0–46.0)
Hemoglobin: 14.1 g/dL (ref 12.0–15.0)
MCHC: 33.6 g/dL (ref 30.0–36.0)
MCV: 86.8 fl (ref 78.0–100.0)
Platelets: 188 10*3/uL (ref 150.0–400.0)
RBC: 4.84 Mil/uL (ref 3.87–5.11)
RDW: 15.1 % (ref 11.5–15.5)
WBC: 10.6 10*3/uL — ABNORMAL HIGH (ref 4.0–10.5)

## 2022-05-17 LAB — LIPASE: Lipase: 9 U/L — ABNORMAL LOW (ref 11.0–59.0)

## 2022-05-17 MED ORDER — ONDANSETRON 4 MG PO TBDP: 4.0000 mg | ORAL_TABLET | Freq: Three times a day (TID) | ORAL | 1 refills | Status: DC | PRN

## 2022-05-17 NOTE — Patient Instructions (Addendum)
Your provider has requested that you go to the basement level for lab work before leaving today. Press "B" on the elevator. The lab is located at the first door on the left as you exit the elevator.  Miralax- every night as needed  Dulcolax- 1-2 tablets b y mouth every 3rd night  May use a Dulcolax suppository 1 per rectum every night as needed  Due to recent changes in healthcare laws, you may see the results of your imaging and laboratory studies on MyChart before your provider has had a chance to review them.  We understand that in some cases there may be results that are confusing or concerning to you. Not all laboratory results come back in the same time frame and the provider may be waiting for multiple results in order to interpret others.  Please give [place] 48 hours in order for your provider to thoroughly review all the results before contacting the office for clarification of your results.    Thank you for trusting me with your gastrointestinal care!   Carl Best, CRNP

## 2022-05-17 NOTE — Progress Notes (Signed)
05/17/2022 AYE WILKER [id number] September 26, 1970   Chief Complaint: Left upper abdominal pain, constipation and diarrhea   History of Present Illness: Tabitha Estrada is a 52 year old female with a past medical history of anxiety, depression, arthritis, fibromyalgia, diabetes mellitus type 2, asthma, COPD, CML, splenomegaly, kidney stones and GERD. S/P cholecystectomy 1992. She is known by Dr. Bryan Lemma.  She presents today with complaints of sharp and dull epigastric pain which wraps around to the left upper abdomen which started a few months ago.  Eating sometimes exacerbates this pain.  No specific food triggers.  He gets full more easily.  He has intermittent upper abdominal bloat.  She has nausea on and off for the past few months without vomiting.  She was taking Zofran as needed which controlled her nausea but she ran out of her supply 1 month ago.  No heartburn. She underwent an EGD having dysphagia 08/04/2021 which showed hospital Barrett's esophagus but biopsies were negative for intestinal metaplasia, a 1 cm hiatal hernia and gastritis.  The esophagus was dilated.  She stated as long as she takes time to chew her food and eats slowly she has no further dysphagia.  She has chronic constipation followed by diarrhea.  He sometimes has fecal leakage.  She goes 5 to 7 days without passing a bowel movement then passes multiple nonbloody solid then loose stools for 1 or 2 days and the cycle repeats.  She infrequently takes Lomotil.  She denies taking any fiber supplement or laxative.  She underwent a colonoscopy 12/07/2018 which was normal.  Laboratory studies 04/07/2022 showed elevated alk phos level of 198.  AST 34.  ALT 96.  Total bili 0.5.  She underwent an abdominal sonogram as ordered by Dr. Marin Olp 05/15/2021 which showed a normal spleen with evidence of hepatic steatosis and prior cholecystectomy.  She takes ASA 81mg  daily. She occasionally takes Advil 200mg  3 tabs as needed,  infrequently. Once monthly.     Latest Ref Rng & Units 04/07/2022   10:11 AM 11/19/2021   11:37 AM 09/21/2021   10:40 AM  CBC  WBC 4.0 - 10.5 K/uL 9.6  9.7  10.0   Hemoglobin 12.0 - 15.0 g/dL 13.0  13.8  13.2   Hematocrit 36.0 - 46.0 % 40.9  42.8  40.9   Platelets 150 - 400 K/uL 161  135  163        Latest Ref Rng & Units 04/07/2022   10:11 AM 11/19/2021   11:37 AM 09/02/2021    2:44 PM  CMP  Glucose 70 - 99 mg/dL 171  213  198   BUN 6 - 20 mg/dL 15  20  18    Creatinine 0.44 - 1.00 mg/dL 0.99  1.20  1.10   Sodium 135 - 145 mmol/L 142  143  140   Potassium 3.5 - 5.1 mmol/L 3.7  4.2  3.6   Chloride 98 - 111 mmol/L 100  102  101   CO2 22 - 32 mmol/L 34  33  30   Calcium 8.9 - 10.3 mg/dL 9.8  9.7  9.4   Total Protein 6.5 - 8.1 g/dL 7.3  7.2  7.2   Total Bilirubin 0.3 - 1.2 mg/dL 0.5  0.6  0.5   Alkaline Phos 38 - 126 U/L 198  143  122   AST 15 - 41 U/L 34  8  14   ALT 0 - 44 U/L 96  12  15  PAST GI PROCEDURES:  - Colonoscopy (04/2016): Diverticulosis, internal hemorrhoids - EGD (04/2018) 3 cm segment of nondysplastic Barrett's Esophagus.  Empiric dilation with 56 French Maloney dilator - EGD (11/2018): Nondysplastic Barrett's Esophagus.  Empirically dilated with 56 French Maloney dilator - Colonoscopy (11/2018): Normal.  Biopsies negative for MC   EGD 08/04/2021: - Esophageal mucosal changes classified as Barrett's stage C3-M3 per [place] criteria. Biopsied. - 1 cm hiatal hernia. - Normal upper third of esophagus and middle third of esophagus. Dilated with 17 mm Savary dilator. - Gastritis. Biopsied. - Erythematous duodenopathy. Biopsied. - Normal first portion of the duodenum and second portion of the duodenum. Biopsied. Impression:  A. DUODENUM, BIOPSY:  - Duodenal mucosa with no specific histopathologic changes  - Negative for increased intraepithelial lymphocytes or villous  architectural changes   B. DUODENUM, BULB, BIOPSY:  - Duodenal mucosa with focal foveolar metaplasia,  suggestive of mild  peptic injury  - Negative for increased intraepithelial lymphocytes or villous  architectural changes   C. STOMACH, BIOPSY:  - Gastric antral and oxyntic mucosa with no specific histopathologic  changes  - Helicobacter pylori-like organisms are not identified on routine HE  stain   D. ESOPHAGUS, BIOPSY:  - Esophageal squamous and cardiac mucosa with no specific  histopathologic changes  - Negative for intestinal metaplasia or dysplasia      Latest Ref Rng & Units 04/07/2022   10:11 AM 11/19/2021   11:37 AM 09/21/2021   10:40 AM  CBC  WBC 4.0 - 10.5 K/uL 9.6  9.7  10.0   Hemoglobin 12.0 - 15.0 g/dL 13.0  13.8  13.2   Hematocrit 36.0 - 46.0 % 40.9  42.8  40.9   Platelets 150 - 400 K/uL 161  135  163         Latest Ref Rng & Units 04/07/2022   10:11 AM 11/19/2021   11:37 AM 09/02/2021    2:44 PM  CMP  Glucose 70 - 99 mg/dL 171  213  198   BUN 6 - 20 mg/dL 15  20  18    Creatinine 0.44 - 1.00 mg/dL 0.99  1.20  1.10   Sodium 135 - 145 mmol/L 142  143  140   Potassium 3.5 - 5.1 mmol/L 3.7  4.2  3.6   Chloride 98 - 111 mmol/L 100  102  101   CO2 22 - 32 mmol/L 34  33  30   Calcium 8.9 - 10.3 mg/dL 9.8  9.7  9.4   Total Protein 6.5 - 8.1 g/dL 7.3  7.2  7.2   Total Bilirubin 0.3 - 1.2 mg/dL 0.5  0.6  0.5   Alkaline Phos 38 - 126 U/L 198  143  122   AST 15 - 41 U/L 34  8  14   ALT 0 - 44 U/L 96  12  15     Abdominal sonogram 05/18/2021:  FINDINGS: Gallbladder: Surgically absent.   Common bile duct: Diameter: 6 mm   Liver: No focal lesion identified. There is diffuse increased echotexture of liver. Portal vein is patent on color Doppler imaging with normal direction of blood flow towards the liver.   IVC: No abnormality visualized.   Pancreas: Visualized portion unremarkable.   Spleen: Size and appearance within normal limits. Spleen measures 11.9 cm.   Right Kidney: Length: 12.6 cm. Echogenicity within normal limits. No hydronephrosis visualized.  There is a 1.4 x 1.4 x 1.7 cm simple cyst in the right kidney. No follow-up is necessary.  Left Kidney: Length: 12.5 cm. Echogenicity within normal limits. No mass or hydronephrosis visualized.   Abdominal aorta: No aneurysm visualized.   Other findings: None.   IMPRESSION: The spleen is normal in size measuring 11.9 cm.   Prior cholecystectomy.   Diffuse increased echotexture of the liver can be seen in fatty infiltration of liver.  Current Outpatient Medications on File Prior to Visit  Medication Sig Dispense Refill   albuterol (PROVENTIL HFA;VENTOLIN HFA) 108 (90 Base) MCG/ACT inhaler Inhale 1-2 puffs into the lungs every 4 (four) hours as needed for wheezing or shortness of breath. 1 Inhaler 12   albuterol (PROVENTIL) (2.5 MG/3ML) 0.083% nebulizer solution Inhale the contents of one vial in nebulizer every four to six hours as needed for cough or wheeze. 180 mL 1   ASPIRIN LOW DOSE 81 MG EC tablet Take 81 mg by mouth daily.     atorvastatin (LIPITOR) 80 MG tablet TAKE 1 TABLET (80 MG TOTAL) BY MOUTH DAILY. NEEDS LAB WORK 15 tablet 0   budesonide-formoterol (SYMBICORT) 160-4.5 MCG/ACT inhaler Inhale 2 puffs into the lungs 2 (two) times daily. 1 Inhaler 11   Continuous Blood Gluc Sensor (DEXCOM G6 SENSOR) MISC Apply topically.     diphenoxylate-atropine (LOMOTIL) 2.5-0.025 MG tablet Take 1 tablet by mouth 4 (four) times daily as needed for diarrhea or loose stools (as needed after taking Sprycel for diarrhea). 60 tablet 1   ezetimibe (ZETIA) 10 MG tablet Take by mouth.     folic acid (FOLVITE) 1 MG tablet Take 1 mg by mouth daily.     insulin aspart (NOVOLOG FLEXPEN) 100 UNIT/ML FlexPen Inject 40 Units into the skin daily with supper. (Patient taking differently: Inject 15-40 Units into the skin See admin instructions. Use in omnipod insulin pump, administers 3 doses per day) 15 mL 6   montelukast (SINGULAIR) 10 MG tablet TAKE 1 TABLET (10 MG TOTAL) BY MOUTH DAILY 90 tablet 0    omeprazole (PRILOSEC) 40 MG capsule Take 40 mg by mouth 2 (two) times daily.     pregabalin (LYRICA) 200 MG capsule Take 200 mg by mouth 2 (two) times daily.     torsemide (DEMADEX) 20 MG tablet Take 20 mg by mouth daily.     valACYclovir (VALTREX) 500 MG tablet TAKE ONE TABLET BY MOUTH AT BEDTIME 90 tablet 1   vitamin B-12 (CYANOCOBALAMIN) 1000 MCG tablet Take 1,000 mcg by mouth daily.     acetaminophen (TYLENOL) 500 MG tablet Take 1,000 mg by mouth every 6 (six) hours as needed for moderate pain. (Patient not taking: Reported on 05/17/2022)     BAQSIMI ONE PACK 3 MG/DOSE POWD Place 1 spray into both nostrils as needed for low blood sugar. (Patient not taking: Reported on 05/17/2022)     cefdinir (OMNICEF) 300 MG capsule Take 2 capsules (600 mg total) by mouth daily. (Patient not taking: Reported on 05/17/2022) 14 capsule 0   clindamycin (CLEOCIN) 300 MG capsule Take 300 mg by mouth 3 (three) times daily. (Patient not taking: Reported on 05/17/2022)     colestipol (COLESTID) 1 g tablet Take 1 tablet (1 g total) by mouth every morning. (Patient not taking: Reported on 05/17/2022) 30 tablet 5   cyclobenzaprine (FLEXERIL) 10 MG tablet Take 10 mg by mouth daily as needed for muscle spasms. (Patient not taking: Reported on 05/17/2022)     fluticasone (FLONASE) 50 MCG/ACT nasal spray Use two sprays in each nostril once daily as directed. (Patient not taking: Reported on 05/17/2022) 16 g  5   ibuprofen (ADVIL) 200 MG tablet Take 600 mg by mouth every 6 (six) hours as needed for moderate pain. (Patient not taking: Reported on 05/17/2022)     meclizine (ANTIVERT) 25 MG tablet Take 1 tablet (25 mg total) by mouth 3 (three) times daily as needed for dizziness. (Patient not taking: Reported on 05/17/2022) 30 tablet 0   mupirocin ointment (BACTROBAN) 2 % Apply 1 application. topically daily. (Patient not taking: Reported on 05/17/2022)     ondansetron (ZOFRAN-ODT) 4 MG disintegrating tablet Take 4 mg by mouth every 8  (eight) hours as needed for nausea or vomiting. (Patient not taking: Reported on 05/17/2022)     OXYGEN Inhale into the lungs. Pt reports 2-3 lpm continuous. (Patient not taking: Reported on 05/17/2022)     PONATinib HCl (ICLUSIG) 15 MG tablet Take 1 tablet (15 mg total) by mouth daily. (Patient not taking: Reported on 05/17/2022) 30 tablet 4   potassium chloride (KLOR-CON) 20 MEQ packet Take 20 mEq by mouth daily. (Patient not taking: Reported on 05/17/2022)     potassium chloride SA (KLOR-CON) 20 MEQ tablet Take 1 tablet (20 mEq total) by mouth daily. (Patient not taking: Reported on 05/17/2022) 30 tablet 4   valsartan (DIOVAN) 40 MG tablet Take 40 mg by mouth daily. (Patient not taking: Reported on 05/17/2022)     Current Facility-Administered Medications on File Prior to Visit  Medication Dose Route Frequency Provider Last Rate Last Admin   ipratropium-albuterol (DUONEB) 0.5-2.5 (3) MG/3ML nebulizer solution 3 mL  3 mL Nebulization Q6H Emeterio Reeve, DO       Allergies  Allergen Reactions   Dulaglutide Nausea And Vomiting and Other (See Comments)   Elemental Sulfur Nausea And Vomiting   Liraglutide Nausea And Vomiting   Metformin Diarrhea   Sulfamethoxazole Nausea And Vomiting    Current Medications, Allergies, Past Medical History, Past Surgical History, Family History and Social History were reviewed in [employer] record.   Review of Systems:   Constitutional: Negative for fever, sweats, chills or weight loss.  Respiratory: Negative for shortness of breath.   Cardiovascular: Negative for chest pain, palpitations and leg swelling.  Gastrointestinal: See HPI.  Musculoskeletal: Negative for back pain or muscle aches.  Neurological: Negative for dizziness, headaches or paresthesias.    Physical Exam: BP 126/86   Pulse 65   Ht 5\' 4"  (1.626 m)   Wt 236 lb (107 kg)   LMP 11/17/2016 (Approximate)   BMI 40.51 kg/m  General: 52 year old female in no acute  distress. Head: Normocephalic and atraumatic. Eyes: No scleral icterus. Conjunctiva pink . Ears: Normal auditory acuity. Mouth: No ulcers or lesions.  Lungs: Clear throughout to auscultation. Heart: Regular rate and rhythm, no murmur. Abdomen: Soft, nondistended.  Mild LUQ tenderness without rebound or guarding.  No masses or hepatomegaly. Normal bowel sounds x 4 quadrants.  Rectal: Patient declined rectal exam. Musculoskeletal: Symmetrical with no gross deformities. Extremities: No edema. Neurological: Alert oriented x 4. No focal deficits.  Psychological: Alert and cooperative. Normal mood and affect  Assessment and Recommendations:  52 year old female with epigastric and LUQ pain with nausea without vomiting for the past few months.  Remote cholecystectomy.  Elevated alk phos and ALT level per labs 04/07/2022. -Hepatic panel, BMP, lipase and CBC -Consider abdominal MRI/MRCP versus CTAP, await the above lab results prior to ordering image study -Ondansetron ODT 4 mg dissolve 1 tab on tongue every 8 hours as needed  History of GERD, Barrett's esophagus. EGD 08/04/2021  showed possible Barrett's esophagus, a 1 cm hiatal hernia and gastritis.  Esophageal biopsies were negative for Barrett's esophagus.  The esophagus was dilated.  No further dysphagia. -Continue Omeprazole 40 mg twice daily -Continue GERD diet  Altered bowel pattern, alternating constipation followed by diarrhea intermittent fecal leakage.  Normal colonoscopy 11/2018. -MiraLAX nightly -Dulcolax 1-2 tabs every third night -Dulcolax suppository nightly as needed  Hepatic steatosis per abdominal sonogram -Repeat hepatic panel -Abdominal MRI/MRCP versus CTAP as noted above -If LFTs remain elevated, additional laboratory studies to rule out viral hepatitis and autoimmune liver disease will be ordered

## 2022-05-18 ENCOUNTER — Other Ambulatory Visit: Payer: Self-pay

## 2022-05-18 DIAGNOSIS — E876 Hypokalemia: Secondary | ICD-10-CM

## 2022-05-18 MED ORDER — POTASSIUM CHLORIDE CRYS ER 20 MEQ PO TBCR: 20.0000 meq | EXTENDED_RELEASE_TABLET | Freq: Two times a day (BID) | ORAL | 0 refills | Status: DC

## 2022-05-19 ENCOUNTER — Telehealth: Payer: Self-pay | Admitting: Nurse Practitioner

## 2022-05-19 NOTE — Telephone Encounter (Signed)
Spoke with pt: Documented under result notes ?

## 2022-05-19 NOTE — Telephone Encounter (Signed)
Inbound call from patient returning Tabitha Estrada's call in regards to lab results.Please advise.

## 2022-05-24 NOTE — Progress Notes (Signed)
Agree with the assessment and plan as outlined by Colleen Kennedy-Smith, NP.   Randa Riss, DO, FACG [facility]   

## 2022-05-25 ENCOUNTER — Encounter: Payer: Self-pay | Admitting: Hematology & Oncology

## 2022-05-26 ENCOUNTER — Encounter: Payer: Self-pay | Admitting: Hematology & Oncology

## 2022-05-26 ENCOUNTER — Other Ambulatory Visit: Payer: Self-pay

## 2022-05-26 DIAGNOSIS — R194 Change in bowel habit: Secondary | ICD-10-CM

## 2022-05-26 DIAGNOSIS — R748 Abnormal levels of other serum enzymes: Secondary | ICD-10-CM

## 2022-05-26 DIAGNOSIS — R7989 Other specified abnormal findings of blood chemistry: Secondary | ICD-10-CM

## 2022-05-26 DIAGNOSIS — R1012 Left upper quadrant pain: Secondary | ICD-10-CM

## 2022-05-27 ENCOUNTER — Telehealth: Payer: Self-pay

## 2022-05-27 ENCOUNTER — Telehealth: Payer: Self-pay | Admitting: Nurse Practitioner

## 2022-05-27 NOTE — Telephone Encounter (Signed)
PT is calling to discuss the CT scheduled for 4/7.

## 2022-05-27 NOTE — Telephone Encounter (Signed)
Oral Oncology Patient Advocate Encounter  Reached out and spoke with patient regarding PAP paperwork, explained that I would send it to their preferred email via DocuSign.   Confirmed email address: [email or web address].    Patient expressed understanding and consent.  Will follow up once paperwork has been signed and returned.   Berdine Addison, [facility] Patient [place]  [phone] (phone) [phone] (fax) 05/27/2022 11:28 AM

## 2022-05-27 NOTE — Telephone Encounter (Signed)
Spoke with pt.  Documented in result notes. Pt verbalized understanding with all questions answered.   

## 2022-05-27 NOTE — Telephone Encounter (Signed)
My Chart message was sent to pt

## 2022-05-27 NOTE — Telephone Encounter (Signed)
Patient is returning your call in regards Lab results

## 2022-05-27 NOTE — Telephone Encounter (Signed)
Received back patient signature. I will submit once I have MD signed portion of application in hand.    Berdine Addison, [facility] Patient [place]  [phone] (phone) [phone] (fax) 05/27/2022 11:47 AM

## 2022-05-27 NOTE — Telephone Encounter (Signed)
Oral Oncology Patient Advocate Encounter   Received a call from patient informing me that they would be losing their insurance on 06/29/22 and would not have any coverage starting 06/30/22. Therefore, we are starting PAP since they will be uninsured starting in May.   Began application for assistance for Iclusig through [employer] [facility].   Application will be submitted upon completion of necessary supporting documentation.   [employer]'s phone number [phone].   I will continue to check the status until final determination.    Berdine Addison, [facility] Patient [place]  [phone] (phone) [phone] (fax) 05/27/2022 11:28 AM

## 2022-05-27 NOTE — Telephone Encounter (Signed)
Inbound call from patient regarding CT, She said she call the number you provide to her and they told her that she need to get in touch with her pcp in order to reschedule .PLease advise

## 2022-05-27 NOTE — Telephone Encounter (Signed)
Pt stated that she request to be scheduled at [facility]. Spoke to Ray who stated from there facility that the CT has to be authorized prior to scheduling. Secure chat sent to our pre cert team to assist. Pt original CT scan was canceled.

## 2022-05-28 ENCOUNTER — Other Ambulatory Visit: Payer: Self-pay | Admitting: *Deleted

## 2022-05-28 DIAGNOSIS — C9210 Chronic myeloid leukemia, BCR/ABL-positive, not having achieved remission: Secondary | ICD-10-CM

## 2022-05-28 MED ORDER — PONATINIB HCL 15 MG PO TABS: 15.0000 mg | ORAL_TABLET | Freq: Every day | ORAL | 4 refills | Status: DC

## 2022-05-28 NOTE — Telephone Encounter (Signed)
Received notification from [place] that rx was received and application has resumed processing. I will continue to follow and update until final determination.    Berdine Addison, [place] [facility] Patient [place]  [phone] (phone) [phone] (fax) 05/28/2022 1:29 PM

## 2022-05-28 NOTE — Telephone Encounter (Signed)
Received notification from Bernita Buffy that a script was needed to continue processing application. MD office notified and requested script be faxed to [phone].   Berdine Addison, [place] [facility] Patient [place]  [phone] (phone) [phone] (fax) 05/28/2022 10:32 AM

## 2022-05-28 NOTE — Telephone Encounter (Signed)
Received notification from [place] that application was received and is now in processing.    [name], [facility] Patient [place]  [phone] (phone) [phone] (fax) 05/28/2022 10:30 AM

## 2022-05-28 NOTE — Telephone Encounter (Signed)
Oral Oncology Patient Advocate Encounter   Submitted application for assistance for Iclusig to [facility] Patient Assistance Program.   Application submitted via e-fax to [phone]   [employer]'s phone number [phone].   I will continue to check the status until final determination.    Berdine Addison, [place] [facility] Patient [place]  [phone] (phone) [phone] (fax) 05/28/2022 8:48 AM

## 2022-05-31 ENCOUNTER — Encounter: Payer: Self-pay | Admitting: Family

## 2022-05-31 NOTE — Telephone Encounter (Signed)
Pt was scheduled for to CT at [facility] on Monday 06/07/2022 at 1:00 PM. Pt to arrive at 11:00 AM. Nothing to eat or drink at 4 hours prior. 9:00 AM. Pt made aware.  Address provided.  [place] [address]: [id number]: Pt verbalized understanding with all questions answered.

## 2022-06-06 ENCOUNTER — Ambulatory Visit (HOSPITAL_BASED_OUTPATIENT_CLINIC_OR_DEPARTMENT_OTHER): Payer: Medicaid Other

## 2022-06-07 ENCOUNTER — Other Ambulatory Visit: Payer: Medicaid Other

## 2022-06-14 ENCOUNTER — Telehealth: Payer: Self-pay | Admitting: Nurse Practitioner

## 2022-06-14 NOTE — Telephone Encounter (Signed)
Tabitha Estrada, pls contact patient for update and verify if she had CTAP done which was scheduled 06/06/2022 at [facility]. Refer to last office visit and lab result notes 05/17/2022. THX

## 2022-06-14 NOTE — Telephone Encounter (Signed)
Tabitha Estrada the pt cancelled the appt for CT scan. She did not complete.  Left message on machine to call back

## 2022-06-25 ENCOUNTER — Other Ambulatory Visit (HOSPITAL_COMMUNITY): Payer: Self-pay

## 2022-06-29 NOTE — Telephone Encounter (Signed)
Called and left VM for patient to call me back. [employer] cannot finish processing application until 06/30/22 when patient no longer has insurance. Calling first thing in AM of 06/30/22 to initiate final processing.    Ardeen Fillers, CPhT Oncology Pharmacy Patient Advocate  [facility]  [phone] (phone) [phone] (fax) 06/29/2022 8:26 AM

## 2022-06-30 ENCOUNTER — Other Ambulatory Visit (HOSPITAL_COMMUNITY): Payer: Self-pay

## 2022-06-30 ENCOUNTER — Encounter: Payer: Self-pay | Admitting: Family

## 2022-06-30 NOTE — Telephone Encounter (Signed)
I called patient to confirm cancellation of insurance and patient informed me they were extending her coverage through the end of June2024. Called [employer] and placed application on hold and will revisit if needed when insurance policy terminates.    Ardeen Fillers, CPhT Oncology Pharmacy Patient Advocate  [facility]  [phone] (phone) [phone] (fax) 06/30/2022 8:58 AM

## 2022-07-21 ENCOUNTER — Encounter: Payer: Self-pay | Admitting: Hematology & Oncology

## 2022-07-21 ENCOUNTER — Other Ambulatory Visit: Payer: Self-pay | Admitting: *Deleted

## 2022-07-21 DIAGNOSIS — C9210 Chronic myeloid leukemia, BCR/ABL-positive, not having achieved remission: Secondary | ICD-10-CM

## 2022-07-21 DIAGNOSIS — K591 Functional diarrhea: Secondary | ICD-10-CM

## 2022-07-21 MED ORDER — DIPHENOXYLATE-ATROPINE 2.5-0.025 MG PO TABS: 1.0000 | ORAL_TABLET | Freq: Four times a day (QID) | ORAL | 1 refills | Status: DC | PRN

## 2022-08-02 ENCOUNTER — Other Ambulatory Visit (HOSPITAL_COMMUNITY): Payer: Self-pay

## 2022-08-02 ENCOUNTER — Telehealth: Payer: Self-pay

## 2022-08-02 NOTE — Telephone Encounter (Signed)
Oral Oncology Patient Advocate Encounter   Received notification that patient was no longer insured.  Called [employer] Patient Assistance Program and re-initiated Patient Assistance Application.   [employer]'s phone number [phone]  [employer]'s fax number [phone]  Representative requested prescription for Iclusig be e-scribed to [facility].   I will continue to follow and update until final determination.    Ardeen Fillers, CPhT Oncology Pharmacy Patient Advocate  [facility]  [phone] (phone) [phone] (fax) 08/02/2022 11:48 AM

## 2022-08-03 ENCOUNTER — Other Ambulatory Visit: Payer: Self-pay

## 2022-08-03 DIAGNOSIS — C9210 Chronic myeloid leukemia, BCR/ABL-positive, not having achieved remission: Secondary | ICD-10-CM

## 2022-08-03 MED ORDER — PONATINIB HCL 15 MG PO TABS: 15.0000 mg | ORAL_TABLET | Freq: Every day | ORAL | 4 refills | Status: DC

## 2022-08-03 NOTE — Telephone Encounter (Signed)
Iclusig Script has been sent to [employer]	[place] so application for [employer] Patient Assistance Program can continue processing. I will continue and follow and update until final determination.   [employer]'s phone number [phone].   Ardeen Fillers, CPhT Oncology Pharmacy Patient Advocate  [facility]  [phone] (phone) [phone] (fax) 08/03/2022 11:23 AM

## 2022-08-05 NOTE — Telephone Encounter (Signed)
Received call from [facility] requesting patient call them back at [phone] option 2 to answer financial eligibility questions. I called and spoke with patient and provided them with the phone number given to call and complete financial questionnaire. Patient expressed understanding and was agreeable to call them to finish questionnaire. I will continue to follow and update until final determination.    Ardeen Fillers, CPhT Oncology Pharmacy Patient Advocate  [facility]  [phone] (phone) [phone] (fax) 08/05/2022 10:45 AM

## 2022-08-05 NOTE — Telephone Encounter (Signed)
Oral Oncology Patient Advocate Encounter   Received notification that the application for assistance for Iclusig through [facility] Patient Assistance Program has been approved.   [employer] phone number [phone].   Effective dates: 08/05/22 through 08/05/23   Ardeen Fillers, CPhT Oncology Pharmacy Patient Advocate  [facility]  [phone] (phone) [phone] (fax) 08/05/2022 2:34 PM

## 2022-08-06 ENCOUNTER — Ambulatory Visit: Payer: Medicaid Other | Admitting: Family

## 2022-08-06 ENCOUNTER — Other Ambulatory Visit: Payer: Medicaid Other

## 2022-08-06 NOTE — Telephone Encounter (Signed)
Patient returned my call. Patient has already scheduled medication for delivery from PAP for Friday, 08/13/22. Patient also knows to call me at [phone] with any questions or concerns regarding receiving medication from PAP program.   Ardeen Fillers, CPhT Oncology Pharmacy Patient Advocate  [facility]  [phone] (phone) [phone] (fax) 08/06/2022 10:16 AM

## 2022-08-06 NOTE — Telephone Encounter (Signed)
Left VM for patient to call me back to inform them of PAP approval and to go over next steps. I will continue to reach out.   1st attempt - LVM 08/06/22   Ardeen Fillers, CPhT Oncology Pharmacy Patient Advocate  [facility]  [phone] (phone) [phone] (fax) 08/06/2022 8:16 AM

## 2022-08-18 ENCOUNTER — Encounter: Payer: Self-pay | Admitting: Hematology & Oncology

## 2022-08-25 ENCOUNTER — Inpatient Hospital Stay: Payer: Medicaid Other

## 2022-08-25 ENCOUNTER — Ambulatory Visit: Payer: Self-pay | Admitting: Family

## 2022-09-20 ENCOUNTER — Encounter: Payer: Self-pay | Admitting: Hematology & Oncology

## 2022-09-29 ENCOUNTER — Encounter: Payer: Self-pay | Admitting: Family

## 2022-09-29 ENCOUNTER — Inpatient Hospital Stay: Payer: Self-pay | Attending: Hematology & Oncology

## 2022-09-29 ENCOUNTER — Inpatient Hospital Stay (HOSPITAL_BASED_OUTPATIENT_CLINIC_OR_DEPARTMENT_OTHER): Payer: Medicaid Other | Admitting: Family

## 2022-09-29 VITALS — BP 132/58 | HR 72 | Temp 98.9°F | Resp 18 | Wt 248.4 lb

## 2022-09-29 DIAGNOSIS — D508 Other iron deficiency anemias: Secondary | ICD-10-CM

## 2022-09-29 DIAGNOSIS — D509 Iron deficiency anemia, unspecified: Secondary | ICD-10-CM | POA: Insufficient documentation

## 2022-09-29 DIAGNOSIS — B3731 Acute candidiasis of vulva and vagina: Secondary | ICD-10-CM | POA: Insufficient documentation

## 2022-09-29 DIAGNOSIS — C9210 Chronic myeloid leukemia, BCR/ABL-positive, not having achieved remission: Secondary | ICD-10-CM | POA: Insufficient documentation

## 2022-09-29 DIAGNOSIS — J329 Chronic sinusitis, unspecified: Secondary | ICD-10-CM | POA: Insufficient documentation

## 2022-09-29 LAB — CBC WITH DIFFERENTIAL (CANCER CENTER ONLY)
Abs Immature Granulocytes: 0.04 10*3/uL (ref 0.00–0.07)
Basophils Absolute: 0 10*3/uL (ref 0.0–0.1)
Basophils Relative: 0 %
Eosinophils Absolute: 0 10*3/uL (ref 0.0–0.5)
Eosinophils Relative: 0 %
HCT: 39.3 % (ref 36.0–46.0)
Hemoglobin: 12.7 g/dL (ref 12.0–15.0)
Immature Granulocytes: 0 %
Lymphocytes Relative: 20 %
Lymphs Abs: 1.8 10*3/uL (ref 0.7–4.0)
MCH: 29.1 pg (ref 26.0–34.0)
MCHC: 32.3 g/dL (ref 30.0–36.0)
MCV: 90.1 fL (ref 80.0–100.0)
Monocytes Absolute: 0.4 10*3/uL (ref 0.1–1.0)
Monocytes Relative: 4 %
Neutro Abs: 6.9 10*3/uL (ref 1.7–7.7)
Neutrophils Relative %: 76 %
Platelet Count: 135 10*3/uL — ABNORMAL LOW (ref 150–400)
RBC: 4.36 MIL/uL (ref 3.87–5.11)
RDW: 14.1 % (ref 11.5–15.5)
WBC Count: 9.1 10*3/uL (ref 4.0–10.5)
nRBC: 0 % (ref 0.0–0.2)

## 2022-09-29 LAB — CMP (CANCER CENTER ONLY)
ALT: 17 U/L (ref 0–44)
AST: 12 U/L — ABNORMAL LOW (ref 15–41)
Albumin: 3.7 g/dL (ref 3.5–5.0)
Alkaline Phosphatase: 142 U/L — ABNORMAL HIGH (ref 38–126)
Anion gap: 8 (ref 5–15)
BUN: 9 mg/dL (ref 6–20)
CO2: 29 mmol/L (ref 22–32)
Calcium: 9.1 mg/dL (ref 8.9–10.3)
Chloride: 105 mmol/L (ref 98–111)
Creatinine: 1.08 mg/dL — ABNORMAL HIGH (ref 0.44–1.00)
GFR, Estimated: >60 mL/min (ref >60)
Glucose, Bld: 75 mg/dL (ref 70–99)
Potassium: 3.9 mmol/L (ref 3.5–5.1)
Sodium: 142 mmol/L (ref 135–145)
Total Bilirubin: 0.5 mg/dL (ref 0.3–1.2)
Total Protein: 6.8 g/dL (ref 6.5–8.1)

## 2022-09-29 LAB — IRON AND IRON BINDING CAPACITY (CC-WL,HP ONLY)
Iron: 36 ug/dL (ref 28–170)
Saturation Ratios: 14 % (ref 10.4–31.8)
TIBC: 265 ug/dL (ref 250–450)
UIBC: 229 ug/dL (ref 148–442)

## 2022-09-29 LAB — LACTATE DEHYDROGENASE: LDH: 227 U/L — ABNORMAL HIGH (ref 98–192)

## 2022-09-29 LAB — SAVE SMEAR(SSMR), FOR PROVIDER SLIDE REVIEW

## 2022-09-29 LAB — FERRITIN: Ferritin: 96 ng/mL (ref 11–307)

## 2022-09-29 MED ORDER — AZITHROMYCIN 250 MG PO TABS: ORAL_TABLET | ORAL | 0 refills | Status: DC

## 2022-09-29 MED ORDER — FLUCONAZOLE 150 MG PO TABS
150.0000 mg | ORAL_TABLET | Freq: Every day | ORAL | 0 refills | Status: DC
Start: 2022-09-29 — End: 2023-08-04

## 2022-09-29 NOTE — Progress Notes (Signed)
Hematology and Oncology Follow Up Visit  Tabitha Estrada [id number] [birth year] 52 y.o. 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    Past Therapy: Gleevec 400 mg po q day -- D/C'd on 06/15/2018 for periorbital edema  Sprycel 100 mg PO every other day (changed 08/04/2018) - d/c on 05/01/2019 Bosulif 200 mg po q day -- start on 05/07/2019 - patient stopped 06/26/2019 due to diarrhea   Past Therapy: Tasigna 50 mg po q day -- start in 11/2019   -- d/c on 04/09/2020 Scemblix  20 mg po day -- start on 02/19/2021 --DC due to intolerance Hydrea 500 mg po BID -- start on 04/02/2021   Current Therapy:        Ponatinib 15 mg po q day -- start on 05/08/2021 -- hold on 06/15/2021 - restart on 08/12/2021   Interim History:  Tabitha Estrada is here today for follow-up. She is symptomatic with a sinus infection and fatigue. She states that this is exacerbated by the hot weather. She has sinus congestion and drainage. She feels pressure under her eyes and yes are puffy.  Cough has been keeping her up at night.  No fever, chills, rash, dizziness, chest pain, palpitations, abdominal pain or changes in bowel or bladder habits.  She states that she also has a vaginal yeast infection.  She states that she follows up with her cardiologist in August.  She is still smoking daily.  No blood loss noted. No bruising or petechiae.  She takes lomotil as needed for occasional episodes of diarrhea.  She also has occasional episodes of nausea without vomiting.  BCR/ABL back in February was 0.0558%, MMR.  Mild swelling in the lower extremities unchanged from baseline.  She note intermittent numbness and tingling in her right foot.  No falls or syncope reported.  Appetite is fair and she is doing her best to stay well hydrated. Her weight is 248 lbs.   ECOG Performance Status: 1 - Symptomatic but completely ambulatory  Medications:  Allergies as of 09/29/2022       Reactions    Dulaglutide Nausea And Vomiting, Other (See Comments)   Elemental Sulfur Nausea And Vomiting   Liraglutide Nausea And Vomiting   Metformin Diarrhea   Sulfamethoxazole Nausea And Vomiting        Medication List        Accurate as of September 29, 2022  2:11 PM. If you have any questions, ask your nurse or doctor.          acetaminophen 500 MG tablet Commonly known as: TYLENOL Take 1,000 mg by mouth every 6 (six) hours as needed for moderate pain.   albuterol 108 (90 Base) MCG/ACT inhaler Commonly known as: VENTOLIN HFA Inhale 1-2 puffs into the lungs every 4 (four) hours as needed for wheezing or shortness of breath.   albuterol (2.5 MG/3ML) 0.083% nebulizer solution Commonly known as: PROVENTIL Inhale the contents of one vial in nebulizer every four to six hours as needed for cough or wheeze.   Aspirin Low Dose 81 MG tablet Generic drug: aspirin EC Take 81 mg by mouth daily.   atorvastatin 80 MG tablet Commonly known as: LIPITOR TAKE 1 TABLET (80 MG TOTAL) BY MOUTH DAILY. NEEDS LAB WORK   Baqsimi One Pack 3 MG/DOSE Powd Generic drug: Glucagon Place 1 spray into both nostrils as needed for low blood sugar.   budesonide-formoterol 160-4.5 MCG/ACT inhaler Commonly known as: SYMBICORT Inhale 2 puffs into the lungs  2 (two) times daily.   cefdinir 300 MG capsule Commonly known as: OMNICEF Take 2 capsules (600 mg total) by mouth daily.   clindamycin 300 MG capsule Commonly known as: CLEOCIN Take 300 mg by mouth 3 (three) times daily.   colestipol 1 g tablet Commonly known as: COLESTID Take 1 tablet (1 g total) by mouth every morning.   cyanocobalamin 1000 MCG tablet Commonly known as: VITAMIN B12 Take 1,000 mcg by mouth daily.   cyclobenzaprine 10 MG tablet Commonly known as: FLEXERIL Take 10 mg by mouth daily as needed for muscle spasms.   Dexcom G6 Sensor Misc Apply topically.   diphenoxylate-atropine 2.5-0.025 MG tablet Commonly known as: LOMOTIL Take 1  tablet by mouth 4 (four) times daily as needed for diarrhea or loose stools (as needed after taking Sprycel for diarrhea).   ezetimibe 10 MG tablet Commonly known as: ZETIA Take by mouth.   fluticasone 50 MCG/ACT nasal spray Commonly known as: FLONASE Use two sprays in each nostril once daily as directed.   folic acid 1 MG tablet Commonly known as: FOLVITE Take 1 mg by mouth daily.   ibuprofen 200 MG tablet Commonly known as: ADVIL Take 600 mg by mouth every 6 (six) hours as needed for moderate pain.   insulin aspart 100 UNIT/ML FlexPen Commonly known as: NovoLOG FlexPen Inject 40 Units into the skin daily with supper. What changed:  how much to take when to take this additional instructions   meclizine 25 MG tablet Commonly known as: ANTIVERT Take 1 tablet (25 mg total) by mouth 3 (three) times daily as needed for dizziness.   montelukast 10 MG tablet Commonly known as: SINGULAIR TAKE 1 TABLET (10 MG TOTAL) BY MOUTH DAILY   mupirocin ointment 2 % Commonly known as: BACTROBAN Apply 1 application. topically daily.   omeprazole 40 MG capsule Commonly known as: PRILOSEC Take 40 mg by mouth 2 (two) times daily.   ondansetron 4 MG disintegrating tablet Commonly known as: ZOFRAN-ODT Take 1 tablet (4 mg total) by mouth every 8 (eight) hours as needed for nausea or vomiting.   OXYGEN Inhale into the lungs. Pt reports 2-3 lpm continuous.   PONATinib HCl 15 MG tablet Commonly known as: ICLUSIG Take 1 tablet (15 mg total) by mouth daily.   potassium chloride 20 MEQ packet Commonly known as: KLOR-CON Take 20 mEq by mouth daily.   potassium chloride SA 20 MEQ tablet Commonly known as: KLOR-CON M Take 1 tablet (20 mEq total) by mouth daily.   potassium chloride SA 20 MEQ tablet Commonly known as: KLOR-CON M Take 1 tablet (20 mEq total) by mouth 2 (two) times daily for 7 days.   pregabalin 200 MG capsule Commonly known as: LYRICA Take 200 mg by mouth 2 (two) times  daily.   torsemide 20 MG tablet Commonly known as: DEMADEX Take 20 mg by mouth daily.   TOUJEO SOLOSTAR [place] Inject 50 Units into the skin. If pump fails   valACYclovir 500 MG tablet Commonly known as: VALTREX TAKE ONE TABLET BY MOUTH AT BEDTIME   valsartan 40 MG tablet Commonly known as: DIOVAN Take 40 mg by mouth daily.        Allergies:  Allergies  Allergen Reactions   Dulaglutide Nausea And Vomiting and Other (See Comments)   Elemental Sulfur Nausea And Vomiting   Liraglutide Nausea And Vomiting   Metformin Diarrhea   Sulfamethoxazole Nausea And Vomiting    Past Medical History, Surgical history, Social history, and Family History  were reviewed and updated.  Review of Systems: All other 10 point review of systems is negative.   Physical Exam:  weight is 248 lb 6.4 oz (112.7 kg). Her oral temperature is 98.9 F (37.2 C). Her blood pressure is 132/58 (abnormal) and her pulse is 72. Her respiration is 18 and oxygen saturation is 96%.   Wt Readings from Last 3 Encounters:  09/29/22 248 lb 6.4 oz (112.7 kg)  05/17/22 236 lb (107 kg)  04/07/22 245 lb 1.3 oz (111.2 kg)    Ocular: Sclerae unicteric, pupils equal, round and reactive to light Ear-nose-throat: Oropharynx clear, dentition fair Lymphatic: No cervical or supraclavicular adenopathy Lungs no rales or rhonchi, good excursion bilaterally Heart regular rate and rhythm, no murmur appreciated Abd soft, nontender, positive bowel sounds MSK no focal spinal tenderness, no joint edema Neuro: non-focal, well-oriented, appropriate affect Breasts: Deferred   Lab Results  Component Value Date   WBC 9.1 09/29/2022   HGB 12.7 09/29/2022   HCT 39.3 09/29/2022   MCV 90.1 09/29/2022   PLT 135 (L) 09/29/2022   Lab Results  Component Value Date   FERRITIN 195 04/07/2022   IRON 46 04/07/2022   TIBC 274 04/07/2022   UIBC 228 04/07/2022   IRONPCTSAT 17 04/07/2022   Lab Results  Component Value Date   RETICCTPCT  1.5 08/29/2020   RBC 4.36 09/29/2022   No results found for: "KPAFRELGTCHN", "LAMBDASER", "KAPLAMBRATIO" Lab Results  Component Value Date   IGGSERUM 675 (L) 11/10/2016   IGMSERUM 86 11/10/2016   No results found for: "TOTALPROTELP", "ALBUMINELP", "A1GS", "A2GS", "BETS", "BETA2SER", "GAMS", "MSPIKE", "SPEI"   Chemistry      Component Value Date/Time   NA 142 09/29/2022 1321   NA 144 01/27/2017 1302   NA 138 09/16/2016 1105   K 3.9 09/29/2022 1321   K 4.3 01/27/2017 1302   K 4.2 09/16/2016 1105   CL 105 09/29/2022 1321   CL 102 01/27/2017 1302   CO2 29 09/29/2022 1321   CO2 27 01/27/2017 1302   CO2 26 09/16/2016 1105   BUN 9 09/29/2022 1321   BUN 14 01/27/2017 1302   BUN 11.9 09/16/2016 1105   CREATININE 1.08 (H) 09/29/2022 1321   CREATININE 1.1 01/27/2017 1302   CREATININE 0.8 09/16/2016 1105      Component Value Date/Time   CALCIUM 9.1 09/29/2022 1321   CALCIUM 9.5 01/27/2017 1302   CALCIUM 9.0 09/16/2016 1105   ALKPHOS 142 (H) 09/29/2022 1321   ALKPHOS 116 (H) 01/27/2017 1302   ALKPHOS 136 09/16/2016 1105   AST 12 (L) 09/29/2022 1321   AST 8 09/16/2016 1105   ALT 17 09/29/2022 1321   ALT 25 01/27/2017 1302   ALT 12 09/16/2016 1105   BILITOT 0.5 09/29/2022 1321   BILITOT 0.32 09/16/2016 1105       Impression and Plan: Tabitha Estrada is a very pleasant 52 yo with chronic phase CML. She has had a hard time with all of the TKI medications.  She continues to tolerate Iclusig nicely.  BCR/ABL and Iron studies are pending. Prescriptions sent for Z pack and diflucan for sinus infection and vaginal yeast infection. She states that she will contact our office if her symptoms persist.  Follow-up in 4 months.    Eileen Stanford, NP 7/31/20242:11 PM

## 2022-09-30 ENCOUNTER — Encounter: Payer: Self-pay | Admitting: Family

## 2022-10-08 ENCOUNTER — Other Ambulatory Visit: Payer: Self-pay | Admitting: *Deleted

## 2022-10-08 DIAGNOSIS — C9210 Chronic myeloid leukemia, BCR/ABL-positive, not having achieved remission: Secondary | ICD-10-CM

## 2022-10-08 DIAGNOSIS — E559 Vitamin D deficiency, unspecified: Secondary | ICD-10-CM

## 2022-10-08 DIAGNOSIS — D508 Other iron deficiency anemias: Secondary | ICD-10-CM

## 2022-10-08 DIAGNOSIS — E538 Deficiency of other specified B group vitamins: Secondary | ICD-10-CM

## 2022-10-12 ENCOUNTER — Encounter: Payer: Self-pay | Admitting: Hematology & Oncology

## 2022-10-14 ENCOUNTER — Inpatient Hospital Stay: Payer: Self-pay | Attending: Hematology & Oncology

## 2022-10-14 DIAGNOSIS — D508 Other iron deficiency anemias: Secondary | ICD-10-CM

## 2022-10-14 DIAGNOSIS — E538 Deficiency of other specified B group vitamins: Secondary | ICD-10-CM

## 2022-10-14 DIAGNOSIS — C9210 Chronic myeloid leukemia, BCR/ABL-positive, not having achieved remission: Secondary | ICD-10-CM

## 2022-10-14 DIAGNOSIS — E559 Vitamin D deficiency, unspecified: Secondary | ICD-10-CM

## 2022-10-19 LAB — BCR/ABL

## 2022-10-20 ENCOUNTER — Encounter: Payer: Self-pay | Admitting: *Deleted

## 2023-01-26 ENCOUNTER — Inpatient Hospital Stay: Payer: Self-pay

## 2023-01-26 ENCOUNTER — Ambulatory Visit: Payer: Self-pay | Admitting: Hematology & Oncology

## 2023-01-28 ENCOUNTER — Other Ambulatory Visit: Payer: Self-pay

## 2023-01-28 ENCOUNTER — Ambulatory Visit: Payer: Self-pay | Admitting: Hematology & Oncology

## 2023-03-01 ENCOUNTER — Other Ambulatory Visit: Payer: Self-pay

## 2023-03-01 DIAGNOSIS — C9210 Chronic myeloid leukemia, BCR/ABL-positive, not having achieved remission: Secondary | ICD-10-CM

## 2023-03-01 MED ORDER — PONATINIB HCL 15 MG PO TABS: 15.0000 mg | ORAL_TABLET | Freq: Every day | ORAL | 4 refills | Status: DC

## 2023-03-24 ENCOUNTER — Inpatient Hospital Stay (HOSPITAL_BASED_OUTPATIENT_CLINIC_OR_DEPARTMENT_OTHER): Payer: Self-pay | Admitting: Medical Oncology

## 2023-03-24 ENCOUNTER — Inpatient Hospital Stay: Payer: Self-pay | Attending: Hematology & Oncology

## 2023-03-24 ENCOUNTER — Encounter: Payer: Self-pay | Admitting: Medical Oncology

## 2023-03-24 VITALS — BP 123/74 | HR 70 | Temp 97.8°F | Resp 20 | Ht 64.0 in | Wt 258.1 lb

## 2023-03-24 DIAGNOSIS — R053 Chronic cough: Secondary | ICD-10-CM | POA: Insufficient documentation

## 2023-03-24 DIAGNOSIS — D509 Iron deficiency anemia, unspecified: Secondary | ICD-10-CM | POA: Insufficient documentation

## 2023-03-24 DIAGNOSIS — R109 Unspecified abdominal pain: Secondary | ICD-10-CM

## 2023-03-24 DIAGNOSIS — F17210 Nicotine dependence, cigarettes, uncomplicated: Secondary | ICD-10-CM | POA: Insufficient documentation

## 2023-03-24 DIAGNOSIS — K529 Noninfective gastroenteritis and colitis, unspecified: Secondary | ICD-10-CM | POA: Insufficient documentation

## 2023-03-24 DIAGNOSIS — D508 Other iron deficiency anemias: Secondary | ICD-10-CM

## 2023-03-24 DIAGNOSIS — C9210 Chronic myeloid leukemia, BCR/ABL-positive, not having achieved remission: Secondary | ICD-10-CM | POA: Insufficient documentation

## 2023-03-24 LAB — CMP (CANCER CENTER ONLY)
ALT: 10 U/L (ref 0–44)
AST: 5 U/L — ABNORMAL LOW (ref 15–41)
Albumin: 4.2 g/dL (ref 3.5–5.0)
Alkaline Phosphatase: 140 U/L — ABNORMAL HIGH (ref 38–126)
Anion gap: 9 (ref 5–15)
BUN: 16 mg/dL (ref 6–20)
CO2: 31 mmol/L (ref 22–32)
Calcium: 9.5 mg/dL (ref 8.9–10.3)
Chloride: 102 mmol/L (ref 98–111)
Creatinine: 1.15 mg/dL — ABNORMAL HIGH (ref 0.44–1.00)
GFR, Estimated: 58 mL/min — ABNORMAL LOW (ref >60)
Glucose, Bld: 160 mg/dL — ABNORMAL HIGH (ref 70–99)
Potassium: 4.5 mmol/L (ref 3.5–5.1)
Sodium: 142 mmol/L (ref 135–145)
Total Bilirubin: 0.5 mg/dL (ref 0.0–1.2)
Total Protein: 6.7 g/dL (ref 6.5–8.1)

## 2023-03-24 LAB — CBC WITH DIFFERENTIAL (CANCER CENTER ONLY)
Abs Immature Granulocytes: 0.12 10*3/uL — ABNORMAL HIGH (ref 0.00–0.07)
Basophils Absolute: 0 10*3/uL (ref 0.0–0.1)
Basophils Relative: 0 %
Eosinophils Absolute: 0.1 10*3/uL (ref 0.0–0.5)
Eosinophils Relative: 1 %
HCT: 39.9 % (ref 36.0–46.0)
Hemoglobin: 12.8 g/dL (ref 12.0–15.0)
Immature Granulocytes: 2 %
Lymphocytes Relative: 16 %
Lymphs Abs: 1.3 10*3/uL (ref 0.7–4.0)
MCH: 28.4 pg (ref 26.0–34.0)
MCHC: 32.1 g/dL (ref 30.0–36.0)
MCV: 88.7 fL (ref 80.0–100.0)
Monocytes Absolute: 0.3 10*3/uL (ref 0.1–1.0)
Monocytes Relative: 4 %
Neutro Abs: 6.3 10*3/uL (ref 1.7–7.7)
Neutrophils Relative %: 77 %
Platelet Count: 147 10*3/uL — ABNORMAL LOW (ref 150–400)
RBC: 4.5 MIL/uL (ref 3.87–5.11)
RDW: 14.6 % (ref 11.5–15.5)
WBC Count: 8.2 10*3/uL (ref 4.0–10.5)
nRBC: 0 % (ref 0.0–0.2)

## 2023-03-24 LAB — RETICULOCYTES
Immature Retic Fract: 12.2 % (ref 2.3–15.9)
RBC.: 4.51 MIL/uL (ref 3.87–5.11)
Retic Count, Absolute: 99.7 10*3/uL (ref 19.0–186.0)
Retic Ct Pct: 2.2 % (ref 0.4–3.1)

## 2023-03-24 LAB — IRON AND IRON BINDING CAPACITY (CC-WL,HP ONLY)
Iron: 63 ug/dL (ref 28–170)
Saturation Ratios: 20 % (ref 10.4–31.8)
TIBC: 315 ug/dL (ref 250–450)
UIBC: 252 ug/dL (ref 148–442)

## 2023-03-24 LAB — LIPASE, BLOOD: Lipase: 23 U/L (ref 11–51)

## 2023-03-24 LAB — FERRITIN: Ferritin: 120 ng/mL (ref 11–307)

## 2023-03-24 LAB — SAVE SMEAR(SSMR), FOR PROVIDER SLIDE REVIEW

## 2023-03-24 NOTE — Progress Notes (Signed)
Hematology and Oncology Follow Up Visit  Tabitha Estrada [id number] 1970-03-17 53 y.o. 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 Tener is here today for follow-up.   She does have diarrhea from her medications.  She is seeing a pulmonologist for her chronic cough.  No fever, chills, rash, dizziness, chest pain, palpitations, new abdominal pain or changes in bowel or bladder habits. She does report some chronic abdominal discomfort at times. Usually in the upper abdomen. She is due for her next colonoscopy this year she believes.  She states that she follows up with her cardiologist in August- once yearly  She is still smoking daily about 1 pack per day.  No blood loss noted. No bruising or petechiae.  She takes lomotil as needed for occasional episodes of diarrhea.  She also has occasional episodes of nausea without vomiting.  BCR/ABL back in February was 0.0558%, MMR.  Mild swelling in the lower extremities unchanged from baseline.  She note intermittent numbness and tingling in her right foot.  No falls or syncope reported.  Appetite is fair and she is doing her best to stay well hydrated. Wt Readings from Last 3 Encounters:  03/24/23 258 lb 1.3 oz (117.1 kg)  09/29/22 248 lb 6.4 oz (112.7 kg)  05/17/22 236 lb (107 kg)   ECOG Performance Status: 1 -  Symptomatic but completely ambulatory  Medications:  Allergies as of 03/24/2023       Reactions   Dulaglutide Nausea And Vomiting, Other (See Comments)   Elemental Sulfur Nausea And Vomiting   Liraglutide Nausea And Vomiting   Metformin Diarrhea   Sulfamethoxazole Nausea And Vomiting        Medication List        Accurate as of March 24, 2023 12:41 PM. If you have any questions, ask your nurse or doctor.          STOP taking these medications    azithromycin 250 MG tablet Commonly known as: Zithromax Z-Pak Stopped by: Rushie Chestnut   potassium chloride SA 20 MEQ tablet Commonly known as: KLOR-CON M Stopped by: Rushie Chestnut       TAKE these medications    acetaminophen 500 MG tablet Commonly known as: TYLENOL Take 1,000 mg by mouth every 6 (six) hours as needed for moderate pain.   albuterol 108 (90 Base) MCG/ACT inhaler Commonly known as: VENTOLIN HFA Inhale 1-2 puffs into the lungs every 4 (four) hours as needed for wheezing or shortness of breath.   albuterol (2.5 MG/3ML) 0.083% nebulizer solution Commonly known as: PROVENTIL Inhale the contents of one vial in nebulizer every four to six hours as needed for cough or wheeze.   Aspirin Low Dose 81 MG tablet Generic  drug: aspirin EC Take 81 mg by mouth daily.   atorvastatin 80 MG tablet Commonly known as: LIPITOR TAKE 1 TABLET (80 MG TOTAL) BY MOUTH DAILY. NEEDS LAB WORK   Baqsimi One Pack 3 MG/DOSE Powd Generic drug: Glucagon Place 1 spray into both nostrils as needed for low blood sugar.   budesonide-formoterol 160-4.5 MCG/ACT inhaler Commonly known as: SYMBICORT Inhale 2 puffs into the lungs 2 (two) times daily.   colestipol 1 g tablet Commonly known as: COLESTID Take 1 tablet (1 g total) by mouth every morning.   cyanocobalamin 1000 MCG tablet Commonly known as: VITAMIN B12 Take 1,000 mcg by mouth daily.   cyclobenzaprine 10 MG tablet Commonly known as: FLEXERIL Take 10 mg by  mouth daily as needed for muscle spasms.   Dexcom G6 Sensor Misc Apply topically.   diphenoxylate-atropine 2.5-0.025 MG tablet Commonly known as: LOMOTIL Take 1 tablet by mouth 4 (four) times daily as needed for diarrhea or loose stools (as needed after taking Sprycel for diarrhea).   ezetimibe 10 MG tablet Commonly known as: ZETIA Take by mouth.   fluconazole 150 MG tablet Commonly known as: Diflucan Take 1 tablet (150 mg total) by mouth daily.   fluticasone 50 MCG/ACT nasal spray Commonly known as: FLONASE Use two sprays in each nostril once daily as directed.   folic acid 1 MG tablet Commonly known as: FOLVITE Take 1 mg by mouth daily.   ibuprofen 200 MG tablet Commonly known as: ADVIL Take 600 mg by mouth every 6 (six) hours as needed for moderate pain.   insulin aspart 100 UNIT/ML FlexPen Commonly known as: NovoLOG FlexPen Inject 40 Units into the skin daily with supper. What changed:  how much to take when to take this additional instructions   ipratropium-albuterol 0.5-2.5 (3) MG/3ML Soln Commonly known as: DUONEB Inhale 3 mLs into the lungs every 6 (six) hours.   losartan 25 MG tablet Commonly known as: COZAAR Take 1 tablet by mouth daily.   meclizine 25 MG tablet Commonly known as: ANTIVERT Take 1 tablet (25 mg total) by mouth 3 (three) times daily as needed for dizziness.   methocarbamol 500 MG tablet Commonly known as: ROBAXIN Take 500 mg by mouth 3 (three) times daily.   montelukast 10 MG tablet Commonly known as: SINGULAIR TAKE 1 TABLET (10 MG TOTAL) BY MOUTH DAILY   mupirocin ointment 2 % Commonly known as: BACTROBAN Apply 1 application. topically daily.   omeprazole 40 MG capsule Commonly known as: PRILOSEC Take 40 mg by mouth 2 (two) times daily.   ondansetron 4 MG disintegrating tablet Commonly known as: ZOFRAN-ODT Take 1 tablet (4 mg total) by mouth every 8 (eight) hours as needed for nausea or vomiting.   OXYGEN Inhale into the  lungs. Pt reports 2-3 lpm continuous.   PONATinib HCl 15 MG tablet Commonly known as: ICLUSIG Take 1 tablet (15 mg total) by mouth daily.   potassium chloride 20 MEQ packet Commonly known as: KLOR-CON Take 20 mEq by mouth daily.   pregabalin 200 MG capsule Commonly known as: LYRICA Take 200 mg by mouth 2 (two) times daily.   torsemide 20 MG tablet Commonly known as: DEMADEX Take 20 mg by mouth daily.   TOUJEO SOLOSTAR [place] Inject 50 Units into the skin. If pump fails   Trelegy Ellipta 100-62.5-25 MCG/ACT Aepb Generic drug: Fluticasone-Umeclidin-Vilant Inhale 1 puff into the lungs daily.   valACYclovir 500 MG tablet Commonly known as: VALTREX TAKE ONE TABLET BY MOUTH AT BEDTIME  valsartan 40 MG tablet Commonly known as: DIOVAN Take 40 mg by mouth daily.        Allergies:  Allergies  Allergen Reactions   Dulaglutide Nausea And Vomiting and Other (See Comments)   Elemental Sulfur Nausea And Vomiting   Liraglutide Nausea And Vomiting   Metformin Diarrhea   Sulfamethoxazole Nausea And Vomiting    Past Medical History, Surgical history, Social history, and Family History were reviewed and updated.  Review of Systems: All other 10 point review of systems is negative.   Physical Exam:  height is 5\' 4"  (1.626 m) and weight is 258 lb 1.3 oz (117.1 kg). Her oral temperature is 97.8 F (36.6 C). Her blood pressure is 123/74 and her pulse is 70. Her respiration is 20 and oxygen saturation is 96%.   Wt Readings from Last 3 Encounters:  03/24/23 258 lb 1.3 oz (117.1 kg)  09/29/22 248 lb 6.4 oz (112.7 kg)  05/17/22 236 lb (107 kg)    Ocular: Sclerae unicteric, pupils equal, round and reactive to light Ear-nose-throat: Oropharynx clear, dentition fair Lymphatic: No cervical or supraclavicular adenopathy Lungs no rales or rhonchi, good excursion bilaterally Heart regular rate and rhythm, no murmur appreciated Abd soft, nontender, positive bowel sounds MSK no focal  spinal tenderness, no joint edema Neuro: non-focal, well-oriented, appropriate affect  Lab Results  Component Value Date   WBC 8.2 03/24/2023   HGB 12.8 03/24/2023   HCT 39.9 03/24/2023   MCV 88.7 03/24/2023   PLT 147 (L) 03/24/2023   Lab Results  Component Value Date   FERRITIN 96 09/29/2022   IRON 36 09/29/2022   TIBC 265 09/29/2022   UIBC 229 09/29/2022   IRONPCTSAT 14 09/29/2022   Lab Results  Component Value Date   RETICCTPCT 2.2 03/24/2023   RBC 4.51 03/24/2023   No results found for: "KPAFRELGTCHN", "LAMBDASER", "KAPLAMBRATIO" Lab Results  Component Value Date   IGGSERUM 675 (L) 11/10/2016   IGMSERUM 86 11/10/2016   No results found for: "TOTALPROTELP", "ALBUMINELP", "A1GS", "A2GS", "BETS", "BETA2SER", "GAMS", "MSPIKE", "SPEI"   Chemistry      Component Value Date/Time   NA 142 03/24/2023 1201   NA 144 01/27/2017 1302   NA 138 09/16/2016 1105   K 4.5 03/24/2023 1201   K 4.3 01/27/2017 1302   K 4.2 09/16/2016 1105   CL 102 03/24/2023 1201   CL 102 01/27/2017 1302   CO2 31 03/24/2023 1201   CO2 27 01/27/2017 1302   CO2 26 09/16/2016 1105   BUN 16 03/24/2023 1201   BUN 14 01/27/2017 1302   BUN 11.9 09/16/2016 1105   CREATININE 1.15 (H) 03/24/2023 1201   CREATININE 1.1 01/27/2017 1302   CREATININE 0.8 09/16/2016 1105      Component Value Date/Time   CALCIUM 9.5 03/24/2023 1201   CALCIUM 9.5 01/27/2017 1302   CALCIUM 9.0 09/16/2016 1105   ALKPHOS 140 (H) 03/24/2023 1201   ALKPHOS 116 (H) 01/27/2017 1302   ALKPHOS 136 09/16/2016 1105   AST 5 (L) 03/24/2023 1201   AST 8 09/16/2016 1105   ALT 10 03/24/2023 1201   ALT 25 01/27/2017 1302   ALT 12 09/16/2016 1105   BILITOT 0.5 03/24/2023 1201   BILITOT 0.32 09/16/2016 1105     Encounter Diagnosis  Name Primary?   CML (chronic myelocytic leukemia) (HCC) Yes   Impression and Plan: Ms. Mcclaugherty is a very pleasant 53 yo with chronic phase CML. She has had a hard time with all of the TKI  medications.  She  continues to tolerate Iclusig nicely.   Vitals are stable. No sign of toxicity Will add on a lipase- if abnormal I would suggest a CT of the abdomen. If normal I would like for her to continue to work with her PCP regarding this.  Probiotic may help with her mild chronic diarrhea from her medications.  BCR/ABL and Iron studies are pending. Today CBC and CMP are overall stable.    RTC 4 months MD, labs (CBC w/, CMP, iron, ferritin, save smear, retic, BCR/ABL)  Rushie Chestnut, PA-C 1/23/202512:41 PM

## 2023-03-31 ENCOUNTER — Encounter: Payer: Self-pay | Admitting: Medical Oncology

## 2023-03-31 LAB — BCR-ABL1, CML/ALL, PCR, QUANT
E1A2 Transcript: <0.0032 %
Interpretation (BCRAL):: NEGATIVE
b2a2 transcript: <0.0032 %
b3a2 transcript: <0.0032 %

## 2023-06-14 ENCOUNTER — Encounter: Payer: Self-pay | Admitting: Hematology & Oncology

## 2023-06-15 ENCOUNTER — Other Ambulatory Visit: Payer: Self-pay

## 2023-06-15 DIAGNOSIS — K591 Functional diarrhea: Secondary | ICD-10-CM

## 2023-06-15 DIAGNOSIS — C9210 Chronic myeloid leukemia, BCR/ABL-positive, not having achieved remission: Secondary | ICD-10-CM

## 2023-06-15 MED ORDER — DIPHENOXYLATE-ATROPINE 2.5-0.025 MG PO TABS: 1.0000 | ORAL_TABLET | Freq: Four times a day (QID) | ORAL | 1 refills | Status: AC | PRN

## 2023-07-21 ENCOUNTER — Other Ambulatory Visit: Payer: Self-pay

## 2023-07-21 ENCOUNTER — Inpatient Hospital Stay (HOSPITAL_BASED_OUTPATIENT_CLINIC_OR_DEPARTMENT_OTHER): Payer: Self-pay | Admitting: Hematology & Oncology

## 2023-07-21 ENCOUNTER — Encounter: Payer: Self-pay | Admitting: Hematology & Oncology

## 2023-07-21 ENCOUNTER — Inpatient Hospital Stay: Payer: Self-pay | Attending: Hematology & Oncology

## 2023-07-21 VITALS — BP 116/56 | HR 68 | Temp 98.0°F | Resp 19 | Wt 255.0 lb

## 2023-07-21 DIAGNOSIS — Z79899 Other long term (current) drug therapy: Secondary | ICD-10-CM | POA: Insufficient documentation

## 2023-07-21 DIAGNOSIS — E1165 Type 2 diabetes mellitus with hyperglycemia: Secondary | ICD-10-CM

## 2023-07-21 DIAGNOSIS — C9210 Chronic myeloid leukemia, BCR/ABL-positive, not having achieved remission: Secondary | ICD-10-CM

## 2023-07-21 DIAGNOSIS — D509 Iron deficiency anemia, unspecified: Secondary | ICD-10-CM | POA: Insufficient documentation

## 2023-07-21 DIAGNOSIS — C9211 Chronic myeloid leukemia, BCR/ABL-positive, in remission: Secondary | ICD-10-CM

## 2023-07-21 LAB — CMP (CANCER CENTER ONLY)
ALT: 20 U/L (ref 0–44)
AST: 7 U/L — ABNORMAL LOW (ref 15–41)
Albumin: 3.9 g/dL (ref 3.5–5.0)
Alkaline Phosphatase: 105 U/L (ref 38–126)
Anion gap: 9 (ref 5–15)
BUN: 12 mg/dL (ref 6–20)
CO2: 32 mmol/L (ref 22–32)
Calcium: 8.8 mg/dL — ABNORMAL LOW (ref 8.9–10.3)
Chloride: 100 mmol/L (ref 98–111)
Creatinine: 1.18 mg/dL — ABNORMAL HIGH (ref 0.44–1.00)
GFR, Estimated: 56 mL/min — ABNORMAL LOW (ref >60)
Glucose, Bld: 236 mg/dL — ABNORMAL HIGH (ref 70–99)
Potassium: 4.6 mmol/L (ref 3.5–5.1)
Sodium: 141 mmol/L (ref 135–145)
Total Bilirubin: 0.8 mg/dL (ref 0.0–1.2)
Total Protein: 6.3 g/dL — ABNORMAL LOW (ref 6.5–8.1)

## 2023-07-21 LAB — CBC WITH DIFFERENTIAL (CANCER CENTER ONLY)
Abs Immature Granulocytes: 0.1 10*3/uL — ABNORMAL HIGH (ref 0.00–0.07)
Basophils Absolute: 0 10*3/uL (ref 0.0–0.1)
Basophils Relative: 0 %
Eosinophils Absolute: 0.1 10*3/uL (ref 0.0–0.5)
Eosinophils Relative: 1 %
HCT: 40.1 % (ref 36.0–46.0)
Hemoglobin: 12.7 g/dL (ref 12.0–15.0)
Immature Granulocytes: 1 %
Lymphocytes Relative: 14 %
Lymphs Abs: 1.2 10*3/uL (ref 0.7–4.0)
MCH: 28.3 pg (ref 26.0–34.0)
MCHC: 31.7 g/dL (ref 30.0–36.0)
MCV: 89.5 fL (ref 80.0–100.0)
Monocytes Absolute: 0.3 10*3/uL (ref 0.1–1.0)
Monocytes Relative: 4 %
Neutro Abs: 7 10*3/uL (ref 1.7–7.7)
Neutrophils Relative %: 80 %
Platelet Count: 125 10*3/uL — ABNORMAL LOW (ref 150–400)
RBC: 4.48 MIL/uL (ref 3.87–5.11)
RDW: 16.1 % — ABNORMAL HIGH (ref 11.5–15.5)
WBC Count: 8.6 10*3/uL (ref 4.0–10.5)
nRBC: 0 % (ref 0.0–0.2)

## 2023-07-21 LAB — RETIC PANEL
Immature Retic Fract: 14.7 % (ref 2.3–15.9)
RBC.: 4.46 MIL/uL (ref 3.87–5.11)
Retic Count, Absolute: 81.6 10*3/uL (ref 19.0–186.0)
Retic Ct Pct: 1.8 % (ref 0.4–3.1)
Reticulocyte Hemoglobin: 33.2 pg (ref >27.9)

## 2023-07-21 LAB — FERRITIN: Ferritin: 223 ng/mL (ref 11–307)

## 2023-07-21 LAB — SAVE SMEAR(SSMR), FOR PROVIDER SLIDE REVIEW

## 2023-07-21 MED ORDER — CEFDINIR 300 MG PO CAPS: 600.0000 mg | ORAL_CAPSULE | Freq: Every day | ORAL | 0 refills | Status: DC

## 2023-07-21 NOTE — Progress Notes (Signed)
 Hematology and Oncology Follow Up Visit  Tabitha Estrada [id number] 31-Aug-1970 53 y.o. 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    Past Therapy: Gleevec  400 mg po q day -- D/C'd on 06/15/2018 for periorbital edema  Sprycel  100 mg PO every other day (changed 08/04/2018) - d/c on 05/01/2019 Bosulif  200 mg po q day -- start on 05/07/2019 - patient stopped 06/26/2019 due to diarrhea   Past Therapy: Tasigna  50 mg po q day -- start in 11/2019   -- d/c on 04/09/2020 Scemblix   20 mg po day -- start on 02/19/2021 --DC due to intolerance Hydrea  500 mg po BID -- start on 04/02/2021   Current Therapy:        Ponatinib  15 mg po q day -- start on 05/08/2021 -- hold on 06/15/2021 - restart on 08/12/2021   Interim History:  Tabitha Estrada is here today for follow-up.  Unfortunately, she is having problems with COPD.  She says she was hospitalized a couple weeks ago.  She left AMA because she had no medical insurance.  She says her breathing might be a little bit better.  She was on a steroid taper.  She is getting I think inhalers.  I am not sure she is on any kind of nebulizer.  She is coughing up yellow-brown mucus.  We are to have to get her on some antibiotics in my opinion.  I will try her on some Omnicef  to see if this may help.  At least, her last BCR/ABL that we saw back in January was negative.  As such, she is in a major molecular remission.  She has had no fever.  She has had no bleeding.  She has had a little bit of leg swelling.  I told her that she could certainly double up on her torsemide  for 3 days and see if this may help get rid of some of the extra fluid in her legs.  I just feel bad for her given that she has these underlying lung issues.  Currently, I would have to say that her performance status is probably ECOG 1.   Wt Readings from Last 3 Encounters:  07/21/23 255 lb (115.7 kg)  03/24/23 258 lb 1.3 oz (117.1 kg)  09/29/22 248 lb 6.4 oz  (112.7 kg)     Medications:  Allergies as of 07/21/2023       Reactions   Dulaglutide Nausea And Vomiting, Other (See Comments)   Elemental Sulfur Nausea And Vomiting   Liraglutide Nausea And Vomiting   Metformin Diarrhea   Sulfamethoxazole Nausea And Vomiting        Medication List        Accurate as of Jul 21, 2023  1:55 PM. If you have any questions, ask your nurse or doctor.          acetaminophen  500 MG tablet Commonly known as: TYLENOL  Take 1,000 mg by mouth every 6 (six) hours as needed for moderate pain.   albuterol  108 (90 Base) MCG/ACT inhaler Commonly known as: VENTOLIN  HFA Inhale 1-2 puffs into the lungs every 4 (four) hours as needed for wheezing or shortness of breath.   albuterol  (2.5 MG/3ML) 0.083% nebulizer solution Commonly known as: PROVENTIL  Inhale the contents of one vial in nebulizer every four to six hours as needed for cough or wheeze.   Aspirin Low Dose 81 MG tablet Generic drug: aspirin EC Take 81 mg by mouth daily.   atorvastatin  80 MG  tablet Commonly known as: LIPITOR TAKE 1 TABLET (80 MG TOTAL) BY MOUTH DAILY. NEEDS LAB WORK   Baqsimi  One Pack 3 MG/DOSE Powd Generic drug: Glucagon  Place 1 spray into both nostrils as needed for low blood sugar.   budesonide -formoterol  160-4.5 MCG/ACT inhaler Commonly known as: SYMBICORT  Inhale 2 puffs into the lungs 2 (two) times daily.   colestipol  1 g tablet Commonly known as: COLESTID  Take 1 tablet (1 g total) by mouth every morning.   cyanocobalamin 1000 MCG tablet Commonly known as: VITAMIN B12 Take 1,000 mcg by mouth daily.   cyclobenzaprine  10 MG tablet Commonly known as: FLEXERIL  Take 10 mg by mouth daily as needed for muscle spasms.   Dexcom G6 Sensor Misc Apply topically.   diphenoxylate -atropine  2.5-0.025 MG tablet Commonly known as: LOMOTIL  Take 1 tablet by mouth 4 (four) times daily as needed for diarrhea or loose stools (as needed after taking Sprycel  for diarrhea).    ezetimibe 10 MG tablet Commonly known as: ZETIA Take by mouth.   fluconazole  150 MG tablet Commonly known as: Diflucan  Take 1 tablet (150 mg total) by mouth daily.   fluticasone  50 MCG/ACT nasal spray Commonly known as: FLONASE  Use two sprays in each nostril once daily as directed.   folic acid 1 MG tablet Commonly known as: FOLVITE Take 1 mg by mouth daily.   ibuprofen 200 MG tablet Commonly known as: ADVIL Take 600 mg by mouth every 6 (six) hours as needed for moderate pain.   insulin  aspart 100 UNIT/ML FlexPen Commonly known as: NovoLOG  FlexPen Inject 40 Units into the skin daily with supper. What changed:  how much to take when to take this additional instructions   ipratropium-albuterol  0.5-2.5 (3) MG/3ML Soln Commonly known as: DUONEB Inhale 3 mLs into the lungs every 6 (six) hours.   losartan 25 MG tablet Commonly known as: COZAAR Take 1 tablet by mouth daily.   meclizine  25 MG tablet Commonly known as: ANTIVERT  Take 1 tablet (25 mg total) by mouth 3 (three) times daily as needed for dizziness.   methocarbamol 500 MG tablet Commonly known as: ROBAXIN Take 500 mg by mouth 3 (three) times daily.   montelukast  10 MG tablet Commonly known as: SINGULAIR  TAKE 1 TABLET (10 MG TOTAL) BY MOUTH DAILY   mupirocin  ointment 2 % Commonly known as: BACTROBAN  Apply 1 application. topically daily.   omeprazole  40 MG capsule Commonly known as: PRILOSEC Take 40 mg by mouth 2 (two) times daily.   ondansetron  4 MG disintegrating tablet Commonly known as: ZOFRAN -ODT Take 1 tablet (4 mg total) by mouth every 8 (eight) hours as needed for nausea or vomiting.   OXYGEN Inhale into the lungs. Pt reports 2-3 lpm continuous.   PONATinib  HCl 15 MG tablet Commonly known as: ICLUSIG  Take 1 tablet (15 mg total) by mouth daily.   potassium chloride  20 MEQ packet Commonly known as: KLOR-CON  Take 20 mEq by mouth daily.   pregabalin  200 MG capsule Commonly known as:  LYRICA  Take 200 mg by mouth 2 (two) times daily.   torsemide  20 MG tablet Commonly known as: DEMADEX  Take 20 mg by mouth daily.   TOUJEO  SOLOSTAR [place] Inject 50 Units into the skin. If pump fails   Trelegy Ellipta 100-62.5-25 MCG/ACT Aepb Generic drug: Fluticasone -Umeclidin-Vilant Inhale 1 puff into the lungs daily.   valACYclovir  500 MG tablet Commonly known as: VALTREX  TAKE ONE TABLET BY MOUTH AT BEDTIME   valsartan  40 MG tablet Commonly known as: DIOVAN  Take 40 mg by mouth  daily.        Allergies:  Allergies  Allergen Reactions   Dulaglutide Nausea And Vomiting and Other (See Comments)   Elemental Sulfur Nausea And Vomiting   Liraglutide Nausea And Vomiting   Metformin Diarrhea   Sulfamethoxazole Nausea And Vomiting    Past Medical History, Surgical history, Social history, and Family History were reviewed and updated.  Review of Systems: Review of Systems  Constitutional:  Positive for malaise/fatigue.  HENT: Negative.    Eyes: Negative.   Respiratory:  Positive for cough, sputum production and shortness of breath.   Cardiovascular:  Positive for palpitations and leg swelling.  Gastrointestinal:  Positive for nausea.  Genitourinary: Negative.   Musculoskeletal:  Positive for myalgias and neck pain.  Skin: Negative.   Neurological:  Positive for headaches.  Endo/Heme/Allergies: Negative.   Psychiatric/Behavioral: Negative.       Physical Exam:  weight is 255 lb (115.7 kg). Her oral temperature is 98 F (36.7 C). Her blood pressure is 116/56 (abnormal) and her pulse is 68. Her respiration is 19 and oxygen saturation is 97%.   Wt Readings from Last 3 Encounters:  07/21/23 255 lb (115.7 kg)  03/24/23 258 lb 1.3 oz (117.1 kg)  09/29/22 248 lb 6.4 oz (112.7 kg)   Physical Exam Vitals reviewed.  HENT:     Head: Normocephalic and atraumatic.  Eyes:     Pupils: Pupils are equal, round, and reactive to light.  Cardiovascular:     Rate and Rhythm: Normal  rate and regular rhythm.     Heart sounds: Normal heart sounds.  Pulmonary:     Effort: Pulmonary effort is normal.     Breath sounds: Normal breath sounds.     Comments: Pulmonary exam shows good air movement bilaterally.  She may have a few expiratory wheezes. Abdominal:     General: Bowel sounds are normal.     Palpations: Abdomen is soft.  Musculoskeletal:        General: No tenderness or deformity. Normal range of motion.     Cervical back: Normal range of motion.     Comments: Extremities shows 1+ edema in the legs bilaterally.  Lymphadenopathy:     Cervical: No cervical adenopathy.  Skin:    General: Skin is warm and dry.     Findings: No erythema or rash.  Neurological:     Mental Status: She is alert and oriented to person, place, and time.  Psychiatric:        Behavior: Behavior normal.        Thought Content: Thought content normal.        Judgment: Judgment normal.     Lab Results  Component Value Date   WBC 8.6 07/21/2023   HGB 12.7 07/21/2023   HCT 40.1 07/21/2023   MCV 89.5 07/21/2023   PLT 125 (L) 07/21/2023   Lab Results  Component Value Date   FERRITIN 120 03/24/2023   IRON 63 03/24/2023   TIBC 315 03/24/2023   UIBC 252 03/24/2023   IRONPCTSAT 20 03/24/2023   Lab Results  Component Value Date   RETICCTPCT 2.2 03/24/2023   RBC 4.48 07/21/2023   No results found for: "KPAFRELGTCHN", "LAMBDASER", "KAPLAMBRATIO" Lab Results  Component Value Date   IGGSERUM 675 (L) 11/10/2016   IGMSERUM 86 11/10/2016   No results found for: "TOTALPROTELP", "ALBUMINELP", "A1GS", "A2GS", "BETS", "BETA2SER", "GAMS", "MSPIKE", "SPEI"   Chemistry      Component Value Date/Time   NA 142 03/24/2023 1201  NA 144 01/27/2017 1302   NA 138 09/16/2016 1105   K 4.5 03/24/2023 1201   K 4.3 01/27/2017 1302   K 4.2 09/16/2016 1105   CL 102 03/24/2023 1201   CL 102 01/27/2017 1302   CO2 31 03/24/2023 1201   CO2 27 01/27/2017 1302   CO2 26 09/16/2016 1105   BUN 16  03/24/2023 1201   BUN 14 01/27/2017 1302   BUN 11.9 09/16/2016 1105   CREATININE 1.15 (H) 03/24/2023 1201   CREATININE 1.1 01/27/2017 1302   CREATININE 0.8 09/16/2016 1105      Component Value Date/Time   CALCIUM  9.5 03/24/2023 1201   CALCIUM  9.5 01/27/2017 1302   CALCIUM  9.0 09/16/2016 1105   ALKPHOS 140 (H) 03/24/2023 1201   ALKPHOS 116 (H) 01/27/2017 1302   ALKPHOS 136 09/16/2016 1105   AST 5 (L) 03/24/2023 1201   AST 8 09/16/2016 1105   ALT 10 03/24/2023 1201   ALT 25 01/27/2017 1302   ALT 12 09/16/2016 1105   BILITOT 0.5 03/24/2023 1201   BILITOT 0.32 09/16/2016 1105      Impression and Plan: Ms. Diggins is a very pleasant 53 yo with chronic phase CML. She has had a hard time with all of the TKI medications.  She continues to tolerate Iclusig  nicely.   She is still in a MMR.  We will continue her on the Iclusig  for right now.  Maybe, we might think about getting her off at the end of the year if she does maintain a MMR.  I just feel bad about her lungs right now.  Hopefully, this will get better.  We will try to get her through the summertime.  I will see her back in September.    Ivor Mars, MD 5/22/20251:55 PM

## 2023-07-22 LAB — IRON AND IRON BINDING CAPACITY (CC-WL,HP ONLY)
Iron: 71 ug/dL (ref 28–170)
Saturation Ratios: 26 % (ref 10.4–31.8)
TIBC: 270 ug/dL (ref 250–450)
UIBC: 199 ug/dL (ref 148–442)

## 2023-07-27 ENCOUNTER — Ambulatory Visit: Payer: Self-pay | Admitting: Hematology & Oncology

## 2023-07-27 LAB — BCR-ABL1, CML/ALL, PCR, QUANT
E1A2 Transcript: <0.0032 %
Interpretation (BCRAL):: NEGATIVE
b2a2 transcript: <0.0032 %
b3a2 transcript: <0.0032 %

## 2023-08-04 ENCOUNTER — Other Ambulatory Visit: Payer: Self-pay | Admitting: *Deleted

## 2023-08-04 ENCOUNTER — Encounter: Payer: Self-pay | Admitting: Hematology & Oncology

## 2023-08-04 DIAGNOSIS — B3731 Acute candidiasis of vulva and vagina: Secondary | ICD-10-CM

## 2023-08-04 MED ORDER — FLUCONAZOLE 150 MG PO TABS
ORAL_TABLET | ORAL | 0 refills | Status: AC
Start: 2023-08-04 — End: ?

## 2023-11-24 ENCOUNTER — Other Ambulatory Visit: Payer: Self-pay

## 2023-11-24 ENCOUNTER — Ambulatory Visit: Payer: Self-pay | Admitting: Hematology & Oncology

## 2023-12-07 ENCOUNTER — Encounter: Payer: Self-pay | Admitting: Family

## 2023-12-08 ENCOUNTER — Inpatient Hospital Stay: Payer: Self-pay | Admitting: Hematology & Oncology

## 2023-12-08 ENCOUNTER — Inpatient Hospital Stay: Payer: Self-pay | Attending: Hematology & Oncology

## 2023-12-13 ENCOUNTER — Telehealth: Payer: Self-pay | Admitting: Hematology & Oncology

## 2023-12-13 NOTE — Telephone Encounter (Signed)
 LVM to r/s missed appt on10/09/25.

## 2024-01-16 ENCOUNTER — Other Ambulatory Visit (HOSPITAL_COMMUNITY): Payer: Self-pay

## 2024-02-03 ENCOUNTER — Inpatient Hospital Stay

## 2024-02-03 ENCOUNTER — Inpatient Hospital Stay: Admitting: Hematology & Oncology

## 2024-02-10 ENCOUNTER — Encounter: Payer: Self-pay | Admitting: Family

## 2024-02-10 ENCOUNTER — Other Ambulatory Visit (HOSPITAL_BASED_OUTPATIENT_CLINIC_OR_DEPARTMENT_OTHER): Payer: Self-pay

## 2024-02-10 ENCOUNTER — Inpatient Hospital Stay: Admitting: Family

## 2024-02-10 ENCOUNTER — Inpatient Hospital Stay: Attending: Hematology & Oncology

## 2024-02-10 ENCOUNTER — Other Ambulatory Visit: Payer: Self-pay | Admitting: *Deleted

## 2024-02-10 DIAGNOSIS — D509 Iron deficiency anemia, unspecified: Secondary | ICD-10-CM | POA: Insufficient documentation

## 2024-02-10 DIAGNOSIS — C9210 Chronic myeloid leukemia, BCR/ABL-positive, not having achieved remission: Secondary | ICD-10-CM | POA: Diagnosis present

## 2024-02-10 DIAGNOSIS — R112 Nausea with vomiting, unspecified: Secondary | ICD-10-CM

## 2024-02-10 DIAGNOSIS — E1165 Type 2 diabetes mellitus with hyperglycemia: Secondary | ICD-10-CM

## 2024-02-10 LAB — CBC WITH DIFFERENTIAL (CANCER CENTER ONLY)
Abs Immature Granulocytes: 0.02 K/uL (ref 0.00–0.07)
Basophils Absolute: 0 K/uL (ref 0.0–0.1)
Basophils Relative: 0 %
Eosinophils Absolute: 0 K/uL (ref 0.0–0.5)
Eosinophils Relative: 0 %
HCT: 42.1 % (ref 36.0–46.0)
Hemoglobin: 13.1 g/dL (ref 12.0–15.0)
Immature Granulocytes: 0 %
Lymphocytes Relative: 15 %
Lymphs Abs: 1.1 K/uL (ref 0.7–4.0)
MCH: 27.5 pg (ref 26.0–34.0)
MCHC: 31.1 g/dL (ref 30.0–36.0)
MCV: 88.4 fL (ref 80.0–100.0)
Monocytes Absolute: 0.3 K/uL (ref 0.1–1.0)
Monocytes Relative: 4 %
Neutro Abs: 5.7 K/uL (ref 1.7–7.7)
Neutrophils Relative %: 81 %
Platelet Count: 105 K/uL — ABNORMAL LOW (ref 150–400)
RBC: 4.76 MIL/uL (ref 3.87–5.11)
RDW: 16.1 % — ABNORMAL HIGH (ref 11.5–15.5)
WBC Count: 7.2 K/uL (ref 4.0–10.5)
nRBC: 0 % (ref 0.0–0.2)

## 2024-02-10 LAB — CMP (CANCER CENTER ONLY)
ALT: 12 U/L (ref 0–44)
AST: 14 U/L — ABNORMAL LOW (ref 15–41)
Albumin: 4 g/dL (ref 3.5–5.0)
Alkaline Phosphatase: 128 U/L — ABNORMAL HIGH (ref 38–126)
Anion gap: 9 (ref 5–15)
BUN: 16 mg/dL (ref 6–20)
CO2: 33 mmol/L — ABNORMAL HIGH (ref 22–32)
Calcium: 9.6 mg/dL (ref 8.9–10.3)
Chloride: 101 mmol/L (ref 98–111)
Creatinine: 1.11 mg/dL — ABNORMAL HIGH (ref 0.44–1.00)
GFR, Estimated: 60 mL/min — ABNORMAL LOW (ref >60)
Glucose, Bld: 178 mg/dL — ABNORMAL HIGH (ref 70–99)
Potassium: 4.8 mmol/L (ref 3.5–5.1)
Sodium: 143 mmol/L (ref 135–145)
Total Bilirubin: 0.5 mg/dL (ref 0.0–1.2)
Total Protein: 6.9 g/dL (ref 6.5–8.1)

## 2024-02-10 LAB — SAVE SMEAR(SSMR), FOR PROVIDER SLIDE REVIEW

## 2024-02-10 LAB — HEMOGLOBIN A1C
Hgb A1c MFr Bld: 9.4 % — ABNORMAL HIGH (ref 4.8–5.6)
Mean Plasma Glucose: 223.08 mg/dL

## 2024-02-10 LAB — LACTATE DEHYDROGENASE: LDH: 242 U/L — ABNORMAL HIGH (ref 105–235)

## 2024-02-10 MED ORDER — ONDANSETRON 4 MG PO TBDP
4.0000 mg | ORAL_TABLET | Freq: Three times a day (TID) | ORAL | 1 refills | Status: AC | PRN
  Filled 2024-02-10: qty 20, 7d supply, fill #0

## 2024-02-10 NOTE — Progress Notes (Signed)
 Hematology and Oncology Follow Up Visit  Tabitha Estrada [id number] April 19, 1970 53 y.o. 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 Tabitha Estrada is here today with her husband for follow-up. She is doing fairly well. The changes in temperature have triggered her congestion and cough off and on.  No fever, chills, rash, dizziness, chest pain, palpitations, abdominal pain or changes in bowel or bladder habits.  She has SOB with exertion and states that she sees pulmonology to discuss home O2 next week. She tries wearing her CPAP at night but states that it comes off.  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  She is taking her Iclusig   Minimal swelling in the ankles at this time.  Neuropathy in the feet comes and goes.  No falls or syncope reported.  She notes intermittent nausea.  Appetite and hydration are good. Weight is stable at 253 lbs.   ECOG Performance Status: 1 - Symptomatic but completely ambulatory  Medications:  Allergies as of 02/10/2024       Reactions   Dulaglutide Nausea And Vomiting, Other (See Comments)   Elemental Sulfur Nausea And Vomiting   Liraglutide Nausea And Vomiting   Metformin Diarrhea   Sulfamethoxazole Nausea And Vomiting        Medication List        Accurate as of February 10, 2024 10:43 AM. If you have any questions, ask your nurse or doctor.           acetaminophen  500 MG tablet Commonly known as: TYLENOL  Take 1,000 mg by mouth every 6 (six) hours as needed for moderate pain.   albuterol  108 (90 Base) MCG/ACT inhaler Commonly known as: VENTOLIN  HFA Inhale 1-2 puffs into the lungs every 4 (four) hours as needed for wheezing or shortness of breath.   albuterol  (2.5 MG/3ML) 0.083% nebulizer solution Commonly known as: PROVENTIL  Inhale the contents of one vial in nebulizer every four to six hours as needed for cough or wheeze.   Aspirin Low Dose 81 MG tablet Generic drug: aspirin EC Take 81 mg by mouth daily.   atorvastatin  80 MG tablet Commonly known as: LIPITOR TAKE 1 TABLET (80 MG TOTAL) BY MOUTH DAILY. NEEDS LAB WORK   Baqsimi  One Pack 3 MG/DOSE Powd Generic drug: Glucagon  Place 1 spray into both nostrils as needed for low blood sugar.   budesonide -formoterol  160-4.5 MCG/ACT inhaler Commonly known as: SYMBICORT  Inhale 2 puffs into the lungs 2 (two) times daily.   cefdinir  300 MG capsule Commonly known as: OMNICEF  Take 2 capsules (600 mg total) by mouth daily.   colestipol  1 g tablet Commonly known as: COLESTID  Take 1 tablet (1 g total) by mouth every morning.   cyanocobalamin 1000 MCG tablet Commonly known as: VITAMIN B12 Take 1,000 mcg by mouth  daily.   cyclobenzaprine  10 MG tablet Commonly known as: FLEXERIL  Take 10 mg by mouth daily as needed for muscle spasms.   Dexcom G6 Sensor Misc Apply topically.   diphenoxylate -atropine  2.5-0.025 MG tablet Commonly known as: LOMOTIL  Take 1 tablet by mouth 4 (four) times daily as needed for diarrhea or loose stools (as needed after taking Sprycel  for diarrhea).   ezetimibe 10 MG tablet Commonly known as: ZETIA Take by mouth.   fluconazole  150 MG tablet Commonly known as: Diflucan  Take one tablet by mouth one week apart.   fluticasone  50 MCG/ACT nasal spray Commonly known as: FLONASE  Use two sprays in each nostril once daily as directed.   folic acid 1 MG  tablet Commonly known as: FOLVITE Take 1 mg by mouth daily.   ibuprofen 200 MG tablet Commonly known as: ADVIL Take 600 mg by mouth every 6 (six) hours as needed for moderate pain.   insulin  aspart 100 UNIT/ML FlexPen Commonly known as: NovoLOG  FlexPen Inject 40 Units into the skin daily with supper. What changed:  how much to take when to take this additional instructions   ipratropium-albuterol  0.5-2.5 (3) MG/3ML Soln Commonly known as: DUONEB Inhale 3 mLs into the lungs every 6 (six) hours.   losartan 25 MG tablet Commonly known as: COZAAR Take 1 tablet by mouth daily.   meclizine  25 MG tablet Commonly known as: ANTIVERT  Take 1 tablet (25 mg total) by mouth 3 (three) times daily as needed for dizziness.   methocarbamol 500 MG tablet Commonly known as: ROBAXIN Take 500 mg by mouth 3 (three) times daily.   montelukast  10 MG tablet Commonly known as: SINGULAIR  TAKE 1 TABLET (10 MG TOTAL) BY MOUTH DAILY   mupirocin  ointment 2 % Commonly known as: BACTROBAN  Apply 1 application. topically daily.   omeprazole  40 MG capsule Commonly known as: PRILOSEC Take 40 mg by mouth 2 (two) times daily.   ondansetron  4 MG disintegrating tablet Commonly known as: ZOFRAN -ODT Take 1 tablet (4 mg total) by mouth every 8 (eight) hours as needed for nausea or vomiting.   OXYGEN Inhale into the lungs. Pt reports 2-3 lpm continuous.   PONATinib  HCl 15 MG tablet Commonly known as: ICLUSIG  Take 1 tablet (15 mg total) by mouth daily.   potassium chloride  20 MEQ packet Commonly known as: KLOR-CON  Take 20 mEq by mouth daily.   pregabalin  200 MG capsule Commonly known as: LYRICA  Take 200 mg by mouth 2 (two) times daily.   torsemide  20 MG tablet Commonly known as: DEMADEX  Take 20 mg by mouth daily.   TOUJEO  SOLOSTAR [facility] Inject 50 Units into the skin. If pump fails   Trelegy Ellipta 100-62.5-25 MCG/ACT Aepb Generic drug: Fluticasone -Umeclidin-Vilant Inhale 1 puff into the lungs  daily.   valACYclovir  500 MG tablet Commonly known as: VALTREX  TAKE ONE TABLET BY MOUTH AT BEDTIME   valsartan  40 MG tablet Commonly known as: DIOVAN  Take 40 mg by mouth daily.        Allergies: Allergies[1]  Past Medical History, Surgical history, Social history, and Family History were reviewed and updated.  Review of Systems: All other 10 point review of systems is negative.   Physical Exam:  vitals were not taken for this visit.   Wt Readings from Last 3 Encounters:  07/21/23 255 lb (115.7 kg)  03/24/23 258 lb 1.3 oz (117.1 kg)  09/29/22 248 lb 6.4 oz (112.7 kg)    Ocular: Sclerae unicteric, pupils equal, round and reactive to light Ear-nose-throat: Oropharynx clear, dentition  fair Lymphatic: No cervical or supraclavicular adenopathy Lungs no rales or rhonchi, good excursion bilaterally Heart regular rate and rhythm, no murmur appreciated Abd soft, nontender, positive bowel sounds MSK no focal spinal tenderness, no joint edema Neuro: non-focal, well-oriented, appropriate affect Breasts: Deferred   Lab Results  Component Value Date   WBC 7.2 02/10/2024   HGB 13.1 02/10/2024   HCT 42.1 02/10/2024   MCV 88.4 02/10/2024   PLT PENDING 02/10/2024   Lab Results  Component Value Date   FERRITIN 223 07/21/2023   IRON 71 07/21/2023   TIBC 270 07/21/2023   UIBC 199 07/21/2023   IRONPCTSAT 26 07/21/2023   Lab Results  Component Value Date   RETICCTPCT 1.8 07/21/2023   RBC 4.76 02/10/2024   No results found for: KPAFRELGTCHN, LAMBDASER, [facility] Lab Results  Component Value Date   IGGSERUM 675 (L) 11/10/2016   IGMSERUM 86 11/10/2016   No results found for: STEPHANY CARLOTA BENSON MARKEL EARLA JOANNIE DOC VICK, SPEI   Chemistry      Component Value Date/Time   NA 141 07/21/2023 1320   NA 144 01/27/2017 1302   NA 138 09/16/2016 1105   K 4.6 07/21/2023 1320   K 4.3 01/27/2017 1302   K 4.2 09/16/2016 1105   CL  100 07/21/2023 1320   CL 102 01/27/2017 1302   CO2 32 07/21/2023 1320   CO2 27 01/27/2017 1302   CO2 26 09/16/2016 1105   BUN 12 07/21/2023 1320   BUN 14 01/27/2017 1302   BUN 11.9 09/16/2016 1105   CREATININE 1.18 (H) 07/21/2023 1320   CREATININE 1.1 01/27/2017 1302   CREATININE 0.8 09/16/2016 1105      Component Value Date/Time   CALCIUM  8.8 (L) 07/21/2023 1320   CALCIUM  9.5 01/27/2017 1302   CALCIUM  9.0 09/16/2016 1105   ALKPHOS 105 07/21/2023 1320   ALKPHOS 116 (H) 01/27/2017 1302   ALKPHOS 136 09/16/2016 1105   AST 7 (L) 07/21/2023 1320   AST 8 09/16/2016 1105   ALT 20 07/21/2023 1320   ALT 25 01/27/2017 1302   ALT 12 09/16/2016 1105   BILITOT 0.8 07/21/2023 1320   BILITOT 0.32 09/16/2016 1105       Impression and Plan: Ms. Gattuso is a very pleasant 53 yo with chronic phase CML. She has had a hard time with all of the TKI medications.  She continues to tolerate Iclusig  nicely.  So far she has done well and has remained in MMR.  If she remains in MMR with this BCR/ABL we will consider giving her break from the Iclusig .  Follow-up in 6 months.   Lauraine Pepper, NP 12/12/202510:43 AM     [1]  Allergies Allergen Reactions   Dulaglutide Nausea And Vomiting and Other (See Comments)   Elemental Sulfur Nausea And Vomiting   Liraglutide Nausea And Vomiting   Metformin Diarrhea   Sulfamethoxazole Nausea And Vomiting

## 2024-02-15 ENCOUNTER — Other Ambulatory Visit (HOSPITAL_COMMUNITY): Payer: Self-pay

## 2024-02-16 ENCOUNTER — Encounter: Payer: Self-pay | Admitting: Family

## 2024-02-17 ENCOUNTER — Telehealth: Payer: Self-pay

## 2024-02-17 ENCOUNTER — Other Ambulatory Visit (HOSPITAL_COMMUNITY): Payer: Self-pay

## 2024-02-17 NOTE — Telephone Encounter (Signed)
 Oral Oncology Patient Advocate Encounter   Received notification that prior authorization for Iclusig  is due for renewal.   PA submitted on 02/17/2024 Key [id number] Status is pending      Charlott Hamilton,  CPhT-Adv  she/her/hers [facility]  [facility] Pharmacy Technician Patient Advocate Specialist III [facility] Phone: [phone]  Fax: [phone] [email or web address]

## 2024-02-18 LAB — BCR-ABL1, CML/ALL, PCR, QUANT
E1A2 Transcript: <0.0032 %
b2a2 transcript: <0.0032 %
b3a2 transcript: 0.0249 %

## 2024-02-20 ENCOUNTER — Other Ambulatory Visit (HOSPITAL_COMMUNITY): Payer: Self-pay

## 2024-02-20 ENCOUNTER — Telehealth: Payer: Self-pay

## 2024-02-20 ENCOUNTER — Encounter: Payer: Self-pay | Admitting: Family

## 2024-02-20 NOTE — Telephone Encounter (Signed)
 Oral Oncology Patient Advocate Encounter   Was successful in obtaining a copay card for Iclusig .  This copay card will make the patients copay $0.00.   The billing information is as follows and has been shared with [facility].   RxBin: [id number] PCN: [id number] Member ID: [id number] Group ID: [id number]    Charlott Hamilton,  CPhT-Adv  she/her/hers [facility]  [facility] Specialty Pharmacy Services Pharmacy Technician Patient Advocate Specialist III [facility] Phone: [phone]  Fax: [phone] [email or web address]

## 2024-02-20 NOTE — Telephone Encounter (Signed)
 Oral Oncology Patient Advocate Encounter  Prior Authorization for Iclusig  has been approved.    PA# [id number] Effective dates: 02/17/2024 through 02/16/2025  Patients co-pay is $30.00     Charlott Hamilton,  CPhT-Adv  she/her/hers [facility]  [facility] Pharmacy Technician Patient Advocate Specialist III [facility] Phone: [phone]  Fax: [phone] [email or web address]

## 2024-02-21 ENCOUNTER — Other Ambulatory Visit: Payer: Self-pay

## 2024-02-21 ENCOUNTER — Other Ambulatory Visit: Payer: Self-pay | Admitting: *Deleted

## 2024-02-21 DIAGNOSIS — C9210 Chronic myeloid leukemia, BCR/ABL-positive, not having achieved remission: Secondary | ICD-10-CM

## 2024-02-21 MED ORDER — PONATINIB HCL 15 MG PO TABS
15.0000 mg | ORAL_TABLET | Freq: Every day | ORAL | 6 refills | Status: AC
  Filled 2024-02-21: qty 15, 15d supply, fill #0
  Filled 2024-03-14 – 2024-04-03 (×2): qty 15, 15d supply, fill #1

## 2024-02-21 NOTE — Progress Notes (Signed)
" °  Specialty Pharmacy Initial Fill Coordination Note  Tabitha Estrada is a 53 y.o. female contacted today regarding refills of specialty medication(s) PONATinib  HCl (ICLUSIG ) .  Patient requested Delivery  on 02/24/24  to verified address [address] [place] [address]   Medication will be filled on 02/21/2024.   Patient is aware of $0.00 copayment with copay card   "

## 2024-02-21 NOTE — Progress Notes (Signed)
 Oral Chemotherapy Pharmacist Encounter  Patient was counseled under telephone encounter from 04/30/2021.  Asberry Macintosh, PharmD, BCPS, BCOP Hematology/Oncology Clinical Pharmacist [phone] 02/21/2024 2:12 PM

## 2024-02-22 ENCOUNTER — Other Ambulatory Visit: Payer: Self-pay

## 2024-03-06 ENCOUNTER — Other Ambulatory Visit: Payer: Self-pay

## 2024-03-14 ENCOUNTER — Other Ambulatory Visit: Payer: Self-pay

## 2024-03-16 ENCOUNTER — Other Ambulatory Visit: Payer: Self-pay

## 2024-03-20 ENCOUNTER — Other Ambulatory Visit: Payer: Self-pay

## 2024-03-20 ENCOUNTER — Other Ambulatory Visit (HOSPITAL_COMMUNITY): Payer: Self-pay

## 2024-03-28 ENCOUNTER — Other Ambulatory Visit (HOSPITAL_COMMUNITY): Payer: Self-pay

## 2024-03-29 ENCOUNTER — Other Ambulatory Visit: Payer: Self-pay

## 2024-04-03 ENCOUNTER — Other Ambulatory Visit (HOSPITAL_COMMUNITY): Payer: Self-pay

## 2024-04-05 ENCOUNTER — Other Ambulatory Visit (HOSPITAL_COMMUNITY): Payer: Self-pay

## 2024-08-10 ENCOUNTER — Inpatient Hospital Stay: Admitting: Family

## 2024-08-10 ENCOUNTER — Inpatient Hospital Stay
